# Patient Record
Sex: Female | Born: 1960
Health system: Southern US, Community
[De-identification: ages and names within clinical notes are randomized; demographics above are authoritative.]

## PROBLEM LIST (undated history)

## (undated) DIAGNOSIS — K760 Fatty (change of) liver, not elsewhere classified: Secondary | ICD-10-CM

## (undated) DIAGNOSIS — K5790 Diverticulosis of intestine, part unspecified, without perforation or abscess without bleeding: Secondary | ICD-10-CM

## (undated) DIAGNOSIS — T7840XA Allergy, unspecified, initial encounter: Secondary | ICD-10-CM

## (undated) DIAGNOSIS — E039 Hypothyroidism, unspecified: Secondary | ICD-10-CM

## (undated) DIAGNOSIS — K219 Gastro-esophageal reflux disease without esophagitis: Secondary | ICD-10-CM

## (undated) DIAGNOSIS — F32A Depression, unspecified: Secondary | ICD-10-CM

## (undated) DIAGNOSIS — E785 Hyperlipidemia, unspecified: Secondary | ICD-10-CM

## (undated) DIAGNOSIS — F419 Anxiety disorder, unspecified: Secondary | ICD-10-CM

## (undated) DIAGNOSIS — R7301 Impaired fasting glucose: Principal | ICD-10-CM

## (undated) DIAGNOSIS — F329 Major depressive disorder, single episode, unspecified: Secondary | ICD-10-CM

## (undated) DIAGNOSIS — I10 Essential (primary) hypertension: Secondary | ICD-10-CM

## (undated) DIAGNOSIS — M1990 Unspecified osteoarthritis, unspecified site: Secondary | ICD-10-CM

## (undated) DIAGNOSIS — R7303 Prediabetes: Secondary | ICD-10-CM

## (undated) DIAGNOSIS — K210 Gastro-esophageal reflux disease with esophagitis: Secondary | ICD-10-CM

## (undated) DIAGNOSIS — Z7982 Long term (current) use of aspirin: Secondary | ICD-10-CM

## (undated) HISTORY — DX: Diverticulosis of intestine, part unspecified, without perforation or abscess without bleeding: K57.90

## (undated) HISTORY — DX: Allergy, unspecified, initial encounter: T78.40XA

## (undated) HISTORY — DX: Gastro-esophageal reflux disease with esophagitis: K21.0

## (undated) HISTORY — PX: APPENDECTOMY: SHX54

## (undated) HISTORY — PX: JOINT REPLACEMENT: SHX530

## (undated) HISTORY — DX: Impaired fasting glucose: R73.01

## (undated) HISTORY — PX: THYROIDECTOMY: SHX17

## (undated) HISTORY — PX: BREAST CYST ASPIRATION: SHX578

---

## 2004-08-12 ENCOUNTER — Ambulatory Visit: Payer: Self-pay

## 2005-08-29 ENCOUNTER — Ambulatory Visit: Payer: Self-pay

## 2006-09-07 ENCOUNTER — Ambulatory Visit: Payer: Self-pay

## 2007-07-19 HISTORY — PX: ABDOMINAL HYSTERECTOMY: SHX81

## 2007-09-27 ENCOUNTER — Ambulatory Visit: Payer: Self-pay

## 2008-04-01 ENCOUNTER — Other Ambulatory Visit: Payer: Self-pay

## 2008-04-01 ENCOUNTER — Ambulatory Visit: Payer: Self-pay | Admitting: Obstetrics and Gynecology

## 2008-04-07 ENCOUNTER — Inpatient Hospital Stay: Payer: Self-pay | Admitting: Obstetrics and Gynecology

## 2008-07-18 DIAGNOSIS — K5790 Diverticulosis of intestine, part unspecified, without perforation or abscess without bleeding: Secondary | ICD-10-CM

## 2008-07-18 DIAGNOSIS — K2100 Gastro-esophageal reflux disease with esophagitis, without bleeding: Secondary | ICD-10-CM

## 2008-07-18 HISTORY — DX: Gastro-esophageal reflux disease with esophagitis, without bleeding: K21.00

## 2008-07-18 HISTORY — DX: Diverticulosis of intestine, part unspecified, without perforation or abscess without bleeding: K57.90

## 2008-10-24 ENCOUNTER — Ambulatory Visit: Payer: Self-pay | Admitting: Internal Medicine

## 2008-10-29 ENCOUNTER — Ambulatory Visit: Payer: Self-pay | Admitting: Internal Medicine

## 2009-05-06 ENCOUNTER — Ambulatory Visit: Payer: Self-pay | Admitting: Cardiovascular Disease

## 2009-05-07 ENCOUNTER — Ambulatory Visit: Payer: Self-pay | Admitting: Cardiovascular Disease

## 2009-09-07 ENCOUNTER — Ambulatory Visit: Payer: Self-pay | Admitting: Gastroenterology

## 2009-09-17 ENCOUNTER — Ambulatory Visit: Payer: Self-pay | Admitting: Internal Medicine

## 2009-12-23 ENCOUNTER — Other Ambulatory Visit: Payer: Self-pay | Admitting: Internal Medicine

## 2010-01-28 LAB — HM COLONOSCOPY

## 2010-04-01 ENCOUNTER — Encounter: Payer: Self-pay | Admitting: Physician Assistant

## 2010-06-24 ENCOUNTER — Ambulatory Visit: Payer: Self-pay | Admitting: Chiropractic Medicine

## 2010-07-07 ENCOUNTER — Ambulatory Visit: Payer: Self-pay | Admitting: Internal Medicine

## 2010-07-18 HISTORY — PX: JOINT REPLACEMENT: SHX530

## 2010-08-18 ENCOUNTER — Ambulatory Visit: Payer: Self-pay | Admitting: Pain Medicine

## 2010-08-26 ENCOUNTER — Ambulatory Visit: Payer: Self-pay | Admitting: Pain Medicine

## 2010-09-30 ENCOUNTER — Ambulatory Visit: Payer: Self-pay

## 2010-11-08 ENCOUNTER — Ambulatory Visit: Payer: Self-pay | Admitting: General Practice

## 2010-11-18 ENCOUNTER — Ambulatory Visit: Payer: Self-pay | Admitting: Internal Medicine

## 2010-11-22 ENCOUNTER — Inpatient Hospital Stay: Payer: Self-pay | Admitting: General Practice

## 2010-11-24 LAB — PATHOLOGY REPORT

## 2011-01-27 ENCOUNTER — Other Ambulatory Visit: Payer: Self-pay | Admitting: Internal Medicine

## 2011-05-06 ENCOUNTER — Other Ambulatory Visit: Payer: Self-pay | Admitting: *Deleted

## 2011-05-06 MED ORDER — ESCITALOPRAM OXALATE 20 MG PO TABS: 20.0000 mg | ORAL_TABLET | Freq: Every day | ORAL | Status: DC

## 2011-05-06 NOTE — Telephone Encounter (Signed)
Patient is asking for a 90 day supply.

## 2011-10-13 ENCOUNTER — Telehealth: Payer: Self-pay | Admitting: Internal Medicine

## 2011-10-13 NOTE — Telephone Encounter (Signed)
Since I have not seen her at the new office yet and do not have access to her old records, can you ask  her to tell me what the referral is for:  Diabetes? Overweight?  We also do not have an approiprate referral order  in the EPIC templates for this so we will need the [facility] to send [place] their blank referral forms. If they have them

## 2011-10-13 NOTE — Telephone Encounter (Signed)
[phone] Pt called stating she wanting to start going to a nurtinist @ [facility].  They told her they needed an order from you before they could see her.  Can you do a referral to the [facility]

## 2011-10-13 NOTE — Telephone Encounter (Signed)
Left message asking patient to call me back

## 2011-10-19 ENCOUNTER — Telehealth: Payer: Self-pay | Admitting: *Deleted

## 2011-10-19 NOTE — Telephone Encounter (Signed)
Opened in error

## 2011-10-19 NOTE — Telephone Encounter (Signed)
Patient notified. She is asking of this referral because she is overweight. Also the [facility] is going to be faxing over the referral form.

## 2011-11-25 ENCOUNTER — Other Ambulatory Visit: Payer: Self-pay | Admitting: Internal Medicine

## 2011-11-25 MED ORDER — CITALOPRAM HYDROBROMIDE 40 MG PO TABS: 40.0000 mg | ORAL_TABLET | Freq: Every day | ORAL | Status: DC

## 2011-11-25 MED ORDER — SPIRONOLACTONE 50 MG PO TABS: 50.0000 mg | ORAL_TABLET | Freq: Every day | ORAL | Status: DC | PRN

## 2011-12-15 ENCOUNTER — Ambulatory Visit: Payer: Self-pay | Admitting: Internal Medicine

## 2011-12-17 ENCOUNTER — Ambulatory Visit: Payer: Self-pay | Admitting: Internal Medicine

## 2011-12-29 ENCOUNTER — Ambulatory Visit: Payer: Self-pay | Admitting: Internal Medicine

## 2012-01-09 ENCOUNTER — Encounter: Payer: Self-pay | Admitting: Internal Medicine

## 2012-01-25 ENCOUNTER — Ambulatory Visit (INDEPENDENT_AMBULATORY_CARE_PROVIDER_SITE_OTHER): Payer: Self-pay | Admitting: Internal Medicine

## 2012-01-25 ENCOUNTER — Encounter: Payer: Self-pay | Admitting: Internal Medicine

## 2012-01-25 VITALS — BP 118/64 | HR 66 | Temp 97.8°F | Resp 16 | Ht 70.5 in | Wt 270.0 lb

## 2012-01-25 DIAGNOSIS — R197 Diarrhea, unspecified: Secondary | ICD-10-CM | POA: Insufficient documentation

## 2012-01-25 DIAGNOSIS — Z0000 Encounter for general adult medical examination without abnormal findings: Secondary | ICD-10-CM

## 2012-01-25 DIAGNOSIS — Z716 Tobacco abuse counseling: Secondary | ICD-10-CM

## 2012-01-25 DIAGNOSIS — R7303 Prediabetes: Secondary | ICD-10-CM | POA: Insufficient documentation

## 2012-01-25 DIAGNOSIS — Z87891 Personal history of nicotine dependence: Secondary | ICD-10-CM | POA: Insufficient documentation

## 2012-01-25 DIAGNOSIS — F17200 Nicotine dependence, unspecified, uncomplicated: Secondary | ICD-10-CM

## 2012-01-25 DIAGNOSIS — Z7189 Other specified counseling: Secondary | ICD-10-CM

## 2012-01-25 DIAGNOSIS — K5790 Diverticulosis of intestine, part unspecified, without perforation or abscess without bleeding: Secondary | ICD-10-CM

## 2012-01-25 DIAGNOSIS — R7301 Impaired fasting glucose: Secondary | ICD-10-CM

## 2012-01-25 DIAGNOSIS — K5730 Diverticulosis of large intestine without perforation or abscess without bleeding: Secondary | ICD-10-CM

## 2012-01-25 DIAGNOSIS — E669 Obesity, unspecified: Secondary | ICD-10-CM

## 2012-01-25 DIAGNOSIS — K2100 Gastro-esophageal reflux disease with esophagitis, without bleeding: Secondary | ICD-10-CM | POA: Insufficient documentation

## 2012-01-25 DIAGNOSIS — Z720 Tobacco use: Secondary | ICD-10-CM | POA: Insufficient documentation

## 2012-01-25 DIAGNOSIS — E119 Type 2 diabetes mellitus without complications: Secondary | ICD-10-CM

## 2012-01-25 HISTORY — DX: Impaired fasting glucose: R73.01

## 2012-01-25 MED ORDER — ALPRAZOLAM 0.5 MG PO TABS: 0.5000 mg | ORAL_TABLET | Freq: Three times a day (TID) | ORAL | Status: DC | PRN

## 2012-01-25 MED ORDER — DEXLANSOPRAZOLE 60 MG PO CPDR: 60.0000 mg | DELAYED_RELEASE_CAPSULE | Freq: Every day | ORAL | Status: DC

## 2012-01-25 NOTE — Assessment & Plan Note (Signed)
The patient was counseled on the dangers of tobacco use, and was advised to quit.  Reviewed strategies to maximize success, including removing cigarettes and smoking materials from environment, stress management, substitution of other forms of reinforcement and support of family/friends.

## 2012-01-25 NOTE — Assessment & Plan Note (Signed)
Pelvic and breast exam  Deferred per patient preference.  Mammogram normal June 2013.

## 2012-01-25 NOTE — Progress Notes (Signed)
Patient ID: Lisa Valencia, female   DOB: 1960-10-05, 51 y.o.   MRN: [id number] Last seen after her total hip replacement  In May 2012 by Dr. Ernest Pine.  All hip pain resolved. Limp resolved after several weeks of PT using a trainer at the [facility] gym.  Continues to work out with [name] at [employer] who addressed all of her issues.  Last PAP 2011,   No current issues,  Wants to defer PAP until next year.  Fasting labs done recently a1c was high at 6. 4 in November but she had been on steroids, and triglycerides and aLT were elevated.  She was not fasting when the lipids were drawn.  Repeated labs in November were also abnormal.  She has gained weight .   Subjective:     Lisa Valencia is a 51 y.o. female and is here for a comprehensive physical exam. The patient reports problems - with cholesterol and metabolic profile.  History   Social History  . Marital Status: Married    Spouse Name: N/A    Number of Children: N/A  . Years of Education: N/A   Occupational History  . Not on file.   Social History Main Topics  . Smoking status: Current Everyday Smoker    Types: Cigarettes  . Smokeless tobacco: Never Used  . Alcohol Use: Yes  . Drug Use: No  . Sexually Active: Not on file   Other Topics Concern  . Not on file   Social History Narrative  . No narrative on file   Health Maintenance  Topic Date Due  . Pap Smear  01/04/1979  . Tetanus/tdap  01/04/1980  . Colonoscopy  01/04/2011  . Influenza Vaccine  04/17/2012  . Mammogram  12/28/2013    The following portions of the patient's history were reviewed and updated as appropriate: allergies, current medications, past family history, past medical history, past social history, past surgical history and problem list.  Review of Systems A comprehensive review of systems was negative.   Objective:    BP 118/64  Pulse 66  Temp 97.8 F (36.6 C) (Oral)  Resp 16  Ht 5' 10.5" (1.791 m)  Wt 270 lb (122.471 kg)  BMI 38.19 kg/m2  SpO2  97% General appearance: alert, cooperative and appears stated age Head: Normocephalic, without obvious abnormality, atraumatic Eyes: conjunctivae/corneas clear. PERRL, EOM's intact. Fundi benign. Neck: no adenopathy, no carotid bruit, no JVD, supple, symmetrical, trachea midline and thyroid not enlarged, symmetric, no tenderness/mass/nodules Lungs: clear to auscultation bilaterally Heart: regular rate and rhythm, S1, S2 normal, no murmur, click, rub or gallop Abdomen: soft, non-tender; bowel sounds normal; no masses,  no organomegaly Extremities: extremities normal, atraumatic, no cyanosis or edema Pulses: 2+ and symmetric Skin: Skin color, texture, turgor normal. No rashes or lesions    Assessment:   Obesity (BMI 30-39.9) I have addressed  BMI and recommended a low glycemic index diet utilizing smaller more frequent meals to increase metabolism.  I have also recommended that patient continue exercising with a goal of 30 minutes of aerobic exercise a minimum of 5 days per week. tjhyroid function is normal.  Hgba1c and lipids are elevated and will be repeated in 3 months done today.    Tobacco abuse She had an allergic reaction  to wellbutrin on prior cessation attempt.  Prior trial of chantix was transiently successful  helped but she had a psychiatric decompensation reaction . She has tried trasnderaml nicotine.  But not the cigarette.  Husband smokes  Tobacco abuse counseling The patient was counseled on the dangers of tobacco use, and was advised to quit.  Reviewed strategies to maximize success, including removing cigarettes and smoking materials from environment, stress management, substitution of other forms of reinforcement and support of family/friends.  Diverticulosis of intestine With history concerning for recent episode, now resolved.   Esophagitis, reflux resume Dexilant for esophagitis seen on EGD.   Diabetes mellitus type 2, diet-controlled Advised to see optometry for  eye exam.  Foot check normal today . Repeat A1c in 3 months with urine test as well.   Routine general medical examination at a health care facility Pelvic and breast exam  Deferred per patient preference.  Mammogram normal June 2013.     Updated Medication List Outpatient Encounter Prescriptions as of 01/25/2012  Medication Sig Dispense Refill  . ALPRAZolam (XANAX) 0.5 MG tablet Take 1 tablet (0.5 mg total) by mouth 3 (three) times daily as needed for anxiety.  90 tablet  3  . aspirin 81 MG tablet Take 81 mg by mouth daily.      . citalopram (CELEXA) 40 MG tablet Take 1 tablet (40 mg total) by mouth daily.  30 tablet  3  . Multiple Vitamin (MULTIVITAMIN) tablet Take 1 tablet by mouth daily.      . ranitidine (ZANTAC) 150 MG capsule Take 150 mg by mouth 2 (two) times daily.      Marland Kitchen spironolactone (ALDACTONE) 50 MG tablet Take 1 tablet (50 mg total) by mouth daily as needed.  90 tablet  2  . DISCONTD: ALPRAZolam (XANAX) 0.5 MG tablet Take 0.5 mg by mouth at bedtime as needed.      Marland Kitchen dexlansoprazole (DEXILANT) 60 MG capsule Take 1 capsule (60 mg total) by mouth daily.  30 capsule  11  . DISCONTD: escitalopram (LEXAPRO) 20 MG tablet Take 1 tablet (20 mg total) by mouth daily.  90 tablet  2

## 2012-01-25 NOTE — Assessment & Plan Note (Signed)
Advised to see optometry for eye exam.  Foot check normal today . Repeat A1c in 3 months with urine test as well.

## 2012-01-25 NOTE — Assessment & Plan Note (Signed)
resume Dexilant for esophagitis seen on EGD.

## 2012-01-25 NOTE — Assessment & Plan Note (Signed)
I have addressed  BMI and recommended a low glycemic index diet utilizing smaller more frequent meals to increase metabolism.  I have also recommended that patient continue exercising with a goal of 30 minutes of aerobic exercise a minimum of 5 days per week. tjhyroid function is normal.  Hgba1c and lipids are elevated and will be repeated in 3 months done today.

## 2012-01-25 NOTE — Assessment & Plan Note (Signed)
She had an allergic reaction  to wellbutrin on prior cessation attempt.  Prior trial of chantix was transiently successful  helped but she had a psychiatric decompensation reaction . She has tried trasnderaml nicotine.  But not the cigarette.  Husband smokes

## 2012-01-25 NOTE — Assessment & Plan Note (Signed)
With history concerning for recent episode, now resolved.

## 2012-01-25 NOTE — Patient Instructions (Addendum)
Consider the Low Glycemic Index Diet and 6 smaller meals daily .  This boosts your metabolism and regulates your sugars:   7 AM Low carbohydrate Protein  Shakes (EAS Carb Control  Or Atkins ,  Available everywhere,   In  cases at BJs )  2.5 carbs  (Add or substitute a toasted sandwhich thin w/ peanut butter)  10 AM: Protein bar by Atkins (snack size,  Chocolate lover's variety at  BJ's)    Lunch: sandwich on pita bread or flatbread (Joseph's makes a pita bread and a flat bread , available at [employer] and BJ's; Toufayan makes a low carb flatbread available at [employer] and [employer]) Mission makes a low carb whole wheat tortilla available at [employer] most grocery stores   3 PM:  Mid day :  Another protein bar,  Or a  cheese stick, 1/4 cup of almonds, walnuts, pistachios, pecans, peanuts,  Macadamia nuts  6 PM  Dinner:  "mean and green:"  Meat/chicken/fish, salad, and green veggie : use ranch, vinagrette,  Blue cheese, etc  9 PM snack : Breyer's low carb fudgsicle or  ice cream bar (Carb Smart), or  Weight Watcher's ice cream bar , or another protein shake  Make substitution 15 carbs or less.  Treat pineapple as dessert, also banans and watermelon.  Cherries and berries are fine low glycemic index  Repeat you A1c, CMET and fasting lipids in 3 months.

## 2012-01-26 ENCOUNTER — Other Ambulatory Visit: Payer: Self-pay | Admitting: Internal Medicine

## 2012-01-26 MED ORDER — SPIRONOLACTONE 50 MG PO TABS: 50.0000 mg | ORAL_TABLET | Freq: Every day | ORAL | Status: DC | PRN

## 2012-01-26 MED ORDER — CITALOPRAM HYDROBROMIDE 40 MG PO TABS: 40.0000 mg | ORAL_TABLET | Freq: Every day | ORAL | Status: DC

## 2012-02-02 ENCOUNTER — Telehealth: Payer: Self-pay | Admitting: *Deleted

## 2012-02-02 NOTE — Telephone Encounter (Signed)
Pt was told by pharmacy that she needs PA completed for Dexilant. Will contact pharmacy for them to fax over information to start PA.

## 2012-02-03 NOTE — Telephone Encounter (Signed)
Called [facility] pharmacy to have them refax PA information for Dexilant to office.

## 2012-02-15 ENCOUNTER — Telehealth: Payer: Self-pay | Admitting: Internal Medicine

## 2012-02-15 NOTE — Telephone Encounter (Signed)
Patient came in today checking on prior autho for her dexialnt Pt is out of meds and would like to get sample if possbile  [facility] Please advise status

## 2012-02-15 NOTE — Telephone Encounter (Signed)
Prior auth form requested and received it is in the red folder waiting for signature.

## 2012-05-17 ENCOUNTER — Other Ambulatory Visit: Payer: Self-pay | Admitting: Internal Medicine

## 2012-05-17 LAB — COMPREHENSIVE METABOLIC PANEL
Albumin: 4.1 g/dL (ref 3.4–5.0)
Alkaline Phosphatase: 60 U/L (ref 50–136)
Anion Gap: 10 (ref 7–16)
BUN: 16 mg/dL (ref 7–18)
Bilirubin,Total: 0.3 mg/dL (ref 0.2–1.0)
Calcium, Total: 9.5 mg/dL (ref 8.5–10.1)
Chloride: 103 mmol/L (ref 98–107)
Co2: 25 mmol/L (ref 21–32)
Creatinine: 0.78 mg/dL (ref 0.60–1.30)
EGFR (African American): >60
EGFR (Non-African Amer.): >60
Glucose: 104 mg/dL — ABNORMAL HIGH (ref 65–99)
Osmolality: 277 (ref 275–301)
Potassium: 4.2 mmol/L (ref 3.5–5.1)
SGOT(AST): 19 U/L (ref 15–37)
SGPT (ALT): 35 U/L (ref 12–78)
Sodium: 138 mmol/L (ref 136–145)
Total Protein: 8 g/dL (ref 6.4–8.2)

## 2012-05-17 LAB — HEMOGLOBIN A1C: Hemoglobin A1C: 5.8 % (ref 4.2–6.3)

## 2012-05-17 LAB — LIPID PANEL
Cholesterol: 248 mg/dL — ABNORMAL HIGH (ref 0–200)
HDL Cholesterol: 40 mg/dL (ref 40–60)
Ldl Cholesterol, Calc: 160 mg/dL — ABNORMAL HIGH (ref 0–100)
Triglycerides: 238 mg/dL — ABNORMAL HIGH (ref 0–200)
VLDL Cholesterol, Calc: 48 mg/dL — ABNORMAL HIGH (ref 5–40)

## 2012-05-20 ENCOUNTER — Telehealth: Payer: Self-pay | Admitting: Internal Medicine

## 2012-05-20 NOTE — Telephone Encounter (Signed)
Labs from [facility] reviewed. Your cholesterol is too high given her diagnosis of diabetes even if his diet controlled. Her LDL is 160 in her triglycerides are 238. I would like her to consider following a low glycemic index diet for 3 months followed by repeat fasting lipids and A1c.

## 2012-05-22 NOTE — Telephone Encounter (Signed)
Called patient to give her results, left message on cell for patient to return call.

## 2012-05-24 ENCOUNTER — Encounter: Payer: Self-pay | Admitting: Internal Medicine

## 2012-05-24 NOTE — Telephone Encounter (Signed)
Called patient again and left message for her to return call.

## 2012-05-25 MED ORDER — PRAVASTATIN SODIUM 20 MG PO TABS: 20.0000 mg | ORAL_TABLET | Freq: Every day | ORAL | Status: DC

## 2012-05-25 NOTE — Telephone Encounter (Signed)
Called patient to ask about pravaststin. Patient said no thank you.

## 2012-05-25 NOTE — Telephone Encounter (Signed)
Called patient to let her know results. Patient stated that she was on the low glycemic diet for a while and she did not like it. She also states that she is watching what she eats.Patient wanted you to know that she had gotten shingles from her flu vaccine that she received from [facility] and she was out of work for two weeks.

## 2012-05-25 NOTE — Telephone Encounter (Signed)
Please ask her to consider a trial or pravastatin 20 mg daily  We would need to repeat fasting lipids and CMET along with A1c in 3 weeks. I will send rx to pharmacy

## 2012-08-28 ENCOUNTER — Encounter: Payer: Self-pay | Admitting: Internal Medicine

## 2012-08-28 ENCOUNTER — Telehealth: Payer: Self-pay | Admitting: General Practice

## 2012-08-28 ENCOUNTER — Other Ambulatory Visit: Payer: Self-pay | Admitting: Internal Medicine

## 2012-08-28 MED ORDER — PANTOPRAZOLE SODIUM 40 MG PO TBEC: 40.0000 mg | DELAYED_RELEASE_TABLET | Freq: Two times a day (BID) | ORAL | Status: DC

## 2012-08-28 NOTE — Telephone Encounter (Signed)
Dexilant copay has doubled in price. Is there anything else that is equivalent that could be called into [facility] pharmacy?

## 2012-08-28 NOTE — Telephone Encounter (Signed)
Yes, we will try protonix,  Start with one tablet daily in the morning a full 30 minutes prior to food.  May add second dose in evenign if needed rx for #60 sent to [facility]

## 2012-08-29 NOTE — Telephone Encounter (Signed)
Pt.notified

## 2012-10-24 ENCOUNTER — Other Ambulatory Visit: Payer: Self-pay | Admitting: *Deleted

## 2012-10-24 MED ORDER — SPIRONOLACTONE 50 MG PO TABS: 50.0000 mg | ORAL_TABLET | Freq: Every day | ORAL | Status: DC | PRN

## 2012-12-03 ENCOUNTER — Other Ambulatory Visit: Payer: Self-pay | Admitting: *Deleted

## 2012-12-03 NOTE — Telephone Encounter (Signed)
Ok to refill, has appt 01/28/13?

## 2012-12-25 ENCOUNTER — Ambulatory Visit: Payer: Self-pay | Admitting: General Practice

## 2013-01-01 LAB — HM MAMMOGRAPHY: HM Mammogram: NORMAL

## 2013-01-08 MED ORDER — SPIRONOLACTONE 50 MG PO TABS
50.0000 mg | ORAL_TABLET | Freq: Every day | ORAL | Status: DC | PRN
Start: 2012-12-03 — End: 2013-04-02

## 2013-01-09 ENCOUNTER — Ambulatory Visit: Payer: Self-pay | Admitting: Internal Medicine

## 2013-01-28 ENCOUNTER — Encounter: Payer: Self-pay | Admitting: Internal Medicine

## 2013-01-28 ENCOUNTER — Ambulatory Visit (INDEPENDENT_AMBULATORY_CARE_PROVIDER_SITE_OTHER): Payer: 59 | Admitting: Internal Medicine

## 2013-01-28 ENCOUNTER — Telehealth: Payer: Self-pay | Admitting: *Deleted

## 2013-01-28 VITALS — BP 128/80 | HR 71 | Temp 98.0°F | Resp 14 | Ht 69.0 in | Wt 272.5 lb

## 2013-01-28 DIAGNOSIS — Z1211 Encounter for screening for malignant neoplasm of colon: Secondary | ICD-10-CM

## 2013-01-28 DIAGNOSIS — E669 Obesity, unspecified: Secondary | ICD-10-CM

## 2013-01-28 DIAGNOSIS — Z0000 Encounter for general adult medical examination without abnormal findings: Secondary | ICD-10-CM

## 2013-01-28 DIAGNOSIS — F17200 Nicotine dependence, unspecified, uncomplicated: Secondary | ICD-10-CM

## 2013-01-28 DIAGNOSIS — E119 Type 2 diabetes mellitus without complications: Secondary | ICD-10-CM

## 2013-01-28 DIAGNOSIS — J309 Allergic rhinitis, unspecified: Secondary | ICD-10-CM

## 2013-01-28 DIAGNOSIS — Z720 Tobacco use: Secondary | ICD-10-CM

## 2013-01-28 DIAGNOSIS — Z7189 Other specified counseling: Secondary | ICD-10-CM

## 2013-01-28 DIAGNOSIS — Z716 Tobacco abuse counseling: Secondary | ICD-10-CM

## 2013-01-28 DIAGNOSIS — E785 Hyperlipidemia, unspecified: Secondary | ICD-10-CM

## 2013-01-28 LAB — LDL CHOLESTEROL, DIRECT: Direct LDL: 166.2 mg/dL

## 2013-01-28 LAB — TSH: TSH: 0.59 u[IU]/mL (ref 0.35–5.50)

## 2013-01-28 LAB — COMPREHENSIVE METABOLIC PANEL
ALT: 47 U/L — ABNORMAL HIGH (ref 0–35)
AST: 28 U/L (ref 0–37)
Albumin: 4.3 g/dL (ref 3.5–5.2)
Alkaline Phosphatase: 42 U/L (ref 39–117)
BUN: 12 mg/dL (ref 6–23)
CO2: 24 mEq/L (ref 19–32)
Calcium: 9.7 mg/dL (ref 8.4–10.5)
Chloride: 105 mEq/L (ref 96–112)
Creatinine, Ser: 0.8 mg/dL (ref 0.4–1.2)
GFR: 81.21 mL/min (ref >60.00)
Glucose, Bld: 99 mg/dL (ref 70–99)
Potassium: 4.2 mEq/L (ref 3.5–5.1)
Sodium: 137 mEq/L (ref 135–145)
Total Bilirubin: 0.5 mg/dL (ref 0.3–1.2)
Total Protein: 7.6 g/dL (ref 6.0–8.3)

## 2013-01-28 LAB — LIPID PANEL
Cholesterol: 247 mg/dL — ABNORMAL HIGH (ref 0–200)
HDL: 41.3 mg/dL (ref >39.00)
Total CHOL/HDL Ratio: 6
Triglycerides: 359 mg/dL — ABNORMAL HIGH (ref 0.0–149.0)
VLDL: 71.8 mg/dL — ABNORMAL HIGH (ref 0.0–40.0)

## 2013-01-28 LAB — HEMOGLOBIN A1C: Hgb A1c MFr Bld: 6 % (ref 4.6–6.5)

## 2013-01-28 MED ORDER — FENOFIBRATE 145 MG PO TABS: 145.0000 mg | ORAL_TABLET | Freq: Every day | ORAL | Status: DC

## 2013-01-28 NOTE — Assessment & Plan Note (Signed)
I have addressed  BMI and recommended a low glycemic index diet utilizing smaller more frequent meals to increase metabolism.  I have also recommended that patient start exercising with a goal of 30 minutes of aerobic exercise a minimum of 5 days per week.  

## 2013-01-28 NOTE — Telephone Encounter (Signed)
Pt would like to add an a1c?  

## 2013-01-28 NOTE — Patient Instructions (Addendum)
This is  Dr Melvia Heaps  version of a  "Low GI"  Diet:  It will still lower your blood sugars and allow you to lose 4 to 8  lbs  per month if you follow it carefully.  Your goal with exercise is a minimum of 30 minutes of aerobic exercise 5 days per week (Walking does not count once it becomes easy!)    All of the foods can be found at grocery stores and in bulk at [employer].  The Atkins protein bars and shakes are available in more varieties at Target, WalMart and [employer].     7 AM Breakfast:  Choose from the following:  Low carbohydrate Protein  Shakes (I recommend the EAS AdvantEdge "Carb Control" shakes  Or the low carb shakes by Atkins.    2.5 carbs   Arnold's "Sandwhich Thin"toasted  w/ peanut butter (no jelly: about 20 net carbs  "Bagel Thin" with cream cheese and salmon: about 20 carbs   a scrambled egg/bacon/cheese burrito made with Mission's "carb balance" whole wheat tortilla  (about 10 net carbs )   Avoid cereal and bananas, oatmeal and cream of wheat and grits. They are loaded with carbohydrates!   10 AM: high protein snack  Protein bar by Atkins (the snack size, under 200 cal, usually < 6 net carbs).    A stick of cheese:  Around 1 carb,  100 cal     Dannon Light n Fit [place] Yogurt  (80 cal, 8 carbs)  Other so called "protein bars" and Greek yogurts tend to be loaded with carbohydrates.  Remember, in food advertising, the word "energy" is synonymous for " carbohydrate."  Lunch:   A Sandwich using the bread choices listed, Can use any  Eggs,  lunchmeat, grilled meat or canned tuna), avocado, regular mayo/mustard  and cheese.  A Salad using blue cheese, ranch,  Goddess or vinagrette,  No croutons or "confetti" and no "candied nuts" but regular nuts OK.   No pretzels or chips.  Pickles and miniature sweet peppers are a good low carb alternative that provide a "crunch"  The bread is the only source of carbohydrate in a sandwich and  can be decreased by trying some of these alternatives  to traditional loaf bread  Joseph's makes a pita bread and a flat bread that are 50 cal and 4 net carbs available at BJs and WalMart.  This can be toasted to use with hummous as well  Toufayan makes a low carb flatbread that's 100 cal and 9 net carbs available at [employer] and [employer] makes 2 sizes of  Low carb whole wheat tortilla  (The large one is 210 cal and 6 net carbs) Avoid "Low fat dressings, as well as Reyne Dumas and [address] dressings They are loaded with sugar!   3 PM/ Mid day  Snack:  Consider  1 ounce of  almonds, walnuts, pistachios, pecans, peanuts,  Macadamia nuts or a nut medley.  Avoid "granola"; the dried cranberries and raisins are loaded with carbohydrates. Mixed nuts as long as there are no raisins,  cranberries or dried fruit.     6 PM  Dinner:     Meat/fowl/fish with a green salad, and either broccoli, cauliflower, green beans, spinach, brussel sprouts or  Lima beans. DO NOT BREAD THE PROTEIN!!      There is a low carb pasta by Dreamfield's that is acceptable and tastes great: only 5 digestible carbs/serving.( All grocery stores but BJs  carry it )  Try Kai Levins Angelo's chicken piccata or chicken or eggplant parm over low carb pasta.(Lowes and BJs)   Clifton Custard Sanchez's "Carnitas" (pulled pork, no sauce,  0 carbs) or his beef pot roast to make a dinner burrito (at BJ's)  Pesto over low carb pasta (bj's sells a good quality pesto in the center refrigerated section of the deli   Whole wheat pasta is still full of digestible carbs and  Not as low in glycemic index as Dreamfield's.   Brown rice is still rice,  So skip the rice and noodles if you eat [place] or [place] (or at least limit to 1/2 cup)  9 PM snack :   Breyer's "low carb" fudgsicle or  ice cream bar (Carb Smart line), or  Weight Watcher's ice cream bar , or another "no sugar added" ice cream;  a serving of fresh berries/cherries with whipped cream   Cheese or DANNON'S LlG[employer] [place] YOGURT  Avoid  bananas, pineapple, grapes  and watermelon on a regular basis because they are high in sugar.  THINK OF THEM AS DESSERT  Remember that snack Substitutions should be less than 10 NET carbs per serving and meals < 20 carbs. Remember to subtract fiber grams to get the "net carbs."

## 2013-01-28 NOTE — Assessment & Plan Note (Signed)
Annual comprehensive exam was done including breast, pelvic . All screenings have been addressed .

## 2013-01-28 NOTE — Progress Notes (Signed)
Patient ID: Lisa Valencia, female   DOB: [birth year], 52 y.o.   MRN: [id number]    Subjective:     Lisa Valencia is a 52 y.o. female and is here for a comprehensive physical exam. The patient reports no problems.  History   Social History  . Marital Status: Married    Spouse Name: N/A    Number of Children: N/A  . Years of Education: N/A   Occupational History  . Not on file.   Social History Main Topics  . Smoking status: Current Every Day Smoker    Types: Cigarettes  . Smokeless tobacco: Never Used  . Alcohol Use: Yes  . Drug Use: No  . Sexually Active: Not on file   Other Topics Concern  . Not on file   Social History Narrative  . No narrative on file   Health Maintenance  Topic Date Due  . Pap Smear  01/04/1979  . Tetanus/tdap  01/04/1980  . Colonoscopy  01/04/2011  . Influenza Vaccine  03/18/2013  . Mammogram  12/28/2013    The following portions of the patient's history were reviewed and updated as appropriate: allergies, current medications, past family history, past medical history, past social history, past surgical history and problem list.  Review of Systems A comprehensive review of systems was negative.   Objective:   BP 128/80  Pulse 71  Temp(Src) 98 F (36.7 C) (Oral)  Resp 14  Ht 5\' 9"  (1.753 m)  Wt 272 lb 8 oz (123.605 kg)  BMI 40.22 kg/m2  SpO2 98%  General Appearance:    Alert, cooperative, no distress, appears stated age  Head:    Normocephalic, without obvious abnormality, atraumatic  Eyes:    PERRL, conjunctiva/corneas clear, EOM's intact, fundi    benign, both eyes  Ears:    Normal TM's and external ear canals, both ears  Nose:   Nares normal, septum midline, mucosa normal, no drainage    or sinus tenderness  Throat:   Lips, mucosa, and tongue normal; teeth and gums normal  Neck:   Supple, symmetrical, trachea midline, no adenopathy;    thyroid:  no enlargement/tenderness/nodules; no carotid   bruit or JVD  Back:     Symmetric,  no curvature, ROM normal, no CVA tenderness  Lungs:     Clear to auscultation bilaterally, respirations unlabored  Chest Wall:    No tenderness or deformity   Heart:    Regular rate and rhythm, S1 and S2 normal, no murmur, rub   or gallop  Breast Exam:    No tenderness, masses, or nipple abnormality  Abdomen:     Soft, non-tender, bowel sounds active all four quadrants,    no masses, no organomegaly  Genitalia:    Pelvic: cervix normal in appearance, external genitalia normal, no adnexal masses or tenderness, no cervical motion tenderness, rectovaginal septum normal, uterus normal size, shape, and consistency and vagina normal without discharge  Extremities:   Extremities normal, atraumatic, no cyanosis or edema  Pulses:   2+ and symmetric all extremities  Skin:   Skin color, texture, turgor normal, no rashes or lesions  Lymph nodes:   Cervical, supraclavicular, and axillary nodes normal  Neurologic:   CNII-XII intact, normal strength, sensation and reflexes    throughout   n Assessment:   Subjective:     Lisa Valencia is a 52 y.o. female and is here for a comprehensive physical exam. The patient reports no problems.  History   Social History  . Marital  Status: Married    Spouse Name: N/A    Number of Children: N/A  . Years of Education: N/A   Occupational History  . Not on file.   Social History Main Topics  . Smoking status: Current Every Day Smoker    Types: Cigarettes  . Smokeless tobacco: Never Used  . Alcohol Use: Yes  . Drug Use: No  . Sexually Active: Not on file   Other Topics Concern  . Not on file   Social History Narrative  . No narrative on file   Health Maintenance  Topic Date Due  . Pap Smear  01/04/1979  . Tetanus/tdap  01/04/1980  . Colonoscopy  01/04/2011  . Influenza Vaccine  03/18/2013  . Mammogram  12/28/2013    The following portions of the patient's history were reviewed and updated as appropriate: allergies, current medications, past  family history, past medical history, past social history, past surgical history and problem list.  Review of Systems A comprehensive review of systems was negative.   Objective:  BP 128/80  Pulse 71  Temp(Src) 98 F (36.7 C) (Oral)  Resp 14  Ht 5\' 9"  (1.753 m)  Wt 272 lb 8 oz (123.605 kg)  BMI 40.22 kg/m2  SpO2 98%  General Appearance:    Alert, cooperative, no distress, appears stated age  Head:    Normocephalic, without obvious abnormality, atraumatic  Eyes:    PERRL, conjunctiva/corneas clear, EOM's intact, fundi    benign, both eyes  Ears:    Normal TM's and external ear canals, both ears  Nose:   Nares normal, septum midline, mucosa normal, no drainage    or sinus tenderness  Throat:   Lips, mucosa, and tongue normal; teeth and gums normal  Neck:   Supple, symmetrical, trachea midline, no adenopathy;    thyroid:  no enlargement/tenderness/nodules; no carotid   bruit or JVD  Back:     Symmetric, no curvature, ROM normal, no CVA tenderness  Lungs:     Clear to auscultation bilaterally, respirations unlabored  Chest Wall:    No tenderness or deformity   Heart:    Regular rate and rhythm, S1 and S2 normal, no murmur, rub   or gallop  Breast Exam:    No tenderness, masses, or nipple abnormality  Abdomen:     Soft, non-tender, bowel sounds active all four quadrants,    no masses, no organomegaly  Genitalia:    Pelvic: cervix normal in appearance, external genitalia normal, no adnexal masses or tenderness, no cervical motion tenderness, rectovaginal septum normal, uterus normal size, shape, and consistency and vagina normal without discharge  Extremities:   Extremities normal, atraumatic, no cyanosis or edema  Pulses:   2+ and symmetric all extremities  Skin:   Skin color, texture, turgor normal, no rashes or lesions  Lymph nodes:   Cervical, supraclavicular, and axillary nodes normal  Neurologic:   CNII-XII intact, normal strength, sensation and reflexes    throughout     Assessment:   Routine general medical examination at a health care facility Annual comprehensive exam was done including breast, pelvic . All screenings have been addressed .   Diabetes mellitus type 2 in obese Well controlled on diet.  hgba1c is 6.0 . Reminder for eye exam given. . Foot exam normal.  Cholesterol is very and will need treatment given triglycerides > 300  Obesity (BMI 30-39.9) I have addressed  BMI and recommended a low glycemic index diet utilizing smaller more frequent meals to increase metabolism.  I have also recommended that patient start exercising with a goal of 30 minutes of aerobic exercise a minimum of 5 days per week.   Tobacco abuse counseling Smoking cessation instruction/counseling given:  counseled patient on the dangers of tobacco use, advised patient to stop smoking, and reviewed strategies to maximize success  Tobacco abuse She had an allergic reaction  to wellbutrin on prior cessation attempt.  Prior trial of chantix was transiently successful  helped but she had a psychiatric decompensation reaction . She has tried transdermal nicotine.  But not the E vapor.  Husband smokes    Other and unspecified hyperlipidemia triglycerides 39, LDL 166. Recommending fenofibrate/    Updated Medication List Outpatient Encounter Prescriptions as of 01/28/2013  Medication Sig Dispense Refill  . ALPRAZolam (XANAX) 0.5 MG tablet Take 1 tablet (0.5 mg total) by mouth 3 (three) times daily as needed for anxiety.  90 tablet  3  . cetirizine (ZYRTEC) 10 MG tablet Take 10 mg by mouth daily.      . Multiple Vitamin (MULTIVITAMIN) tablet Take 1 tablet by mouth daily.      . pantoprazole (PROTONIX) 40 MG tablet Take 1 tablet (40 mg total) by mouth 2 (two) times daily.  60 tablet  3  . spironolactone (ALDACTONE) 50 MG tablet Take 1 tablet (50 mg total) by mouth daily as needed.  30 tablet  1  . aspirin 81 MG tablet Take 81 mg by mouth daily.      . citalopram (CELEXA) 40 MG  tablet Take 1 tablet (40 mg total) by mouth daily.  90 tablet  3  . fenofibrate (TRICOR) 145 MG tablet Take 1 tablet (145 mg total) by mouth daily.  90 tablet  3  . ranitidine (ZANTAC) 150 MG capsule Take 150 mg by mouth 2 (two) times daily.      . [DISCONTINUED] dexlansoprazole (DEXILANT) 60 MG capsule Take 1 capsule (60 mg total) by mouth daily.  30 capsule  11   No facility-administered encounter medications on file as of 01/28/2013.

## 2013-01-28 NOTE — Assessment & Plan Note (Signed)
Smoking cessation instruction/counseling given:  counseled patient on the dangers of tobacco use, advised patient to stop smoking, and reviewed strategies to maximize success 

## 2013-01-28 NOTE — Assessment & Plan Note (Signed)
She had an allergic reaction  to wellbutrin on prior cessation attempt.  Prior trial of chantix was transiently successful  helped but she had a psychiatric decompensation reaction . She has tried transdermal nicotine.  But not the E vapor.  Husband smokes

## 2013-01-28 NOTE — Assessment & Plan Note (Addendum)
Well controlled on diet.  hgba1c is 6.0 . Reminder for eye exam given. . Foot exam normal.  Cholesterol is very and will need treatment given triglycerides > 300

## 2013-01-28 NOTE — Assessment & Plan Note (Signed)
triglycerides 39, LDL 166. Recommending fenofibrate/

## 2013-01-31 ENCOUNTER — Encounter: Payer: Self-pay | Admitting: Internal Medicine

## 2013-02-01 ENCOUNTER — Other Ambulatory Visit: Payer: Self-pay | Admitting: *Deleted

## 2013-02-01 MED ORDER — ALPRAZOLAM 0.5 MG PO TABS: 0.5000 mg | ORAL_TABLET | Freq: Three times a day (TID) | ORAL | Status: DC | PRN

## 2013-02-01 MED ORDER — CITALOPRAM HYDROBROMIDE 40 MG PO TABS: 40.0000 mg | ORAL_TABLET | Freq: Every day | ORAL | Status: DC

## 2013-02-01 MED ORDER — PANTOPRAZOLE SODIUM 40 MG PO TBEC: 40.0000 mg | DELAYED_RELEASE_TABLET | Freq: Two times a day (BID) | ORAL | Status: DC

## 2013-02-01 NOTE — Telephone Encounter (Signed)
Refill? Pt just had OV.

## 2013-02-01 NOTE — Telephone Encounter (Signed)
Ok to refill for 30 days only , I   printed rx , but please find out if she is using the alprazolam three times daily bc if so I would like to talk to her about substiuting something safer and she'll need to follow up in 1 month instead of 3

## 2013-02-07 ENCOUNTER — Encounter: Payer: Self-pay | Admitting: Internal Medicine

## 2013-03-01 ENCOUNTER — Encounter: Payer: Self-pay | Admitting: Internal Medicine

## 2013-03-28 ENCOUNTER — Telehealth: Payer: Self-pay | Admitting: Internal Medicine

## 2013-03-28 NOTE — Telephone Encounter (Signed)
Pt states she is a Engineer, civil (consulting) at [employer] and they are due to receive flu shots next week at work.  Pt states she has always received the flu vaccine but last year had shingles after receiving the flu vaccine, so does not wish to get it again.  Pt states she needs a note from her doctor stating that she does not have to receive this vaccine.  Pt asking to fax the note to [phone].   Please contact pt with any questions and to confirm that letter has been sent.

## 2013-03-29 NOTE — Telephone Encounter (Signed)
i cannot do that,  The flu shot is a killed vaccine, and will not cause the shingles episode

## 2013-04-01 NOTE — Telephone Encounter (Signed)
The patient was informed.

## 2013-04-02 ENCOUNTER — Other Ambulatory Visit: Payer: Self-pay | Admitting: *Deleted

## 2013-04-02 MED ORDER — SPIRONOLACTONE 50 MG PO TABS: 50.0000 mg | ORAL_TABLET | Freq: Every day | ORAL | Status: DC | PRN

## 2013-04-02 NOTE — Telephone Encounter (Signed)
Eprescribed.

## 2013-04-26 ENCOUNTER — Encounter: Payer: Self-pay | Admitting: *Deleted

## 2013-04-29 ENCOUNTER — Ambulatory Visit (INDEPENDENT_AMBULATORY_CARE_PROVIDER_SITE_OTHER): Payer: 59 | Admitting: Internal Medicine

## 2013-04-29 ENCOUNTER — Encounter: Payer: Self-pay | Admitting: Internal Medicine

## 2013-04-29 VITALS — BP 118/70 | HR 63 | Temp 97.7°F | Resp 12 | Ht 69.0 in | Wt 265.5 lb

## 2013-04-29 DIAGNOSIS — E1169 Type 2 diabetes mellitus with other specified complication: Secondary | ICD-10-CM

## 2013-04-29 DIAGNOSIS — E785 Hyperlipidemia, unspecified: Secondary | ICD-10-CM

## 2013-04-29 DIAGNOSIS — R5381 Other malaise: Secondary | ICD-10-CM

## 2013-04-29 DIAGNOSIS — Z79899 Other long term (current) drug therapy: Secondary | ICD-10-CM

## 2013-04-29 DIAGNOSIS — F411 Generalized anxiety disorder: Secondary | ICD-10-CM

## 2013-04-29 DIAGNOSIS — R739 Hyperglycemia, unspecified: Secondary | ICD-10-CM

## 2013-04-29 DIAGNOSIS — E119 Type 2 diabetes mellitus without complications: Secondary | ICD-10-CM

## 2013-04-29 DIAGNOSIS — M5430 Sciatica, unspecified side: Secondary | ICD-10-CM

## 2013-04-29 DIAGNOSIS — E669 Obesity, unspecified: Secondary | ICD-10-CM

## 2013-04-29 DIAGNOSIS — R7309 Other abnormal glucose: Secondary | ICD-10-CM

## 2013-04-29 LAB — COMPREHENSIVE METABOLIC PANEL
ALT: 25 U/L (ref 0–35)
AST: 24 U/L (ref 0–37)
Albumin: 4.9 g/dL (ref 3.5–5.2)
Alkaline Phosphatase: 31 U/L — ABNORMAL LOW (ref 39–117)
BUN: 22 mg/dL (ref 6–23)
CO2: 25 mEq/L (ref 19–32)
Calcium: 10.4 mg/dL (ref 8.4–10.5)
Chloride: 102 mEq/L (ref 96–112)
Creatinine, Ser: 0.9 mg/dL (ref 0.4–1.2)
GFR: 71.63 mL/min (ref >60.00)
Glucose, Bld: 104 mg/dL — ABNORMAL HIGH (ref 70–99)
Potassium: 4.4 mEq/L (ref 3.5–5.1)
Sodium: 139 mEq/L (ref 135–145)
Total Bilirubin: 0.6 mg/dL (ref 0.3–1.2)
Total Protein: 7.8 g/dL (ref 6.0–8.3)

## 2013-04-29 LAB — CBC WITH DIFFERENTIAL/PLATELET
Basophils Absolute: 0 10*3/uL (ref 0.0–0.1)
Basophils Relative: 0.4 % (ref 0.0–3.0)
Eosinophils Absolute: 0.2 10*3/uL (ref 0.0–0.7)
Eosinophils Relative: 3.2 % (ref 0.0–5.0)
HCT: 42 % (ref 36.0–46.0)
Hemoglobin: 14.3 g/dL (ref 12.0–15.0)
Lymphocytes Relative: 36.2 % (ref 12.0–46.0)
Lymphs Abs: 2.3 10*3/uL (ref 0.7–4.0)
MCHC: 34.1 g/dL (ref 30.0–36.0)
MCV: 86.4 fl (ref 78.0–100.0)
Monocytes Absolute: 0.3 10*3/uL (ref 0.1–1.0)
Monocytes Relative: 5.2 % (ref 3.0–12.0)
Neutro Abs: 3.5 10*3/uL (ref 1.4–7.7)
Neutrophils Relative %: 55 % (ref 43.0–77.0)
Platelets: 228 10*3/uL (ref 150.0–400.0)
RBC: 4.86 Mil/uL (ref 3.87–5.11)
RDW: 13.4 % (ref 11.5–14.6)
WBC: 6.3 10*3/uL (ref 4.5–10.5)

## 2013-04-29 LAB — LIPID PANEL
Cholesterol: 231 mg/dL — ABNORMAL HIGH (ref 0–200)
HDL: 52.5 mg/dL (ref >39.00)
Total CHOL/HDL Ratio: 4
Triglycerides: 260 mg/dL — ABNORMAL HIGH (ref 0.0–149.0)
VLDL: 52 mg/dL — ABNORMAL HIGH (ref 0.0–40.0)

## 2013-04-29 LAB — TSH: TSH: 0.52 u[IU]/mL (ref 0.35–5.50)

## 2013-04-29 LAB — HEMOGLOBIN A1C: Hgb A1c MFr Bld: 6.1 % (ref 4.6–6.5)

## 2013-04-29 LAB — LDL CHOLESTEROL, DIRECT: Direct LDL: 153.8 mg/dL

## 2013-04-29 MED ORDER — FENOFIBRATE 145 MG PO TABS: 145.0000 mg | ORAL_TABLET | Freq: Every day | ORAL | Status: DC

## 2013-04-29 MED ORDER — PANTOPRAZOLE SODIUM 40 MG PO TBEC: 40.0000 mg | DELAYED_RELEASE_TABLET | Freq: Two times a day (BID) | ORAL | Status: DC

## 2013-04-29 MED ORDER — TRAMADOL HCL 50 MG PO TABS: ORAL_TABLET | ORAL | Status: DC

## 2013-04-29 MED ORDER — CITALOPRAM HYDROBROMIDE 40 MG PO TABS: 40.0000 mg | ORAL_TABLET | Freq: Every day | ORAL | Status: DC

## 2013-04-29 MED ORDER — ALPRAZOLAM 0.5 MG PO TABS: 0.5000 mg | ORAL_TABLET | Freq: Three times a day (TID) | ORAL | Status: DC | PRN

## 2013-04-29 NOTE — Assessment & Plan Note (Addendum)
Secondary to disk herniation by recent ,]lumbar MRI> Trial of tramadol,  Up to 6 daily

## 2013-04-29 NOTE — Progress Notes (Signed)
Patient ID: Lisa Valencia, female   DOB: October 19, 1960, 52 y.o.   MRN: [id number]   Patient Active Problem List   Diagnosis Date Noted  . Sciatica 04/29/2013  . Other and unspecified hyperlipidemia 01/28/2013  . Obesity (BMI 30-39.9) 01/25/2012  . Drug-induced hyperglycemia 01/25/2012  . Routine general medical examination at a health care facility 01/25/2012  . Tobacco abuse 01/25/2012  . Tobacco abuse counseling 01/25/2012  . Esophagitis, reflux   . Diverticulosis of intestine     Subjective:  CC:   Chief Complaint  Patient presents with  . Follow-up    medication and check lab for cholesterol    HPI:   Lisa Valencia a 52 y.o. female who presents for follow up on hyperlipidemia and obesity.  Since her last visit she has lost  7 lbs,  10 lbs  per her scales , on the low glycemic index diet.   She has been having persistent back pain since a back injury which occurred over a year ago.  Initially her her pain improved after receiving 2 epidural injections.  Each one costed her $900 out of pocket, but her pain has returned to involve the hip and leg.  She has had an orthopedic evaluation by Dr. Ernest Pine,  He ordered an MRI of the lumbar spine and stated that her pain is due to a herniated disk.   She has been taking up to 2400 mg ibuprofen daily and still in pain  Pain rediates to groon and knee.  She has an L3 disk protrsuion by June  lumbar MRI    Past Medical History  Diagnosis Date  . Esophagitis, reflux 2010     EGD by iftkihar, managed wiht dexilant  . Diverticulosis of intestine 2010    iftikhar    Past Surgical History  Procedure Laterality Date  . Joint replacement  2012    hip, [name]  . Appendectomy    . Abdominal hysterectomy  2009    supracervical        The following portions of the patient's history were reviewed and updated as appropriate: Allergies, current medications, and problem list.    Review of Systems:   Patient denies headache, fevers,  malaise, unintentional weight loss, skin rash, eye pain, sinus congestion and sinus pain, sore throat, dysphagia,  hemoptysis , cough, dyspnea, wheezing, chest pain, palpitations, orthopnea, edema, abdominal pain, nausea, melena, diarrhea, constipation, flank pain, dysuria, hematuria, urinary  Frequency, nocturia, numbness, tingling, seizures,  Focal weakness, Loss of consciousness,  Tremor, insomnia, depression, anxiety, and suicidal ideation.     History   Social History  . Marital Status: Married    Spouse Name: N/A    Number of Children: N/A  . Years of Education: N/A   Occupational History  . Not on file.   Social History Main Topics  . Smoking status: Current Every Day Smoker    Types: Cigarettes  . Smokeless tobacco: Never Used  . Alcohol Use: Yes  . Drug Use: No  . Sexual Activity: Not on file   Other Topics Concern  . Not on file   Social History Narrative  . No narrative on file    Objective:  Filed Vitals:   04/29/13 0916  BP: 118/70  Pulse: 63  Temp: 97.7 F (36.5 C)  Resp: 12     General appearance: alert, cooperative and appears stated age Ears: normal TM's and external ear canals both ears Throat: lips, mucosa, and tongue normal; teeth and gums normal Neck: no  adenopathy, no carotid bruit, supple, symmetrical, trachea midline and thyroid not enlarged, symmetric, no tenderness/mass/nodules Back: symmetric, no curvature. ROM normal. No CVA tenderness. Lungs: clear to auscultation bilaterally Heart: regular rate and rhythm, S1, S2 normal, no murmur, click, rub or gallop Abdomen: soft, non-tender; bowel sounds normal; no masses,  no organomegaly Pulses: 2+ and symmetric Skin: Skin color, texture, turgor normal. No rashes or lesions Lymph nodes: Cervical, supraclavicular, and axillary nodes normal.  Assessment and Plan:  Drug-induced hyperglycemia She had a fasting glucose of 160 while on a prednisone,  And has checked it multiple times since then and  has had none that high since .  Lately has bee 110.  Sciatica Secondary to disk herniation by recent ,]lumbar MRI> Trial of tramadol,  Up to 6 daily   Obesity (BMI 30-39.9) I have congratulated her on the weight loss .  I have addressed  BMI and recommended wt loss of 10% of body weigh over the next 6 months using a low glycemic index diet and regular exercise a minimum of 5 days per week.    Other and unspecified hyperlipidemia Improved triglycerides on current regimen   Updated Medication List Outpatient Encounter Prescriptions as of 04/29/2013  Medication Sig Dispense Refill  . cetirizine (ZYRTEC) 10 MG tablet Take 10 mg by mouth daily.      . citalopram (CELEXA) 40 MG tablet Take 1 tablet (40 mg total) by mouth daily.  90 tablet  3  . fenofibrate (TRICOR) 145 MG tablet Take 1 tablet (145 mg total) by mouth daily.  90 tablet  3  . Multiple Vitamin (MULTIVITAMIN) tablet Take 1 tablet by mouth daily.      . pantoprazole (PROTONIX) 40 MG tablet Take 1 tablet (40 mg total) by mouth 2 (two) times daily.  60 tablet  11  . spironolactone (ALDACTONE) 50 MG tablet Take 1 tablet (50 mg total) by mouth daily as needed.  30 tablet  5  . [DISCONTINUED] citalopram (CELEXA) 40 MG tablet Take 1 tablet (40 mg total) by mouth daily.  90 tablet  3  . [DISCONTINUED] fenofibrate (TRICOR) 145 MG tablet Take 1 tablet (145 mg total) by mouth daily.  90 tablet  3  . [DISCONTINUED] pantoprazole (PROTONIX) 40 MG tablet Take 1 tablet (40 mg total) by mouth 2 (two) times daily.  60 tablet  11  . ALPRAZolam (XANAX) 0.5 MG tablet Take 1 tablet (0.5 mg total) by mouth 3 (three) times daily as needed for anxiety.  90 tablet  0  . aspirin 81 MG tablet Take 81 mg by mouth daily.      . traMADol (ULTRAM) 50 MG tablet 1 to 2  Tramadol every 6 hours as needed for severe back pain  maxiumum 6 daily  180 tablet  3  . [DISCONTINUED] ALPRAZolam (XANAX) 0.5 MG tablet Take 1 tablet (0.5 mg total) by mouth 3 (three) times daily  as needed for anxiety.  90 tablet  0  . [DISCONTINUED] ranitidine (ZANTAC) 150 MG capsule Take 150 mg by mouth 2 (two) times daily.       No facility-administered encounter medications on file as of 04/29/2013.

## 2013-04-29 NOTE — Progress Notes (Signed)
Called in to the [facility] employee pharmacy

## 2013-04-29 NOTE — Assessment & Plan Note (Signed)
I have congratulated her on the weight loss .  I have addressed  BMI and recommended wt loss of 10% of body weigh over the next 6 months using a low glycemic index diet and regular exercise a minimum of 5 days per week.

## 2013-04-29 NOTE — Progress Notes (Signed)
Called into [facility] pharmacy.  

## 2013-04-29 NOTE — Assessment & Plan Note (Addendum)
She had a fasting glucose of 160 while on a prednisone,  And has checked it multiple times since then and has had none that high since .  Lately has bee 110.

## 2013-04-29 NOTE — Assessment & Plan Note (Signed)
Improved triglycerides on current regimen

## 2013-04-30 ENCOUNTER — Telehealth: Payer: Self-pay | Admitting: Internal Medicine

## 2013-04-30 ENCOUNTER — Encounter: Payer: Self-pay | Admitting: Internal Medicine

## 2013-04-30 NOTE — Telephone Encounter (Signed)
Please call these two medications into [facility] pharmacy.   ALPRAZolam (XANAX) 0.5 MG tablet   traMADol (ULTRAM) 50 MG tablet

## 2013-04-30 NOTE — Telephone Encounter (Signed)
Rs faxed to pharmacy

## 2013-05-16 ENCOUNTER — Encounter: Payer: Self-pay | Admitting: Internal Medicine

## 2013-05-17 ENCOUNTER — Encounter: Payer: Self-pay | Admitting: Internal Medicine

## 2013-05-23 ENCOUNTER — Other Ambulatory Visit: Payer: Self-pay

## 2013-10-07 ENCOUNTER — Other Ambulatory Visit: Payer: Self-pay | Admitting: Internal Medicine

## 2014-02-17 ENCOUNTER — Other Ambulatory Visit: Payer: Self-pay | Admitting: Internal Medicine

## 2014-02-17 NOTE — Telephone Encounter (Signed)
Appt 03/05/14

## 2014-02-19 ENCOUNTER — Ambulatory Visit: Payer: Self-pay | Admitting: Internal Medicine

## 2014-02-19 ENCOUNTER — Encounter: Payer: Self-pay | Admitting: *Deleted

## 2014-02-19 LAB — HM MAMMOGRAPHY: HM Mammogram: NEGATIVE

## 2014-03-05 ENCOUNTER — Ambulatory Visit (INDEPENDENT_AMBULATORY_CARE_PROVIDER_SITE_OTHER): Payer: 59 | Admitting: Internal Medicine

## 2014-03-05 ENCOUNTER — Encounter: Payer: Self-pay | Admitting: Internal Medicine

## 2014-03-05 VITALS — BP 128/78 | HR 79 | Temp 98.0°F | Resp 16 | Ht 70.0 in | Wt 277.5 lb

## 2014-03-05 DIAGNOSIS — E785 Hyperlipidemia, unspecified: Secondary | ICD-10-CM

## 2014-03-05 DIAGNOSIS — M5430 Sciatica, unspecified side: Secondary | ICD-10-CM

## 2014-03-05 DIAGNOSIS — Z716 Tobacco abuse counseling: Secondary | ICD-10-CM

## 2014-03-05 DIAGNOSIS — Z0000 Encounter for general adult medical examination without abnormal findings: Secondary | ICD-10-CM

## 2014-03-05 DIAGNOSIS — Z90711 Acquired absence of uterus with remaining cervical stump: Secondary | ICD-10-CM | POA: Insufficient documentation

## 2014-03-05 DIAGNOSIS — T50904A Poisoning by unspecified drugs, medicaments and biological substances, undetermined, initial encounter: Secondary | ICD-10-CM

## 2014-03-05 DIAGNOSIS — Z720 Tobacco use: Secondary | ICD-10-CM

## 2014-03-05 DIAGNOSIS — Z90710 Acquired absence of both cervix and uterus: Secondary | ICD-10-CM

## 2014-03-05 DIAGNOSIS — F17200 Nicotine dependence, unspecified, uncomplicated: Secondary | ICD-10-CM

## 2014-03-05 DIAGNOSIS — K2100 Gastro-esophageal reflux disease with esophagitis, without bleeding: Secondary | ICD-10-CM

## 2014-03-05 DIAGNOSIS — E669 Obesity, unspecified: Secondary | ICD-10-CM

## 2014-03-05 DIAGNOSIS — Z7189 Other specified counseling: Secondary | ICD-10-CM

## 2014-03-05 DIAGNOSIS — R7309 Other abnormal glucose: Secondary | ICD-10-CM

## 2014-03-05 DIAGNOSIS — T50905A Adverse effect of unspecified drugs, medicaments and biological substances, initial encounter: Secondary | ICD-10-CM

## 2014-03-05 DIAGNOSIS — R739 Hyperglycemia, unspecified: Secondary | ICD-10-CM

## 2014-03-05 MED ORDER — CELECOXIB 200 MG PO CAPS: 200.0000 mg | ORAL_CAPSULE | Freq: Two times a day (BID) | ORAL | Status: DC

## 2014-03-05 MED ORDER — METHOCARBAMOL 750 MG PO TABS: 750.0000 mg | ORAL_TABLET | Freq: Four times a day (QID) | ORAL | Status: DC

## 2014-03-05 MED ORDER — NICOTINE 21 MG/24HR TD PT24: 21.0000 mg | MEDICATED_PATCH | Freq: Every day | TRANSDERMAL | Status: DC

## 2014-03-05 MED ORDER — TRAMADOL HCL 50 MG PO TABS: ORAL_TABLET | ORAL | Status: DC

## 2014-03-05 MED ORDER — ESTROGENS, CONJUGATED 0.625 MG/GM VA CREA
1.0000 | TOPICAL_CREAM | Freq: Every day | VAGINAL | Status: DC
Start: 2014-03-05 — End: 2016-02-24

## 2014-03-05 MED ORDER — SPIRONOLACTONE 50 MG PO TABS: ORAL_TABLET | ORAL | Status: DC

## 2014-03-05 NOTE — Progress Notes (Signed)
Patient ID: Lisa Valencia, female   DOB: 1961/03/13, 53 y.o.   MRN: [id number]   Subjective:     Lisa Valencia is a 53 y.o. female and is here for a comprehensive physical exam. The patient reports problems - as defined below:.  She saw [facility] for back pain due to herniation of disk with no response to epidurals.  Fish oil with DHA was recommended, and since then her back pain has improved   recurrent dizziness, headache for two weeks,  Ears look fine,  Not lavaging .   Has had infectiions in the past .  Swims in a chlorinated pool on the weekends.     History   Social History  . Marital Status: Married    Spouse Name: N/A    Number of Children: N/A  . Years of Education: N/A   Occupational History  . Not on file.   Social History Main Topics  . Smoking status: Current Every Day Smoker    Types: Cigarettes  . Smokeless tobacco: Never Used  . Alcohol Use: Yes  . Drug Use: No  . Sexual Activity: Not on file   Other Topics Concern  . Not on file   Social History Narrative  . No narrative on file   Health Maintenance  Topic Date Due  . Pneumococcal Polysaccharide Vaccine (##1) 01/04/1963  . Foot Exam  01/04/1971  . Ophthalmology Exam  01/04/1971  . Urine Microalbumin  01/04/1971  . Pap Smear  01/04/1979  . Hemoglobin A1c  10/28/2013  . Influenza Vaccine  02/15/2014  . Mammogram  02/20/2016  . Colonoscopy  01/29/2020  . Tetanus/tdap  07/01/2020    The following portions of the patient's history were reviewed and updated as appropriate: allergies, current medications, past family history, past medical history, past social history, past surgical history and problem list.  Review of Systems A comprehensive review of systems was negative except for: Ears, nose, mouth, throat, and face: positive for nasal congestion   Objective:    BP 128/78  Pulse 79  Temp(Src) 98 F (36.7 C) (Oral)  Resp 16  Ht 5\' 10"  (1.778 m)  Wt 277 lb 8 oz (125.873 kg)  BMI  39.82 kg/m2  SpO2 96%  General appearance: alert, cooperative and appears stated age Head: Normocephalic, without obvious abnormality, atraumatic Eyes: conjunctivae/corneas clear. PERRL, EOM's intact. Fundi benign. Ears: normal TM's and external ear canals both ears Nose: Nares normal. Septum midline. Mucosa normal. No drainage or sinus tenderness. Throat: lips, mucosa, and tongue normal; teeth and gums normal Neck: no adenopathy, no carotid bruit, no JVD, supple, symmetrical, trachea midline and thyroid not enlarged, symmetric, no tenderness/mass/nodules Lungs: clear to auscultation bilaterally Breasts: normal appearance, no masses or tenderness Heart: regular rate and rhythm, S1, S2 normal, no murmur, click, rub or gallop Abdomen: soft, non-tender; bowel sounds normal; no masses,  no organomegaly Extremities: extremities normal, atraumatic, no cyanosis or edema Pulses: 2+ and symmetric Skin: Skin color, texture, turgor normal. No rashes or lesions Neurologic: Alert and oriented X 3, normal strength and tone. Normal symmetric reflexes. Normal coordination and gait.    Assessment and Plan:   Esophagitis, reflux Controlled with daily protonix  Sciatica Improved pain with use of fish oil  Obesity (BMI 30-39.9) I have addressed  BMI and recommended a low glycemic index diet utilizing smaller more frequent meals to increase metabolism.  I have also recommended that patient start exercising with a goal of 30 minutes of aerobic exercise a minimum  of 5 days per week. Screening for lipid disorders, thyroid and diabetes was doen Oct 2014 and will be repeated.  Lab Results  Component Value Date   TSH 0.52 04/29/2013   Lab Results  Component Value Date   HGBA1C 6.1 04/29/2013     Impaired fasting glucose Encouraged weigth loss, low glycemic index diet.  Return for fasting labs.   Other and unspecified hyperlipidemia She did not tolerate Tricor due to moderate myalgias.  Will repeat   triglycerides on current regimen of low GI diet and fish oil     Encounter for preventive health examination Annual comprehensive physical exam and management of acute and chronic conditions .  Health maintenance screenings have ben addressed and hand out given .   Tobacco abuse counseling Smoking cessation instruction/counseling given:  counseled patient on the dangers of tobacco use, advised patient to stop smoking, and reviewed strategies to maximize success.  Nicoderm patches offered    Updated Medication List Outpatient Encounter Prescriptions as of 03/05/2014  Medication Sig  . ALPRAZolam (XANAX) 0.5 MG tablet Take 1 tablet (0.5 mg total) by mouth 3 (three) times daily as needed for anxiety.  Marland Kitchen aspirin 81 MG tablet Take 81 mg by mouth daily.  . cetirizine (ZYRTEC) 10 MG tablet Take 10 mg by mouth daily.  . citalopram (CELEXA) 40 MG tablet Take 1 tablet (40 mg total) by mouth daily.  . Multiple Vitamin (MULTIVITAMIN) tablet Take 1 tablet by mouth daily.  . Omega-3 Fatty Acids (FISH OIL) 1000 MG CAPS Take 1 capsule by mouth daily.  . pantoprazole (PROTONIX) 40 MG tablet Take 1 tablet (40 mg total) by mouth 2 (two) times daily.  Marland Kitchen spironolactone (ALDACTONE) 50 MG tablet TAKE ONE TABLET BY MOUTH DAILY AS NEEDED  . [DISCONTINUED] spironolactone (ALDACTONE) 50 MG tablet TAKE ONE TABLET BY MOUTH DAILY AS NEEDED  . celecoxib (CELEBREX) 200 MG capsule Take 1 capsule (200 mg total) by mouth 2 (two) times daily.  Marland Kitchen conjugated estrogens (PREMARIN) vaginal cream Place 1 Applicatorful vaginally daily.  . methocarbamol (ROBAXIN-750) 750 MG tablet Take 1 tablet (750 mg total) by mouth 4 (four) times daily. As needed for muscle spasm  . nicotine (NICODERM CQ) 21 mg/24hr patch Place 1 patch (21 mg total) onto the skin daily.  . traMADol (ULTRAM) 50 MG tablet 1 to 2  Tramadol every 6 hours as needed for severe back pain  maxiumum 6 daily  . [DISCONTINUED] fenofibrate (TRICOR) 145 MG tablet Take 1  tablet (145 mg total) by mouth daily.  . [DISCONTINUED] traMADol (ULTRAM) 50 MG tablet 1 to 2  Tramadol every 6 hours as needed for severe back pain  maxiumum 6 daily

## 2014-03-05 NOTE — Patient Instructions (Addendum)
This is  my version of a  "Low GI"  Diet:  It will still lower your blood sugars and allow you to lose 4 to 8  lbs  per month if you follow it carefully.  Your goal with exercise is a minimum of 30 minutes of aerobic exercise 5 days per week (Walking does not count once it becomes easy!)      All of the foods can be found at grocery stores and in bulk at [employer].  The Atkins protein bars and shakes are available in more varieties at [employer], [employer] and [employer].     7 AM Breakfast:  Choose from the following:  Low carbohydrate Protein  Shakes (I recommend the EAS AdvantEdge "Carb Control" shakes  Or the low carb shakes by Atkins.    2.5 carbs   Arnold's "Sandwhich Thin"toasted  w/ peanut butter (no jelly: about 20 net carbs  "Bagel Thin" with cream cheese and salmon: about 20 carbs   a scrambled egg/bacon/cheese burrito made with Mission's "carb balance" whole wheat tortilla  (about 10 net carbs )  A slice of home made fritatta (egg based dish without a crust:  google it)    Avoid cereal and bananas, oatmeal and cream of wheat and grits. They are loaded with carbohydrates!   10 AM: high protein snack  Protein bar by Atkins (the snack size, under 200 cal, usually < 6 net carbs).    A stick of cheese:  Around 1 carb,  100 cal     Dannon Light n Fit [place] Yogurt  (80 cal, 8 carbs)  Other so called "protein bars" and Greek yogurts tend to be loaded with carbohydrates.  Remember, in food advertising, the word "energy" is synonymous for " carbohydrate."  Lunch:   A Sandwich using the bread choices listed, Can use any  Eggs,  lunchmeat, grilled meat or canned tuna), avocado, regular mayo/mustard  and cheese.  A Salad using blue cheese, ranch,  Goddess or vinagrette,  No croutons or "confetti" and no "candied nuts" but regular nuts OK.   No pretzels or chips.  Pickles and miniature sweet peppers are a good low carb alternative that provide a "crunch"  The bread is the only source of  carbohydrate in a sandwich and  can be decreased by trying some of these alternatives to traditional loaf bread  Joseph's makes a pita bread and a flat bread that are 50 cal and 4 net carbs available at [place] and [place].  This can be toasted to use with hummous as well  Toufayan makes a low carb flatbread that's 100 cal and 9 net carbs available at [employer] and [employer]'s makes 2 sizes of  Low carb whole wheat tortilla  (The large one is 210 cal and 6 net carbs) Avoid "Low fat dressings, as well as Barry Brunner and [place] dressings They are loaded with sugar!   3 PM/ Mid day  Snack:  Consider  1 ounce of  almonds, walnuts, pistachios, pecans, peanuts,  Macadamia nuts or a nut medley.  Avoid "granola"; the dried cranberries and raisins are loaded with carbohydrates. Mixed nuts as long as there are no raisins,  cranberries or dried fruit.    Try the prosciutto/mozzarella cheese sticks by Fiorruci  In deli /backery section   High protein   To avoid overindulging in snacks: Try drinking a glass of unsweeted almond/coconut milk  Or a cup of coffee with your Atkins chocolate bar t  o keep you from having 3!!!        6 PM  Dinner:     Meat/fowl/fish with a green salad, and either broccoli, cauliflower, green beans, spinach, brussel sprouts or  Lima beans. DO NOT BREAD THE PROTEIN!!      There is a low carb pasta by Dreamfield's that is acceptable and tastes great: only 5 digestible carbs/serving.( All grocery stores but BJs carry it )  Try Hurley Cisco Angelo's chicken piccata or chicken or eggplant parm over low carb pasta.(Lowes and BJs)   Marjory Lies Sanchez's "Carnitas" (pulled pork, no sauce,  0 carbs) or his beef pot roast to make a dinner burrito (at BJ's)  Pesto over low carb pasta (bj's sells a good quality pesto in the center refrigerated section of the deli   Try satueeing  Cheral Marker with mushroooms  Whole wheat pasta is still full of digestible carbs and  Not as low in glycemic index as  Dreamfield's.   Brown rice is still rice,  So skip the rice and noodles if you eat [place] or [place] (or at least limit to 1/2 cup)  9 PM snack :   Breyer's "low carb" fudgsicle or  ice cream bar (Carb Smart line), or  Weight Watcher's ice cream bar , or another "no sugar added" ice cream;  a serving of fresh berries/cherries with whipped cream   Cheese or DANNON'S LlGHT N FIT [place] YOGURT or the [place] greek yogurt   8 ounces of [place] unsweetened almond/cococunut milk    Avoid bananas, pineapple, grapes  and watermelon on a regular basis because they are high in sugar.  THINK OF THEM AS DESSERT  Remember that snack Substitutions should be less than 10 NET carbs per serving and meals < 20 carbs. Remember to subtract fiber grams to get the "net carbs."  Health Maintenance Adopting a healthy lifestyle and getting preventive care can go a long way to promote health and wellness. Talk with your health care provider about what schedule of regular examinations is right for you. This is a good chance for you to check in with your provider about disease prevention and staying healthy. In between checkups, there are plenty of things you can do on your own. Experts have done a lot of research about which lifestyle changes and preventive measures are most likely to keep you healthy. Ask your health care provider for more information. WEIGHT AND DIET  Eat a healthy diet  Be sure to include plenty of vegetables, fruits, low-fat dairy products, and lean protein.  Do not eat a lot of foods high in solid fats, added sugars, or salt.  Get regular exercise. This is one of the most important things you can do for your health.  Most adults should exercise for at least 150 minutes each week. The exercise should increase your heart rate and make you sweat (moderate-intensity exercise).  Most adults should also do strengthening exercises at least twice a week. This is in addition to the moderate-intensity  exercise.  Maintain a healthy weight  Body mass index (BMI) is a measurement that can be used to identify possible weight problems. It estimates body fat based on height and weight. Your health care provider can help determine your BMI and help you achieve or maintain a healthy weight.  For females 47 years of age and older:   A BMI below 18.5 is considered underweight.  A BMI of 18.5 to 24.9 is normal.  A BMI of 25  to 29.9 is considered overweight.  A BMI of 30 and above is considered obese.  Watch levels of cholesterol and blood lipids  You should start having your blood tested for lipids and cholesterol at 53 years of age, then have this test every 5 years.  You may need to have your cholesterol levels checked more often if:  Your lipid or cholesterol levels are high.  You are older than 53 years of age.  You are at high risk for heart disease.  CANCER SCREENING   Lung Cancer  Lung cancer screening is recommended for adults 1-50 years old who are at high risk for lung cancer because of a history of smoking.  A yearly low-dose CT scan of the lungs is recommended for people who:  Currently smoke.  Have quit within the past 15 years.  Have at least a 30-pack-year history of smoking. A pack year is smoking an average of one pack of cigarettes a day for 1 year.  Yearly screening should continue until it has been 15 years since you quit.  Yearly screening should stop if you develop a health problem that would prevent you from having lung cancer treatment.  Breast Cancer  Practice breast self-awareness. This means understanding how your breasts normally appear and feel.  It also means doing regular breast self-exams. Let your health care provider know about any changes, no matter how small.  If you are in your 20s or 30s, you should have a clinical breast exam (CBE) by a health care provider every 1-3 years as part of a regular health exam.  If you are 87 or  older, have a CBE every year. Also consider having a breast X-ray (mammogram) every year.  If you have a family history of breast cancer, talk to your health care provider about genetic screening.  If you are at high risk for breast cancer, talk to your health care provider about having an MRI and a mammogram every year.  Breast cancer gene (BRCA) assessment is recommended for women who have family members with BRCA-related cancers. BRCA-related cancers include:  Breast.  Ovarian.  Tubal.  Peritoneal cancers.  Results of the assessment will determine the need for genetic counseling and BRCA1 and BRCA2 testing. Cervical Cancer Routine pelvic examinations to screen for cervical cancer are no longer recommended for nonpregnant women who are considered low risk for cancer of the pelvic organs (ovaries, uterus, and vagina) and who do not have symptoms. A pelvic examination may be necessary if you have symptoms including those associated with pelvic infections. Ask your health care provider if a screening pelvic exam is right for you.   The Pap test is the screening test for cervical cancer for women who are considered at risk.  If you had a hysterectomy for a problem that was not cancer or a condition that could lead to cancer, then you no longer need Pap tests.  If you are older than 65 years, and you have had normal Pap tests for the past 10 years, you no longer need to have Pap tests.  If you have had past treatment for cervical cancer or a condition that could lead to cancer, you need Pap tests and screening for cancer for at least 20 years after your treatment.  If you no longer get a Pap test, assess your risk factors if they change (such as having a new sexual partner). This can affect whether you should start being screened again.  Some women have medical  problems that increase their chance of getting cervical cancer. If this is the case for you, your health care provider may  recommend more frequent screening and Pap tests.  The human papillomavirus (HPV) test is another test that may be used for cervical cancer screening. The HPV test looks for the virus that can cause cell changes in the cervix. The cells collected during the Pap test can be tested for HPV.  The HPV test can be used to screen women 42 years of age and older. Getting tested for HPV can extend the interval between normal Pap tests from three to five years.  An HPV test also should be used to screen women of any age who have unclear Pap test results.  After 53 years of age, women should have HPV testing as often as Pap tests.  Colorectal Cancer  This type of cancer can be detected and often prevented.  Routine colorectal cancer screening usually begins at 53 years of age and continues through 53 years of age.  Your health care provider may recommend screening at an earlier age if you have risk factors for colon cancer.  Your health care provider may also recommend using home test kits to check for hidden blood in the stool.  A small camera at the end of a tube can be used to examine your colon directly (sigmoidoscopy or colonoscopy). This is done to check for the earliest forms of colorectal cancer.  Routine screening usually begins at age 70.  Direct examination of the colon should be repeated every 5-10 years through 53 years of age. However, you may need to be screened more often if early forms of precancerous polyps or small growths are found. Skin Cancer  Check your skin from head to toe regularly.  Tell your health care provider about any new moles or changes in moles, especially if there is a change in a mole's shape or color.  Also tell your health care provider if you have a mole that is larger than the size of a pencil eraser.  Always use sunscreen. Apply sunscreen liberally and repeatedly throughout the day.  Protect yourself by wearing long sleeves, pants, a wide-brimmed hat,  and sunglasses whenever you are outside. HEART DISEASE, DIABETES, AND HIGH BLOOD PRESSURE   Have your blood pressure checked at least every 1-2 years. High blood pressure causes heart disease and increases the risk of stroke.  If you are between 45 years and 70 years old, ask your health care provider if you should take aspirin to prevent strokes.  Have regular diabetes screenings. This involves taking a blood sample to check your fasting blood sugar level.  If you are at a normal weight and have a low risk for diabetes, have this test once every three years after 53 years of age.  If you are overweight and have a high risk for diabetes, consider being tested at a younger age or more often. PREVENTING INFECTION  Hepatitis B  If you have a higher risk for hepatitis B, you should be screened for this virus. You are considered at high risk for hepatitis B if:  You were born in a country where hepatitis B is common. Ask your health care provider which countries are considered high risk.  Your parents were born in a high-risk country, and you have not been immunized against hepatitis B (hepatitis B vaccine).  You have HIV or AIDS.  You use needles to inject street drugs.  You live with someone who  has hepatitis B.  You have had sex with someone who has hepatitis B.  You get hemodialysis treatment.  You take certain medicines for conditions, including cancer, organ transplantation, and autoimmune conditions. Hepatitis C  Blood testing is recommended for:  Everyone born from [birth year] through 1965.  Anyone with known risk factors for hepatitis C. Sexually transmitted infections (STIs)  You should be screened for sexually transmitted infections (STIs) including gonorrhea and chlamydia if:  You are sexually active and are younger than 53 years of age.  You are older than 53 years of age and your health care provider tells you that you are at risk for this type of infection.  Your  sexual activity has changed since you were last screened and you are at an increased risk for chlamydia or gonorrhea. Ask your health care provider if you are at risk.  If you do not have HIV, but are at risk, it may be recommended that you take a prescription medicine daily to prevent HIV infection. This is called pre-exposure prophylaxis (PrEP). You are considered at risk if:  You are sexually active and do not regularly use condoms or know the HIV status of your partner(s).  You take drugs by injection.  You are sexually active with a partner who has HIV. Talk with your health care provider about whether you are at high risk of being infected with HIV. If you choose to begin PrEP, you should first be tested for HIV. You should then be tested every 3 months for as long as you are taking PrEP.  PREGNANCY   If you are premenopausal and you may become pregnant, ask your health care provider about preconception counseling.  If you may become pregnant, take 400 to 800 micrograms (mcg) of folic acid every day.  If you want to prevent pregnancy, talk to your health care provider about birth control (contraception). OSTEOPOROSIS AND MENOPAUSE   Osteoporosis is a disease in which the bones lose minerals and strength with aging. This can result in serious bone fractures. Your risk for osteoporosis can be identified using a bone density scan.  If you are 35 years of age or older, or if you are at risk for osteoporosis and fractures, ask your health care provider if you should be screened.  Ask your health care provider whether you should take a calcium or vitamin D supplement to lower your risk for osteoporosis.  Menopause may have certain physical symptoms and risks.  Hormone replacement therapy may reduce some of these symptoms and risks. Talk to your health care provider about whether hormone replacement therapy is right for you.  HOME CARE INSTRUCTIONS   Schedule regular health, dental, and  eye exams.  Stay current with your immunizations.   Do not use any tobacco products including cigarettes, chewing tobacco, or electronic cigarettes.  If you are pregnant, do not drink alcohol.  If you are breastfeeding, limit how much and how often you drink alcohol.  Limit alcohol intake to no more than 1 drink per day for nonpregnant women. One drink equals 12 ounces of beer, 5 ounces of wine, or 1 ounces of hard liquor.  Do not use street drugs.  Do not share needles.  Ask your health care provider for help if you need support or information about quitting drugs.  Tell your health care provider if you often feel depressed.  Tell your health care provider if you have ever been abused or do not feel safe at home. Document Released:  01/17/2011 Document Revised: 11/18/2013 Document Reviewed: 06/05/2013 ExitCare Patient Information 2015 [place], [place]. This information is not intended to replace advice given to you by your health care provider. Make sure you discuss any questions you have with your health care provider.

## 2014-03-05 NOTE — Progress Notes (Signed)
Pre-visit discussion using our clinic review tool. No additional management support is needed unless otherwise documented below in the visit note.  

## 2014-03-07 ENCOUNTER — Other Ambulatory Visit (INDEPENDENT_AMBULATORY_CARE_PROVIDER_SITE_OTHER): Payer: 59

## 2014-03-07 DIAGNOSIS — T50904A Poisoning by unspecified drugs, medicaments and biological substances, undetermined, initial encounter: Secondary | ICD-10-CM

## 2014-03-07 DIAGNOSIS — E669 Obesity, unspecified: Secondary | ICD-10-CM

## 2014-03-07 DIAGNOSIS — R7989 Other specified abnormal findings of blood chemistry: Secondary | ICD-10-CM

## 2014-03-07 DIAGNOSIS — T50905A Adverse effect of unspecified drugs, medicaments and biological substances, initial encounter: Secondary | ICD-10-CM

## 2014-03-07 DIAGNOSIS — R739 Hyperglycemia, unspecified: Secondary | ICD-10-CM

## 2014-03-07 DIAGNOSIS — R748 Abnormal levels of other serum enzymes: Secondary | ICD-10-CM

## 2014-03-07 DIAGNOSIS — R7309 Other abnormal glucose: Secondary | ICD-10-CM

## 2014-03-07 LAB — COMPREHENSIVE METABOLIC PANEL
ALT: 51 U/L — ABNORMAL HIGH (ref 0–35)
AST: 31 U/L (ref 0–37)
Albumin: 4.2 g/dL (ref 3.5–5.2)
Alkaline Phosphatase: 42 U/L (ref 39–117)
BUN: 15 mg/dL (ref 6–23)
CO2: 29 mEq/L (ref 19–32)
Calcium: 9.5 mg/dL (ref 8.4–10.5)
Chloride: 101 mEq/L (ref 96–112)
Creatinine, Ser: 0.8 mg/dL (ref 0.4–1.2)
GFR: 82.06 mL/min (ref >60.00)
Glucose, Bld: 97 mg/dL (ref 70–99)
Potassium: 4.3 mEq/L (ref 3.5–5.1)
Sodium: 138 mEq/L (ref 135–145)
Total Bilirubin: 0.6 mg/dL (ref 0.2–1.2)
Total Protein: 7.3 g/dL (ref 6.0–8.3)

## 2014-03-07 LAB — LIPID PANEL
Cholesterol: 235 mg/dL — ABNORMAL HIGH (ref 0–200)
HDL: 44.1 mg/dL (ref >39.00)
NonHDL: 190.9
Total CHOL/HDL Ratio: 5
Triglycerides: 301 mg/dL — ABNORMAL HIGH (ref 0.0–149.0)
VLDL: 60.2 mg/dL — ABNORMAL HIGH (ref 0.0–40.0)

## 2014-03-07 LAB — HEMOGLOBIN A1C: Hgb A1c MFr Bld: 6 % (ref 4.6–6.5)

## 2014-03-07 LAB — TSH: TSH: 0.99 u[IU]/mL (ref 0.35–4.50)

## 2014-03-08 DIAGNOSIS — Z0000 Encounter for general adult medical examination without abnormal findings: Secondary | ICD-10-CM | POA: Insufficient documentation

## 2014-03-08 LAB — LDL CHOLESTEROL, DIRECT: Direct LDL: 140 mg/dL

## 2014-03-08 LAB — MICROALBUMIN / CREATININE URINE RATIO
Creatinine,U: 26.2 mg/dL
Microalb Creat Ratio: 4.6 mg/g (ref 0.0–30.0)
Microalb, Ur: 1.2 mg/dL (ref 0.0–1.9)

## 2014-03-08 NOTE — Assessment & Plan Note (Signed)
Improved pain with use of fish oil

## 2014-03-08 NOTE — Assessment & Plan Note (Addendum)
She did not tolerate Tricor due to moderate myalgias.  Will repeat  triglycerides on current regimen of low GI diet and fish oil

## 2014-03-08 NOTE — Assessment & Plan Note (Signed)
I have addressed  BMI and recommended a low glycemic index diet utilizing smaller more frequent meals to increase metabolism.  I have also recommended that patient start exercising with a goal of 30 minutes of aerobic exercise a minimum of 5 days per week. Screening for lipid disorders, thyroid and diabetes was doen Oct 2014 and will be repeated.  Lab Results  Component Value Date   TSH 0.52 04/29/2013   Lab Results  Component Value Date   HGBA1C 6.1 04/29/2013

## 2014-03-08 NOTE — Assessment & Plan Note (Signed)
Smoking cessation instruction/counseling given:  counseled patient on the dangers of tobacco use, advised patient to stop smoking, and reviewed strategies to maximize success.  Nicoderm patches offered

## 2014-03-08 NOTE — Assessment & Plan Note (Signed)
Encouraged weigth loss, low glycemic index diet.  Return for fasting labs.

## 2014-03-08 NOTE — Assessment & Plan Note (Signed)
Controlled with daily protonix

## 2014-03-08 NOTE — Assessment & Plan Note (Signed)
Annual comprehensive physical exam and management of acute and chronic conditions .  Health maintenance screenings have ben addressed and hand out given .

## 2014-03-09 ENCOUNTER — Encounter: Payer: Self-pay | Admitting: Internal Medicine

## 2014-03-09 DIAGNOSIS — K760 Fatty (change of) liver, not elsewhere classified: Secondary | ICD-10-CM | POA: Insufficient documentation

## 2014-03-09 NOTE — Addendum Note (Signed)
Addended by: Crecencio Mc on: 03/09/2014 09:41 AM   Modules accepted: Orders

## 2014-03-09 NOTE — Assessment & Plan Note (Signed)
Lab Results  Component Value Date   HGBA1C 6.0 03/07/2014

## 2014-03-11 NOTE — Telephone Encounter (Signed)
Mailed unread message to pt  

## 2014-04-04 ENCOUNTER — Encounter: Payer: Self-pay | Admitting: Internal Medicine

## 2014-04-09 ENCOUNTER — Other Ambulatory Visit (INDEPENDENT_AMBULATORY_CARE_PROVIDER_SITE_OTHER): Payer: 59

## 2014-04-09 ENCOUNTER — Telehealth: Payer: Self-pay | Admitting: *Deleted

## 2014-04-09 DIAGNOSIS — R748 Abnormal levels of other serum enzymes: Secondary | ICD-10-CM

## 2014-04-09 DIAGNOSIS — L989 Disorder of the skin and subcutaneous tissue, unspecified: Secondary | ICD-10-CM

## 2014-04-09 DIAGNOSIS — R768 Other specified abnormal immunological findings in serum: Secondary | ICD-10-CM

## 2014-04-09 LAB — FERRITIN: Ferritin: 58.3 ng/mL (ref 10.0–291.0)

## 2014-04-09 LAB — HEPATIC FUNCTION PANEL
ALT: 53 U/L — ABNORMAL HIGH (ref 0–35)
AST: 32 U/L (ref 0–37)
Albumin: 4.3 g/dL (ref 3.5–5.2)
Alkaline Phosphatase: 43 U/L (ref 39–117)
Bilirubin, Direct: 0.1 mg/dL (ref 0.0–0.3)
Total Bilirubin: 0.4 mg/dL (ref 0.2–1.2)
Total Protein: 7.8 g/dL (ref 6.0–8.3)

## 2014-04-09 LAB — IRON AND TIBC
%SAT: 23 % (ref 20–55)
Iron: 81 ug/dL (ref 42–145)
TIBC: 358 ug/dL (ref 250–470)
UIBC: 277 ug/dL (ref 125–400)

## 2014-04-09 NOTE — Addendum Note (Signed)
Addended by: Crecencio Mc on: 04/09/2014 03:57 PM   Modules accepted: Orders

## 2014-04-09 NOTE — Telephone Encounter (Signed)
Referral is in process as requested  To [facility]

## 2014-04-09 NOTE — Telephone Encounter (Signed)
Patient was seeing Dr. Koleen Nimrod for skin care for years and then she saw Dr. Ree Edman, for brown spots on her face but she prefers not to see her any longer. Please advise.

## 2014-04-09 NOTE — Telephone Encounter (Signed)
Juliann Pulse,   Please call patient and find out some helpful information about the referral since I have no info in the chart or from [place] has she seen?  What for?    Thanks

## 2014-04-09 NOTE — Telephone Encounter (Signed)
Pt would like a recommendation to a dermatologist

## 2014-04-09 NOTE — Telephone Encounter (Signed)
She stated she didn't like the current one she had

## 2014-04-09 NOTE — Telephone Encounter (Signed)
Please advise 

## 2014-04-09 NOTE — Telephone Encounter (Signed)
A recommendation or a referral?  And i cannot refer without a reason

## 2014-04-10 LAB — HEPATITIS B SURFACE ANTIGEN: Hepatitis B Surface Ag: NEGATIVE

## 2014-04-10 LAB — HEPATITIS C ANTIBODY: HCV Ab: REACTIVE — AB

## 2014-04-10 LAB — HEPATITIS B SURFACE ANTIBODY,QUALITATIVE: Hep B S Ab: NEGATIVE

## 2014-04-10 LAB — HEPATITIS B CORE ANTIBODY, TOTAL: Hep B Core Total Ab: NONREACTIVE

## 2014-04-10 LAB — ANA: Anti Nuclear Antibody(ANA): NEGATIVE

## 2014-04-10 LAB — ANTI-SMITH ANTIBODY: ENA SM Ab Ser-aCnc: <1

## 2014-04-10 NOTE — Telephone Encounter (Signed)
Pt notified that [facility] will call once appointment has been scheduled

## 2014-04-12 ENCOUNTER — Encounter: Payer: Self-pay | Admitting: Internal Medicine

## 2014-04-12 DIAGNOSIS — R768 Other specified abnormal immunological findings in serum: Secondary | ICD-10-CM | POA: Insufficient documentation

## 2014-04-12 NOTE — Addendum Note (Signed)
Addended by: Crecencio Mc on: 04/12/2014 09:17 PM   Modules accepted: Orders

## 2014-04-14 ENCOUNTER — Encounter: Payer: Self-pay | Admitting: Internal Medicine

## 2014-04-15 ENCOUNTER — Other Ambulatory Visit (INDEPENDENT_AMBULATORY_CARE_PROVIDER_SITE_OTHER): Payer: 59

## 2014-04-15 DIAGNOSIS — R768 Other specified abnormal immunological findings in serum: Secondary | ICD-10-CM

## 2014-04-15 DIAGNOSIS — R894 Abnormal immunological findings in specimens from other organs, systems and tissues: Secondary | ICD-10-CM

## 2014-04-17 LAB — HEPATITIS C RNA QUANTITATIVE: HCV Quantitative: NOT DETECTED IU/mL (ref <15)

## 2014-04-18 ENCOUNTER — Encounter: Payer: Self-pay | Admitting: Internal Medicine

## 2014-04-18 ENCOUNTER — Telehealth: Payer: Self-pay

## 2014-04-18 NOTE — Telephone Encounter (Signed)
Please advise 

## 2014-04-18 NOTE — Telephone Encounter (Signed)
The patient is hoping to hear back about her labs that were done on Tuesday.   Callback - [phone]

## 2014-04-21 NOTE — Telephone Encounter (Signed)
Patient was e mailed the results on Friday

## 2014-07-16 ENCOUNTER — Telehealth: Payer: Self-pay | Admitting: Family

## 2014-07-16 DIAGNOSIS — J019 Acute sinusitis, unspecified: Secondary | ICD-10-CM

## 2014-07-16 MED ORDER — AMOXICILLIN-POT CLAVULANATE 875-125 MG PO TABS: 1.0000 | ORAL_TABLET | Freq: Two times a day (BID) | ORAL | Status: DC

## 2014-07-16 NOTE — Progress Notes (Signed)

## 2014-07-21 ENCOUNTER — Telehealth: Payer: Self-pay | Admitting: Family

## 2014-07-21 ENCOUNTER — Other Ambulatory Visit: Payer: Self-pay | Admitting: Internal Medicine

## 2014-07-21 DIAGNOSIS — R05 Cough: Secondary | ICD-10-CM

## 2014-07-21 DIAGNOSIS — R0602 Shortness of breath: Secondary | ICD-10-CM

## 2014-07-21 DIAGNOSIS — R059 Cough, unspecified: Secondary | ICD-10-CM

## 2014-07-21 MED ORDER — BENZONATATE 100 MG PO CAPS: 100.0000 mg | ORAL_CAPSULE | Freq: Three times a day (TID) | ORAL | Status: DC | PRN

## 2014-07-21 MED ORDER — ALBUTEROL SULFATE HFA 108 (90 BASE) MCG/ACT IN AERS: 2.0000 | INHALATION_SPRAY | Freq: Four times a day (QID) | RESPIRATORY_TRACT | Status: DC | PRN

## 2014-07-21 NOTE — Progress Notes (Signed)
We are sorry that you are not feeling well.  Here is how we plan to help!  Based on what you have shared with me it looks like you have upper respiratory tract inflammation that has resulted in a signification cough.  Inflammation and infection in the upper respiratory tract is commonly called bronchitis and has four common causes:  Allergies, Viral Infections, Acid Reflux and Bacterial Infections.  Allergies, viruses and acid reflux are treated by controlling symptoms or eliminating the cause. An example might be a cough caused by taking certain blood pressure medications. You stop the cough by changing the medication. Another example might be a cough caused by acid reflux. Controlling the reflux helps control the cough.  Based on your presentation I believe you most likely have A cough due to bacteria.  When patients have a fever and a productive cough with a change in color or increased sputum production, we are concerned about bacterial bronchitis.  If left untreated it can progress to pneumonia.  If your symptoms do not improve with your treatment plan it is important that you contact your provider.     It sounds like you are responding well to the antibiotics, but the cough is still bothersome. I will prescribe the Albuterol (same as pro-air)  inhaler to Inhale 2 puffs into the lungs every 6 (six) hours as needed for wheezing or shortness of breath, as you reported it works very well for you historically. Keep using the Mucinex as you have been. In addition you may use A non-prescription cough medication called Robitussin DAC. Take 2 teaspoons every 8 hours or Delsym: take 2 teaspoons every 12 hours. and A prescription cough medication called Tessalon Perles 100mg . You may take 1-2 capsules every 8 hours as needed for your cough.    HOME CARE . Only take medications as instructed by your medical team. . Complete the entire course of an antibiotic. . Drink plenty of fluids and get plenty of  rest. . Avoid close contacts especially the very young and the elderly . Cover your mouth if you cough or cough into your sleeve. . Always remember to wash your hands . A steam or ultrasonic humidifier can help congestion.    GET HELP RIGHT AWAY IF: . You develop worsening fever. . You become short of breath . You cough up blood. . Your symptoms persist after you have completed your treatment plan MAKE SURE YOU   Understand these instructions.  Will watch your condition.  Will get help right away if you are not doing well or get worse.  Your e-visit answers were reviewed by a board certified advanced clinical practitioner to complete your personal care plan.  Depending on the condition, your plan could have included both over the counter or prescription medications.  If there is a problem please reply  once you have received a response from your provider.  Your safety is important to [place].  If you have drug allergies check your prescription carefully.    You can use MyChart to ask questions about today's visit, request a non-urgent call back, or ask for a work or school excuse.  You will get an e-mail in the next two days asking about your experience.  I hope that your e-visit has been valuable and will speed your recovery. Thank you for using e-visits.

## 2014-07-24 ENCOUNTER — Encounter: Payer: Self-pay | Admitting: *Deleted

## 2014-07-24 NOTE — Telephone Encounter (Signed)
From: Lisa Valencia Sent: 04/06/2014 6:39 PM EDT To: [name] Subject: RE:Flu Shot Clinic Saturday April 12, 2014  Had mine at work on 03/31/2014.  ----- Message ----- From: Lisa Valencia Sent: 04/05/2014 6:37 PM EDT To: Lisa Valencia Subject: Flu Shot Clinic Saturday April 12, 2014  [facility] will hold a [facility] on Saturday, September 26 from 9 am to noon. In order to plan appropriately, we ask you to please call the office to schedule an appointment time during the clinic. Our schedulers are ready to assist you, Monday through Friday, 8 am to 5 pm at [phone]. Thank you for choosing [facility] for your healthcare needs.

## 2014-07-30 ENCOUNTER — Other Ambulatory Visit: Payer: Self-pay | Admitting: Internal Medicine

## 2014-11-03 ENCOUNTER — Other Ambulatory Visit: Payer: Self-pay | Admitting: Internal Medicine

## 2014-11-03 NOTE — Telephone Encounter (Signed)
Last visit 03/05/14

## 2014-11-04 NOTE — Telephone Encounter (Signed)
Ok to refill,  printed rx  

## 2014-11-04 NOTE — Telephone Encounter (Signed)
Faxed to pharmacy

## 2015-01-26 ENCOUNTER — Encounter: Payer: Self-pay | Admitting: Internal Medicine

## 2015-01-26 ENCOUNTER — Ambulatory Visit (INDEPENDENT_AMBULATORY_CARE_PROVIDER_SITE_OTHER): Payer: 59 | Admitting: Internal Medicine

## 2015-01-26 VITALS — BP 130/82 | HR 74 | Temp 98.1°F | Resp 16 | Ht 69.0 in | Wt 277.4 lb

## 2015-01-26 DIAGNOSIS — M5432 Sciatica, left side: Secondary | ICD-10-CM

## 2015-01-26 DIAGNOSIS — Z720 Tobacco use: Secondary | ICD-10-CM

## 2015-01-26 DIAGNOSIS — E669 Obesity, unspecified: Secondary | ICD-10-CM | POA: Diagnosis not present

## 2015-01-26 DIAGNOSIS — Z1211 Encounter for screening for malignant neoplasm of colon: Secondary | ICD-10-CM

## 2015-01-26 DIAGNOSIS — E785 Hyperlipidemia, unspecified: Secondary | ICD-10-CM

## 2015-01-26 DIAGNOSIS — Z0000 Encounter for general adult medical examination without abnormal findings: Secondary | ICD-10-CM | POA: Diagnosis not present

## 2015-01-26 DIAGNOSIS — R7301 Impaired fasting glucose: Secondary | ICD-10-CM | POA: Diagnosis not present

## 2015-01-26 DIAGNOSIS — R5383 Other fatigue: Secondary | ICD-10-CM

## 2015-01-26 DIAGNOSIS — R748 Abnormal levels of other serum enzymes: Secondary | ICD-10-CM

## 2015-01-26 LAB — HEMOGLOBIN A1C: Hgb A1c MFr Bld: 5.8 % (ref 4.6–6.5)

## 2015-01-26 LAB — COMPREHENSIVE METABOLIC PANEL
ALT: 50 U/L — ABNORMAL HIGH (ref 0–35)
AST: 26 U/L (ref 0–37)
Albumin: 4.6 g/dL (ref 3.5–5.2)
Alkaline Phosphatase: 44 U/L (ref 39–117)
BUN: 16 mg/dL (ref 6–23)
CO2: 30 mEq/L (ref 19–32)
Calcium: 10 mg/dL (ref 8.4–10.5)
Chloride: 100 mEq/L (ref 96–112)
Creatinine, Ser: 0.73 mg/dL (ref 0.40–1.20)
GFR: 88.28 mL/min (ref >60.00)
Glucose, Bld: 86 mg/dL (ref 70–99)
Potassium: 4.5 mEq/L (ref 3.5–5.1)
Sodium: 138 mEq/L (ref 135–145)
Total Bilirubin: 0.4 mg/dL (ref 0.2–1.2)
Total Protein: 7.5 g/dL (ref 6.0–8.3)

## 2015-01-26 LAB — LIPID PANEL
Cholesterol: 259 mg/dL — ABNORMAL HIGH (ref 0–200)
HDL: 42.8 mg/dL (ref >39.00)
NonHDL: 216.2
Total CHOL/HDL Ratio: 6
Triglycerides: 346 mg/dL — ABNORMAL HIGH (ref 0.0–149.0)
VLDL: 69.2 mg/dL — ABNORMAL HIGH (ref 0.0–40.0)

## 2015-01-26 LAB — CBC WITH DIFFERENTIAL/PLATELET
Basophils Absolute: 0 10*3/uL (ref 0.0–0.1)
Basophils Relative: 0.4 % (ref 0.0–3.0)
Eosinophils Absolute: 0.2 10*3/uL (ref 0.0–0.7)
Eosinophils Relative: 2.9 % (ref 0.0–5.0)
HCT: 43.7 % (ref 36.0–46.0)
Hemoglobin: 14.7 g/dL (ref 12.0–15.0)
Lymphocytes Relative: 29.2 % (ref 12.0–46.0)
Lymphs Abs: 2.2 10*3/uL (ref 0.7–4.0)
MCHC: 33.7 g/dL (ref 30.0–36.0)
MCV: 87.8 fl (ref 78.0–100.0)
Monocytes Absolute: 0.4 10*3/uL (ref 0.1–1.0)
Monocytes Relative: 5.7 % (ref 3.0–12.0)
Neutro Abs: 4.6 10*3/uL (ref 1.4–7.7)
Neutrophils Relative %: 61.8 % (ref 43.0–77.0)
Platelets: 211 10*3/uL (ref 150.0–400.0)
RBC: 4.98 Mil/uL (ref 3.87–5.11)
RDW: 13.9 % (ref 11.5–15.5)
WBC: 7.4 10*3/uL (ref 4.0–10.5)

## 2015-01-26 LAB — LDL CHOLESTEROL, DIRECT: Direct LDL: 171 mg/dL

## 2015-01-26 MED ORDER — CELECOXIB 200 MG PO CAPS: ORAL_CAPSULE | ORAL | Status: DC

## 2015-01-26 NOTE — Patient Instructions (Addendum)
Stop the ibuprofen this week and resume  celebrex twice daily   We will refill the celebrex [facility]   Health Maintenance Adopting a healthy lifestyle and getting preventive care can go a long way to promote health and wellness. Talk with your health care provider about what schedule of regular examinations is right for you. This is a good chance for you to check in with your provider about disease prevention and staying healthy. In between checkups, there are plenty of things you can do on your own. Experts have done a lot of research about which lifestyle changes and preventive measures are most likely to keep you healthy. Ask your health care provider for more information. WEIGHT AND DIET  Eat a healthy diet  Be sure to include plenty of vegetables, fruits, low-fat dairy products, and lean protein.  Do not eat a lot of foods high in solid fats, added sugars, or salt.  Get regular exercise. This is one of the most important things you can do for your health.  Most adults should exercise for at least 150 minutes each week. The exercise should increase your heart rate and make you sweat (moderate-intensity exercise).  Most adults should also do strengthening exercises at least twice a week. This is in addition to the moderate-intensity exercise.  Maintain a healthy weight  Body mass index (BMI) is a measurement that can be used to identify possible weight problems. It estimates body fat based on height and weight. Your health care provider can help determine your BMI and help you achieve or maintain a healthy weight.  For females 51 years of age and older:   A BMI below 18.5 is considered underweight.  A BMI of 18.5 to 24.9 is normal.  A BMI of 25 to 29.9 is considered overweight.  A BMI of 30 and above is considered obese.  Watch levels of cholesterol and blood lipids  You should start having your blood tested for lipids and cholesterol at 54 years of age, then  have this test every 5 years.  You may need to have your cholesterol levels checked more often if:  Your lipid or cholesterol levels are high.  You are older than 54 years of age.  You are at high risk for heart disease.  CANCER SCREENING   Lung Cancer  Lung cancer screening is recommended for adults 18-36 years old who are at high risk for lung cancer because of a history of smoking.  A yearly low-dose CT scan of the lungs is recommended for people who:  Currently smoke.  Have quit within the past 15 years.  Have at least a 30-pack-year history of smoking. A pack year is smoking an average of one pack of cigarettes a day for 1 year.  Yearly screening should continue until it has been 15 years since you quit.  Yearly screening should stop if you develop a health problem that would prevent you from having lung cancer treatment.  Breast Cancer  Practice breast self-awareness. This means understanding how your breasts normally appear and feel.  It also means doing regular breast self-exams. Let your health care provider know about any changes, no matter how small.  If you are in your 20s or 30s, you should have a clinical breast exam (CBE) by a health care provider every 1-3 years as part of a regular health exam.  If you are 47 or older, have a CBE every year. Also consider having a breast X-ray (mammogram)  every year.  If you have a family history of breast cancer, talk to your health care provider about genetic screening.  If you are at high risk for breast cancer, talk to your health care provider about having an MRI and a mammogram every year.  Breast cancer gene (BRCA) assessment is recommended for women who have family members with BRCA-related cancers. BRCA-related cancers include:  Breast.  Ovarian.  Tubal.  Peritoneal cancers.  Results of the assessment will determine the need for genetic counseling and BRCA1 and BRCA2 testing. Cervical Cancer Routine  pelvic examinations to screen for cervical cancer are no longer recommended for nonpregnant women who are considered low risk for cancer of the pelvic organs (ovaries, uterus, and vagina) and who do not have symptoms. A pelvic examination may be necessary if you have symptoms including those associated with pelvic infections. Ask your health care provider if a screening pelvic exam is right for you.   The Pap test is the screening test for cervical cancer for women who are considered at risk.  If you had a hysterectomy for a problem that was not cancer or a condition that could lead to cancer, then you no longer need Pap tests.  If you are older than 65 years, and you have had normal Pap tests for the past 10 years, you no longer need to have Pap tests.  If you have had past treatment for cervical cancer or a condition that could lead to cancer, you need Pap tests and screening for cancer for at least 20 years after your treatment.  If you no longer get a Pap test, assess your risk factors if they change (such as having a new sexual partner). This can affect whether you should start being screened again.  Some women have medical problems that increase their chance of getting cervical cancer. If this is the case for you, your health care provider may recommend more frequent screening and Pap tests.  The human papillomavirus (HPV) test is another test that may be used for cervical cancer screening. The HPV test looks for the virus that can cause cell changes in the cervix. The cells collected during the Pap test can be tested for HPV.  The HPV test can be used to screen women 58 years of age and older. Getting tested for HPV can extend the interval between normal Pap tests from three to five years.  An HPV test also should be used to screen women of any age who have unclear Pap test results.  After 54 years of age, women should have HPV testing as often as Pap tests.  Colorectal Cancer  This  type of cancer can be detected and often prevented.  Routine colorectal cancer screening usually begins at 54 years of age and continues through 54 years of age.  Your health care provider may recommend screening at an earlier age if you have risk factors for colon cancer.  Your health care provider may also recommend using home test kits to check for hidden blood in the stool.  A small camera at the end of a tube can be used to examine your colon directly (sigmoidoscopy or colonoscopy). This is done to check for the earliest forms of colorectal cancer.  Routine screening usually begins at age 85.  Direct examination of the colon should be repeated every 5-10 years through 54 years of age. However, you may need to be screened more often if early forms of precancerous polyps or small growths are  found. Skin Cancer  Check your skin from head to toe regularly.  Tell your health care provider about any new moles or changes in moles, especially if there is a change in a mole's shape or color.  Also tell your health care provider if you have a mole that is larger than the size of a pencil eraser.  Always use sunscreen. Apply sunscreen liberally and repeatedly throughout the day.  Protect yourself by wearing long sleeves, pants, a wide-brimmed hat, and sunglasses whenever you are outside. HEART DISEASE, DIABETES, AND HIGH BLOOD PRESSURE   Have your blood pressure checked at least every 1-2 years. High blood pressure causes heart disease and increases the risk of stroke.  If you are between 32 years and 69 years old, ask your health care provider if you should take aspirin to prevent strokes.  Have regular diabetes screenings. This involves taking a blood sample to check your fasting blood sugar level.  If you are at a normal weight and have a low risk for diabetes, have this test once every three years after 54 years of age.  If you are overweight and have a high risk for diabetes,  consider being tested at a younger age or more often. PREVENTING INFECTION  Hepatitis B  If you have a higher risk for hepatitis B, you should be screened for this virus. You are considered at high risk for hepatitis B if:  You were born in a country where hepatitis B is common. Ask your health care provider which countries are considered high risk.  Your parents were born in a high-risk country, and you have not been immunized against hepatitis B (hepatitis B vaccine).  You have HIV or AIDS.  You use needles to inject street drugs.  You live with someone who has hepatitis B.  You have had sex with someone who has hepatitis B.  You get hemodialysis treatment.  You take certain medicines for conditions, including cancer, organ transplantation, and autoimmune conditions. Hepatitis C  Blood testing is recommended for:  Everyone born from [birth year] through 1965.  Anyone with known risk factors for hepatitis C. Sexually transmitted infections (STIs)  You should be screened for sexually transmitted infections (STIs) including gonorrhea and chlamydia if:  You are sexually active and are younger than 54 years of age.  You are older than 54 years of age and your health care provider tells you that you are at risk for this type of infection.  Your sexual activity has changed since you were last screened and you are at an increased risk for chlamydia or gonorrhea. Ask your health care provider if you are at risk.  If you do not have HIV, but are at risk, it may be recommended that you take a prescription medicine daily to prevent HIV infection. This is called pre-exposure prophylaxis (PrEP). You are considered at risk if:  You are sexually active and do not regularly use condoms or know the HIV status of your partner(s).  You take drugs by injection.  You are sexually active with a partner who has HIV. Talk with your health care provider about whether you are at high risk of being  infected with HIV. If you choose to begin PrEP, you should first be tested for HIV. You should then be tested every 3 months for as long as you are taking PrEP.  PREGNANCY   If you are premenopausal and you may become pregnant, ask your health care provider about preconception counseling.  If  you may become pregnant, take 400 to 800 micrograms (mcg) of folic acid every day.  If you want to prevent pregnancy, talk to your health care provider about birth control (contraception). OSTEOPOROSIS AND MENOPAUSE   Osteoporosis is a disease in which the bones lose minerals and strength with aging. This can result in serious bone fractures. Your risk for osteoporosis can be identified using a bone density scan.  If you are 100 years of age or older, or if you are at risk for osteoporosis and fractures, ask your health care provider if you should be screened.  Ask your health care provider whether you should take a calcium or vitamin D supplement to lower your risk for osteoporosis.  Menopause may have certain physical symptoms and risks.  Hormone replacement therapy may reduce some of these symptoms and risks. Talk to your health care provider about whether hormone replacement therapy is right for you.  HOME CARE INSTRUCTIONS   Schedule regular health, dental, and eye exams.  Stay current with your immunizations.   Do not use any tobacco products including cigarettes, chewing tobacco, or electronic cigarettes.  If you are pregnant, do not drink alcohol.  If you are breastfeeding, limit how much and how often you drink alcohol.  Limit alcohol intake to no more than 1 drink per day for nonpregnant women. One drink equals 12 ounces of beer, 5 ounces of wine, or 1 ounces of hard liquor.  Do not use street drugs.  Do not share needles.  Ask your health care provider for help if you need support or information about quitting drugs.  Tell your health care provider if you often feel  depressed.  Tell your health care provider if you have ever been abused or do not feel safe at home. Document Released: 01/17/2011 Document Revised: 11/18/2013 Document Reviewed: 06/05/2013 [facility] Patient Information 2015 [place], [place]. This information is not intended to replace advice given to you by your health care provider. Make sure you discuss any questions you have with your health care provider.

## 2015-01-26 NOTE — Progress Notes (Addendum)
Patient ID: Lisa Valencia, female    DOB: [birth year]  Age: 54 y.o. MRN: [id number]  The patient is here for annualwellness examination and management of other chronic and acute problems., including elevated liver enzymes , obesity, tobacco abuse, and impaired fasting glucose     She is due for colonoscopy this year She is due for mammogram August She is S/p abd hysterectomy  The risk factors are reflected in the social history.  She is still smoking daily.   The roster of all physicians providing medical care to patient - is listed in the Snapshot section of the chart.  Activities of daily living:  The patient is 100% independent in all ADLs: dressing, toileting, feeding as well as independent mobility  Home safety : The patient has smoke detectors in the home. They wear seatbelts.  There are no firearms at home. There is no violence in the home.   There is a diagnosis  of cleared Hepatitis C infection .  She has no travel history to infectious disease endemic areas of the world.  The patient has seen their dentist in the last six month. They have seen their eye doctor in the last year. They have no  hearing difficulty with regard to whispered voices .  They have deferred audiologic testing in the last year.  They do not  have excessive sun exposure. Discussed the need for sun protection: hats, long sleeves and use of sunscreen if there is significant sun exposure.   Diet: the importance of a healthy diet is discussed. They do not have a healthy diet.  The benefits of regular aerobic exercise were discussed. She does not exercise regularly .  Depression screen: there are no signs or vegative symptoms of depression- irritability, change in appetite, anhedonia, sadness/tearfullness.  Cognitive assessment: the patient manages all their financial and personal affairs and is actively engaged. They could relate day,date,year and events; recalled 2/3 objects at 3 minutes; performed clock-face  test normally.  The following portions of the patient's history were reviewed and updated as appropriate: allergies, current medications, past family history, past medical history,  past surgical history, past social history  and problem list.  Visual acuity was not assessed per patient preference since she has regular follow up with her ophthalmologist. Hearing and body mass index were assessed and reviewed.   During the course of the visit the patient was educated and counseled about appropriate screening and preventive services including : fall prevention , diabetes screening, nutrition counseling, colorectal cancer screening, and recommended immunizations.    CC: The primary encounter diagnosis was Routine general medical examination at a health care facility. Diagnoses of Impaired fasting glucose, Elevated liver enzymes, Obesity (BMI 30-39.9), Hyperlipidemia, Other fatigue, Tobacco abuse, Sciatica, left, Encounter for preventive health examination, and Screening for colon cancer were also pertinent to this visit. Still smoking cigarettes daily,  No interest in quitting until she changes jobs and has a lower stress level.  Gaining weight.   Not exercising. Changing jobs in late August,  Going to work at [employer] back pain radiates to left lateral knee.  Currently using ibuprofen  Per Dr. Marry Guan,  Wants to resume celebrex.      History Goldye has a past medical history of Esophagitis, reflux (2010 ) and Diverticulosis of intestine (2010).   She has past surgical history that includes Joint replacement (2012); Appendectomy; and Abdominal hysterectomy (2009).   Her family history includes Cancer in her father, maternal grandfather, paternal aunt,  paternal grandfather, and paternal grandmother; Diabetes in her maternal grandfather and sister; Hyperlipidemia in her father. There is no history of Heart disease.She reports that she has been smoking Cigarettes.  She has never used smokeless  tobacco. She reports that she drinks alcohol. She reports that she does not use illicit drugs.  Outpatient Prescriptions Prior to Visit  Medication Sig Dispense Refill  . ALPRAZolam (XANAX) 0.5 MG tablet Take 1 tablet (0.5 mg total) by mouth 3 (three) times daily as needed for anxiety. 90 tablet 0  . aspirin 81 MG tablet Take 81 mg by mouth daily.    . cetirizine (ZYRTEC) 10 MG tablet Take 10 mg by mouth daily.    . citalopram (CELEXA) 40 MG tablet Take 1 tablet (40 mg total) by mouth daily. 90 tablet 3  . conjugated estrogens (PREMARIN) vaginal cream Place 1 Applicatorful vaginally daily. 42.5 g 12  . methocarbamol (ROBAXIN-750) 750 MG tablet Take 1 tablet (750 mg total) by mouth 4 (four) times daily. As needed for muscle spasm 90 tablet 2  . Multiple Vitamin (MULTIVITAMIN) tablet Take 1 tablet by mouth daily.    . Omega-3 Fatty Acids (FISH OIL) 1000 MG CAPS Take 1 capsule by mouth daily.    . pantoprazole (PROTONIX) 40 MG tablet TAKE ONE TABLET BY MOUTH 2 TIMES A DAY 60 tablet 11  . spironolactone (ALDACTONE) 50 MG tablet TAKE ONE TABLET BY MOUTH DAILY AS NEEDED 90 tablet 1  . traMADol (ULTRAM) 50 MG tablet TAKE ONE TO TWO TABLETS BY MOUTH EVERY 6 HOURS AS NEEDED FOR SEVERE BACK PAIN (MAX 6 TABS/DAILY) 180 tablet 3  . celecoxib (CELEBREX) 200 MG capsule TAKE 1 CAPSULE BY MOUTH 2 TIMES DAILY. 60 capsule 3  . albuterol (PROVENTIL HFA;VENTOLIN HFA) 108 (90 BASE) MCG/ACT inhaler Inhale 2 puffs into the lungs every 6 (six) hours as needed for wheezing or shortness of breath. (Patient not taking: Reported on 01/26/2015) 1 Inhaler 0  . amoxicillin-clavulanate (AUGMENTIN) 875-125 MG per tablet Take 1 tablet by mouth 2 (two) times daily. (Patient not taking: Reported on 01/26/2015) 14 tablet 0  . benzonatate (TESSALON) 100 MG capsule Take 1-2 capsules (100-200 mg total) by mouth every 8 (eight) hours as needed for cough. (Patient not taking: Reported on 01/26/2015) 21 capsule 0  . nicotine (NICODERM CQ) 21  mg/24hr patch Place 1 patch (21 mg total) onto the skin daily. (Patient not taking: Reported on 01/26/2015) 28 patch 3   No facility-administered medications prior to visit.    Review of Systems   Patient denies headache, fevers, malaise, unintentional weight loss, skin rash, eye pain, sinus congestion and sinus pain, sore throat, dysphagia,  hemoptysis , cough, dyspnea, wheezing, chest pain, palpitations, orthopnea, edema, abdominal pain, nausea, melena, diarrhea, constipation, flank pain, dysuria, hematuria, urinary  Frequency, nocturia, numbness, tingling, seizures,  Focal weakness, Loss of consciousness,  Tremor, insomnia, depression, anxiety, and suicidal ideation.      Objective:  BP 130/82 mmHg  Pulse 74  Temp(Src) 98.1 F (36.7 C) (Oral)  Resp 16  Ht 5\' 9"  (1.753 m)  Wt 277 lb 6.4 oz (125.828 kg)  BMI 40.95 kg/m2  SpO2 98%  Physical Exam  General appearance: alert, cooperative and appears stated age Head: Normocephalic, without obvious abnormality, atraumatic Eyes: conjunctivae/corneas clear. PERRL, EOM's intact. Fundi benign. Ears: normal TM's and external ear canals both ears Nose: Nares normal. Septum midline. Mucosa normal. No drainage or sinus tenderness. Throat: lips, mucosa, and tongue normal; teeth and gums normal Neck:  no adenopathy, no carotid bruit, no JVD, supple, symmetrical, trachea midline and thyroid not enlarged, symmetric, no tenderness/mass/nodules Lungs: clear to auscultation bilaterally Breasts: normal appearance, no masses or tenderness Heart: regular rate and rhythm, S1, S2 normal, no murmur, click, rub or gallop Abdomen: soft, non-tender; bowel sounds normal; no masses,  no organomegaly Extremities: extremities normal, atraumatic, no cyanosis or edema Pulses: 2+ and symmetric Skin: Skin color, texture, turgor normal. No rashes or lesions Neurologic: Alert and oriented X 3, normal strength and tone. Normal symmetric reflexes. Normal coordination and  gait.    Assessment & Plan:   Problem List Items Addressed This Visit      Unprioritized   Obesity (BMI 30-39.9)    I have addressed  BMI and recommended wt loss of 10% of body weigh over the next 6 months using a low glycemic index diet and regular exercise a minimum of 5 days per week.        Impaired fasting glucose    Encouraged weight, regular participation in aerobic exercise and adherence to a low glycemic index diet.  A1c is still < 6.0   Lab Results  Component Value Date   HGBA1C 5.8 01/26/2015   Lab Results  Component Value Date   MICROALBUR 1.2 03/07/2014             Relevant Orders   Comprehensive metabolic panel (Completed)   Lipid panel (Completed)   Hemoglobin A1c (Completed)   Tobacco abuse    Risks of continued tobacco use were discussed. She is not currently interested in tobacco cessation until she changes jobs to a less stressful environment.       Hyperlipidemia    She did not tolerate Tricor due to moderate myalgias.   repeat  triglycerides are < 400 but she is not following a low GI diet , exercising or taking  fish oil   Lab Results  Component Value Date   CHOL 259* 01/26/2015   HDL 42.80 01/26/2015   LDLDIRECT 171.0 01/26/2015   TRIG 346.0* 01/26/2015   CHOLHDL 6 01/26/2015         Relevant Orders   TSH (Completed)   Sciatica    Improved,  Left side  Resume celebrex,  Advised to lose weight.       Encounter for preventive health examination    Annual wellness  exam was done as well as a comprehensive physical exam  .  During the course of the visit the patient was educated and counseled about appropriate screening and preventive services and screenings were brought up to date for cervical and breast cancer .   nutrition counseling, skin cancer screening has been recommended, along with review of the age appropriate recommended immunizations.  Printed recommendations for health maintenance screenings was given.        Elevated  liver enzymes    Mild persistent ALT elevation. Ultrasound advised.  Serologies for autoimmune etiologies were negative,  History of Hep C infection, cleared via prior workup.  Lab Results  Component Value Date   ALT 50* 01/26/2015   AST 26 01/26/2015   ALKPHOS 44 01/26/2015   BILITOT 0.4 01/26/2015          Relevant Orders   Comprehensive metabolic panel (Completed)   US Abdomen Limited RUQ   Routine general medical examination at a health care facility - Primary    Other Visit Diagnoses    Other fatigue        Relevant Orders    CBC  with Differential/Platelet (Completed)    Screening for colon cancer        Relevant Orders    Ambulatory referral to Gastroenterology       I have discontinued Ms. Stoy's nicotine, amoxicillin-clavulanate, benzonatate, and albuterol. I am also having her maintain her multivitamin, aspirin, cetirizine, ALPRAZolam, Fish Oil, conjugated estrogens, methocarbamol, spironolactone, citalopram, pantoprazole, traMADol, and celecoxib.  Meds ordered this encounter  Medications  . celecoxib (CELEBREX) 200 MG capsule    Sig: TAKE 1 CAPSULE BY MOUTH 2 TIMES DAILY.    Dispense:  180 capsule    Refill:  1    KEEP ON FILE FOR FUTURE REFILLS    Medications Discontinued During This Encounter  Medication Reason  . celecoxib (CELEBREX) 200 MG capsule Reorder  . nicotine (NICODERM CQ) 21 mg/24hr patch   . albuterol (PROVENTIL HFA;VENTOLIN HFA) 108 (90 BASE) MCG/ACT inhaler   . amoxicillin-clavulanate (AUGMENTIN) 875-125 MG per tablet   . benzonatate (TESSALON) 100 MG capsule     Follow-up: No Follow-up on file.   Crecencio Mc, MD

## 2015-01-26 NOTE — Progress Notes (Signed)
Pre visit review using our clinic review tool, if applicable. No additional management support is needed unless otherwise documented below in the visit note. 

## 2015-01-27 LAB — TSH: TSH: 0.5 u[IU]/mL (ref 0.35–4.50)

## 2015-01-27 NOTE — Assessment & Plan Note (Signed)
Annual wellness  exam was done as well as a comprehensive physical exam  .  During the course of the visit the patient was educated and counseled about appropriate screening and preventive services and screenings were brought up to date for cervical and breast cancer .   nutrition counseling, skin cancer screening has been recommended, along with review of the age appropriate recommended immunizations.  Printed recommendations for health maintenance screenings was given.

## 2015-01-27 NOTE — Assessment & Plan Note (Signed)
Encouraged weight, regular participation in aerobic exercise and adherence to a low glycemic index diet.  A1c is still < 6.0   Lab Results  Component Value Date   HGBA1C 5.8 01/26/2015   Lab Results  Component Value Date   MICROALBUR 1.2 03/07/2014

## 2015-01-27 NOTE — Assessment & Plan Note (Signed)
Risks of continued tobacco use were discussed. She is not currently interested in tobacco cessation until she changes jobs to a less stressful environment.

## 2015-01-27 NOTE — Assessment & Plan Note (Signed)
Improved,  Left side  Resume celebrex,  Advised to lose weight.

## 2015-01-27 NOTE — Assessment & Plan Note (Addendum)
Mild persistent ALT elevation. Ultrasound advised.  Serologies for autoimmune etiologies were negative,  History of Hep C infection, cleared via prior workup.  Lab Results  Component Value Date   ALT 50* 01/26/2015   AST 26 01/26/2015   ALKPHOS 44 01/26/2015   BILITOT 0.4 01/26/2015

## 2015-01-27 NOTE — Assessment & Plan Note (Signed)
I have addressed  BMI and recommended wt loss of 10% of body weigh over the next 6 months using a low glycemic index diet and regular exercise a minimum of 5 days per week.   

## 2015-01-27 NOTE — Assessment & Plan Note (Signed)
She did not tolerate Tricor due to moderate myalgias.   repeat  triglycerides are < 400 but she is not following a low GI diet , exercising or taking  fish oil   Lab Results  Component Value Date   CHOL 259* 01/26/2015   HDL 42.80 01/26/2015   LDLDIRECT 171.0 01/26/2015   TRIG 346.0* 01/26/2015   CHOLHDL 6 01/26/2015

## 2015-01-28 ENCOUNTER — Encounter: Payer: Self-pay | Admitting: Internal Medicine

## 2015-01-29 ENCOUNTER — Ambulatory Visit: Payer: 59

## 2015-01-29 ENCOUNTER — Encounter: Payer: Self-pay | Admitting: Internal Medicine

## 2015-01-29 NOTE — Addendum Note (Signed)
Addended by: Crecencio Mc on: 01/29/2015 12:30 PM   Modules accepted: Miquel Dunn

## 2015-02-02 ENCOUNTER — Ambulatory Visit
Admission: RE | Admit: 2015-02-02 | Discharge: 2015-02-02 | Disposition: A | Discharge status: Home / Self Care | Payer: 59 | Source: Ambulatory Visit | Attending: Internal Medicine | Admitting: Internal Medicine

## 2015-02-02 DIAGNOSIS — K760 Fatty (change of) liver, not elsewhere classified: Secondary | ICD-10-CM | POA: Diagnosis not present

## 2015-02-02 DIAGNOSIS — R748 Abnormal levels of other serum enzymes: Secondary | ICD-10-CM | POA: Diagnosis present

## 2015-02-03 ENCOUNTER — Encounter: Payer: Self-pay | Admitting: Internal Medicine

## 2015-02-04 ENCOUNTER — Encounter: Payer: Self-pay | Admitting: Internal Medicine

## 2015-04-10 ENCOUNTER — Encounter: Payer: Self-pay | Admitting: Internal Medicine

## 2015-04-10 MED ORDER — CITALOPRAM HYDROBROMIDE 40 MG PO TABS: ORAL_TABLET | ORAL | Status: DC

## 2015-04-10 MED ORDER — PANTOPRAZOLE SODIUM 40 MG PO TBEC: DELAYED_RELEASE_TABLET | ORAL | Status: DC

## 2015-04-10 MED ORDER — TRAMADOL HCL 50 MG PO TABS: ORAL_TABLET | ORAL | Status: DC

## 2015-04-10 MED ORDER — SPIRONOLACTONE 50 MG PO TABS: 50.0000 mg | ORAL_TABLET | Freq: Every day | ORAL | Status: DC | PRN

## 2015-04-10 NOTE — Telephone Encounter (Signed)
Ok to fill Tramadol and Celexa for 90 days?

## 2015-05-06 ENCOUNTER — Encounter: Payer: Self-pay | Admitting: Internal Medicine

## 2015-05-06 ENCOUNTER — Ambulatory Visit (INDEPENDENT_AMBULATORY_CARE_PROVIDER_SITE_OTHER): Payer: 59 | Admitting: Internal Medicine

## 2015-05-06 VITALS — BP 108/76 | HR 77 | Temp 98.2°F | Resp 14 | Ht 69.0 in | Wt 279.1 lb

## 2015-05-06 DIAGNOSIS — Z1211 Encounter for screening for malignant neoplasm of colon: Secondary | ICD-10-CM

## 2015-05-06 DIAGNOSIS — E669 Obesity, unspecified: Secondary | ICD-10-CM

## 2015-05-06 DIAGNOSIS — E785 Hyperlipidemia, unspecified: Secondary | ICD-10-CM

## 2015-05-06 DIAGNOSIS — R768 Other specified abnormal immunological findings in serum: Secondary | ICD-10-CM

## 2015-05-06 DIAGNOSIS — R7301 Impaired fasting glucose: Secondary | ICD-10-CM

## 2015-05-06 DIAGNOSIS — K760 Fatty (change of) liver, not elsewhere classified: Secondary | ICD-10-CM

## 2015-05-06 DIAGNOSIS — R894 Abnormal immunological findings in specimens from other organs, systems and tissues: Secondary | ICD-10-CM

## 2015-05-06 DIAGNOSIS — E8881 Metabolic syndrome: Secondary | ICD-10-CM

## 2015-05-06 DIAGNOSIS — Z716 Tobacco abuse counseling: Secondary | ICD-10-CM

## 2015-05-06 LAB — COMPREHENSIVE METABOLIC PANEL
ALT: 61 U/L — ABNORMAL HIGH (ref 0–35)
AST: 30 U/L (ref 0–37)
Albumin: 4.5 g/dL (ref 3.5–5.2)
Alkaline Phosphatase: 41 U/L (ref 39–117)
BUN: 11 mg/dL (ref 6–23)
CO2: 29 mEq/L (ref 19–32)
Calcium: 10.5 mg/dL (ref 8.4–10.5)
Chloride: 101 mEq/L (ref 96–112)
Creatinine, Ser: 0.73 mg/dL (ref 0.40–1.20)
GFR: 88.19 mL/min (ref >60.00)
Glucose, Bld: 107 mg/dL — ABNORMAL HIGH (ref 70–99)
Potassium: 4.4 mEq/L (ref 3.5–5.1)
Sodium: 141 mEq/L (ref 135–145)
Total Bilirubin: 0.4 mg/dL (ref 0.2–1.2)
Total Protein: 7.4 g/dL (ref 6.0–8.3)

## 2015-05-06 LAB — LIPID PANEL
Cholesterol: 240 mg/dL — ABNORMAL HIGH (ref 0–200)
HDL: 47.2 mg/dL (ref >39.00)
NonHDL: 193.12
Total CHOL/HDL Ratio: 5
Triglycerides: 270 mg/dL — ABNORMAL HIGH (ref 0.0–149.0)
VLDL: 54 mg/dL — ABNORMAL HIGH (ref 0.0–40.0)

## 2015-05-06 LAB — HEMOGLOBIN A1C: Hgb A1c MFr Bld: 5.9 % (ref 4.6–6.5)

## 2015-05-06 LAB — LDL CHOLESTEROL, DIRECT: Direct LDL: 148 mg/dL

## 2015-05-06 MED ORDER — PHENTERMINE HCL 37.5 MG PO TABS: ORAL_TABLET | ORAL | Status: DC

## 2015-05-06 MED ORDER — VARENICLINE TARTRATE 1 MG PO TABS: 1.0000 mg | ORAL_TABLET | Freq: Two times a day (BID) | ORAL | Status: DC

## 2015-05-06 MED ORDER — VARENICLINE TARTRATE 0.5 MG X 11 & 1 MG X 42 PO MISC: ORAL | Status: DC

## 2015-05-06 NOTE — Progress Notes (Signed)
Pre-visit discussion using our clinic review tool. No additional management support is needed unless otherwise documented below in the visit note.  

## 2015-05-06 NOTE — Patient Instructions (Signed)
I'm so glad you are going to try Chantix again!   Make sure you set a date IN ADVANCE and let your family and friends know so they cna be supportive  I recommend a minimum of 3 monthsn f chantix before you stop,  To give you tie to build good habits   I have authorized the use of phentermine for 3 months  and a  return to see me in 3 months. Goal weight loss is 5% of today's weight by then  (14 lbs)  or phentermine will be discontinued.    Get your BP checked after a few days to make sure it is not raising it

## 2015-05-06 NOTE — Progress Notes (Signed)
Subjective:  Patient ID: Lisa Valencia, female    DOB: Aug 02, 1960  Age: 54 y.o. MRN: [id number]  CC: The primary encounter diagnosis was Metabolic syndrome. Diagnoses of Screening for colon cancer, Impaired fasting glucose, Hyperlipidemia, Hepatitis C antibody test positive, Hepatic steatosis, Tobacco abuse counseling, and Obesity (BMI 30-39.9) were also pertinent to this visit.  HPI Lisa Valencia presents for follow up on metabolic syndrome .  She has gained 2 lbs since July and her BMI is now 39.  She is not exercising or following a low GI diet despite changing to a less stressful job, due to a schedule change.  She is now working 2nd shift at [employer] .  Still smoking..  Wants to quit.  History of  remote trial of chantix but it caused irritability    Outpatient Prescriptions Prior to Visit  Medication Sig Dispense Refill  . ALPRAZolam (XANAX) 0.5 MG tablet Take 1 tablet (0.5 mg total) by mouth 3 (three) times daily as needed for anxiety. 90 tablet 0  . aspirin 81 MG tablet Take 81 mg by mouth daily.    . celecoxib (CELEBREX) 200 MG capsule TAKE 1 CAPSULE BY MOUTH 2 TIMES DAILY. 180 capsule 1  . cetirizine (ZYRTEC) 10 MG tablet Take 10 mg by mouth daily.    . citalopram (CELEXA) 40 MG tablet Take 1 tablet (40 mg total) by mouth daily. 90 tablet 1  . conjugated estrogens (PREMARIN) vaginal cream Place 1 Applicatorful vaginally daily. 42.5 g 12  . methocarbamol (ROBAXIN-750) 750 MG tablet Take 1 tablet (750 mg total) by mouth 4 (four) times daily. As needed for muscle spasm 90 tablet 2  . Multiple Vitamin (MULTIVITAMIN) tablet Take 1 tablet by mouth daily.    . Omega-3 Fatty Acids (FISH OIL) 1000 MG CAPS Take 1 capsule by mouth daily.    . pantoprazole (PROTONIX) 40 MG tablet TAKE ONE TABLET BY MOUTH 2 TIMES A DAY 180 tablet 2  . spironolactone (ALDACTONE) 50 MG tablet Take 1 tablet (50 mg total) by mouth daily as needed. 90 tablet 1  . traMADol (ULTRAM) 50 MG tablet TAKE  ONE TO TWO TABLETS BY MOUTH EVERY 6 HOURS AS NEEDED FOR SEVERE BACK PAIN (MAX 6 TABS/DAILY) 180 tablet 5   No facility-administered medications prior to visit.    Review of Systems;  Patient denies headache, fevers, malaise, unintentional weight loss, skin rash, eye pain, sinus congestion and sinus pain, sore throat, dysphagia,  hemoptysis , cough, dyspnea, wheezing, chest pain, palpitations, orthopnea, edema, abdominal pain, nausea, melena, diarrhea, constipation, flank pain, dysuria, hematuria, urinary  Frequency, nocturia, numbness, tingling, seizures,  Focal weakness, Loss of consciousness,  Tremor, insomnia, depression, anxiety, and suicidal ideation.      Objective:  BP 108/76 mmHg  Pulse 77  Temp(Src) 98.2 F (36.8 C) (Oral)  Resp 14  Ht 5\' 9"  (1.753 m)  Wt 279 lb 2 oz (126.61 kg)  BMI 41.20 kg/m2  SpO2 98%  BP Readings from Last 3 Encounters:  05/06/15 108/76  01/26/15 130/82  03/05/14 128/78    Wt Readings from Last 3 Encounters:  05/06/15 279 lb 2 oz (126.61 kg)  01/26/15 277 lb 6.4 oz (125.828 kg)  03/05/14 277 lb 8 oz (125.873 kg)    General appearance: alert, cooperative and appears stated age Ears: normal TM's and external ear canals both ears Throat: lips, mucosa, and tongue normal; teeth and gums normal Neck: no adenopathy, no carotid bruit, supple, symmetrical, trachea midline and thyroid  not enlarged, symmetric, no tenderness/mass/nodules Back: symmetric, no curvature. ROM normal. No CVA tenderness. Lungs: clear to auscultation bilaterally Heart: regular rate and rhythm, S1, S2 normal, no murmur, click, rub or gallop Abdomen: soft, non-tender; bowel sounds normal; no masses,  no organomegaly Pulses: 2+ and symmetric Skin: Skin color, texture, turgor normal. No rashes or lesions Lymph nodes: Cervical, supraclavicular, and axillary nodes normal.  Lab Results  Component Value Date   HGBA1C 5.9 05/06/2015   HGBA1C 5.8 01/26/2015   HGBA1C 6.0 03/07/2014      Lab Results  Component Value Date   CREATININE 0.73 05/06/2015   CREATININE 0.73 01/26/2015   CREATININE 0.8 03/07/2014    Lab Results  Component Value Date   WBC 7.4 01/26/2015   HGB 14.7 01/26/2015   HCT 43.7 01/26/2015   PLT 211.0 01/26/2015   GLUCOSE 107* 05/06/2015   CHOL 240* 05/06/2015   TRIG 270.0* 05/06/2015   HDL 47.20 05/06/2015   LDLDIRECT 148.0 05/06/2015   LDLCALC 160* 05/17/2012   ALT 61* 05/06/2015   AST 30 05/06/2015   NA 141 05/06/2015   K 4.4 05/06/2015   CL 101 05/06/2015   CREATININE 0.73 05/06/2015   BUN 11 05/06/2015   CO2 29 05/06/2015   TSH 0.50 01/26/2015   HGBA1C 5.9 05/06/2015   MICROALBUR 1.2 03/07/2014    US Abdomen Limited Ruq  02/02/2015  CLINICAL DATA:  Elevated liver function tests for 18 months. EXAM: US ABDOMEN LIMITED - RIGHT UPPER QUADRANT COMPARISON:  None. FINDINGS: Gallbladder: No gallstones or wall thickening visualized. No sonographic Murphy sign noted. Common bile duct: Diameter: 4 mm. Limited visualization with no evidence of filling defect. Liver: Echogenic liver with poor acoustic transmission. Sparing is noted around the gallbladder fossa. No focal lesion. Antegrade flow in the imaged portal venous system. IMPRESSION: Hepatic steatosis. Electronically Signed   By: Monte Fantasia M.D.   On: 02/02/2015 14:41    Assessment & Plan:   Problem List Items Addressed This Visit    Obesity (BMI 30-39.9)    I have addressed  BMI and recommended wt loss of 10% of body weight over the next 6 months using a low glycemic index diet and regular exercise a minimum of 5 days per week. I have authorized the use of phentermine for 3 months.        Relevant Medications   phentermine (ADIPEX-P) 37.5 MG tablet   Impaired fasting glucose    Encouraged weight, regular participation in aerobic exercise and adherence to a low glycemic index diet.  A1c is still < 6.0   Lab Results  Component Value Date   HGBA1C 5.9 05/06/2015   Lab  Results  Component Value Date   MICROALBUR 1.2 03/07/2014               Tobacco abuse counseling    Risks of continued tobacco use were discussed. She is willing to consider a retrial of Chantix       Hyperlipidemia    Improvement in triglycerides to < 300,  But elevated LDL.  Will repeat in 3 months and add statin for LDL > 100.  Lab Results  Component Value Date   CHOL 240* 05/06/2015   HDL 47.20 05/06/2015   LDLCALC 160* 05/17/2012   LDLDIRECT 148.0 05/06/2015   TRIG 270.0* 05/06/2015   CHOLHDL 5 05/06/2015         Hepatic steatosis    Suggested by ultrasound, Mild persistent ALT elevation, and negative autoimmune serologies.  Lab Results  Component Value Date   ALT 61* 05/06/2015   AST 30 05/06/2015   ALKPHOS 41 05/06/2015   BILITOT 0.4 05/06/2015            Hepatitis C antibody test positive    With negative viral load.        Other Visit Diagnoses    Metabolic syndrome    -  Primary    Relevant Orders    Comprehensive metabolic panel (Completed)    Hemoglobin A1c (Completed)    LDL cholesterol, direct (Completed)    Lipid panel (Completed)    Screening for colon cancer        Relevant Orders    Ambulatory referral to Gastroenterology      A total of 40 minutes was spent with patient more than half of which was spent in counseling patient on the above mentioned issues , reviewing and explaining recent labs and imaging studies done, and coordination of care. I am having Ms. Holbein start on varenicline, phentermine, and varenicline. I am also having her maintain her multivitamin, aspirin, cetirizine, ALPRAZolam, Fish Oil, conjugated estrogens, methocarbamol, celecoxib, traMADol, citalopram, spironolactone, and pantoprazole.  Meds ordered this encounter  Medications  . varenicline (CHANTIX STARTING MONTH PAK) 0.5 MG X 11 & 1 MG X 42 tablet    Sig: Take one 0.5 mg tablet by mouth once daily for 3 days, then increase to one 0.5 mg tablet twice  daily for 4 days, then increase to one 1 mg tablet twice daily.    Dispense:  53 tablet    Refill:  0  . phentermine (ADIPEX-P) 37.5 MG tablet    Sig: 1/2 tablet in the am and afternoon    Dispense:  30 tablet    Refill:  2  . varenicline (CHANTIX CONTINUING MONTH PAK) 1 MG tablet    Sig: Take 1 tablet (1 mg total) by mouth 2 (two) times daily.    Dispense:  60 tablet    Refill:  3    There are no discontinued medications.  Follow-up: Return in about 3 months (around 08/06/2015).   Crecencio Mc, MD

## 2015-05-07 ENCOUNTER — Encounter: Payer: Self-pay | Admitting: Internal Medicine

## 2015-05-07 NOTE — Assessment & Plan Note (Signed)
Improvement in triglycerides to < 300,  But elevated LDL.  Will repeat in 3 months and add statin for LDL > 100.  Lab Results  Component Value Date   CHOL 240* 05/06/2015   HDL 47.20 05/06/2015   LDLCALC 160* 05/17/2012   LDLDIRECT 148.0 05/06/2015   TRIG 270.0* 05/06/2015   CHOLHDL 5 05/06/2015

## 2015-05-07 NOTE — Assessment & Plan Note (Addendum)
Suggested by ultrasound, Mild persistent ALT elevation, and negative autoimmune serologies.   Lab Results  Component Value Date   ALT 61* 05/06/2015   AST 30 05/06/2015   ALKPHOS 41 05/06/2015   BILITOT 0.4 05/06/2015

## 2015-05-07 NOTE — Assessment & Plan Note (Signed)
With negative viral load.

## 2015-05-07 NOTE — Assessment & Plan Note (Signed)
I have addressed  BMI and recommended wt loss of 10% of body weight over the next 6 months using a low glycemic index diet and regular exercise a minimum of 5 days per week. I have authorized the use of phentermine for 3 months.

## 2015-05-07 NOTE — Assessment & Plan Note (Signed)
Encouraged weight, regular participation in aerobic exercise and adherence to a low glycemic index diet.  A1c is still < 6.0   Lab Results  Component Value Date   HGBA1C 5.9 05/06/2015   Lab Results  Component Value Date   MICROALBUR 1.2 03/07/2014

## 2015-05-07 NOTE — Assessment & Plan Note (Signed)
Risks of continued tobacco use were discussed. She is willing to consider a retrial of Chantix

## 2015-05-14 ENCOUNTER — Telehealth: Payer: Self-pay | Admitting: Gastroenterology

## 2015-05-14 NOTE — Telephone Encounter (Signed)
Colonoscopy triage °

## 2015-05-15 ENCOUNTER — Ambulatory Visit: Payer: Self-pay | Admitting: Internal Medicine

## 2015-05-15 NOTE — Telephone Encounter (Signed)
LVM for pt to return my call.

## 2015-05-19 ENCOUNTER — Other Ambulatory Visit: Payer: Self-pay

## 2015-05-19 DIAGNOSIS — Z1211 Encounter for screening for malignant neoplasm of colon: Secondary | ICD-10-CM

## 2015-05-19 MED ORDER — SOD PICOSULFATE-MAG OX-CIT ACD 10-3.5-12 MG-GM-GM PO PACK: 1.0000 | PACK | ORAL | Status: DC

## 2015-05-19 NOTE — Telephone Encounter (Signed)
Gastroenterology Pre-Procedure Review  Request Date: 06/05/15 Requesting Physician: Dr. Derrel Nip  PATIENT REVIEW QUESTIONS: The patient responded to the following health history questions as indicated:    1. Are you having any GI issues? no 2. Do you have a personal history of Polyps? yes (one was removed but benign) 3. Do you have a family history of Colon Cancer or Polyps? yes (both grandparents had colon cancer) 4. Diabetes Mellitus? no 5. Joint replacements in the past 12 months? 4 years ago, Total hip replacement RT 6. Major health problems in the past 3 months?no 7. Any artificial heart valves, MVP, or defibrillator?no    MEDICATIONS & ALLERGIES:    Patient reports the following regarding taking any anticoagulation/antiplatelet therapy:   Plavix, Coumadin, Eliquis, Xarelto, Lovenox, Pradaxa, Brilinta, or Effient? no Aspirin? yes (ASA 81mg )  Patient confirms/reports the following medications:  Current Outpatient Prescriptions  Medication Sig Dispense Refill  . ALPRAZolam (XANAX) 0.5 MG tablet Take 1 tablet (0.5 mg total) by mouth 3 (three) times daily as needed for anxiety. 90 tablet 0  . aspirin 81 MG tablet Take 81 mg by mouth daily.    . celecoxib (CELEBREX) 200 MG capsule TAKE 1 CAPSULE BY MOUTH 2 TIMES DAILY. 180 capsule 1  . cetirizine (ZYRTEC) 10 MG tablet Take 10 mg by mouth daily.    . citalopram (CELEXA) 40 MG tablet Take 1 tablet (40 mg total) by mouth daily. 90 tablet 1  . conjugated estrogens (PREMARIN) vaginal cream Place 1 Applicatorful vaginally daily. 42.5 g 12  . methocarbamol (ROBAXIN-750) 750 MG tablet Take 1 tablet (750 mg total) by mouth 4 (four) times daily. As needed for muscle spasm 90 tablet 2  . Multiple Vitamin (MULTIVITAMIN) tablet Take 1 tablet by mouth daily.    . Omega-3 Fatty Acids (FISH OIL) 1000 MG CAPS Take 1 capsule by mouth daily.    . pantoprazole (PROTONIX) 40 MG tablet TAKE ONE TABLET BY MOUTH 2 TIMES A DAY 180 tablet 2  . phentermine  (ADIPEX-P) 37.5 MG tablet 1/2 tablet in the am and afternoon 30 tablet 2  . spironolactone (ALDACTONE) 50 MG tablet Take 1 tablet (50 mg total) by mouth daily as needed. 90 tablet 1  . traMADol (ULTRAM) 50 MG tablet TAKE ONE TO TWO TABLETS BY MOUTH EVERY 6 HOURS AS NEEDED FOR SEVERE BACK PAIN (MAX 6 TABS/DAILY) 180 tablet 5  . varenicline (CHANTIX CONTINUING MONTH PAK) 1 MG tablet Take 1 tablet (1 mg total) by mouth 2 (two) times daily. 60 tablet 3  . varenicline (CHANTIX STARTING MONTH PAK) 0.5 MG X 11 & 1 MG X 42 tablet Take one 0.5 mg tablet by mouth once daily for 3 days, then increase to one 0.5 mg tablet twice daily for 4 days, then increase to one 1 mg tablet twice daily. 53 tablet 0   No current facility-administered medications for this visit.    Patient confirms/reports the following allergies:  Allergies  Allergen Reactions  . Fenofibrate     Muscle aches  . Sulfa Antibiotics   . Wellbutrin [Bupropion]     No orders of the defined types were placed in this encounter.    AUTHORIZATION INFORMATION Primary Insurance: 1D#: Group #:  Secondary Insurance: 1D#: Group #:  SCHEDULE INFORMATION: Date: 06/05/15 Time: Location: [place]

## 2015-05-29 ENCOUNTER — Encounter: Payer: Self-pay | Admitting: *Deleted

## 2015-06-04 NOTE — Discharge Instructions (Signed)

## 2015-06-05 ENCOUNTER — Ambulatory Visit: Payer: 59 | Admitting: Anesthesiology

## 2015-06-05 ENCOUNTER — Encounter
Admission: RE | Disposition: A | Discharge status: Home / Self Care | Payer: Self-pay | Source: Ambulatory Visit | Attending: Gastroenterology

## 2015-06-05 ENCOUNTER — Other Ambulatory Visit: Payer: Self-pay | Admitting: Gastroenterology

## 2015-06-05 ENCOUNTER — Ambulatory Visit
Admission: RE | Admit: 2015-06-05 | Discharge: 2015-06-05 | Disposition: A | Discharge status: Home / Self Care | Payer: 59 | Source: Ambulatory Visit | Attending: Gastroenterology | Admitting: Gastroenterology

## 2015-06-05 DIAGNOSIS — Z90710 Acquired absence of both cervix and uterus: Secondary | ICD-10-CM | POA: Diagnosis not present

## 2015-06-05 DIAGNOSIS — K5730 Diverticulosis of large intestine without perforation or abscess without bleeding: Secondary | ICD-10-CM | POA: Diagnosis not present

## 2015-06-05 DIAGNOSIS — Z8249 Family history of ischemic heart disease and other diseases of the circulatory system: Secondary | ICD-10-CM | POA: Diagnosis not present

## 2015-06-05 DIAGNOSIS — Z833 Family history of diabetes mellitus: Secondary | ICD-10-CM | POA: Insufficient documentation

## 2015-06-05 DIAGNOSIS — Z885 Allergy status to narcotic agent status: Secondary | ICD-10-CM | POA: Diagnosis not present

## 2015-06-05 DIAGNOSIS — Z79899 Other long term (current) drug therapy: Secondary | ICD-10-CM | POA: Insufficient documentation

## 2015-06-05 DIAGNOSIS — Z888 Allergy status to other drugs, medicaments and biological substances status: Secondary | ICD-10-CM | POA: Diagnosis not present

## 2015-06-05 DIAGNOSIS — Z6839 Body mass index (BMI) 39.0-39.9, adult: Secondary | ICD-10-CM | POA: Insufficient documentation

## 2015-06-05 DIAGNOSIS — F17210 Nicotine dependence, cigarettes, uncomplicated: Secondary | ICD-10-CM | POA: Diagnosis not present

## 2015-06-05 DIAGNOSIS — Z882 Allergy status to sulfonamides status: Secondary | ICD-10-CM | POA: Diagnosis not present

## 2015-06-05 DIAGNOSIS — Z800 Family history of malignant neoplasm of digestive organs: Secondary | ICD-10-CM | POA: Insufficient documentation

## 2015-06-05 DIAGNOSIS — K635 Polyp of colon: Secondary | ICD-10-CM | POA: Insufficient documentation

## 2015-06-05 DIAGNOSIS — Z801 Family history of malignant neoplasm of trachea, bronchus and lung: Secondary | ICD-10-CM | POA: Diagnosis not present

## 2015-06-05 DIAGNOSIS — D125 Benign neoplasm of sigmoid colon: Secondary | ICD-10-CM | POA: Diagnosis not present

## 2015-06-05 DIAGNOSIS — K210 Gastro-esophageal reflux disease with esophagitis: Secondary | ICD-10-CM | POA: Insufficient documentation

## 2015-06-05 DIAGNOSIS — Z7982 Long term (current) use of aspirin: Secondary | ICD-10-CM | POA: Diagnosis not present

## 2015-06-05 DIAGNOSIS — Z1211 Encounter for screening for malignant neoplasm of colon: Secondary | ICD-10-CM | POA: Insufficient documentation

## 2015-06-05 DIAGNOSIS — Z96649 Presence of unspecified artificial hip joint: Secondary | ICD-10-CM | POA: Diagnosis not present

## 2015-06-05 HISTORY — DX: Gastro-esophageal reflux disease without esophagitis: K21.9

## 2015-06-05 HISTORY — PX: COLONOSCOPY WITH PROPOFOL: SHX5780

## 2015-06-05 SURGERY — COLONOSCOPY WITH PROPOFOL
Anesthesia: Monitor Anesthesia Care | Wound class: Contaminated

## 2015-06-05 MED ORDER — FENTANYL CITRATE (PF) 100 MCG/2ML IJ SOLN: 25.0000 ug | INTRAMUSCULAR | Status: DC | PRN

## 2015-06-05 MED ORDER — LACTATED RINGERS IV SOLN
INTRAVENOUS | Status: DC
  Administered 2015-06-05: 08:00:00 via INTRAVENOUS

## 2015-06-05 MED ORDER — PROPOFOL 10 MG/ML IV BOLUS
INTRAVENOUS | Status: DC | PRN
  Administered 2015-06-05 (×3): 50 mg via INTRAVENOUS
  Administered 2015-06-05: 20 mg via INTRAVENOUS
  Administered 2015-06-05 (×4): 50 mg via INTRAVENOUS
  Administered 2015-06-05: 30 mg via INTRAVENOUS
  Administered 2015-06-05: 100 mg via INTRAVENOUS

## 2015-06-05 MED ORDER — STERILE WATER FOR IRRIGATION IR SOLN
Status: DC | PRN
  Administered 2015-06-05: 08:00:00

## 2015-06-05 MED ORDER — LIDOCAINE HCL (CARDIAC) 20 MG/ML IV SOLN
INTRAVENOUS | Status: DC | PRN
  Administered 2015-06-05: 40 mg via INTRAVENOUS

## 2015-06-05 SURGICAL SUPPLY — 30 items
CANISTER SUCT 1200ML W/VALVE (MISCELLANEOUS) ×2 IMPLANT
CLIP HMST 235XBRD CATH ROT (MISCELLANEOUS) ×1 IMPLANT
CLIP RESOLUTION 360 11X235 (MISCELLANEOUS) ×1
FCP ESCP3.2XJMB 240X2.8X (MISCELLANEOUS)
FORCEPS BIOP RAD 4 LRG CAP 4 (CUTTING FORCEPS) ×2 IMPLANT
FORCEPS BIOP RJ4 240 W/NDL (MISCELLANEOUS)
FORCEPS ESCP3.2XJMB 240X2.8X (MISCELLANEOUS) IMPLANT
GOWN CVR UNV OPN BCK APRN NK (MISCELLANEOUS) ×2 IMPLANT
GOWN ISOL THUMB LOOP REG UNIV (MISCELLANEOUS) ×2
HEMOCLIP INSTINCT (CLIP) IMPLANT
INJECTOR VARIJECT VIN23 (MISCELLANEOUS) IMPLANT
KIT CO2 TUBING (TUBING) IMPLANT
KIT DEFENDO VALVE AND CONN (KITS) IMPLANT
KIT ENDO PROCEDURE OLY (KITS) ×2 IMPLANT
LIGATOR MULTIBAND 6SHOOTER MBL (MISCELLANEOUS) IMPLANT
MARKER SPOT ENDO TATTOO 5ML (MISCELLANEOUS) IMPLANT
PAD GROUND ADULT SPLIT (MISCELLANEOUS) IMPLANT
SNARE SHORT THROW 13M SML OVAL (MISCELLANEOUS) IMPLANT
SNARE SHORT THROW 30M LRG OVAL (MISCELLANEOUS) IMPLANT
SPOT EX ENDOSCOPIC TATTOO (MISCELLANEOUS)
SUCTION POLY TRAP 4CHAMBER (MISCELLANEOUS) IMPLANT
TRAP SUCTION POLY (MISCELLANEOUS) IMPLANT
TUBING CONN 6MMX3.1M (TUBING)
TUBING SUCTION CONN 0.25 STRL (TUBING) IMPLANT
UNDERPAD 30X60 958B10 (PK) (MISCELLANEOUS) IMPLANT
VALVE BIOPSY ENDO (VALVE) IMPLANT
VARIJECT INJECTOR VIN23 (MISCELLANEOUS)
WATER AUXILLARY (MISCELLANEOUS) IMPLANT
WATER STERILE IRR 250ML POUR (IV SOLUTION) ×2 IMPLANT
WATER STERILE IRR 500ML POUR (IV SOLUTION) IMPLANT

## 2015-06-05 NOTE — Anesthesia Postprocedure Evaluation (Signed)
  Anesthesia Post-op Note  Patient: Lisa Valencia  Procedure(s) Performed: Procedure(s) with comments: COLONOSCOPY WITH PROPOFOL (N/A) - Pt requests early. Pt for antibiotic pre-procedure  CECAL PROTRUSION BX. / WITH CLIP LOT # [id number] RESOLUTION 360 EXP 03/03/2016 SIGMOID POLYP  Anesthesia type:MAC  Patient location: PACU  Post pain: Pain level controlled  Post assessment: Post-op Vital signs reviewed, Patient's Cardiovascular Status Stable, Respiratory Function Stable, Patent Airway and No signs of Nausea or vomiting  Post vital signs: Reviewed and stable  Last Vitals:  Filed Vitals:   06/05/15 0853  BP: 110/65  Pulse: 75  Temp:   Resp: 19    Level of consciousness: awake, alert  and patient cooperative  Complications: No apparent anesthesia complications

## 2015-06-05 NOTE — Anesthesia Procedure Notes (Signed)
Procedure Name: MAC Performed by: Salih Williamson Pre-anesthesia Checklist: Patient identified, Emergency Drugs available, Suction available, Timeout performed and Patient being monitored Patient Re-evaluated:Patient Re-evaluated prior to inductionOxygen Delivery Method: Nasal cannula Placement Confirmation: positive ETCO2     

## 2015-06-05 NOTE — Transfer of Care (Signed)
Immediate Anesthesia Transfer of Care Note  Patient: Lisa Valencia  Procedure(s) Performed: Procedure(s) with comments: COLONOSCOPY WITH PROPOFOL (N/A) - Pt requests early. Pt for antibiotic pre-procedure  CECAL PROTRUSION BX. / WITH CLIP LOT # [id number] RESOLUTION 360 EXP 03/03/2016 SIGMOID POLYP  Patient Location: PACU  Anesthesia Type: MAC  Level of Consciousness: awake, alert  and patient cooperative  Airway and Oxygen Therapy: Patient Spontanous Breathing and Patient connected to supplemental oxygen  Post-op Assessment: Post-op Vital signs reviewed, Patient's Cardiovascular Status Stable, Respiratory Function Stable, Patent Airway and No signs of Nausea or vomiting  Post-op Vital Signs: Reviewed and stable  Complications: No apparent anesthesia complications

## 2015-06-05 NOTE — Op Note (Signed)
[facility] Patient Name: Lisa Valencia Procedure Date: 06/05/2015 8:22 AM MRN: [id number] Account #: [id number] Date of Birth: Sep 08, 1960 Admit Type: Outpatient Age: 54 Room: [facility] OR ROOM 01 Gender: Female Note Status: Finalized Procedure:         Colonoscopy Indications:       Screening for colorectal malignant neoplasm Providers:         Lucilla Lame, MD Referring MD:      Deborra Medina, MD (Referring MD) Medicines:         Propofol per Anesthesia Complications:     No immediate complications. Procedure:         Pre-Anesthesia Assessment:                    - Prior to the procedure, a History and Physical was                     performed, and patient medications and allergies were                     reviewed. The patient's tolerance of previous anesthesia                     was also reviewed. The risks and benefits of the procedure                     and the sedation options and risks were discussed with the                     patient. All questions were answered, and informed consent                     was obtained. Prior Anticoagulants: The patient has taken                     no previous anticoagulant or antiplatelet agents. ASA                     Grade Assessment: II - A patient with mild systemic                     disease. After reviewing the risks and benefits, the                     patient was deemed in satisfactory condition to undergo                     the procedure.                    After obtaining informed consent, the colonoscope was                     passed under direct vision. Throughout the procedure, the                     patient's blood pressure, pulse, and oxygen saturations                     were monitored continuously. The Olympus CF H180AL                     colonoscope (S#: [id number]) was introduced through the anus  and advanced to the the cecum, identified by appendiceal           orifice and ileocecal valve. The colonoscopy was performed                     without difficulty. The patient tolerated the procedure                     well. The quality of the bowel preparation was excellent. Findings:      The perianal and digital rectal examinations were normal.      A 10 mm polypoid lesion was found at the appendiceal orifice. The lesion       was pedunculated. No bleeding was present. This was biopsied with a cold       forceps for histology. To prevent bleeding after the biopsy, one       hemostatic clip was successfully placed (MRI compatible). There was no       bleeding at the end of the procedure.      A 4 mm polyp was found in the sigmoid colon. The polyp was sessile. The       polyp was removed with a cold biopsy forceps. Resection and retrieval       were complete.      Multiple small-mouthed diverticula were found in the sigmoid colon. Impression:        - M/P invertid appendix polypoid lesion at the appendiceal                     orifice. Biopsied. MRI-compatible clip was placed.                    - One 4 mm polyp in the sigmoid colon. Resected and                     retrieved.                    - Diverticulosis in the sigmoid colon. Recommendation:    - Await pathology results.                    - Repeat colonoscopy in 5 years if polyp adenoma and 10                     years if hyperplastic Procedure Code(s): --- Professional ---                    [phone], Colonoscopy, flexible; with biopsy, single or                     multiple Diagnosis Code(s): --- Professional ---                    Z12.11, Encounter for screening for malignant neoplasm of                     colon                    D12.5, Benign neoplasm of sigmoid colon                    D49.0, Neoplasm of unspecified behavior of digestive system CPT copyright 2014 American Medical Association. All rights reserved. The codes documented in this report are preliminary and upon  coder review may  be revised  to meet current compliance requirements. Lucilla Lame, MD 06/05/2015 8:43:30 AM This report has been signed electronically. Number of Addenda: 0 Note Initiated On: 06/05/2015 8:22 AM Scope Withdrawal Time: 0 hours 9 minutes 15 seconds  Total Procedure Duration: 0 hours 13 minutes 32 seconds       [facility]

## 2015-06-05 NOTE — H&P (Signed)
[facility]  [address] [place], [place] [address] Phone: [phone] Fax : [phone]  Primary Care Physician:  Crecencio Mc, MD Primary Gastroenterologist:  Dr. Allen Norris  Pre-Procedure History & Physical: HPI:  Lisa Valencia is a 54 y.o. female is here for a screening colonoscopy.   Past Medical History  Diagnosis Date  . Esophagitis, reflux 2010     EGD by iftkihar, managed wiht dexilant  . Diverticulosis of intestine 2010    iftikhar  . GERD (gastroesophageal reflux disease)     Past Surgical History  Procedure Laterality Date  . Joint replacement  2012    hip, [name]  . Appendectomy    . Abdominal hysterectomy  2009    supracervical     Prior to Admission medications   Medication Sig Start Date End Date Taking? Authorizing Provider  ALPRAZolam Duanne Moron) 0.5 MG tablet Take 1 tablet (0.5 mg total) by mouth 3 (three) times daily as needed for anxiety. Patient not taking: Reported on 05/19/2015 04/29/13   Crecencio Mc, MD  aspirin 81 MG tablet Take 81 mg by mouth daily.    Historical Provider, MD  celecoxib (CELEBREX) 200 MG capsule TAKE 1 CAPSULE BY MOUTH 2 TIMES DAILY. 01/26/15   Crecencio Mc, MD  cetirizine (ZYRTEC) 10 MG tablet Take 10 mg by mouth daily.    Historical Provider, MD  citalopram (CELEXA) 40 MG tablet Take 1 tablet (40 mg total) by mouth daily. 04/10/15   Crecencio Mc, MD  conjugated estrogens (PREMARIN) vaginal cream Place 1 Applicatorful vaginally daily. 03/05/14   Crecencio Mc, MD  methocarbamol (ROBAXIN-750) 750 MG tablet Take 1 tablet (750 mg total) by mouth 4 (four) times daily. As needed for muscle spasm 03/05/14   Crecencio Mc, MD  Multiple Vitamin (MULTIVITAMIN) tablet Take 1 tablet by mouth daily.    Historical Provider, MD  Omega-3 Fatty Acids (FISH OIL) 1000 MG CAPS Take 1 capsule by mouth daily.    Historical Provider, MD  pantoprazole (PROTONIX) 40 MG tablet TAKE ONE TABLET BY MOUTH 2 TIMES A DAY 04/10/15    Crecencio Mc, MD  phentermine (ADIPEX-P) 37.5 MG tablet 1/2 tablet in the am and afternoon 05/06/15   Crecencio Mc, MD  Sod Picosulfate-Mag Ox-Cit Acd 10-3.5-12 MG-GM-GM PACK Take 1 Container by mouth as directed. 05/19/15   Lucilla Lame, MD  spironolactone (ALDACTONE) 50 MG tablet Take 1 tablet (50 mg total) by mouth daily as needed. 04/10/15   Crecencio Mc, MD  traMADol (ULTRAM) 50 MG tablet TAKE ONE TO TWO TABLETS BY MOUTH EVERY 6 HOURS AS NEEDED FOR SEVERE BACK PAIN (MAX 6 TABS/DAILY) 04/10/15   Crecencio Mc, MD  varenicline (CHANTIX CONTINUING MONTH PAK) 1 MG tablet Take 1 tablet (1 mg total) by mouth 2 (two) times daily. 05/06/15   Crecencio Mc, MD  varenicline (CHANTIX STARTING MONTH PAK) 0.5 MG X 11 & 1 MG X 42 tablet Take one 0.5 mg tablet by mouth once daily for 3 days, then increase to one 0.5 mg tablet twice daily for 4 days, then increase to one 1 mg tablet twice daily. 05/06/15   Crecencio Mc, MD    Allergies as of 05/19/2015 - Review Complete 05/06/2015  Allergen Reaction Noted  . Codeine sulfate Nausea Only 05/19/2015  . Fenofibrate  03/05/2014  . Sulfa antibiotics  01/25/2012  . Wellbutrin [bupropion]  01/25/2012  . Pseudoephedrine hcl Palpitations 05/19/2015    Family History  Problem Relation Age of Onset  . Cancer Father     lung ca  . Hyperlipidemia Father     PAD  . Diabetes Sister   . Cancer Paternal Aunt     breast  . Diabetes Maternal Grandfather   . Cancer Maternal Grandfather     colon ca  . Cancer Paternal Grandmother     colon  . Cancer Paternal Grandfather     colon ca  . Heart disease Neg Hx     Social History   Social History  . Marital Status: Married    Spouse Name: N/A  . Number of Children: N/A  . Years of Education: N/A   Occupational History  . Not on file.   Social History Main Topics  . Smoking status: Current Every Day Smoker -- 1.00 packs/day for 35 years    Types: Cigarettes  . Smokeless tobacco: Never Used  .  Alcohol Use: Yes     Comment: 2 drinks per year  . Drug Use: No  . Sexual Activity: Not on file   Other Topics Concern  . Not on file   Social History Narrative    Review of Systems: See HPI, otherwise negative ROS  Physical Exam: Ht 5\' 9"  (1.753 m)  Wt 273 lb (123.832 kg)  BMI 40.30 kg/m2 General:   Alert,  pleasant and cooperative in NAD Head:  Normocephalic and atraumatic. Neck:  Supple; no masses or thyromegaly. Lungs:  Clear throughout to auscultation.    Heart:  Regular rate and rhythm. Abdomen:  Soft, nontender and nondistended. Normal bowel sounds, without guarding, and without rebound.   Neurologic:  Alert and  oriented x4;  grossly normal neurologically.  Impression/Plan: Lisa Valencia is now here to undergo a screening colonoscopy.  Risks, benefits, and alternatives regarding colonoscopy have been reviewed with the patient.  Questions have been answered.  All parties agreeable.

## 2015-06-05 NOTE — Anesthesia Preprocedure Evaluation (Signed)
Anesthesia Evaluation    Airway Mallampati: II  TM Distance: >3 FB Neck ROM: Full    Dental no notable dental hx.    Pulmonary Current Smoker,    Pulmonary exam normal breath sounds clear to auscultation       Cardiovascular Normal cardiovascular exam Rhythm:Regular Rate:Normal     Neuro/Psych sciatica    GI/Hepatic GERD  Medicated and Controlled,Hepatic steatosis   Endo/Other  Morbid obesity  Renal/GU      Musculoskeletal   Abdominal   Peds  Hematology   Anesthesia Other Findings   Reproductive/Obstetrics                             Anesthesia Physical Anesthesia Plan  ASA: II  Anesthesia Plan: MAC   Post-op Pain Management:    Induction: Intravenous  Airway Management Planned:   Additional Equipment:   Intra-op Plan:   Post-operative Plan: Extubation in OR  Informed Consent: I have reviewed the patients History and Physical, chart, labs and discussed the procedure including the risks, benefits and alternatives for the proposed anesthesia with the patient or authorized representative who has indicated his/her understanding and acceptance.   Dental advisory given  Plan Discussed with: CRNA  Anesthesia Plan Comments:         Anesthesia Quick Evaluation

## 2015-06-08 ENCOUNTER — Encounter: Payer: Self-pay | Admitting: Gastroenterology

## 2015-06-10 ENCOUNTER — Encounter: Payer: Self-pay | Admitting: Gastroenterology

## 2015-06-14 LAB — HM COLONOSCOPY

## 2015-06-24 ENCOUNTER — Other Ambulatory Visit: Payer: Self-pay

## 2015-06-24 ENCOUNTER — Encounter: Payer: Self-pay | Admitting: Internal Medicine

## 2015-06-24 ENCOUNTER — Other Ambulatory Visit: Payer: Self-pay | Admitting: Internal Medicine

## 2015-06-24 MED ORDER — CIPROFLOXACIN HCL 250 MG PO TABS: 250.0000 mg | ORAL_TABLET | Freq: Two times a day (BID) | ORAL | Status: DC

## 2015-06-25 ENCOUNTER — Other Ambulatory Visit: Payer: Self-pay | Admitting: Internal Medicine

## 2015-07-14 ENCOUNTER — Telehealth: Payer: Self-pay | Admitting: Internal Medicine

## 2015-07-14 NOTE — Telephone Encounter (Signed)
Colonoscopy report

## 2015-07-23 ENCOUNTER — Other Ambulatory Visit: Payer: Self-pay | Admitting: Internal Medicine

## 2015-08-14 ENCOUNTER — Ambulatory Visit (INDEPENDENT_AMBULATORY_CARE_PROVIDER_SITE_OTHER): Payer: 59 | Admitting: Internal Medicine

## 2015-08-14 ENCOUNTER — Ambulatory Visit: Payer: Self-pay | Admitting: Internal Medicine

## 2015-08-14 ENCOUNTER — Encounter: Payer: Self-pay | Admitting: Internal Medicine

## 2015-08-14 VITALS — BP 120/70 | HR 77 | Temp 97.9°F | Resp 12 | Ht 69.0 in | Wt 272.5 lb

## 2015-08-14 DIAGNOSIS — E669 Obesity, unspecified: Secondary | ICD-10-CM

## 2015-08-14 DIAGNOSIS — Z716 Tobacco abuse counseling: Secondary | ICD-10-CM | POA: Diagnosis not present

## 2015-08-14 DIAGNOSIS — Z1239 Encounter for other screening for malignant neoplasm of breast: Secondary | ICD-10-CM

## 2015-08-14 MED ORDER — PHENTERMINE HCL 37.5 MG PO TABS: ORAL_TABLET | ORAL | Status: DC

## 2015-08-14 NOTE — Progress Notes (Signed)
Subjective:  Patient ID: Lisa Valencia, female    DOB: 12-Dec-1960  Age: 55 y.o. MRN: [id number]  CC: The primary encounter diagnosis was Screening for breast cancer. Diagnoses of Obesity (BMI 30-39.9) and Tobacco abuse counseling were also pertinent to this visit.  HPI Lisa Valencia presents for folllow up on pharmacotherapy for obesity started 3 moths ago.   Has lost 7 lbs since October  3 month goal was 14 lbs. Patient states that "our scales are inaccurate" because her home scales indicate q weight loss of 10 lbs , with a starting point of  279 lbs. Diet and exercise regimen reviewed in detail.  She has not been adherent to a low carb or low fat diet and has not started a daily exercise program. over holidays She reports no side effects for the medication and her blood pressure has been evaluated and found to be < 130/80 since starting the medication.   She is still smoking daily. Spent 3 minutes discussing risk of continued tobacco abuse, including but not limited to CAD, PAD, hypertension, and CA.  She is not interested in pharmacotherapy at this time.  Outpatient Prescriptions Prior to Visit  Medication Sig Dispense Refill  . ALPRAZolam (XANAX) 0.5 MG tablet Take 1 tablet (0.5 mg total) by mouth 3 (three) times daily as needed for anxiety. 90 tablet 0  . aspirin 81 MG tablet Take 81 mg by mouth daily.    . celecoxib (CELEBREX) 200 MG capsule TAKE 1 CAPSULE TWICE DAILY 180 capsule 1  . cetirizine (ZYRTEC) 10 MG tablet Take 10 mg by mouth daily.    . citalopram (CELEXA) 40 MG tablet TAKE ONE TABLET BY MOUTH EVERY DAY 90 tablet 2  . conjugated estrogens (PREMARIN) vaginal cream Place 1 Applicatorful vaginally daily. 42.5 g 12  . methocarbamol (ROBAXIN-750) 750 MG tablet Take 1 tablet (750 mg total) by mouth 4 (four) times daily. As needed for muscle spasm 90 tablet 2  . Multiple Vitamin (MULTIVITAMIN) tablet Take 1 tablet by mouth daily.    . Omega-3 Fatty Acids (FISH OIL)  1000 MG CAPS Take 1 capsule by mouth daily.    . pantoprazole (PROTONIX) 40 MG tablet TAKE ONE TABLET BY MOUTH 2 TIMES A DAY 180 tablet 2  . spironolactone (ALDACTONE) 50 MG tablet TAKE ONE TABLET BY MOUTH EVERY DAY AS NEEDED 90 tablet 2  . traMADol (ULTRAM) 50 MG tablet TAKE ONE TO TWO TABLETS BY MOUTH EVERY 6 HOURS AS NEEDED FOR SEVERE BACK PAIN (MAX 6 TABS/DAILY) 180 tablet 5  . varenicline (CHANTIX CONTINUING MONTH PAK) 1 MG tablet Take 1 tablet (1 mg total) by mouth 2 (two) times daily. 60 tablet 3  . varenicline (CHANTIX STARTING MONTH PAK) 0.5 MG X 11 & 1 MG X 42 tablet Take one 0.5 mg tablet by mouth once daily for 3 days, then increase to one 0.5 mg tablet twice daily for 4 days, then increase to one 1 mg tablet twice daily. 53 tablet 0  . phentermine (ADIPEX-P) 37.5 MG tablet 1/2 tablet in the am and afternoon 30 tablet 2  . ciprofloxacin (CIPRO) 250 MG tablet Take 1 tablet (250 mg total) by mouth 2 (two) times daily. 10 tablet 0  . Sod Picosulfate-Mag Ox-Cit Acd 10-3.5-12 MG-GM-GM PACK Take 1 Container by mouth as directed. 1 each 0   No facility-administered medications prior to visit.    Review of Systems;  Patient denies headache, fevers, malaise, unintentional weight loss, skin rash, eye pain,  sinus congestion and sinus pain, sore throat, dysphagia,  hemoptysis , cough, dyspnea, wheezing, chest pain, palpitations, orthopnea, edema, abdominal pain, nausea, melena, diarrhea, constipation, flank pain, dysuria, hematuria, urinary  Frequency, nocturia, numbness, tingling, seizures,  Focal weakness, Loss of consciousness,  Tremor, insomnia, depression, anxiety, and suicidal ideation.      Objective:  BP 120/70 mmHg  Pulse 77  Temp(Src) 97.9 F (36.6 C) (Oral)  Resp 12  Ht 5\' 9"  (1.753 m)  Wt 272 lb 8 oz (123.605 kg)  BMI 40.22 kg/m2  SpO2 96%  BP Readings from Last 3 Encounters:  08/14/15 120/70  06/05/15 110/65  05/06/15 108/76    Wt Readings from Last 3 Encounters:    08/14/15 272 lb 8 oz (123.605 kg)  06/05/15 273 lb (123.832 kg)  05/06/15 279 lb 2 oz (126.61 kg)    General appearance: alert, cooperative and appears stated age Ears: normal TM's and external ear canals both ears Throat: lips, mucosa, and tongue normal; teeth and gums normal Neck: no adenopathy, no carotid bruit, supple, symmetrical, trachea midline and thyroid not enlarged, symmetric, no tenderness/mass/nodules Back: symmetric, no curvature. ROM normal. No CVA tenderness. Lungs: clear to auscultation bilaterally Heart: regular rate and rhythm, S1, S2 normal, no murmur, click, rub or gallop Abdomen: soft, non-tender; bowel sounds normal; no masses,  no organomegaly Pulses: 2+ and symmetric Skin: Skin color, texture, turgor normal. No rashes or lesions Lymph nodes: Cervical, supraclavicular, and axillary nodes normal.  Lab Results  Component Value Date   HGBA1C 5.9 05/06/2015   HGBA1C 5.8 01/26/2015   HGBA1C 6.0 03/07/2014    Lab Results  Component Value Date   CREATININE 0.73 05/06/2015   CREATININE 0.73 01/26/2015   CREATININE 0.8 03/07/2014    Lab Results  Component Value Date   WBC 7.4 01/26/2015   HGB 14.7 01/26/2015   HCT 43.7 01/26/2015   PLT 211.0 01/26/2015   GLUCOSE 107* 05/06/2015   CHOL 240* 05/06/2015   TRIG 270.0* 05/06/2015   HDL 47.20 05/06/2015   LDLDIRECT 148.0 05/06/2015   LDLCALC 160* 05/17/2012   ALT 61* 05/06/2015   AST 30 05/06/2015   NA 141 05/06/2015   K 4.4 05/06/2015   CL 101 05/06/2015   CREATININE 0.73 05/06/2015   BUN 11 05/06/2015   CO2 29 05/06/2015   TSH 0.50 01/26/2015   HGBA1C 5.9 05/06/2015   MICROALBUR 1.2 03/07/2014    No results found.  Assessment & Plan:   Problem List Items Addressed This Visit    Obesity (BMI 30-39.9)    Patient advised to be more adherent to diet and increase activity to 150 minutes weekly. I have authorized the use of phentermine for 3 more months.  If she has not lost 14 lbs by  the end of  the  3 months we will have to discontinue it.       Relevant Medications   phentermine (ADIPEX-P) 37.5 MG tablet   Tobacco abuse counseling    Smoking cessation instruction/counseling given:  counseled patient on the dangers of tobacco use, advised patient to stop smoking, and reviewed strategies to maximize success       Other Visit Diagnoses    Screening for breast cancer    -  Primary    Relevant Orders    MM DIGITAL SCREENING BILATERAL      A total of 25 minutes of face to face time was spent with patient more than half of which was spent in counselling about the above  mentioned conditions  and coordination of care  I have discontinued Ms. Rosevear's Sod Picosulfate-Mag Ox-Cit Acd and ciprofloxacin. I am also having her maintain her multivitamin, aspirin, cetirizine, ALPRAZolam, Fish Oil, conjugated estrogens, methocarbamol, traMADol, pantoprazole, varenicline, varenicline, spironolactone, citalopram, celecoxib, and phentermine.  Meds ordered this encounter  Medications  . phentermine (ADIPEX-P) 37.5 MG tablet    Sig: 1/2 tablet in the am and afternoon    Dispense:  30 tablet    Refill:  2    Medications Discontinued During This Encounter  Medication Reason  . ciprofloxacin (CIPRO) 250 MG tablet Completed Course  . Sod Picosulfate-Mag Ox-Cit Acd 10-3.5-12 MG-GM-GM PACK Error  . phentermine (ADIPEX-P) 37.5 MG tablet Reorder    Follow-up: No Follow-up on file.   Crecencio Mc, MD

## 2015-08-14 NOTE — Patient Instructions (Addendum)
I have refilled phentermine for 3 more months.     Your goal is 14 lbs by the next time you return. (End of April)   Gaol is 30 minutes cardio 5 days per week .   NO CANDY!!!!   Try the [employer] Triple Zero salted caramel flavor is excellent!

## 2015-08-14 NOTE — Progress Notes (Signed)
Pre-visit discussion using our clinic review tool. No additional management support is needed unless otherwise documented below in the visit note.  

## 2015-08-16 ENCOUNTER — Encounter: Payer: Self-pay | Admitting: Internal Medicine

## 2015-08-16 NOTE — Assessment & Plan Note (Signed)
Patient advised to be more adherent to diet and increase activity to 150 minutes weekly. I have authorized the use of phentermine for 3 more months.  If she has not lost 14 lbs by  the end of the  3 months we will have to discontinue it.

## 2015-08-16 NOTE — Assessment & Plan Note (Signed)
Smoking cessation instruction/counseling given:  counseled patient on the dangers of tobacco use, advised patient to stop smoking, and reviewed strategies to maximize success 

## 2015-10-12 ENCOUNTER — Other Ambulatory Visit: Payer: Self-pay | Admitting: Internal Medicine

## 2015-10-12 NOTE — Telephone Encounter (Signed)
Patient requesting refill on phentermine last OV 1/17?

## 2015-10-13 NOTE — Telephone Encounter (Signed)
Refill for 30 days only.  OFFICE VISIT NEEDED prior to any more refills.  Remind patient that goal for future refills is 14 lbs (258 lbs by our scales )

## 2015-10-15 ENCOUNTER — Encounter: Payer: Self-pay | Admitting: Internal Medicine

## 2015-10-15 ENCOUNTER — Ambulatory Visit (INDEPENDENT_AMBULATORY_CARE_PROVIDER_SITE_OTHER): Payer: Managed Care, Other (non HMO) | Admitting: Internal Medicine

## 2015-10-15 VITALS — BP 116/72 | HR 79 | Temp 98.0°F | Wt 277.5 lb

## 2015-10-15 DIAGNOSIS — J069 Acute upper respiratory infection, unspecified: Secondary | ICD-10-CM | POA: Diagnosis not present

## 2015-10-15 DIAGNOSIS — R059 Cough, unspecified: Secondary | ICD-10-CM

## 2015-10-15 DIAGNOSIS — R05 Cough: Secondary | ICD-10-CM | POA: Diagnosis not present

## 2015-10-15 LAB — POCT INFLUENZA A/B
Influenza A, POC: NEGATIVE
Influenza B, POC: NEGATIVE

## 2015-10-15 MED ORDER — OSELTAMIVIR PHOSPHATE 75 MG PO CAPS: 75.0000 mg | ORAL_CAPSULE | Freq: Two times a day (BID) | ORAL | Status: DC

## 2015-10-15 NOTE — Addendum Note (Signed)
Addended by: Ronette Deter A on: 10/15/2015 10:31 AM   Modules accepted: Level of Service

## 2015-10-15 NOTE — Patient Instructions (Signed)
Continue supportive care for now.  If you develop fever or chills, start Tamiflu.

## 2015-10-15 NOTE — Progress Notes (Signed)
Subjective:    Patient ID: Lisa Valencia, female    DOB: April 18, 1961, 55 y.o.   MRN: [id number]  HPI  55YO female presents for acute visit.  Body aches, sore throat, cough, headache - Started yesterday. Husband had Flu last week. Had some headache yesterday, which is improved today. Sore throat. No fever.  Cough is non-productive.   Wt Readings from Last 3 Encounters:  10/15/15 277 lb 8 oz (125.873 kg)  08/14/15 272 lb 8 oz (123.605 kg)  06/05/15 273 lb (123.832 kg)   BP Readings from Last 3 Encounters:  10/15/15 116/72  08/14/15 120/70  06/05/15 110/65    Past Medical History  Diagnosis Date  . Esophagitis, reflux 2010     EGD by [facility], managed wiht dexilant  . Diverticulosis of intestine 2010    [facility]  . GERD (gastroesophageal reflux disease)    Family History  Problem Relation Age of Onset  . Cancer Father     lung ca  . Hyperlipidemia Father     PAD  . Diabetes Sister   . Cancer Paternal Aunt     breast  . Diabetes Maternal Grandfather   . Cancer Maternal Grandfather     colon ca  . Cancer Paternal Grandmother     colon  . Cancer Paternal Grandfather     colon ca  . Heart disease Neg Hx    Past Surgical History  Procedure Laterality Date  . Joint replacement  2012    hip, [name]  . Appendectomy    . Abdominal hysterectomy  2009    supracervical   . Colonoscopy with propofol N/A 06/05/2015    Procedure: COLONOSCOPY WITH PROPOFOL;  Surgeon: Lucilla Lame, MD;  Location: [facility];  Service: Endoscopy;  Laterality: N/A;  Pt requests early. Pt for antibiotic pre-procedure  CECAL PROTRUSION BX. / WITH CLIP LOT # [id number] RESOLUTION 360 EXP 03/03/2016 SIGMOID POLYP   Social History   Social History  . Marital Status: Married    Spouse Name: N/A  . Number of Children: N/A  . Years of Education: N/A   Social History Main Topics  . Smoking status: Current Every Day Smoker -- 1.00 packs/day for 35 years    Types: Cigarettes    . Smokeless tobacco: Never Used  . Alcohol Use: Yes     Comment: 2 drinks per year  . Drug Use: No  . Sexual Activity: Not Asked   Other Topics Concern  . None   Social History Narrative    Review of Systems  Constitutional: Positive for fatigue. Negative for fever, chills, appetite change and unexpected weight change.  HENT: Positive for congestion, ear pain (fluttering at times), postnasal drip, rhinorrhea and sore throat. Negative for ear discharge, facial swelling, hearing loss, mouth sores, nosebleeds, sinus pressure, sneezing, tinnitus, trouble swallowing and voice change.   Eyes: Negative for pain, discharge, redness and visual disturbance.  Respiratory: Negative for cough, chest tightness, shortness of breath, wheezing and stridor.   Cardiovascular: Negative for chest pain, palpitations and leg swelling.  Gastrointestinal: Negative for nausea, vomiting, abdominal pain, diarrhea and constipation.  Musculoskeletal: Positive for myalgias. Negative for arthralgias, neck pain and neck stiffness.  Skin: Negative for color change and rash.  Neurological: Positive for headaches. Negative for dizziness, weakness and light-headedness.  Hematological: Negative for adenopathy. Does not bruise/bleed easily.  Psychiatric/Behavioral: Negative for dysphoric mood. The patient is not nervous/anxious.        Objective:    BP 116/72 mmHg  Pulse 79  Temp(Src) 98 F (36.7 C) (Oral)  Wt 277 lb 8 oz (125.873 kg)  SpO2 97% Physical Exam  Constitutional: She is oriented to person, place, and time. She appears well-developed and well-nourished. No distress.  HENT:  Head: Normocephalic and atraumatic.  Right Ear: Tympanic membrane, external ear and ear canal normal.  Left Ear: Tympanic membrane, external ear and ear canal normal.  Nose: Nose normal.  Mouth/Throat: Oropharynx is clear and moist. No oropharyngeal exudate.  Eyes: Conjunctivae are normal. Pupils are equal, round, and reactive to  light. Right eye exhibits no discharge. Left eye exhibits no discharge. No scleral icterus.  Neck: Normal range of motion. Neck supple. No tracheal deviation present. No thyromegaly present.  Cardiovascular: Normal rate, regular rhythm, normal heart sounds and intact distal pulses.  Exam reveals no gallop and no friction rub.   No murmur heard. Pulmonary/Chest: Effort normal and breath sounds normal. No accessory muscle usage. No respiratory distress. She has no decreased breath sounds. She has no wheezes. She has no rhonchi. She has no rales. She exhibits no tenderness.  Musculoskeletal: Normal range of motion. She exhibits no edema or tenderness.  Lymphadenopathy:    She has no cervical adenopathy.  Neurological: She is alert and oriented to person, place, and time. No cranial nerve deficit. She exhibits normal muscle tone. Coordination normal.  Skin: Skin is warm and dry. No rash noted. She is not diaphoretic. No erythema. No pallor.  Psychiatric: She has a normal mood and affect. Her behavior is normal. Judgment and thought content normal.          Assessment & Plan:   Problem List Items Addressed This Visit      Unprioritized   Viral upper respiratory illness - Primary    Symptoms most consistent with early viral URI. She is afebrile today and rapid flu was negative. She has had multiple flu exposures through work and home (husband has flu). If she develops fever or chills she will start Tamiflu and email with update. Otherwise, continue supportive care, rest. She is staying home from work today. Return precautions given.      Relevant Medications   oseltamivir (TAMIFLU) 75 MG capsule    Other Visit Diagnoses    Cough        Relevant Orders    POCT Influenza A/B (Completed)        Return if symptoms worsen or fail to improve.  Ronette Deter, MD [facility]

## 2015-10-15 NOTE — Assessment & Plan Note (Signed)
Symptoms most consistent with early viral URI. She is afebrile today and rapid flu was negative. She has had multiple flu exposures through work and home (husband has flu). If she develops fever or chills she will start Tamiflu and email with update. Otherwise, continue supportive care, rest. She is staying home from work today. Return precautions given.

## 2015-10-15 NOTE — Progress Notes (Signed)
Pre visit review using our clinic review tool, if applicable. No additional management support is needed unless otherwise documented below in the visit note. 

## 2015-11-10 ENCOUNTER — Encounter: Payer: Self-pay | Admitting: Internal Medicine

## 2015-11-13 ENCOUNTER — Ambulatory Visit: Payer: 59 | Admitting: Internal Medicine

## 2015-11-17 ENCOUNTER — Ambulatory Visit: Payer: Self-pay | Admitting: Internal Medicine

## 2015-12-10 ENCOUNTER — Other Ambulatory Visit: Payer: Self-pay | Admitting: Internal Medicine

## 2015-12-11 NOTE — Telephone Encounter (Signed)
REFILLED

## 2015-12-11 NOTE — Telephone Encounter (Signed)
Okay to refill traMADol (ULTRAM) 50 MG tablet Last filled 04/10/15 Last office visit 11/13/15 Next office visit 02/01/16

## 2015-12-11 NOTE — Telephone Encounter (Signed)
Rx for Tramadol faxed.

## 2016-02-01 ENCOUNTER — Ambulatory Visit (INDEPENDENT_AMBULATORY_CARE_PROVIDER_SITE_OTHER): Payer: Managed Care, Other (non HMO) | Admitting: Internal Medicine

## 2016-02-01 ENCOUNTER — Other Ambulatory Visit (HOSPITAL_COMMUNITY)
Admission: RE | Admit: 2016-02-01 | Discharge: 2016-02-01 | Disposition: A | Discharge status: Home / Self Care | Payer: Managed Care, Other (non HMO) | Source: Ambulatory Visit | Attending: Internal Medicine | Admitting: Internal Medicine

## 2016-02-01 ENCOUNTER — Encounter: Payer: Self-pay | Admitting: Internal Medicine

## 2016-02-01 VITALS — BP 118/74 | HR 85 | Temp 98.6°F | Resp 12 | Ht 69.5 in | Wt 277.0 lb

## 2016-02-01 DIAGNOSIS — Z1239 Encounter for other screening for malignant neoplasm of breast: Secondary | ICD-10-CM

## 2016-02-01 DIAGNOSIS — K760 Fatty (change of) liver, not elsewhere classified: Secondary | ICD-10-CM | POA: Diagnosis not present

## 2016-02-01 DIAGNOSIS — R635 Abnormal weight gain: Secondary | ICD-10-CM

## 2016-02-01 DIAGNOSIS — Z90710 Acquired absence of both cervix and uterus: Secondary | ICD-10-CM | POA: Diagnosis not present

## 2016-02-01 DIAGNOSIS — Z1151 Encounter for screening for human papillomavirus (HPV): Secondary | ICD-10-CM | POA: Insufficient documentation

## 2016-02-01 DIAGNOSIS — R7301 Impaired fasting glucose: Secondary | ICD-10-CM

## 2016-02-01 DIAGNOSIS — Z0000 Encounter for general adult medical examination without abnormal findings: Secondary | ICD-10-CM | POA: Diagnosis not present

## 2016-02-01 DIAGNOSIS — R7303 Prediabetes: Secondary | ICD-10-CM

## 2016-02-01 DIAGNOSIS — L723 Sebaceous cyst: Secondary | ICD-10-CM

## 2016-02-01 DIAGNOSIS — R0683 Snoring: Secondary | ICD-10-CM

## 2016-02-01 DIAGNOSIS — R5383 Other fatigue: Secondary | ICD-10-CM | POA: Diagnosis not present

## 2016-02-01 DIAGNOSIS — Z716 Tobacco abuse counseling: Secondary | ICD-10-CM

## 2016-02-01 DIAGNOSIS — Z01419 Encounter for gynecological examination (general) (routine) without abnormal findings: Secondary | ICD-10-CM | POA: Insufficient documentation

## 2016-02-01 DIAGNOSIS — E785 Hyperlipidemia, unspecified: Secondary | ICD-10-CM

## 2016-02-01 DIAGNOSIS — E669 Obesity, unspecified: Secondary | ICD-10-CM

## 2016-02-01 DIAGNOSIS — Z124 Encounter for screening for malignant neoplasm of cervix: Secondary | ICD-10-CM | POA: Diagnosis not present

## 2016-02-01 DIAGNOSIS — L089 Local infection of the skin and subcutaneous tissue, unspecified: Secondary | ICD-10-CM

## 2016-02-01 LAB — COMPREHENSIVE METABOLIC PANEL
ALT: 66 U/L — ABNORMAL HIGH (ref 0–35)
AST: 31 U/L (ref 0–37)
Albumin: 4.5 g/dL (ref 3.5–5.2)
Alkaline Phosphatase: 41 U/L (ref 39–117)
BUN: 18 mg/dL (ref 6–23)
CO2: 26 mEq/L (ref 19–32)
Calcium: 9.8 mg/dL (ref 8.4–10.5)
Chloride: 102 mEq/L (ref 96–112)
Creatinine, Ser: 0.88 mg/dL (ref 0.40–1.20)
GFR: 70.89 mL/min (ref >60.00)
Glucose, Bld: 95 mg/dL (ref 70–99)
Potassium: 4.1 mEq/L (ref 3.5–5.1)
Sodium: 136 mEq/L (ref 135–145)
Total Bilirubin: 0.4 mg/dL (ref 0.2–1.2)
Total Protein: 7.4 g/dL (ref 6.0–8.3)

## 2016-02-01 LAB — LIPID PANEL
Cholesterol: 220 mg/dL — ABNORMAL HIGH (ref 0–200)
HDL: 39.9 mg/dL (ref >39.00)
NonHDL: 179.72
Total CHOL/HDL Ratio: 6
Triglycerides: 335 mg/dL — ABNORMAL HIGH (ref 0.0–149.0)
VLDL: 67 mg/dL — ABNORMAL HIGH (ref 0.0–40.0)

## 2016-02-01 LAB — LDL CHOLESTEROL, DIRECT: Direct LDL: 144 mg/dL

## 2016-02-01 LAB — HEMOGLOBIN A1C: Hgb A1c MFr Bld: 5.8 % (ref 4.6–6.5)

## 2016-02-01 LAB — TSH: TSH: 0.76 u[IU]/mL (ref 0.35–4.50)

## 2016-02-01 MED ORDER — DOXYCYCLINE HYCLATE 100 MG PO CAPS: 100.0000 mg | ORAL_CAPSULE | Freq: Two times a day (BID) | ORAL | Status: DC

## 2016-02-01 NOTE — Progress Notes (Addendum)
Patient ID: Yenna Rozeboom, female    DOB: 1960-09-20  Age: 55 y.o. MRN: [id number]  The patient is here for annual Medicare wellness examination and management of other chronic and acute problems.   Due for PAP , mammogram  The risk factors are reflected in the social history.  The roster of all physicians providing medical care to patient - is listed in the Snapshot section of the chart.  Home safety : The patient has smoke detectors in the home. They wear seatbelts.  There are no firearms at home. There is no violence in the home.   There is no risks for hepatitis, STDs or HIV. There is no   history of blood transfusion. They have no travel history to infectious disease endemic areas of the world.  The patient has seen their dentist in the last six month. They do not  have excessive sun exposure. Discussed the need for sun protection: hats, long sleeves and use of sunscreen if there is significant sun exposure.   Diet: the importance of a healthy diet is discussed. They do not  have a healthy diet. BMI 40.  Has tried Weight Watchers , not open at a time she can go bc of work hours. The benefits of regular aerobic exercise were discussed. She does not exercise regularly .   Depression screen: there are no signs or vegative symptoms of depression- irritability, change in appetite, anhedonia, sadness/tearfullness.  The following portions of the patient's history were reviewed and updated as appropriate: allergies, current medications, past family history, past medical history,  past surgical history, past social history  and problem list.  Visual acuity was not assessed per patient preference since she has regular follow up with her ophthalmologist. Hearing and body mass index were assessed and reviewed.   During the course of the visit the patient was educated and counseled about appropriate screening and preventive services including : fall prevention , diabetes screening, nutrition  counseling, colorectal cancer screening, and recommended immunizations.    CC: The primary encounter diagnosis was Encounter for preventive health examination. Diagnoses of Infected sebaceous cyst of skin, Prediabetes, Weight gain, Other fatigue, Cervical cancer screening, Hepatic steatosis, S/P hysterectomy, Obesity (BMI 30-39.9), Snoring, Tobacco abuse counseling, Hyperlipidemia, Impaired fasting glucose, and Breast cancer screening were also pertinent to this visit.   Still smoking .  Working 2nd shift 3:30 to [place] 4 days per week,  Not sleeping well because of interruptions. Not conentrating well for the last month affecting his work. 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  He is not interested in pharmacotherapy at this time.  Back hurt for a month  Overuse Injury occurred from working on building a patio.  Was using MR and tramadol 3 times daily along with celebrex which is chronic .    LEFT LEG SWELLING, chronic prior injury to left leg remotely. Not wearing stockign s  REcurrent episodes of "boil" on shoulder.  Gets enflamed,  Drains '" cheesy  Foul smelling smelling material,  Resolves.   Tired a lot .  Doesn't rest well..  Snores , no energy to exercise     Prediabetic by Oct labs  Lab Results  Component Value Date   HGBA1C 5.9 05/06/2015     History Tamber has a past medical history of Esophagitis, reflux (2010 ); Diverticulosis of intestine (2010); and GERD (gastroesophageal reflux  disease).   She has past surgical history that includes Joint replacement (2012); Appendectomy; Abdominal hysterectomy (2009); and Colonoscopy with propofol (N/A, 06/05/2015).   Her family history includes Cancer in her father, maternal grandfather, paternal aunt, paternal grandfather, and paternal grandmother; Diabetes in her maternal grandfather and sister;  Hyperlipidemia in her father. There is no history of Heart disease.She reports that she has been smoking Cigarettes.  She has a 35 pack-year smoking history. She has never used smokeless tobacco. She reports that she drinks alcohol. She reports that she does not use illicit drugs.  Outpatient Prescriptions Prior to Visit  Medication Sig Dispense Refill  . ALPRAZolam (XANAX) 0.5 MG tablet Take 1 tablet (0.5 mg total) by mouth 3 (three) times daily as needed for anxiety. 90 tablet 0  . aspirin 81 MG tablet Take 81 mg by mouth daily.    . celecoxib (CELEBREX) 200 MG capsule TAKE 1 CAPSULE TWICE DAILY 180 capsule 1  . celecoxib (CELEBREX) 200 MG capsule TAKE 1 CAPSULE TWICE DAILY 180 capsule 3  . cetirizine (ZYRTEC) 10 MG tablet Take 10 mg by mouth daily.    . citalopram (CELEXA) 40 MG tablet TAKE ONE TABLET BY MOUTH EVERY DAY 90 tablet 2  . conjugated estrogens (PREMARIN) vaginal cream Place 1 Applicatorful vaginally daily. 42.5 g 12  . methocarbamol (ROBAXIN-750) 750 MG tablet Take 1 tablet (750 mg total) by mouth 4 (four) times daily. As needed for muscle spasm 90 tablet 2  . Multiple Vitamin (MULTIVITAMIN) tablet Take 1 tablet by mouth daily.    . Omega-3 Fatty Acids (FISH OIL) 1000 MG CAPS Take 1 capsule by mouth daily.    . pantoprazole (PROTONIX) 40 MG tablet TAKE ONE TABLET BY MOUTH TWICE DAILY 180 tablet 3  . spironolactone (ALDACTONE) 50 MG tablet TAKE ONE TABLET BY MOUTH EVERY DAY AS NEEDED 90 tablet 2  . traMADol (ULTRAM) 50 MG tablet TAKE 1 TO 2 TABLETS EVERY 6 HOURS AS NEEDED FOR SEVERE BACK PAIN MAX OF 6 A DAY 180 tablet 3  . oseltamivir (TAMIFLU) 75 MG capsule Take 1 capsule (75 mg total) by mouth 2 (two) times daily. (Patient not taking: Reported on 02/01/2016) 10 capsule 0  . phentermine (ADIPEX-P) 37.5 MG tablet TAKE 1/2 TABLET EVERY MORNING AND 1/2 TABLET EVERY EVENING (Patient not taking: Reported on 02/01/2016) 30 tablet 0   No facility-administered medications prior to visit.     Review of Systems   Patient denies headache, fevers, malaise, unintentional weight loss, skin rash, eye pain, sinus congestion and sinus pain, sore throat, dysphagia,  hemoptysis , cough, dyspnea, wheezing, chest pain, palpitations, orthopnea, edema, abdominal pain, nausea, melena, diarrhea, constipation, flank pain, dysuria, hematuria, urinary  Frequency, nocturia, numbness, tingling, seizures,  Focal weakness, Loss of consciousness,  Tremor, insomnia, depression, anxiety, and suicidal ideation.      Objective:  BP 118/74 mmHg  Pulse 85  Temp(Src) 98.6 F (37 C) (Oral)  Resp 12  Ht 5' 9.5" (1.765 m)  Wt 277 lb (125.646 kg)  BMI 40.33 kg/m2  SpO2 96%  Physical Exam  General Appearance:    Alert, cooperative, no distress, appears stated age  Head:    Normocephalic, without obvious abnormality, atraumatic  Eyes:    PERRL, conjunctiva/corneas clear, EOM's intact, fundi    benign, both eyes  Ears:    Normal TM's and external ear canals, both ears  Nose:   Nares normal, septum midline, mucosa normal, no drainage    or sinus tenderness  Throat:  Lips, mucosa, and tongue normal; teeth and gums normal  Neck:   Supple, symmetrical, trachea midline, no adenopathy;    thyroid:  no enlargement/tenderness/nodules; no carotid   bruit or JVD  Back:     Symmetric, no curvature, ROM normal, no CVA tenderness  Lungs:     Clear to auscultation bilaterally, respirations unlabored  Chest Wall:    No tenderness or deformity   Heart:    Regular rate and rhythm, S1 and S2 normal, no murmur, rub   or gallop  Breast Exam:    No tenderness, masses, or nipple abnormality  Abdomen:     Soft, non-tender, bowel sounds active all four quadrants,    no masses, no organomegaly  Genitalia:    Pelvic: cervix normal in appearance, external genitalia normal, no adnexal masses or tenderness, no cervical motion tenderness, rectovaginal septum normal, uterus normal size, shape, and consistency and vagina normal  without discharge  Extremities:   Extremities normal, atraumatic, no cyanosis or edema  Pulses:   2+ and symmetric all extremities  Skin:   Skin color, texture, turgor normal, no rashes or lesions  Lymph nodes:   Cervical, supraclavicular, and axillary nodes normal  Neurologic:   CNII-XII intact, normal strength, sensation and reflexes    throughout     Assessment & Plan:   Problem List Items Addressed This Visit    Obesity (BMI 30-39.9)    I have addressed  BMI and recommended wt loss of 10% of body weight over the next 6 months using a low glycemic index diet and regular exercise a minimum of 5 days per week. OSA suspected , sleep study ordered        Relevant Orders   Ambulatory referral to Sleep Studies   Impaired fasting glucose    Encouraged weight, regular participation in aerobic exercise and adherence to a low glycemic index diet.  A1c is still < 6.0   Lab Results  Component Value Date   HGBA1C 5.8 02/01/2016   Lab Results  Component Value Date   MICROALBUR 1.2 03/07/2014                 Tobacco abuse counseling    Spent 3 minutes discussing risk of continued tobacco abuse, including but not limited to CAD, PAD, hypertension, and CA.  He is not interested in pharmacotherapy at this time.      Hyperlipidemia    Loss of control, triglycerides are now over  300,  But elevated LDL.  And LDL 144.  Advising statin therapy.  Lab Results  Component Value Date   CHOL 220* 02/01/2016   HDL 39.90 02/01/2016   LDLCALC 160* 05/17/2012   LDLDIRECT 144.0 02/01/2016   TRIG 335.0* 02/01/2016   CHOLHDL 6 02/01/2016           Relevant Orders   LDL cholesterol, direct (Completed)   S/P hysterectomy    Supracervical hysterectomy PAP sear done today       Encounter for preventive health examination - Primary    Annual comprehensive preventive exam was done as well as an evaluation and management of chronic conditions .  During the course of the visit the  patient was educated and counseled about appropriate screening and preventive services including :  diabetes screening, lipid analysis with projected  10 year  risk for CAD , nutrition counseling, breast, cervical and colorectal cancer screening, and recommended immunizations.  Printed recommendations for health maintenance screenings was given.  Lab Results  Component Value  Date   HGBA1C 5.8 02/01/2016   Lab Results  Component Value Date   CHOL 220* 02/01/2016   HDL 39.90 02/01/2016   LDLCALC 160* 05/17/2012   LDLDIRECT 144.0 02/01/2016   TRIG 335.0* 02/01/2016   CHOLHDL 6 02/01/2016         Hepatic steatosis    Weight loss, low GI diet and exercise recommended.       Infected sebaceous cyst of skin    Advised to use doxycycline  For next episode of enflamed epidermal cyst.       Snoring    With daytime fatigue and obesity ,   Sleep apnea suspected,  Ordering sleep study       Relevant Orders   Ambulatory referral to Sleep Studies    Other Visit Diagnoses    Prediabetes        Relevant Orders    Comprehensive metabolic panel (Completed)    Lipid panel (Completed)    Hemoglobin A1c (Completed)    Weight gain        Relevant Orders    TSH (Completed)    Other fatigue        Relevant Orders    CBC with Differential/Platelet (Completed)    Ambulatory referral to Sleep Studies    Cervical cancer screening        Relevant Orders    Cytology - PAP (Completed)    Breast cancer screening        Relevant Orders    MM DIGITAL SCREENING BILATERAL       I have discontinued Ms. Holzhauer's phentermine and oseltamivir. I am also having her start on doxycycline. Additionally, I am having her maintain her multivitamin, aspirin, cetirizine, ALPRAZolam, Fish Oil, conjugated estrogens, methocarbamol, spironolactone, citalopram, celecoxib, traMADol, celecoxib, and pantoprazole.  Meds ordered this encounter  Medications  . doxycycline (VIBRAMYCIN) 100 MG capsule    Sig: Take 1  capsule (100 mg total) by mouth 2 (two) times daily.    Dispense:  14 capsule    Refill:  0    Medications Discontinued During This Encounter  Medication Reason  . oseltamivir (TAMIFLU) 75 MG capsule   . phentermine (ADIPEX-P) 37.5 MG tablet     Follow-up: Return in about 3 months (around 05/03/2016).   Crecencio Mc, MD

## 2016-02-01 NOTE — Progress Notes (Signed)
Pre-visit discussion using our clinic review tool. No additional management support is needed unless otherwise documented below in the visit note.  

## 2016-02-01 NOTE — Patient Instructions (Signed)
We addressed multiple issues today along with your annual   exam  You have venous insufficiency  Involving your left leg  That is causing swelling,. try using compression knee highs  You need to lose 27 lbs this year  You are at risk  For Type 2 diabetes  You need to have a sleep study   You need to quit smoking   I will refill the premair  Save the doxycycline for your next episode of  enflamed epidermal cyst on your shoulder.  When you start the antibiotic, please take a probiotic for a minimum of 3 weeks to prevent a serious antibiotic associated diarrhea  Called clostridium dificile colitis.  Taking a probiotic may also prevent vaginitis due to yeast infections and can be continued indefinitely if you feel that it improves your digestion or your elimination (bowels). Menopause is a normal process in which your reproductive ability comes to an end. This process happens gradually over a span of months to years, usually between the ages of 61 and 36. Menopause is complete when you have missed 12 consecutive menstrual periods. It is important to talk with your health care provider about some of the most common conditions that affect postmenopausal women, such as heart disease, cancer, and bone loss (osteoporosis). Adopting a healthy lifestyle and getting preventive care can help to promote your health and wellness. Those actions can also lower your chances of developing some of these common conditions. WHAT SHOULD I KNOW ABOUT MENOPAUSE? During menopause, you may experience a number of symptoms, such as:  Moderate-to-severe hot flashes.  Night sweats.  Decrease in sex drive.  Mood swings.  Headaches.  Tiredness.  Irritability.  Memory problems.  Insomnia. Choosing to treat or not to treat menopausal changes is an individual decision that you make with your health care provider. WHAT SHOULD I KNOW ABOUT HORMONE REPLACEMENT THERAPY AND SUPPLEMENTS? Hormone therapy products are  effective for treating symptoms that are associated with menopause, such as hot flashes and night sweats. Hormone replacement carries certain risks, especially as you become older. If you are thinking about using estrogen or estrogen with progestin treatments, discuss the benefits and risks with your health care provider. WHAT SHOULD I KNOW ABOUT HEART DISEASE AND STROKE? Heart disease, heart attack, and stroke become more likely as you age. This may be due, in part, to the hormonal changes that your body experiences during menopause. These can affect how your body processes dietary fats, triglycerides, and cholesterol. Heart attack and stroke are both medical emergencies. There are many things that you can do to help prevent heart disease and stroke:  Have your blood pressure checked at least every 1-2 years. High blood pressure causes heart disease and increases the risk of stroke.  If you are 33-53 years old, ask your health care provider if you should take aspirin to prevent a heart attack or a stroke.  Do not use any tobacco products, including cigarettes, chewing tobacco, or electronic cigarettes. If you need help quitting, ask your health care provider.  It is important to eat a healthy diet and maintain a healthy weight.  Be sure to include plenty of vegetables, fruits, low-fat dairy products, and lean protein.  Avoid eating foods that are high in solid fats, added sugars, or salt (sodium).  Get regular exercise. This is one of the most important things that you can do for your health.  Try to exercise for at least 150 minutes each week. The type of exercise that  you do should increase your heart rate and make you sweat. This is known as moderate-intensity exercise.  Try to do strengthening exercises at least twice each week. Do these in addition to the moderate-intensity exercise.  Know your numbers.Ask your health care provider to check your cholesterol and your blood glucose.  Continue to have your blood tested as directed by your health care provider. WHAT SHOULD I KNOW ABOUT CANCER SCREENING? There are several types of cancer. Take the following steps to reduce your risk and to catch any cancer development as early as possible. Breast Cancer  Practice breast self-awareness.  This means understanding how your breasts normally appear and feel.  It also means doing regular breast self-exams. Let your health care provider know about any changes, no matter how small.  If you are 40 or older, have a clinician do a breast exam (clinical breast exam or CBE) every year. Depending on your age, family history, and medical history, it may be recommended that you also have a yearly breast X-ray (mammogram).  If you have a family history of breast cancer, talk with your health care provider about genetic screening.  If you are at high risk for breast cancer, talk with your health care provider about having an MRI and a mammogram every year.  Breast cancer (BRCA) gene test is recommended for women who have family members with BRCA-related cancers. Results of the assessment will determine the need for genetic counseling and BRCA1 and for BRCA2 testing. BRCA-related cancers include these types:  Breast. This occurs in males or females.  Ovarian.  Tubal. This may also be called fallopian tube cancer.  Cancer of the abdominal or pelvic lining (peritoneal cancer).  Prostate.  Pancreatic. Cervical, Uterine, and Ovarian Cancer Your health care provider may recommend that you be screened regularly for cancer of the pelvic organs. These include your ovaries, uterus, and vagina. This screening involves a pelvic exam, which includes checking for microscopic changes to the surface of your cervix (Pap test).  For women ages 21-65, health care providers may recommend a pelvic exam and a Pap test every three years. For women ages 64-65, they may recommend the Pap test and pelvic  exam, combined with testing for human papilloma virus (HPV), every five years. Some types of HPV increase your risk of cervical cancer. Testing for HPV may also be done on women of any age who have unclear Pap test results.  Other health care providers may not recommend any screening for nonpregnant women who are considered low risk for pelvic cancer and have no symptoms. Ask your health care provider if a screening pelvic exam is right for you.  If you have had past treatment for cervical cancer or a condition that could lead to cancer, you need Pap tests and screening for cancer for at least 20 years after your treatment. If Pap tests have been discontinued for you, your risk factors (such as having a new sexual partner) need to be reassessed to determine if you should start having screenings again. Some women have medical problems that increase the chance of getting cervical cancer. In these cases, your health care provider may recommend that you have screening and Pap tests more often.  If you have a family history of uterine cancer or ovarian cancer, talk with your health care provider about genetic screening.  If you have vaginal bleeding after reaching menopause, tell your health care provider.  There are currently no reliable tests available to screen for ovarian  cancer. Lung Cancer Lung cancer screening is recommended for adults 40-65 years old who are at high risk for lung cancer because of a history of smoking. A yearly low-dose CT scan of the lungs is recommended if you:  Currently smoke.  Have a history of at least 30 pack-years of smoking and you currently smoke or have quit within the past 15 years. A pack-year is smoking an average of one pack of cigarettes per day for one year. Yearly screening should:  Continue until it has been 15 years since you quit.  Stop if you develop a health problem that would prevent you from having lung cancer treatment. Colorectal Cancer  This  type of cancer can be detected and can often be prevented.  Routine colorectal cancer screening usually begins at age 32 and continues through age 96.  If you have risk factors for colon cancer, your health care provider may recommend that you be screened at an earlier age.  If you have a family history of colorectal cancer, talk with your health care provider about genetic screening.  Your health care provider may also recommend using home test kits to check for hidden blood in your stool.  A small camera at the end of a tube can be used to examine your colon directly (sigmoidoscopy or colonoscopy). This is done to check for the earliest forms of colorectal cancer.  Direct examination of the colon should be repeated every 5-10 years until age 62. However, if early forms of precancerous polyps or small growths are found or if you have a family history or genetic risk for colorectal cancer, you may need to be screened more often. Skin Cancer  Check your skin from head to toe regularly.  Monitor any moles. Be sure to tell your health care provider:  About any new moles or changes in moles, especially if there is a change in a mole's shape or color.  If you have a mole that is larger than the size of a pencil eraser.  If any of your family members has a history of skin cancer, especially at a young age, talk with your health care provider about genetic screening.  Always use sunscreen. Apply sunscreen liberally and repeatedly throughout the day.  Whenever you are outside, protect yourself by wearing long sleeves, pants, a wide-brimmed hat, and sunglasses. WHAT SHOULD I KNOW ABOUT OSTEOPOROSIS? Osteoporosis is a condition in which bone destruction happens more quickly than new bone creation. After menopause, you may be at an increased risk for osteoporosis. To help prevent osteoporosis or the bone fractures that can happen because of osteoporosis, the following is recommended:  If you are  9-55 years old, get at least 1,000 mg of calcium and at least 600 mg of vitamin D per day.  If you are older than age 30 but younger than age 34, get at least 1,200 mg of calcium and at least 600 mg of vitamin D per day.  If you are older than age 88, get at least 1,200 mg of calcium and at least 800 mg of vitamin D per day. Smoking and excessive alcohol intake increase the risk of osteoporosis. Eat foods that are rich in calcium and vitamin D, and do weight-bearing exercises several times each week as directed by your health care provider. WHAT SHOULD I KNOW ABOUT HOW MENOPAUSE AFFECTS MY MENTAL HEALTH? Depression may occur at any age, but it is more common as you become older. Common symptoms of depression include:  Low  or sad mood.  Changes in sleep patterns.  Changes in appetite or eating patterns.  Feeling an overall lack of motivation or enjoyment of activities that you previously enjoyed.  Frequent crying spells. Talk with your health care provider if you think that you are experiencing depression. WHAT SHOULD I KNOW ABOUT IMMUNIZATIONS? It is important that you get and maintain your immunizations. These include:  Tetanus, diphtheria, and pertussis (Tdap) booster vaccine.  Influenza every year before the flu season begins.  Pneumonia vaccine.  Shingles vaccine. Your health care provider may also recommend other immunizations.   This information is not intended to replace advice given to you by your health care provider. Make sure you discuss any questions you have with your health care provider.   Document Released: 08/26/2005 Document Revised: 07/25/2014 Document Reviewed: 03/06/2014 Elsevier Interactive Patient Education [employer].

## 2016-02-02 LAB — CBC WITH DIFFERENTIAL/PLATELET
Basophils Absolute: 0.1 10*3/uL (ref 0.0–0.1)
Basophils Relative: 1.3 % (ref 0.0–3.0)
Eosinophils Absolute: 0.2 10*3/uL (ref 0.0–0.7)
Eosinophils Relative: 3.8 % (ref 0.0–5.0)
HCT: 41.8 % (ref 36.0–46.0)
Hemoglobin: 14.3 g/dL (ref 12.0–15.0)
Lymphocytes Relative: 40.7 % (ref 12.0–46.0)
Lymphs Abs: 2.5 10*3/uL (ref 0.7–4.0)
MCHC: 34.1 g/dL (ref 30.0–36.0)
MCV: 87.1 fl (ref 78.0–100.0)
Monocytes Absolute: 0.4 10*3/uL (ref 0.1–1.0)
Monocytes Relative: 7 % (ref 3.0–12.0)
Neutro Abs: 2.8 10*3/uL (ref 1.4–7.7)
Neutrophils Relative %: 47.2 % (ref 43.0–77.0)
Platelets: 187 10*3/uL (ref 150.0–400.0)
RBC: 4.8 Mil/uL (ref 3.87–5.11)
RDW: 13.6 % (ref 11.5–15.5)
WBC: 6 10*3/uL (ref 4.0–10.5)

## 2016-02-02 LAB — CYTOLOGY - PAP

## 2016-02-03 ENCOUNTER — Encounter: Payer: Self-pay | Admitting: Internal Medicine

## 2016-02-03 DIAGNOSIS — R0683 Snoring: Secondary | ICD-10-CM | POA: Insufficient documentation

## 2016-02-03 NOTE — Assessment & Plan Note (Signed)
Loss of control, triglycerides are now over  300,  But elevated LDL.  And LDL 144.  Advising statin therapy.  Lab Results  Component Value Date   CHOL 220* 02/01/2016   HDL 39.90 02/01/2016   LDLCALC 160* 05/17/2012   LDLDIRECT 144.0 02/01/2016   TRIG 335.0* 02/01/2016   CHOLHDL 6 02/01/2016

## 2016-02-03 NOTE — Assessment & Plan Note (Signed)
Annual comprehensive preventive exam was done as well as an evaluation and management of chronic conditions .  During the course of the visit the patient was educated and counseled about appropriate screening and preventive services including :  diabetes screening, lipid analysis with projected  10 year  risk for CAD , nutrition counseling, breast, cervical and colorectal cancer screening, and recommended immunizations.  Printed recommendations for health maintenance screenings was given.  Lab Results  Component Value Date   HGBA1C 5.8 02/01/2016   Lab Results  Component Value Date   CHOL 220* 02/01/2016   HDL 39.90 02/01/2016   LDLCALC 160* 05/17/2012   LDLDIRECT 144.0 02/01/2016   TRIG 335.0* 02/01/2016   CHOLHDL 6 02/01/2016

## 2016-02-03 NOTE — Assessment & Plan Note (Signed)
Supracervical hysterectomy PAP sear done today

## 2016-02-03 NOTE — Assessment & Plan Note (Signed)
With daytime fatigue and obesity ,   Sleep apnea suspected,  Ordering sleep study

## 2016-02-03 NOTE — Assessment & Plan Note (Signed)
Spent 3 minutes discussing risk of continued tobacco abuse, including but not limited to CAD, PAD, hypertension, and CA.  He is not interested in pharmacotherapy at this time. 

## 2016-02-03 NOTE — Assessment & Plan Note (Signed)
Encouraged weight, regular participation in aerobic exercise and adherence to a low glycemic index diet.  A1c is still < 6.0   Lab Results  Component Value Date   HGBA1C 5.8 02/01/2016   Lab Results  Component Value Date   MICROALBUR 1.2 03/07/2014            

## 2016-02-03 NOTE — Assessment & Plan Note (Signed)
Weight loss, low GI diet and exercise recommended.  

## 2016-02-03 NOTE — Assessment & Plan Note (Signed)
Advised to use doxycycline  For next episode of enflamed epidermal cyst.

## 2016-02-03 NOTE — Assessment & Plan Note (Signed)
I have addressed  BMI and recommended wt loss of 10% of body weight over the next 6 months using a low glycemic index diet and regular exercise a minimum of 5 days per week. OSA suspected , sleep study ordered

## 2016-02-03 NOTE — Addendum Note (Signed)
Addended by: Crecencio Mc on: 02/03/2016 01:27 PM   Modules accepted: Orders, SmartSet

## 2016-02-12 ENCOUNTER — Encounter: Payer: 59 | Admitting: Internal Medicine

## 2016-02-22 ENCOUNTER — Telehealth: Payer: Self-pay | Admitting: Internal Medicine

## 2016-02-24 ENCOUNTER — Encounter: Payer: Self-pay | Admitting: Internal Medicine

## 2016-02-24 ENCOUNTER — Other Ambulatory Visit: Payer: Self-pay | Admitting: Internal Medicine

## 2016-02-24 MED ORDER — ESTROGENS, CONJUGATED 0.625 MG/GM VA CREA: TOPICAL_CREAM | VAGINAL | 12 refills | Status: DC

## 2016-02-24 NOTE — Telephone Encounter (Signed)
Last fill was 03/05/14 with 12 refills 42.5 g. LOV was 02/01/16. Please review-[place] Jr, RMA

## 2016-02-29 ENCOUNTER — Encounter: Payer: Self-pay | Admitting: Internal Medicine

## 2016-03-01 ENCOUNTER — Other Ambulatory Visit: Payer: Self-pay | Admitting: Internal Medicine

## 2016-03-01 MED ORDER — CIPROFLOXACIN HCL 250 MG PO TABS: 250.0000 mg | ORAL_TABLET | Freq: Two times a day (BID) | ORAL | 0 refills | Status: DC

## 2016-03-01 NOTE — Progress Notes (Signed)
ciprof

## 2016-03-01 NOTE — Progress Notes (Signed)
Noted-aa 

## 2016-03-14 ENCOUNTER — Ambulatory Visit
Admission: RE | Admit: 2016-03-14 | Discharge: 2016-03-14 | Disposition: A | Discharge status: Home / Self Care | Payer: Managed Care, Other (non HMO) | Source: Ambulatory Visit | Attending: Internal Medicine | Admitting: Internal Medicine

## 2016-03-14 DIAGNOSIS — Z1239 Encounter for other screening for malignant neoplasm of breast: Secondary | ICD-10-CM

## 2016-03-23 ENCOUNTER — Other Ambulatory Visit: Payer: Self-pay | Admitting: Internal Medicine

## 2016-03-25 ENCOUNTER — Ambulatory Visit: Admission: RE | Admit: 2016-03-25 | Payer: Managed Care, Other (non HMO) | Source: Ambulatory Visit

## 2016-04-13 ENCOUNTER — Other Ambulatory Visit: Payer: Self-pay | Admitting: Internal Medicine

## 2016-04-13 ENCOUNTER — Ambulatory Visit
Admission: RE | Admit: 2016-04-13 | Discharge: 2016-04-13 | Disposition: A | Discharge status: Home / Self Care | Payer: Managed Care, Other (non HMO) | Source: Ambulatory Visit | Attending: Internal Medicine | Admitting: Internal Medicine

## 2016-04-13 DIAGNOSIS — Z1239 Encounter for other screening for malignant neoplasm of breast: Secondary | ICD-10-CM | POA: Diagnosis not present

## 2016-04-13 DIAGNOSIS — Z1231 Encounter for screening mammogram for malignant neoplasm of breast: Secondary | ICD-10-CM | POA: Diagnosis present

## 2016-06-06 ENCOUNTER — Encounter: Payer: Self-pay | Admitting: Internal Medicine

## 2016-06-07 ENCOUNTER — Other Ambulatory Visit: Payer: Self-pay | Admitting: Internal Medicine

## 2016-06-07 MED ORDER — TRAMADOL HCL 50 MG PO TABS: ORAL_TABLET | ORAL | 2 refills | Status: DC

## 2016-06-07 MED ORDER — SPIRONOLACTONE 50 MG PO TABS: 50.0000 mg | ORAL_TABLET | Freq: Every day | ORAL | 0 refills | Status: DC | PRN

## 2016-06-07 MED ORDER — CITALOPRAM HYDROBROMIDE 40 MG PO TABS: 40.0000 mg | ORAL_TABLET | Freq: Every day | ORAL | 0 refills | Status: DC

## 2016-08-13 ENCOUNTER — Other Ambulatory Visit: Payer: Self-pay | Admitting: Internal Medicine

## 2016-08-15 NOTE — Telephone Encounter (Signed)
Pt last refill on tramadol was on 07/19/16.  Last OV and labs were on 02/01/16 with no future scheduled appts.

## 2016-08-15 NOTE — Telephone Encounter (Signed)
Refills denied .  .tramadol is a controlled substance and she must be seen every 3 months to continue refills.

## 2016-08-18 ENCOUNTER — Encounter: Payer: Self-pay | Admitting: Internal Medicine

## 2016-08-20 ENCOUNTER — Other Ambulatory Visit: Payer: Self-pay | Admitting: Internal Medicine

## 2016-08-20 MED ORDER — PREDNISONE 10 MG PO TABS: ORAL_TABLET | ORAL | 0 refills | Status: DC

## 2016-08-20 MED ORDER — ALBUTEROL SULFATE HFA 108 (90 BASE) MCG/ACT IN AERS: 2.0000 | INHALATION_SPRAY | Freq: Four times a day (QID) | RESPIRATORY_TRACT | 0 refills | Status: DC | PRN

## 2016-09-19 ENCOUNTER — Other Ambulatory Visit: Payer: Self-pay | Admitting: Internal Medicine

## 2016-09-19 ENCOUNTER — Encounter: Payer: Self-pay | Admitting: Internal Medicine

## 2016-10-11 ENCOUNTER — Ambulatory Visit (INDEPENDENT_AMBULATORY_CARE_PROVIDER_SITE_OTHER): Payer: Managed Care, Other (non HMO) | Admitting: Internal Medicine

## 2016-10-11 ENCOUNTER — Encounter: Payer: Self-pay | Admitting: Internal Medicine

## 2016-10-11 ENCOUNTER — Telehealth: Payer: Self-pay | Admitting: *Deleted

## 2016-10-11 VITALS — BP 110/68 | HR 72 | Temp 97.6°F | Resp 16 | Ht 69.5 in | Wt 288.8 lb

## 2016-10-11 DIAGNOSIS — E7800 Pure hypercholesterolemia, unspecified: Secondary | ICD-10-CM | POA: Diagnosis not present

## 2016-10-11 DIAGNOSIS — D125 Benign neoplasm of sigmoid colon: Secondary | ICD-10-CM

## 2016-10-11 DIAGNOSIS — R0683 Snoring: Secondary | ICD-10-CM | POA: Diagnosis not present

## 2016-10-11 DIAGNOSIS — M5431 Sciatica, right side: Secondary | ICD-10-CM | POA: Diagnosis not present

## 2016-10-11 DIAGNOSIS — R7301 Impaired fasting glucose: Secondary | ICD-10-CM | POA: Diagnosis not present

## 2016-10-11 DIAGNOSIS — L989 Disorder of the skin and subcutaneous tissue, unspecified: Secondary | ICD-10-CM | POA: Diagnosis not present

## 2016-10-11 DIAGNOSIS — K760 Fatty (change of) liver, not elsewhere classified: Secondary | ICD-10-CM | POA: Diagnosis not present

## 2016-10-11 DIAGNOSIS — Z716 Tobacco abuse counseling: Secondary | ICD-10-CM

## 2016-10-11 DIAGNOSIS — D485 Neoplasm of uncertain behavior of skin: Secondary | ICD-10-CM

## 2016-10-11 DIAGNOSIS — E669 Obesity, unspecified: Secondary | ICD-10-CM | POA: Diagnosis not present

## 2016-10-11 MED ORDER — LIRAGLUTIDE -WEIGHT MANAGEMENT 18 MG/3ML ~~LOC~~ SOPN: 0.6000 mg | PEN_INJECTOR | Freq: Every day | SUBCUTANEOUS | 0 refills | Status: DC

## 2016-10-11 MED ORDER — SPIRONOLACTONE 50 MG PO TABS: 50.0000 mg | ORAL_TABLET | Freq: Every day | ORAL | 0 refills | Status: DC | PRN

## 2016-10-11 MED ORDER — TRAMADOL HCL 50 MG PO TABS: ORAL_TABLET | ORAL | 5 refills | Status: DC

## 2016-10-11 NOTE — Progress Notes (Signed)
Subjective:  Patient ID: Lisa Valencia, female    DOB: 05-Jan-1961  Age: 56 y.o. MRN: [id number]  CC: The primary encounter diagnosis was Neoplasm of uncertain behavior of skin of chest. Diagnoses of Impaired fasting glucose, Pure hypercholesterolemia, Obesity (BMI 30-39.9), Benign neoplasm of sigmoid colon, Sciatica of right side, Snoring, Skin lesion of chest wall, Hepatic steatosis, and Tobacco abuse counseling were also pertinent to this visit.  HPI Lisa Valencia presents for follow up on prediabetes,  Obesity ,  Tobacco abuse and chronic low back pain . Last seen in July   obesity:  Has gained 11 lbs BMI now 42. 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 Pan is aggravated by sitting,  Transitioning to stand,  But not by walking.    Had MRI in 2014  Has a bulging disk abutting the L4 nerve root, on the right . Elsner recommended fish oil high dose . and avoiding yard labor    Outpatient Medications Prior to Visit  Medication Sig Dispense Refill  . albuterol (PROVENTIL HFA;VENTOLIN HFA) 108 (90 Base) MCG/ACT inhaler Inhale 2 puffs into the lungs every 6 (six) hours as needed for wheezing or shortness of breath. 1 Inhaler 0  . aspirin 81 MG tablet Take 81 mg by mouth daily.    . celecoxib (CELEBREX) 200 MG capsule TAKE 1 CAPSULE TWICE DAILY 180 capsule 3  . cetirizine (ZYRTEC) 10 MG tablet Take 10 mg by mouth daily.    . citalopram (CELEXA) 40 MG tablet Take 1 tablet (40 mg total) by mouth daily. 90 tablet 0  . conjugated estrogens (PREMARIN) vaginal cream Place one applicatorful vaginally two to three times weekly 42.5 g 12  . Omega-3 Fatty Acids (FISH OIL) 1000 MG CAPS Take 1 capsule by mouth daily.    . pantoprazole (PROTONIX) 40 MG tablet TAKE ONE TABLET BY  MOUTH TWICE DAILY 180 tablet 3  . spironolactone (ALDACTONE) 50 MG tablet Take 1 tablet (50 mg total) by mouth daily as needed. 90 tablet 0  . traMADol (ULTRAM) 50 MG tablet TAKE 1 TO 2 TABLETS EVERY 6 HOURS AS NEEDED FOR SEVERE BACK PAIN MAX OF 6 A DAY 180 tablet 2  . ciprofloxacin (CIPRO) 250 MG tablet Take 1 tablet (250 mg total) by mouth 2 (two) times daily. (Patient not taking: Reported on 10/11/2016) 10 tablet 0  . Multiple Vitamin (MULTIVITAMIN) tablet Take 1 tablet by mouth daily.    . predniSONE (DELTASONE) 10 MG tablet 6 tablets on Day 1 , then reduce by 1 tablet daily until gone (Patient not taking: Reported on 10/11/2016) 21 tablet 0   No facility-administered medications prior to visit.     Review of Systems;  Patient denies headache, fevers, malaise, unintentional weight loss, skin rash, eye pain, sinus congestion and sinus pain, sore throat, dysphagia,  hemoptysis , cough, dyspnea, wheezing, chest pain, palpitations, orthopnea, edema, abdominal pain, nausea, melena, diarrhea, constipation, flank pain, dysuria, hematuria, urinary  Frequency, nocturia, numbness, tingling, seizures,  Focal weakness, Loss of consciousness,  Tremor, insomnia, depression, anxiety, and suicidal ideation.      Objective:  BP 110/68   Pulse 72   Temp 97.6 F (36.4 C) (Oral)   Resp 16  Ht 5' 9.5" (1.765 m)   Wt 288 lb 12.8 oz (131 kg)   SpO2 97%   BMI 42.04 kg/m   BP Readings from Last 3 Encounters:  10/11/16 110/68  02/01/16 118/74  10/15/15 116/72    Wt Readings from Last 3 Encounters:  10/11/16 288 lb 12.8 oz (131 kg)  02/01/16 277 lb (125.6 kg)  10/15/15 277 lb 8 oz (125.9 kg)    General appearance: alert, cooperative and appears stated age Ears: normal TM's and external ear canals both ears Throat: lips, mucosa, and tongue normal; teeth and gums normal Neck: no adenopathy, no carotid bruit, supple, symmetrical, trachea midline and thyroid not enlarged, symmetric, no  tenderness/mass/nodules Back: symmetric, no curvature. ROM normal. No CVA tenderness. Lungs: clear to auscultation bilaterally Heart: regular rate and rhythm, S1, S2 normal, no murmur, click, rub or gallop Abdomen: soft, non-tender; bowel sounds normal; no masses,  no organomegaly Pulses: 2+ and symmetric Skin: Skin color, texture, turgor normal. No rashes or lesions Lymph nodes: Cervical, supraclavicular, and axillary nodes normal.  Lab Results  Component Value Date   HGBA1C 5.8 02/01/2016   HGBA1C 5.9 05/06/2015   HGBA1C 5.8 01/26/2015    Lab Results  Component Value Date   CREATININE 0.88 10/11/2016   CREATININE 0.88 02/01/2016   CREATININE 0.73 05/06/2015    Lab Results  Component Value Date   WBC 6.0 02/01/2016   HGB 14.3 02/01/2016   HCT 41.8 02/01/2016   PLT 187.0 02/01/2016   GLUCOSE 98 10/11/2016   CHOL 245 (H) 10/11/2016   TRIG (H) 10/11/2016    472.0 Triglyceride is over 400; calculations on Lipids are invalid.   HDL 39.60 10/11/2016   LDLDIRECT 149.0 10/11/2016   LDLCALC 160 (H) 05/17/2012   ALT 46 (H) 10/11/2016   AST 23 10/11/2016   NA 137 10/11/2016   K 4.6 10/11/2016   CL 102 10/11/2016   CREATININE 0.88 10/11/2016   BUN 19 10/11/2016   CO2 29 10/11/2016   TSH 0.76 02/01/2016   HGBA1C 5.8 02/01/2016   MICROALBUR 1.2 03/07/2014    Mm Screening Breast Tomo Bilateral  Result Date: 04/14/2016 CLINICAL DATA:  Screening. EXAM: 2D DIGITAL SCREENING BILATERAL MAMMOGRAM WITH CAD AND ADJUNCT TOMO COMPARISON:  Previous exam(s). ACR Breast Density Category b: There are scattered areas of fibroglandular density. FINDINGS: There are no findings suspicious for malignancy. Images were processed with CAD. IMPRESSION: No mammographic evidence of malignancy. A result letter of this screening mammogram will be mailed directly to the patient. RECOMMENDATION: Screening mammogram in one year. (Code:SM-B-01Y) BI-RADS CATEGORY  1: Negative. Electronically Signed   By: Pamelia Hoit M.D.   On: 04/14/2016 09:04    Assessment & Plan:   Problem List Items Addressed This Visit    Benign neoplasm of sigmoid colon    Hyperplastic polyp, sigmoid colon and lipoma,  Nov 2016 colonoscopy       Hepatic steatosis    Weight loss, low GI diet and exercise recommended.       Hyperlipidemia   Relevant Medications   spironolactone (ALDACTONE) 50 MG tablet   Other Relevant Orders   Lipid panel (Completed)   Impaired fasting glucose    Encouraged weight, regular participation in aerobic exercise and adherence to a low glycemic index diet.  A1c is still < 6.0   Lab Results  Component Value Date   HGBA1C 5.8 02/01/2016   Lab Results  Component Value Date   MICROALBUR 1.2 03/07/2014  Relevant Orders   Comprehensive metabolic panel (Completed)   Obesity (BMI 30-39.9)    BI now 70.  I have addressed  BMI and recommended wt loss of 10% of body weight over the next 6 months using a low glycemic index diet and regular exercise a minimum of 5 days per week. saxenda discussed and recommended,  awaiting PA      Relevant Medications   Liraglutide -Weight Management (SAXENDA) 18 MG/3ML SOPN   Sciatica    Secondary to L4 nerve root on the right abutting bulging disk by recent MR.  Recent flare triggered by several hours of yard work , resulted in a lost work x 1 day. Recommended weight loss        Relevant Medications   Liraglutide -Weight Management (SAXENDA) 18 MG/3ML SOPN   Skin lesion of chest wall    She has a horny mass on left upper chest all that has been present for several months.  Referral to [facility]      Snoring    She deferred the sleep study that was ordered      Tobacco abuse counseling    Spent 3 minutes discussing risk of continued tobacco abuse, including but not limited to CAD, PAD, hypertension, and CA.  sHe is not interested in pharmacotherapy at this time.       Other Visit Diagnoses    Neoplasm of  uncertain behavior of skin of chest    -  Primary   Relevant Orders   Ambulatory referral to Dermatology      I have discontinued Ms. Romulus's multivitamin, ciprofloxacin, and predniSONE. I am also having her start on Liraglutide -Weight Management. Additionally, I am having her maintain her aspirin, cetirizine, Fish Oil, celecoxib, pantoprazole, conjugated estrogens, citalopram, albuterol, spironolactone, and traMADol.  Meds ordered this encounter  Medications  . Liraglutide -Weight Management (SAXENDA) 18 MG/3ML SOPN    Sig: Inject 0.6 mg into the skin daily. Increase dose weekly as follows: Week 2: 1.2 mg daily ; Week 3: 1.8 mg daily; Week 4: 2.4 mg daily    Dispense:  9 mL    Refill:  0  . spironolactone (ALDACTONE) 50 MG tablet    Sig: Take 1 tablet (50 mg total) by mouth daily as needed.    Dispense:  90 tablet    Refill:  0  . traMADol (ULTRAM) 50 MG tablet    Sig: TAKE 1 TO 2 TABLETS EVERY 6 HOURS AS NEEDED FOR SEVERE BACK PAIN MAX OF 6 A DAY    Dispense:  180 tablet    Refill:  5    Medications Discontinued During This Encounter  Medication Reason  . ciprofloxacin (CIPRO) 250 MG tablet Therapy completed  . Multiple Vitamin (MULTIVITAMIN) tablet Patient has not taken in last 30 days  . predniSONE (DELTASONE) 10 MG tablet Therapy completed  . spironolactone (ALDACTONE) 50 MG tablet Reorder  . traMADol (ULTRAM) 50 MG tablet Reorder    Follow-up: Return in about 3 months (around 01/11/2017).   Crecencio Mc, MD

## 2016-10-11 NOTE — Progress Notes (Signed)
Pre visit review using our clinic review tool, if applicable. No additional management support is needed unless otherwise documented below in the visit note. 

## 2016-10-11 NOTE — Telephone Encounter (Signed)
Patient requested to have a PA for the medication Liraglutide, due to expense, or have a less expensive Rx sent to the pharmacy  Pt contact [phone]

## 2016-10-11 NOTE — Patient Instructions (Addendum)
I want you walking for 30 minutes daily .    I am recommending use of the medication called Saxenda to help you lose weight.  It is similar to a a medicine that is used to treat diabetes called Victoza,  So It may lower your blood sugars .   It is injected daily in incrementally increasing doses (if tolerated,  Nausea usually resolves in a few days)"  0.6 mg daily   Week 1 1.2 mg daily Week 2 1.8 mg  Daly Week 3 2.4 mg daily Week 4 3.0 mg daily Week 5 and ongoing   I will see you again in 3-4 months    You might want to try a premixed protein drink called Premier Protein shake for breakfast or late night snack . It is great tasting,   very low sugar and available of < $2 serving at [facility] and  In bulk for $1.50/serving at [employer] and [employer]  .    Nutritional analysis :  160 cal  30 g protein  1 g sugar 50% calcium needs   Vladimir Faster and [employer]

## 2016-10-12 DIAGNOSIS — L989 Disorder of the skin and subcutaneous tissue, unspecified: Secondary | ICD-10-CM | POA: Insufficient documentation

## 2016-10-12 LAB — LDL CHOLESTEROL, DIRECT: Direct LDL: 149 mg/dL

## 2016-10-12 LAB — LIPID PANEL
Cholesterol: 245 mg/dL — ABNORMAL HIGH (ref 0–200)
HDL: 39.6 mg/dL (ref >39.00)
Total CHOL/HDL Ratio: 6
Triglycerides: 472 mg/dL — ABNORMAL HIGH (ref 0.0–149.0)

## 2016-10-12 LAB — COMPREHENSIVE METABOLIC PANEL
ALT: 46 U/L — ABNORMAL HIGH (ref 0–35)
AST: 23 U/L (ref 0–37)
Albumin: 4.7 g/dL (ref 3.5–5.2)
Alkaline Phosphatase: 41 U/L (ref 39–117)
BUN: 19 mg/dL (ref 6–23)
CO2: 29 mEq/L (ref 19–32)
Calcium: 10 mg/dL (ref 8.4–10.5)
Chloride: 102 mEq/L (ref 96–112)
Creatinine, Ser: 0.88 mg/dL (ref 0.40–1.20)
GFR: 70.71 mL/min (ref >60.00)
Glucose, Bld: 98 mg/dL (ref 70–99)
Potassium: 4.6 mEq/L (ref 3.5–5.1)
Sodium: 137 mEq/L (ref 135–145)
Total Bilirubin: 0.3 mg/dL (ref 0.2–1.2)
Total Protein: 7.5 g/dL (ref 6.0–8.3)

## 2016-10-12 NOTE — Assessment & Plan Note (Signed)
Encouraged weight, regular participation in aerobic exercise and adherence to a low glycemic index diet.  A1c is still < 6.0   Lab Results  Component Value Date   HGBA1C 5.8 02/01/2016   Lab Results  Component Value Date   MICROALBUR 1.2 03/07/2014

## 2016-10-12 NOTE — Assessment & Plan Note (Signed)
She has a horny mass on left upper chest all that has been present for several months.  Referral to [facility] dermatology

## 2016-10-12 NOTE — Assessment & Plan Note (Signed)
She deferred the sleep study that was ordered

## 2016-10-12 NOTE — Assessment & Plan Note (Signed)
Spent 3 minutes discussing risk of continued tobacco abuse, including but not limited to CAD, PAD, hypertension, and CA.  sHe is not interested in pharmacotherapy at this time. 

## 2016-10-12 NOTE — Assessment & Plan Note (Signed)
Hyperplastic polyp, sigmoid colon and lipoma,  Nov 2016 colonoscopy

## 2016-10-12 NOTE — Telephone Encounter (Signed)
Have you seen a prior authorization for for this ?

## 2016-10-12 NOTE — Telephone Encounter (Signed)
No, patietn was just here yesterday.  Tempie can you handle PA for Saxenda?

## 2016-10-12 NOTE — Assessment & Plan Note (Signed)
Weight loss, low GI diet and exercise recommended.

## 2016-10-12 NOTE — Assessment & Plan Note (Addendum)
BI now 42.  I have addressed  BMI and recommended wt loss of 10% of body weight over the next 6 months using a low glycemic index diet and regular exercise a minimum of 5 days per week. saxenda discussed and recommended,  awaiting PA

## 2016-10-12 NOTE — Assessment & Plan Note (Signed)
Secondary to L4 nerve root on the right abutting bulging disk by recent MR.  Recent flare triggered by several hours of yard work , resulted in a lost work x 1 day. Recommended weight loss

## 2016-10-13 ENCOUNTER — Encounter: Payer: Self-pay | Admitting: Internal Medicine

## 2016-10-13 DIAGNOSIS — Z79899 Other long term (current) drug therapy: Secondary | ICD-10-CM

## 2016-10-18 ENCOUNTER — Other Ambulatory Visit: Payer: Self-pay | Admitting: Internal Medicine

## 2016-10-18 DIAGNOSIS — E669 Obesity, unspecified: Secondary | ICD-10-CM

## 2016-10-19 MED ORDER — SIMVASTATIN 20 MG PO TABS: 20.0000 mg | ORAL_TABLET | Freq: Every day | ORAL | 0 refills | Status: DC

## 2016-10-19 NOTE — Telephone Encounter (Signed)
PA started for Saxenda on Cover my meds awaiting insurance reply. Key: Lisa Valencia

## 2016-10-28 ENCOUNTER — Telehealth: Payer: Self-pay | Admitting: *Deleted

## 2016-10-28 NOTE — Telephone Encounter (Signed)
Yes it an appeal.

## 2016-10-28 NOTE — Telephone Encounter (Signed)
Cover my meds has requested a prior authorization for [place] [facility]

## 2016-10-28 NOTE — Telephone Encounter (Signed)
Per Cover my meds, you had worked on this and maybe its a appeal? Please advise? Thanks

## 2016-10-28 NOTE — Telephone Encounter (Signed)
Please advise 

## 2016-10-31 NOTE — Telephone Encounter (Signed)
Appeal filed on cover my meds.

## 2016-11-08 ENCOUNTER — Other Ambulatory Visit: Payer: Self-pay | Admitting: Internal Medicine

## 2016-11-08 ENCOUNTER — Encounter: Payer: Self-pay | Admitting: Internal Medicine

## 2016-11-08 DIAGNOSIS — L98499 Non-pressure chronic ulcer of skin of other sites with unspecified severity: Secondary | ICD-10-CM

## 2016-11-10 ENCOUNTER — Other Ambulatory Visit: Payer: Self-pay | Admitting: Internal Medicine

## 2016-11-10 MED ORDER — DOXYCYCLINE HYCLATE 100 MG PO TABS: 100.0000 mg | ORAL_TABLET | Freq: Two times a day (BID) | ORAL | 0 refills | Status: DC

## 2016-11-17 ENCOUNTER — Other Ambulatory Visit (INDEPENDENT_AMBULATORY_CARE_PROVIDER_SITE_OTHER): Payer: Managed Care, Other (non HMO)

## 2016-11-17 DIAGNOSIS — Z79899 Other long term (current) drug therapy: Secondary | ICD-10-CM | POA: Diagnosis not present

## 2016-11-17 LAB — COMPREHENSIVE METABOLIC PANEL
ALT: 41 U/L — ABNORMAL HIGH (ref 0–35)
AST: 23 U/L (ref 0–37)
Albumin: 4.7 g/dL (ref 3.5–5.2)
Alkaline Phosphatase: 40 U/L (ref 39–117)
BUN: 18 mg/dL (ref 6–23)
CO2: 32 mEq/L (ref 19–32)
Calcium: 10.1 mg/dL (ref 8.4–10.5)
Chloride: 103 mEq/L (ref 96–112)
Creatinine, Ser: 0.86 mg/dL (ref 0.40–1.20)
GFR: 72.58 mL/min (ref >60.00)
Glucose, Bld: 120 mg/dL — ABNORMAL HIGH (ref 70–99)
Potassium: 4.2 mEq/L (ref 3.5–5.1)
Sodium: 141 mEq/L (ref 135–145)
Total Bilirubin: 0.4 mg/dL (ref 0.2–1.2)
Total Protein: 7.4 g/dL (ref 6.0–8.3)

## 2016-11-18 ENCOUNTER — Other Ambulatory Visit: Payer: Self-pay | Admitting: Internal Medicine

## 2016-11-18 ENCOUNTER — Encounter: Payer: Self-pay | Admitting: Internal Medicine

## 2016-11-18 DIAGNOSIS — E7800 Pure hypercholesterolemia, unspecified: Secondary | ICD-10-CM

## 2016-11-18 DIAGNOSIS — R7301 Impaired fasting glucose: Secondary | ICD-10-CM

## 2016-11-22 NOTE — Telephone Encounter (Signed)
FYI

## 2016-11-30 ENCOUNTER — Encounter: Payer: Self-pay | Admitting: Internal Medicine

## 2016-12-05 ENCOUNTER — Other Ambulatory Visit: Payer: Self-pay | Admitting: Internal Medicine

## 2016-12-21 ENCOUNTER — Other Ambulatory Visit: Payer: Self-pay | Admitting: Internal Medicine

## 2017-01-11 ENCOUNTER — Ambulatory Visit: Payer: Managed Care, Other (non HMO) | Admitting: Internal Medicine

## 2017-02-12 DIAGNOSIS — M5136 Other intervertebral disc degeneration, lumbar region: Secondary | ICD-10-CM | POA: Insufficient documentation

## 2017-02-12 DIAGNOSIS — M51369 Other intervertebral disc degeneration, lumbar region without mention of lumbar back pain or lower extremity pain: Secondary | ICD-10-CM | POA: Insufficient documentation

## 2017-02-14 ENCOUNTER — Encounter: Payer: Self-pay | Admitting: Internal Medicine

## 2017-02-15 ENCOUNTER — Other Ambulatory Visit: Payer: Self-pay | Admitting: Internal Medicine

## 2017-02-15 MED ORDER — DICLOFENAC SODIUM 1 % TD GEL: 2.0000 g | Freq: Four times a day (QID) | TRANSDERMAL | 3 refills | Status: DC

## 2017-03-14 ENCOUNTER — Other Ambulatory Visit: Payer: Self-pay | Admitting: Physician Assistant

## 2017-03-14 DIAGNOSIS — M5416 Radiculopathy, lumbar region: Secondary | ICD-10-CM

## 2017-03-17 ENCOUNTER — Other Ambulatory Visit (INDEPENDENT_AMBULATORY_CARE_PROVIDER_SITE_OTHER): Payer: Managed Care, Other (non HMO)

## 2017-03-17 DIAGNOSIS — R7301 Impaired fasting glucose: Secondary | ICD-10-CM | POA: Diagnosis not present

## 2017-03-17 DIAGNOSIS — E7800 Pure hypercholesterolemia, unspecified: Secondary | ICD-10-CM

## 2017-03-17 LAB — LDL CHOLESTEROL, DIRECT: Direct LDL: 95 mg/dL

## 2017-03-17 LAB — MICROALBUMIN / CREATININE URINE RATIO
Creatinine,U: 84 mg/dL
Microalb Creat Ratio: 1.3 mg/g (ref 0.0–30.0)
Microalb, Ur: 1.1 mg/dL (ref 0.0–1.9)

## 2017-03-17 LAB — COMPREHENSIVE METABOLIC PANEL
ALT: 38 U/L — ABNORMAL HIGH (ref 0–35)
AST: 24 U/L (ref 0–37)
Albumin: 4.6 g/dL (ref 3.5–5.2)
Alkaline Phosphatase: 38 U/L — ABNORMAL LOW (ref 39–117)
BUN: 13 mg/dL (ref 6–23)
CO2: 31 mEq/L (ref 19–32)
Calcium: 10.1 mg/dL (ref 8.4–10.5)
Chloride: 99 mEq/L (ref 96–112)
Creatinine, Ser: 0.83 mg/dL (ref 0.40–1.20)
GFR: 75.53 mL/min (ref >60.00)
Glucose, Bld: 114 mg/dL — ABNORMAL HIGH (ref 70–99)
Potassium: 4.3 mEq/L (ref 3.5–5.1)
Sodium: 137 mEq/L (ref 135–145)
Total Bilirubin: 0.5 mg/dL (ref 0.2–1.2)
Total Protein: 7.4 g/dL (ref 6.0–8.3)

## 2017-03-17 LAB — LIPID PANEL
Cholesterol: 169 mg/dL (ref 0–200)
HDL: 42.8 mg/dL (ref >39.00)
NonHDL: 126.05
Total CHOL/HDL Ratio: 4
Triglycerides: 220 mg/dL — ABNORMAL HIGH (ref 0.0–149.0)
VLDL: 44 mg/dL — ABNORMAL HIGH (ref 0.0–40.0)

## 2017-03-17 LAB — HEMOGLOBIN A1C: Hgb A1c MFr Bld: 6.1 % (ref 4.6–6.5)

## 2017-03-22 ENCOUNTER — Other Ambulatory Visit: Payer: Self-pay | Admitting: Internal Medicine

## 2017-03-22 ENCOUNTER — Encounter: Payer: Self-pay | Admitting: Internal Medicine

## 2017-03-22 ENCOUNTER — Ambulatory Visit (INDEPENDENT_AMBULATORY_CARE_PROVIDER_SITE_OTHER): Payer: Managed Care, Other (non HMO) | Admitting: Internal Medicine

## 2017-03-22 VITALS — BP 122/78 | HR 76 | Temp 97.8°F | Resp 16 | Ht 69.5 in | Wt 288.0 lb

## 2017-03-22 DIAGNOSIS — Z1231 Encounter for screening mammogram for malignant neoplasm of breast: Secondary | ICD-10-CM

## 2017-03-22 DIAGNOSIS — K2100 Gastro-esophageal reflux disease with esophagitis, without bleeding: Secondary | ICD-10-CM

## 2017-03-22 DIAGNOSIS — Z0000 Encounter for general adult medical examination without abnormal findings: Secondary | ICD-10-CM | POA: Diagnosis not present

## 2017-03-22 DIAGNOSIS — R7301 Impaired fasting glucose: Secondary | ICD-10-CM

## 2017-03-22 DIAGNOSIS — Z1211 Encounter for screening for malignant neoplasm of colon: Secondary | ICD-10-CM | POA: Diagnosis not present

## 2017-03-22 DIAGNOSIS — Z1239 Encounter for other screening for malignant neoplasm of breast: Secondary | ICD-10-CM

## 2017-03-22 DIAGNOSIS — Z716 Tobacco abuse counseling: Secondary | ICD-10-CM

## 2017-03-22 DIAGNOSIS — M5431 Sciatica, right side: Secondary | ICD-10-CM | POA: Diagnosis not present

## 2017-03-22 DIAGNOSIS — E7800 Pure hypercholesterolemia, unspecified: Secondary | ICD-10-CM

## 2017-03-22 DIAGNOSIS — E669 Obesity, unspecified: Secondary | ICD-10-CM

## 2017-03-22 MED ORDER — TRAMADOL HCL 50 MG PO TABS: ORAL_TABLET | ORAL | 5 refills | Status: DC

## 2017-03-22 MED ORDER — PANTOPRAZOLE SODIUM 40 MG PO TBEC: 40.0000 mg | DELAYED_RELEASE_TABLET | Freq: Two times a day (BID) | ORAL | 1 refills | Status: DC

## 2017-03-22 MED ORDER — CITALOPRAM HYDROBROMIDE 40 MG PO TABS: 40.0000 mg | ORAL_TABLET | Freq: Every day | ORAL | 0 refills | Status: DC

## 2017-03-22 MED ORDER — LORCASERIN HCL 10 MG PO TABS: ORAL_TABLET | ORAL | 2 refills | Status: DC

## 2017-03-22 MED ORDER — ALBUTEROL SULFATE HFA 108 (90 BASE) MCG/ACT IN AERS: 2.0000 | INHALATION_SPRAY | Freq: Four times a day (QID) | RESPIRATORY_TRACT | 0 refills | Status: DC | PRN

## 2017-03-22 MED ORDER — SPIRONOLACTONE 50 MG PO TABS: 50.0000 mg | ORAL_TABLET | Freq: Every day | ORAL | 1 refills | Status: DC | PRN

## 2017-03-22 MED ORDER — SIMVASTATIN 20 MG PO TABS: 20.0000 mg | ORAL_TABLET | Freq: Every day | ORAL | 1 refills | Status: DC

## 2017-03-22 MED ORDER — CELECOXIB 200 MG PO CAPS: 200.0000 mg | ORAL_CAPSULE | Freq: Two times a day (BID) | ORAL | 1 refills | Status: DC

## 2017-03-22 NOTE — Progress Notes (Signed)
Patient ID: Lisa Valencia, female    DOB: [birth year]  Age: 56 y.o. MRN: [id number]  The patient is here for annual physical  examination and management of other chronic and acute problems.   Hyperplastic polyp Nov 2016  Has 3  2nd degree family members with history of colonic polyps.  .   10 yr follow up .  The risk factors are reflected in the social history.  The roster of all physicians providing medical care to patient - is listed in the Snapshot section of the chart.  Activities of daily living:  The patient is 100% independent in all ADLs: dressing, toileting, feeding as well as independent mobility  Home safety : The patient has smoke detectors in the home. They wear seatbelts.  There are no firearms at home. There is no violence in the home.   There is no risks for hepatitis, STDs or HIV. There is no   history of blood transfusion. They have no travel history to infectious disease endemic areas of the world.  The patient has seen their dentist in the last six month. They have seen their eye doctor in the last year.   They do not  have excessive sun exposure. Discussed the need for sun protection: hats, long sleeves and use of sunscreen if there is significant sun exposure.   Diet: the importance of a healthy diet is discussed. They do have a healthy diet.  The benefits of regular aerobic exercise were discussed. She is not exercising due to persistent back pain.   Depression screen: there are no signs or vegative symptoms of depression- irritability, change in appetite, anhedonia, sadness/tearfullness.  The following portions of the patient's history were reviewed and updated as appropriate: allergies, current medications, past family history, past medical history,  past surgical history, past social history  and problem list.  Visual acuity was not assessed per patient preference since she has regular follow up with her ophthalmologist. Hearing and body mass index were  assessed and reviewed.      CC: The primary encounter diagnosis was Breast cancer screening. Diagnoses of Tobacco abuse counseling, Special screening for malignant neoplasms, colon, Sciatica of right side, Routine general medical examination at a health care facility, Obesity (BMI 30-39.9), Impaired fasting glucose, Pure hypercholesterolemia, and Esophagitis, reflux were also pertinent to this visit.  Hip and back pain getting worse.  Not able to do yard work.  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 Had 12 day steroid pack in June which helped the back but not the left hip. Using 4 or 5 tramadol daily  Obesity:  Dit reviewed:  drinks a slimfast for bfast,   Lunch is grilled chicken on low carb bread; dinner has at least 2 starches.   In between snacks   toleraaing the simvastatin   And celebrex   Taking protonix twice daily ,  Still gets reflux with acidic foods  and coffee     History Lisa Valencia has a past medical history of Diverticulosis of intestine (2010); Esophagitis, reflux (2010 ); and GERD (gastroesophageal reflux disease).   She has a past surgical history that includes Joint replacement (2012); Appendectomy; Abdominal hysterectomy (2009); and Colonoscopy with propofol (N/A, 06/05/2015).   Her family history includes Breast cancer (age of onset: 21) in her paternal aunt; Cancer in her father, maternal grandfather, paternal aunt, paternal grandfather,  and paternal grandmother; Diabetes in her maternal grandfather and sister; Hyperlipidemia in her father.She reports that she has been smoking Cigarettes.  She has a 35.00 pack-year smoking history. She has never used smokeless tobacco. She reports that she drinks alcohol. She reports that she does not use drugs.  Outpatient Medications Prior to Visit  Medication Sig Dispense Refill  . aspirin 81 MG tablet Take 81 mg  by mouth daily.    . cetirizine (ZYRTEC) 10 MG tablet Take 10 mg by mouth daily.    Marland Kitchen conjugated estrogens (PREMARIN) vaginal cream Place one applicatorful vaginally two to three times weekly 42.5 g 12  . diclofenac sodium (VOLTAREN) 1 % GEL Apply 2 g topically 4 (four) times daily. 100 g 3  . Omega-3 Fatty Acids (FISH OIL) 1000 MG CAPS Take 1 capsule by mouth daily.    Marland Kitchen albuterol (PROVENTIL HFA;VENTOLIN HFA) 108 (90 Base) MCG/ACT inhaler Inhale 2 puffs into the lungs every 6 (six) hours as needed for wheezing or shortness of breath. 1 Inhaler 0  . celecoxib (CELEBREX) 200 MG capsule TAKE 1 CAPSULE BY MOUTH TWICE DAILY 180 capsule 1  . citalopram (CELEXA) 40 MG tablet Take 1 tablet (40 mg total) by mouth daily. 90 tablet 0  . pantoprazole (PROTONIX) 40 MG tablet TAKE ONE TABLET BY MOUTH TWICE DAILY 180 tablet 1  . simvastatin (ZOCOR) 20 MG tablet TAKE ONE TABLET BY MOUTH AT BEDTIME 90 tablet 1  . spironolactone (ALDACTONE) 50 MG tablet TAKE 1 TABLET BY MOUTH DAILY AS NEEDED 90 tablet 1  . traMADol (ULTRAM) 50 MG tablet TAKE 1 TO 2 TABLETS EVERY 6 HOURS AS NEEDED FOR SEVERE BACK PAIN MAX OF 6 A DAY 180 tablet 5  . doxycycline (VIBRA-TABS) 100 MG tablet Take 1 tablet (100 mg total) by mouth 2 (two) times daily. (Patient not taking: Reported on 03/22/2017) 20 tablet 0  . Liraglutide -Weight Management (SAXENDA) 18 MG/3ML SOPN Inject 0.6 mg into the skin daily. Increase dose weekly as follows: Week 2: 1.2 mg daily ; Week 3: 1.8 mg daily; Week 4: 2.4 mg daily (Patient not taking: Reported on 03/22/2017) 9 mL 0   No facility-administered medications prior to visit.     Review of Systems   Patient denies headache, fevers, malaise, unintentional weight loss, skin rash, eye pain, sinus congestion and sinus pain, sore throat, dysphagia,  hemoptysis , cough, dyspnea, wheezing, chest pain, palpitations, orthopnea, edema, abdominal pain, nausea, melena, diarrhea, constipation, flank pain, dysuria, hematuria,  urinary  Frequency, nocturia, numbness, tingling, seizures,  Focal weakness, Loss of consciousness,  Tremor, insomnia, depression, anxiety, and suicidal ideation.     Objective:  BP 122/78 (BP Location: Left Arm, Patient Position: Sitting, Cuff Size: Large)   Pulse 76   Temp 97.8 F (36.6 C) (Oral)   Resp 16   Ht 5' 9.5" (1.765 m)   Wt 288 lb (130.6 kg)   SpO2 94%   BMI 41.92 kg/m   Physical Exam   General appearance: alert, cooperative and appears stated age Head: Normocephalic, without obvious abnormality, atraumatic Eyes: conjunctivae/corneas clear. PERRL, EOM's intact. Fundi benign. Ears: normal TM's and external ear canals both ears Nose: Nares normal. Septum midline. Mucosa normal. No drainage or sinus tenderness. Throat: lips, mucosa, and tongue normal; teeth and gums normal Neck: no adenopathy, no carotid bruit, no JVD, supple, symmetrical, trachea midline and thyroid not enlarged, symmetric, no tenderness/mass/nodules Lungs: clear to auscultation bilaterally Breasts: normal appearance, no masses or tenderness Heart: regular rate and  rhythm, S1, S2 normal, no murmur, click, rub or gallop Abdomen: soft, non-tender; bowel sounds normal; no masses,  no organomegaly Extremities: extremities normal, atraumatic, no cyanosis or edema Pulses: 2+ and symmetric Skin: Skin color, texture, turgor normal. No rashes or lesions Neurologic: Alert and oriented X 3, normal strength and tone. Normal symmetric reflexes. Normal coordination and gait.     Assessment & Plan:   Problem List Items Addressed This Visit    Esophagitis, reflux    Advised to add pepcid in the afternoon,  Continue protonix in the morning       Hyperlipidemia    Improved with simvastatin  Lab Results  Component Value Date   CHOL 169 03/17/2017   HDL 42.80 03/17/2017   LDLCALC 160 (H) 05/17/2012   LDLDIRECT 95.0 03/17/2017   TRIG 220.0 (H) 03/17/2017   CHOLHDL 4 03/17/2017   Lab Results  Component Value  Date   ALT 38 (H) 03/17/2017   AST 24 03/17/2017   ALKPHOS 38 (L) 03/17/2017   BILITOT 0.5 03/17/2017          Relevant Medications   spironolactone (ALDACTONE) 50 MG tablet   simvastatin (ZOCOR) 20 MG tablet   Impaired fasting glucose    Encouraged weight, regular participation in aerobic exercise and adherence to a low glycemic index diet.    Lab Results  Component Value Date   HGBA1C 6.1 03/17/2017   Lab Results  Component Value Date   MICROALBUR 1.1 03/17/2017                 Obesity (BMI 30-39.9)    She has had difficulty losing weight due to increased appetite.  Discussed a trial of  Belviq.  She is aware of the possible side effects and risks and understands that    The medication will be discontinued if she has not lost 5% of her body weight over the next 3 months, which , based on today's weight is 14 lbs.      Relevant Medications   Lorcaserin HCl 10 MG TABS   Routine general medical examination at a health care facility    Annual comprehensive preventive exam was done as well as an evaluation and management of chronic conditions .  During the course of the visit the patient was educated and counseled about appropriate screening and preventive services including :  diabetes screening, lipid analysis with projected  10 year  risk for CAD , nutrition counseling, breast, cervical and colorectal cancer screening, and recommended immunizations.  Printed recommendations for health maintenance screenings was given      Sciatica    Persistent despite conservative treatment.  MRI has been ordered. Tramadol refills given      Relevant Medications   Lorcaserin HCl 10 MG TABS   citalopram (CELEXA) 40 MG tablet   Special screening for malignant neoplasms, colon    Despite the presence of colonic polyps in 3 second degree relatives, her screening interval is 10 years.      Tobacco abuse counseling    Spent 3 minutes discussing risk of continued tobacco abuse,  including but not limited to CAD, PAD, hypertension, and CA.  sHe is not interested in pharmacotherapy at this time.       Other Visit Diagnoses    Breast cancer screening    -  Primary   Relevant Orders   MM SCREENING BREAST TOMO BILATERAL      I have discontinued Ms. Capizzi's Liraglutide -Weight Management and doxycycline. I have  also changed her celecoxib, spironolactone, simvastatin, and pantoprazole. Additionally, I am having her start on Lorcaserin HCl. Lastly, I am having her maintain her aspirin, cetirizine, Fish Oil, conjugated estrogens, diclofenac sodium, traMADol, albuterol, and citalopram.  Meds ordered this encounter  Medications  . Lorcaserin HCl 10 MG TABS    Sig: 10 mg twice daily (before breakfast and by 3 pm )    Dispense:  60 tablet    Refill:  2  . traMADol (ULTRAM) 50 MG tablet    Sig: TAKE 1 TO 2 TABLETS EVERY 6 HOURS AS NEEDED FOR SEVERE BACK PAIN MAX OF 6 A DAY    Dispense:  180 tablet    Refill:  5  . albuterol (PROVENTIL HFA;VENTOLIN HFA) 108 (90 Base) MCG/ACT inhaler    Sig: Inhale 2 puffs into the lungs every 6 (six) hours as needed for wheezing or shortness of breath.    Dispense:  1 Inhaler    Refill:  0    PLEASE SUBSTITUTE WHATEVER ALBUTEROL MDI HER INSURANCE WILL COVER.  THEY ARE ALL THE SAME ORDER IN EPIC  . celecoxib (CELEBREX) 200 MG capsule    Sig: Take 1 capsule (200 mg total) by mouth 2 (two) times daily.    Dispense:  180 capsule    Refill:  1  . citalopram (CELEXA) 40 MG tablet    Sig: Take 1 tablet (40 mg total) by mouth daily.    Dispense:  90 tablet    Refill:  0  . spironolactone (ALDACTONE) 50 MG tablet    Sig: Take 1 tablet (50 mg total) by mouth daily as needed.    Dispense:  90 tablet    Refill:  1  . simvastatin (ZOCOR) 20 MG tablet    Sig: Take 1 tablet (20 mg total) by mouth at bedtime.    Dispense:  90 tablet    Refill:  1  . pantoprazole (PROTONIX) 40 MG tablet    Sig: Take 1 tablet (40 mg total) by mouth 2 (two)  times daily.    Dispense:  180 tablet    Refill:  1    Medications Discontinued During This Encounter  Medication Reason  . doxycycline (VIBRA-TABS) 100 MG tablet Patient has not taken in last 30 days  . Liraglutide -Weight Management (SAXENDA) 18 MG/3ML SOPN Patient has not taken in last 30 days  . traMADol (ULTRAM) 50 MG tablet Reorder  . albuterol (PROVENTIL HFA;VENTOLIN HFA) 108 (90 Base) MCG/ACT inhaler Reorder  . celecoxib (CELEBREX) 200 MG capsule Reorder  . citalopram (CELEXA) 40 MG tablet Reorder  . spironolactone (ALDACTONE) 50 MG tablet Reorder  . simvastatin (ZOCOR) 20 MG tablet Reorder  . pantoprazole (PROTONIX) 40 MG tablet Reorder    Follow-up: Return in about 3 months (around 06/21/2017).   Crecencio Mc, MD

## 2017-03-22 NOTE — Patient Instructions (Addendum)
Try taking generic Pepcid before offending meals. (ok to continue protonix and tums prn)  Trial of Belviq for pharmacotherapy (if affordable ) for appetite suppression: take it twice daily before meals .  (adjust dosing as needed to avoid insomnia). GOAL IS 14 LBS IN 3 MONTHS    Your cholesterol has improved on the simvastatin..     Your  fasting glucose has never been  diagnostic of diabetes; but your A1c continues to suggest that  you are at risk for developing type 2 Diabetes so I would Continue SIMVASTATIN for risk reduction of coronary artery disease and continue trying to make lifestyle changes, including weight loss through regular participation in exercise and following a  LOW GLYCEMIC INDEX DIET.     Health Maintenance for Postmenopausal Women Menopause is a normal process in which your reproductive ability comes to an end. This process happens gradually over a span of months to years, usually between the ages of 42 and 39. Menopause is complete when you have missed 12 consecutive menstrual periods. It is important to talk with your health care provider about some of the most common conditions that affect postmenopausal women, such as heart disease, cancer, and bone loss (osteoporosis). Adopting a healthy lifestyle and getting preventive care can help to promote your health and wellness. Those actions can also lower your chances of developing some of these common conditions. What should I know about menopause? During menopause, you may experience a number of symptoms, such as:  Moderate-to-severe hot flashes.  Night sweats.  Decrease in sex drive.  Mood swings.  Headaches.  Tiredness.  Irritability.  Memory problems.  Insomnia.  Choosing to treat or not to treat menopausal changes is an individual decision that you make with your health care provider. What should I know about hormone replacement therapy and supplements? Hormone therapy products are effective for treating  symptoms that are associated with menopause, such as hot flashes and night sweats. Hormone replacement carries certain risks, especially as you become older. If you are thinking about using estrogen or estrogen with progestin treatments, discuss the benefits and risks with your health care provider. What should I know about heart disease and stroke? Heart disease, heart attack, and stroke become more likely as you age. This may be due, in part, to the hormonal changes that your body experiences during menopause. These can affect how your body processes dietary fats, triglycerides, and cholesterol. Heart attack and stroke are both medical emergencies. There are many things that you can do to help prevent heart disease and stroke:  Have your blood pressure checked at least every 1-2 years. High blood pressure causes heart disease and increases the risk of stroke.  If you are 84-12 years old, ask your health care provider if you should take aspirin to prevent a heart attack or a stroke.  Do not use any tobacco products, including cigarettes, chewing tobacco, or electronic cigarettes. If you need help quitting, ask your health care provider.  It is important to eat a healthy diet and maintain a healthy weight. ? Be sure to include plenty of vegetables, fruits, low-fat dairy products, and lean protein. ? Avoid eating foods that are high in solid fats, added sugars, or salt (sodium).  Get regular exercise. This is one of the most important things that you can do for your health. ? Try to exercise for at least 150 minutes each week. The type of exercise that you do should increase your heart rate and make you sweat.  This is known as moderate-intensity exercise. ? Try to do strengthening exercises at least twice each week. Do these in addition to the moderate-intensity exercise.  Know your numbers.Ask your health care provider to check your cholesterol and your blood glucose. Continue to have your blood  tested as directed by your health care provider.  What should I know about cancer screening? There are several types of cancer. Take the following steps to reduce your risk and to catch any cancer development as early as possible. Breast Cancer  Practice breast self-awareness. ? This means understanding how your breasts normally appear and feel. ? It also means doing regular breast self-exams. Let your health care provider know about any changes, no matter how small.  If you are 77 or older, have a clinician do a breast exam (clinical breast exam or CBE) every year. Depending on your age, family history, and medical history, it may be recommended that you also have a yearly breast X-ray (mammogram).  If you have a family history of breast cancer, talk with your health care provider about genetic screening.  If you are at high risk for breast cancer, talk with your health care provider about having an MRI and a mammogram every year.  Breast cancer (BRCA) gene test is recommended for women who have family members with BRCA-related cancers. Results of the assessment will determine the need for genetic counseling and BRCA1 and for BRCA2 testing. BRCA-related cancers include these types: ? Breast. This occurs in males or females. ? Ovarian. ? Tubal. This may also be called fallopian tube cancer. ? Cancer of the abdominal or pelvic lining (peritoneal cancer). ? Prostate. ? Pancreatic.  Cervical, Uterine, and Ovarian Cancer Your health care provider may recommend that you be screened regularly for cancer of the pelvic organs. These include your ovaries, uterus, and vagina. This screening involves a pelvic exam, which includes checking for microscopic changes to the surface of your cervix (Pap test).  For women ages 21-65, health care providers may recommend a pelvic exam and a Pap test every three years. For women ages 48-65, they may recommend the Pap test and pelvic exam, combined with testing  for human papilloma virus (HPV), every five years. Some types of HPV increase your risk of cervical cancer. Testing for HPV may also be done on women of any age who have unclear Pap test results.  Other health care providers may not recommend any screening for nonpregnant women who are considered low risk for pelvic cancer and have no symptoms. Ask your health care provider if a screening pelvic exam is right for you.  If you have had past treatment for cervical cancer or a condition that could lead to cancer, you need Pap tests and screening for cancer for at least 20 years after your treatment. If Pap tests have been discontinued for you, your risk factors (such as having a new sexual partner) need to be reassessed to determine if you should start having screenings again. Some women have medical problems that increase the chance of getting cervical cancer. In these cases, your health care provider may recommend that you have screening and Pap tests more often.  If you have a family history of uterine cancer or ovarian cancer, talk with your health care provider about genetic screening.  If you have vaginal bleeding after reaching menopause, tell your health care provider.  There are currently no reliable tests available to screen for ovarian cancer.  Lung Cancer Lung cancer screening is recommended  for adults 81-51 years old who are at high risk for lung cancer because of a history of smoking. A yearly low-dose CT scan of the lungs is recommended if you:  Currently smoke.  Have a history of at least 30 pack-years of smoking and you currently smoke or have quit within the past 15 years. A pack-year is smoking an average of one pack of cigarettes per day for one year.  Yearly screening should:  Continue until it has been 15 years since you quit.  Stop if you develop a health problem that would prevent you from having lung cancer treatment.  Colorectal Cancer  This type of cancer can be  detected and can often be prevented.  Routine colorectal cancer screening usually begins at age 86 and continues through age 67.  If you have risk factors for colon cancer, your health care provider may recommend that you be screened at an earlier age.  If you have a family history of colorectal cancer, talk with your health care provider about genetic screening.  Your health care provider may also recommend using home test kits to check for hidden blood in your stool.  A small camera at the end of a tube can be used to examine your colon directly (sigmoidoscopy or colonoscopy). This is done to check for the earliest forms of colorectal cancer.  Direct examination of the colon should be repeated every 5-10 years until age 43. However, if early forms of precancerous polyps or small growths are found or if you have a family history or genetic risk for colorectal cancer, you may need to be screened more often.  Skin Cancer  Check your skin from head to toe regularly.  Monitor any moles. Be sure to tell your health care provider: ? About any new moles or changes in moles, especially if there is a change in a mole's shape or color. ? If you have a mole that is larger than the size of a pencil eraser.  If any of your family members has a history of skin cancer, especially at a young age, talk with your health care provider about genetic screening.  Always use sunscreen. Apply sunscreen liberally and repeatedly throughout the day.  Whenever you are outside, protect yourself by wearing long sleeves, pants, a wide-brimmed hat, and sunglasses.  What should I know about osteoporosis? Osteoporosis is a condition in which bone destruction happens more quickly than new bone creation. After menopause, you may be at an increased risk for osteoporosis. To help prevent osteoporosis or the bone fractures that can happen because of osteoporosis, the following is recommended:  If you are 61-91 years old,  get at least 1,000 mg of calcium and at least 600 mg of vitamin D per day.  If you are older than age 92 but younger than age 89, get at least 1,200 mg of calcium and at least 600 mg of vitamin D per day.  If you are older than age 23, get at least 1,200 mg of calcium and at least 800 mg of vitamin D per day.  Smoking and excessive alcohol intake increase the risk of osteoporosis. Eat foods that are rich in calcium and vitamin D, and do weight-bearing exercises several times each week as directed by your health care provider. What should I know about how menopause affects my mental health? Depression may occur at any age, but it is more common as you become older. Common symptoms of depression include:  Low or sad mood.  Changes in sleep patterns.  Changes in appetite or eating patterns.  Feeling an overall lack of motivation or enjoyment of activities that you previously enjoyed.  Frequent crying spells.  Talk with your health care provider if you think that you are experiencing depression. What should I know about immunizations? It is important that you get and maintain your immunizations. These include:  Tetanus, diphtheria, and pertussis (Tdap) booster vaccine.  Influenza every year before the flu season begins.  Pneumonia vaccine.  Shingles vaccine.  Your health care provider may also recommend other immunizations. This information is not intended to replace advice given to you by your health care provider. Make sure you discuss any questions you have with your health care provider. Document Released: 08/26/2005 Document Revised: 01/22/2016 Document Reviewed: 04/07/2015 Elsevier Interactive Patient Education  [employer]. .

## 2017-03-23 NOTE — Assessment & Plan Note (Signed)
She has had difficulty losing weight due to increased appetite.  Discussed a trial of  Belviq.  She is aware of the possible side effects and risks and understands that    The medication will be discontinued if she has not lost 5% of her body weight over the next 3 months, which , based on today's weight is 14 lbs.

## 2017-03-23 NOTE — Assessment & Plan Note (Signed)
Annual comprehensive preventive exam was done as well as an evaluation and management of chronic conditions .  During the course of the visit the patient was educated and counseled about appropriate screening and preventive services including :  diabetes screening, lipid analysis with projected  10 year  risk for CAD , nutrition counseling, breast, cervical and colorectal cancer screening, and recommended immunizations.  Printed recommendations for health maintenance screenings was given 

## 2017-03-23 NOTE — Assessment & Plan Note (Signed)
Advised to add pepcid in the afternoon,  Continue protonix in the morning

## 2017-03-23 NOTE — Assessment & Plan Note (Signed)
Spent 3 minutes discussing risk of continued tobacco abuse, including but not limited to CAD, PAD, hypertension, and CA.  sHe is not interested in pharmacotherapy at this time. 

## 2017-03-23 NOTE — Assessment & Plan Note (Signed)
Despite the presence of colonic polyps in 3 second degree relatives, her screening interval is 10 years.

## 2017-03-23 NOTE — Assessment & Plan Note (Signed)
Improved with simvastatin  Lab Results  Component Value Date   CHOL 169 03/17/2017   HDL 42.80 03/17/2017   LDLCALC 160 (H) 05/17/2012   LDLDIRECT 95.0 03/17/2017   TRIG 220.0 (H) 03/17/2017   CHOLHDL 4 03/17/2017   Lab Results  Component Value Date   ALT 38 (H) 03/17/2017   AST 24 03/17/2017   ALKPHOS 38 (L) 03/17/2017   BILITOT 0.5 03/17/2017

## 2017-03-23 NOTE — Assessment & Plan Note (Signed)
Encouraged weight, regular participation in aerobic exercise and adherence to a low glycemic index diet.    Lab Results  Component Value Date   HGBA1C 6.1 03/17/2017   Lab Results  Component Value Date   MICROALBUR 1.1 03/17/2017

## 2017-03-23 NOTE — Assessment & Plan Note (Signed)
Persistent despite conservative treatment.  MRI has been ordered. Tramadol refills given

## 2017-03-24 ENCOUNTER — Ambulatory Visit
Admission: RE | Admit: 2017-03-24 | Discharge: 2017-03-24 | Disposition: A | Discharge status: Home / Self Care | Payer: Managed Care, Other (non HMO) | Source: Ambulatory Visit | Attending: Physician Assistant | Admitting: Physician Assistant

## 2017-03-24 DIAGNOSIS — M5126 Other intervertebral disc displacement, lumbar region: Secondary | ICD-10-CM | POA: Insufficient documentation

## 2017-03-24 DIAGNOSIS — M48061 Spinal stenosis, lumbar region without neurogenic claudication: Secondary | ICD-10-CM | POA: Insufficient documentation

## 2017-03-24 DIAGNOSIS — M5416 Radiculopathy, lumbar region: Secondary | ICD-10-CM | POA: Diagnosis not present

## 2017-04-04 ENCOUNTER — Other Ambulatory Visit: Payer: Self-pay | Admitting: Physician Assistant

## 2017-04-04 DIAGNOSIS — M1612 Unilateral primary osteoarthritis, left hip: Secondary | ICD-10-CM

## 2017-04-13 ENCOUNTER — Ambulatory Visit
Admission: RE | Admit: 2017-04-13 | Discharge: 2017-04-13 | Disposition: A | Discharge status: Home / Self Care | Payer: Managed Care, Other (non HMO) | Source: Ambulatory Visit | Attending: Physician Assistant | Admitting: Physician Assistant

## 2017-04-13 ENCOUNTER — Other Ambulatory Visit: Payer: Self-pay | Admitting: Physician Assistant

## 2017-04-13 DIAGNOSIS — G8929 Other chronic pain: Secondary | ICD-10-CM

## 2017-04-13 DIAGNOSIS — M545 Low back pain, unspecified: Secondary | ICD-10-CM

## 2017-04-13 DIAGNOSIS — M1612 Unilateral primary osteoarthritis, left hip: Secondary | ICD-10-CM

## 2017-04-13 MED ORDER — IOPAMIDOL (ISOVUE-M 200) INJECTION 41%
1.0000 mL | Freq: Once | INTRAMUSCULAR | Status: AC
  Administered 2017-04-13: 1 mL via EPIDURAL

## 2017-04-13 MED ORDER — METHYLPREDNISOLONE ACETATE 40 MG/ML INJ SUSP (RADIOLOG
120.0000 mg | Freq: Once | INTRAMUSCULAR | Status: AC
  Administered 2017-04-13: 120 mg via EPIDURAL

## 2017-04-13 MED ORDER — IOPAMIDOL (ISOVUE-M 200) INJECTION 41%: 1.0000 mL | Freq: Once | INTRAMUSCULAR | Status: DC

## 2017-04-13 MED ORDER — METHYLPREDNISOLONE ACETATE 40 MG/ML INJ SUSP (RADIOLOG: 120.0000 mg | Freq: Once | INTRAMUSCULAR | Status: DC

## 2017-04-13 NOTE — Telephone Encounter (Signed)
Error

## 2017-04-13 NOTE — Discharge Instructions (Signed)

## 2017-04-17 ENCOUNTER — Encounter: Payer: Self-pay | Admitting: Internal Medicine

## 2017-04-19 ENCOUNTER — Telehealth: Payer: Self-pay | Admitting: Internal Medicine

## 2017-04-19 NOTE — Telephone Encounter (Signed)
Patient is going to walkin when she gets off work due to she cannot get off work, [employer]

## 2017-04-19 NOTE — Telephone Encounter (Signed)
I no longer treat UTIs by phone or e mail.  I will have the office scheudle you an urgent visit today so we cal collect a urine specimen.   Regards,

## 2017-04-24 ENCOUNTER — Other Ambulatory Visit: Payer: Self-pay | Admitting: Physician Assistant

## 2017-04-24 DIAGNOSIS — M5416 Radiculopathy, lumbar region: Secondary | ICD-10-CM

## 2017-04-25 ENCOUNTER — Ambulatory Visit
Admission: RE | Admit: 2017-04-25 | Discharge: 2017-04-25 | Disposition: A | Discharge status: Home / Self Care | Payer: Managed Care, Other (non HMO) | Source: Ambulatory Visit | Attending: Internal Medicine | Admitting: Internal Medicine

## 2017-04-25 DIAGNOSIS — Z1231 Encounter for screening mammogram for malignant neoplasm of breast: Secondary | ICD-10-CM | POA: Insufficient documentation

## 2017-04-25 DIAGNOSIS — Z1239 Encounter for other screening for malignant neoplasm of breast: Secondary | ICD-10-CM

## 2017-05-12 ENCOUNTER — Ambulatory Visit
Admission: RE | Admit: 2017-05-12 | Discharge: 2017-05-12 | Disposition: A | Discharge status: Home / Self Care | Payer: Managed Care, Other (non HMO) | Source: Ambulatory Visit | Attending: Physician Assistant | Admitting: Physician Assistant

## 2017-05-12 DIAGNOSIS — M5416 Radiculopathy, lumbar region: Secondary | ICD-10-CM

## 2017-05-12 MED ORDER — METHYLPREDNISOLONE ACETATE 40 MG/ML INJ SUSP (RADIOLOG
120.0000 mg | Freq: Once | INTRAMUSCULAR | Status: AC
  Administered 2017-05-12: 120 mg via EPIDURAL

## 2017-05-12 MED ORDER — IOPAMIDOL (ISOVUE-M 200) INJECTION 41%
1.0000 mL | Freq: Once | INTRAMUSCULAR | Status: AC
  Administered 2017-05-12: 1 mL via EPIDURAL

## 2017-05-15 ENCOUNTER — Encounter: Payer: Self-pay | Admitting: Internal Medicine

## 2017-06-19 ENCOUNTER — Other Ambulatory Visit: Payer: Self-pay | Admitting: Internal Medicine

## 2017-06-19 NOTE — Telephone Encounter (Signed)
Refilled: 03/22/2017 Last OV: 03/22/2017 Next OV: 06/21/2017

## 2017-06-20 NOTE — Telephone Encounter (Signed)
Patient needs to document weight loss in this office before I will refill, because she had GAINED  weight last visit in September.  So no refills until she has an Therapist, sports visit  For weight

## 2017-06-21 ENCOUNTER — Encounter: Payer: Self-pay | Admitting: Internal Medicine

## 2017-06-21 ENCOUNTER — Ambulatory Visit (INDEPENDENT_AMBULATORY_CARE_PROVIDER_SITE_OTHER): Payer: Managed Care, Other (non HMO) | Admitting: Internal Medicine

## 2017-06-21 VITALS — BP 110/70 | HR 86 | Temp 97.4°F | Resp 15 | Ht 69.5 in | Wt 280.2 lb

## 2017-06-21 DIAGNOSIS — E7800 Pure hypercholesterolemia, unspecified: Secondary | ICD-10-CM

## 2017-06-21 DIAGNOSIS — M5136 Other intervertebral disc degeneration, lumbar region: Secondary | ICD-10-CM | POA: Diagnosis not present

## 2017-06-21 DIAGNOSIS — E669 Obesity, unspecified: Secondary | ICD-10-CM | POA: Diagnosis not present

## 2017-06-21 DIAGNOSIS — M51369 Other intervertebral disc degeneration, lumbar region without mention of lumbar back pain or lower extremity pain: Secondary | ICD-10-CM

## 2017-06-21 DIAGNOSIS — R7301 Impaired fasting glucose: Secondary | ICD-10-CM | POA: Diagnosis not present

## 2017-06-21 DIAGNOSIS — R7303 Prediabetes: Secondary | ICD-10-CM

## 2017-06-21 MED ORDER — LORCASERIN HCL 10 MG PO TABS: ORAL_TABLET | ORAL | 2 refills | Status: DC

## 2017-06-21 NOTE — Patient Instructions (Addendum)
I am refilling the Belviq for 3 more months  Take it at 10 am and 3 pm (BEFORE YOU LEAVE HOME)   Ask Dr Jeralyn Ruths if you need a myelogram to determine if there is nerve irritation on the left   Return  in 3 months,  Fasting labs prior

## 2017-06-21 NOTE — Progress Notes (Signed)
Subjective:  Patient ID: Lisa Valencia, female    DOB: [birth year]  Age: 56 y.o. MRN: [id number]  CC: The primary encounter diagnosis was Impaired fasting glucose. Diagnoses of Pure hypercholesterolemia, DDD (degenerative disc disease), lumbar, Obesity (BMI 30-39.9), and Prediabetes were also pertinent to this visit.  HPI Lisa Valencia presents for follow u pon multiple issues including obesity with pharmacotherapy for weight loss With Belviq, tobacco abuse and backpain .   Has lost 8 lbs since September.  Goal was 14 lbs. Patient weighs herself at home and says her weight last week 273  .  Has not been taking it twice daily. Misses the second dose a lot since she  works 2nd shift.  Reviewed work and sleep scheduled: advised tking it at 10 am and 3 pm    Not exercising due to persistent low back pain with left sided radiculopathy.  Her back pain improved transiently  After her 2nd epidural steroid injection  By Jeralyn Ruths ; she had  6 weeks of pain relief Received it on Oct 26,  Pain returned Nov 29   [name] done [facility] for her .    Treated for bronchitis over thanksgiving with augmentin and cough syrup with phenergan in It .  Stopped smoking for a few days while sick    Outpatient Medications Prior to Visit  Medication Sig Dispense Refill  . albuterol (PROVENTIL HFA;VENTOLIN HFA) 108 (90 Base) MCG/ACT inhaler Inhale 2 puffs into the lungs every 6 (six) hours as needed for wheezing or shortness of breath. 1 Inhaler 0  . aspirin 81 MG tablet Take 81 mg by mouth daily.    . celecoxib (CELEBREX) 200 MG capsule Take 1 capsule (200 mg total) by mouth 2 (two) times daily. 180 capsule 1  . citalopram (CELEXA) 40 MG tablet TAKE ONE TABLET BY MOUTH DAILY 90 tablet 0  . conjugated estrogens (PREMARIN) vaginal cream Place one applicatorful vaginally two to three times weekly 42.5 g 12  . diclofenac sodium (VOLTAREN) 1 % GEL Apply 2 g topically 4 (four) times daily. 100 g 3  . fexofenadine  (ALLEGRA) 60 MG tablet Take by mouth.    . Omega-3 Fatty Acids (FISH OIL) 1000 MG CAPS Take 1 capsule by mouth daily.    . pantoprazole (PROTONIX) 40 MG tablet Take 1 tablet (40 mg total) by mouth 2 (two) times daily. 180 tablet 1  . simvastatin (ZOCOR) 20 MG tablet Take 1 tablet (20 mg total) by mouth at bedtime. 90 tablet 1  . spironolactone (ALDACTONE) 50 MG tablet Take 1 tablet (50 mg total) by mouth daily as needed. 90 tablet 1  . traMADol (ULTRAM) 50 MG tablet TAKE 1 TO 2 TABLETS EVERY 6 HOURS AS NEEDED FOR SEVERE BACK PAIN MAX OF 6 A DAY 180 tablet 5  . Lorcaserin HCl 10 MG TABS 10 mg twice daily (before breakfast and by 3 pm ) 60 tablet 2  . cetirizine (ZYRTEC) 10 MG tablet Take 10 mg by mouth daily.     No facility-administered medications prior to visit.     Review of Systems;  Patient denies headache, fevers, malaise, unintentional weight loss, skin rash, eye pain, sinus congestion and sinus pain, sore throat, dysphagia,  hemoptysis , cough, dyspnea, wheezing, chest pain, palpitations, orthopnea, edema, abdominal pain, nausea, melena, diarrhea, constipation, flank pain, dysuria, hematuria, urinary  Frequency, nocturia, numbness, tingling, seizures,  Focal weakness, Loss of consciousness,  Tremor, insomnia, depression, anxiety, and suicidal ideation.  Objective:  BP 110/70 (BP Location: Left Arm, Patient Position: Sitting, Cuff Size: Large)   Pulse 86   Temp (!) 97.4 F (36.3 C) (Oral)   Resp 15   Ht 5' 9.5" (1.765 m)   Wt 280 lb 3.2 oz (127.1 kg)   SpO2 95%   BMI 40.79 kg/m   BP Readings from Last 3 Encounters:  06/21/17 110/70  05/12/17 (!) 146/77  04/13/17 (!) 157/79    Wt Readings from Last 3 Encounters:  06/21/17 280 lb 3.2 oz (127.1 kg)  03/22/17 288 lb (130.6 kg)  10/11/16 288 lb 12.8 oz (131 kg)    General appearance: alert, cooperative and appears stated age Ears: normal TM's and external ear canals both ears Throat: lips, mucosa, and tongue normal;  teeth and gums normal Neck: no adenopathy, no carotid bruit, supple, symmetrical, trachea midline and thyroid not enlarged, symmetric, no tenderness/mass/nodules Back: symmetric, no curvature. ROM normal. No CVA tenderness. Lungs: clear to auscultation bilaterally Heart: regular rate and rhythm, S1, S2 normal, no murmur, click, rub or gallop Abdomen: soft, non-tender; bowel sounds normal; no masses,  no organomegaly Pulses: 2+ and symmetric Skin: Skin color, texture, turgor normal. No rashes or lesions Lymph nodes: Cervical, supraclavicular, and axillary nodes normal.  Lab Results  Component Value Date   HGBA1C 6.1 03/17/2017   HGBA1C 5.8 02/01/2016   HGBA1C 5.9 05/06/2015    Lab Results  Component Value Date   CREATININE 0.83 03/17/2017   CREATININE 0.86 11/17/2016   CREATININE 0.88 10/11/2016    Lab Results  Component Value Date   WBC 6.0 02/01/2016   HGB 14.3 02/01/2016   HCT 41.8 02/01/2016   PLT 187.0 02/01/2016   GLUCOSE 114 (H) 03/17/2017   CHOL 169 03/17/2017   TRIG 220.0 (H) 03/17/2017   HDL 42.80 03/17/2017   LDLDIRECT 95.0 03/17/2017   LDLCALC 160 (H) 05/17/2012   ALT 38 (H) 03/17/2017   AST 24 03/17/2017   NA 137 03/17/2017   K 4.3 03/17/2017   CL 99 03/17/2017   CREATININE 0.83 03/17/2017   BUN 13 03/17/2017   CO2 31 03/17/2017   TSH 0.76 02/01/2016   HGBA1C 6.1 03/17/2017   MICROALBUR 1.1 03/17/2017    Dg Inject Diag/thera/inc Needle/cath/plc Epi/lumb/sac W/img  Result Date: 05/12/2017 CLINICAL DATA:  Lumbosacral spondylosis without myelopathy. Left groin and left lower extremity pain. Moderate though relatively short-lived relief following the first epidural injection last month. Repeat injection requested at L3-4. FLUOROSCOPY TIME:  Radiation Exposure Index (as provided by the fluoroscopic device): 14.65 microGray*m^2 Fluoroscopy Time (in minutes and seconds):  7 seconds PROCEDURE: The procedure, risks, benefits, and alternatives were explained to  the patient. Questions regarding the procedure were encouraged and answered. The patient understands and consents to the procedure. LUMBAR EPIDURAL INJECTION: An interlaminar approach was performed on the left at L3-4. The overlying skin was cleansed and anesthetized. A 20 gauge epidural needle was advanced using loss-of-resistance technique. A 3.5 inch needle which used, which required a indenting the skin. DIAGNOSTIC EPIDURAL INJECTION: Injection of Isovue-M 200 shows a good epidural pattern with spread above and below the level of needle placement, primarily on the left. No vascular opacification is seen. THERAPEUTIC EPIDURAL INJECTION: 120 mg of Depo-Medrol mixed with 3 mL of 1% lidocaine were instilled. The procedure was well-tolerated, and the patient was discharged thirty minutes following the injection in good condition. COMPLICATIONS: None IMPRESSION: Technically successful lumbar interlaminar epidural injection on the left at L3-4. Electronically Signed   By:  Logan Bores M.D.   On: 05/12/2017 09:31    Assessment & Plan:   Problem List Items Addressed This Visit    DDD (degenerative disc disease), lumbar    Her pain is nearly  Disabling and certainly interfering with her ability to exercise,  Which complicates her efforts to lose weight .  Will renew belviq since losing weight will help her back issues.  Continue tramadol and celebrex       Hyperlipidemia, unspecified   Relevant Orders   Lipid panel   Obesity (BMI 30-39.9)    Weight loss is less than expected due to non adherence to daily regimen of bid dosing and lack of exercise. . A total of 25 minutes of face to face time was spent with patient more than half of which was spent in counselling about the above mentioned conditions  and coordination of care .  Refilled for 3 more months  Goal is 13 lbs       Relevant Medications   Lorcaserin HCl 10 MG TABS   Prediabetes - Primary    Her  random glucose is not  elevated but her A1c  suggests she is at risk for developing diabetes.  I recommend he follow a low glycemic index diet and particpate regularly in an aerobic  exercise activity.  We should check an A1c in 3 months.   Lab Results  Component Value Date   HGBA1C 6.1 03/17/2017            I have discontinued Juliann Pulse A. Steinberg's cetirizine. I am also having her maintain her aspirin, Fish Oil, conjugated estrogens, diclofenac sodium, traMADol, albuterol, celecoxib, spironolactone, simvastatin, pantoprazole, fexofenadine, citalopram, and Lorcaserin HCl.  Meds ordered this encounter  Medications  . Lorcaserin HCl 10 MG TABS    Sig: 10 mg twice daily (before breakfast and by 3 pm )    Dispense:  60 tablet    Refill:  2    Medications Discontinued During This Encounter  Medication Reason  . cetirizine (ZYRTEC) 10 MG tablet Patient has not taken in last 30 days  . Lorcaserin HCl 10 MG TABS Reorder    Follow-up: Return in about 3 months (around 09/19/2017).   Crecencio Mc, MD

## 2017-06-22 NOTE — Telephone Encounter (Signed)
Pt was in the office yesterday. Medication was refilled.

## 2017-06-23 ENCOUNTER — Encounter: Payer: Self-pay | Admitting: Internal Medicine

## 2017-06-23 NOTE — Assessment & Plan Note (Addendum)
Her pain is nearly  Disabling and certainly interfering with her ability to exercise,  Which complicates her efforts to lose weight .  Will renew belviq since losing weight will help her back issues.  Continue tramadol and celebrex

## 2017-06-23 NOTE — Assessment & Plan Note (Signed)
Weight loss is less than expected due to non adherence to daily regimen of bid dosing and lack of exercise. . A total of 25 minutes of face to face time was spent with patient more than half of which was spent in counselling about the above mentioned conditions  and coordination of care .  Refilled for 3 more months  Goal is 13 lbs

## 2017-06-23 NOTE — Assessment & Plan Note (Addendum)
Her  random glucose is not  elevated but her A1c suggests she is at risk for developing diabetes.  I recommend he follow a low glycemic index diet and particpate regularly in an aerobic  exercise activity.  We should check an A1c in 3 months.   Lab Results  Component Value Date   HGBA1C 6.1 03/17/2017

## 2017-07-06 ENCOUNTER — Other Ambulatory Visit: Payer: Self-pay | Admitting: Physician Assistant

## 2017-07-06 DIAGNOSIS — M5416 Radiculopathy, lumbar region: Secondary | ICD-10-CM

## 2017-07-24 ENCOUNTER — Other Ambulatory Visit: Payer: Self-pay | Admitting: Internal Medicine

## 2017-07-25 NOTE — Telephone Encounter (Signed)
Refilled: 06/21/2017 Last OV: 06/21/2017 Next OV: 09/25/2017

## 2017-07-28 NOTE — Telephone Encounter (Signed)
Printed, signed and faxed.  

## 2017-07-31 ENCOUNTER — Inpatient Hospital Stay
Admission: RE | Admit: 2017-07-31 | Discharge: 2017-07-31 | Disposition: A | Discharge status: Home / Self Care | Payer: Managed Care, Other (non HMO) | Source: Ambulatory Visit | Attending: Physician Assistant | Admitting: Physician Assistant

## 2017-09-11 ENCOUNTER — Other Ambulatory Visit: Payer: Self-pay | Admitting: Internal Medicine

## 2017-09-20 ENCOUNTER — Encounter: Payer: Self-pay | Admitting: Internal Medicine

## 2017-09-21 ENCOUNTER — Encounter: Payer: Self-pay | Admitting: Internal Medicine

## 2017-09-21 ENCOUNTER — Other Ambulatory Visit (INDEPENDENT_AMBULATORY_CARE_PROVIDER_SITE_OTHER): Payer: Managed Care, Other (non HMO)

## 2017-09-21 DIAGNOSIS — R7301 Impaired fasting glucose: Secondary | ICD-10-CM

## 2017-09-21 DIAGNOSIS — E7800 Pure hypercholesterolemia, unspecified: Secondary | ICD-10-CM | POA: Diagnosis not present

## 2017-09-21 LAB — COMPREHENSIVE METABOLIC PANEL
ALT: 22 U/L (ref 0–35)
AST: 16 U/L (ref 0–37)
Albumin: 4.2 g/dL (ref 3.5–5.2)
Alkaline Phosphatase: 42 U/L (ref 39–117)
BUN: 15 mg/dL (ref 6–23)
CO2: 31 mEq/L (ref 19–32)
Calcium: 9.9 mg/dL (ref 8.4–10.5)
Chloride: 103 mEq/L (ref 96–112)
Creatinine, Ser: 0.78 mg/dL (ref 0.40–1.20)
GFR: 80.99 mL/min (ref >60.00)
Glucose, Bld: 99 mg/dL (ref 70–99)
Potassium: 4.5 mEq/L (ref 3.5–5.1)
Sodium: 140 mEq/L (ref 135–145)
Total Bilirubin: 0.4 mg/dL (ref 0.2–1.2)
Total Protein: 7 g/dL (ref 6.0–8.3)

## 2017-09-21 LAB — HEMOGLOBIN A1C: Hgb A1c MFr Bld: 5.9 % (ref 4.6–6.5)

## 2017-09-21 LAB — LIPID PANEL
Cholesterol: 164 mg/dL (ref 0–200)
HDL: 39.5 mg/dL (ref >39.00)
NonHDL: 124.23
Total CHOL/HDL Ratio: 4
Triglycerides: 293 mg/dL — ABNORMAL HIGH (ref 0.0–149.0)
VLDL: 58.6 mg/dL — ABNORMAL HIGH (ref 0.0–40.0)

## 2017-09-21 LAB — LDL CHOLESTEROL, DIRECT: Direct LDL: 86 mg/dL

## 2017-09-25 ENCOUNTER — Ambulatory Visit: Payer: Managed Care, Other (non HMO) | Admitting: Internal Medicine

## 2017-10-27 ENCOUNTER — Other Ambulatory Visit: Payer: Self-pay | Admitting: Internal Medicine

## 2017-10-30 NOTE — Telephone Encounter (Signed)
Refilled: 03/22/2017 Last OV: 06/21/2017 Next OV: not scheduled

## 2017-10-31 NOTE — Telephone Encounter (Signed)
Printed, signed and faxed.  

## 2017-11-20 ENCOUNTER — Other Ambulatory Visit: Payer: Self-pay | Admitting: Internal Medicine

## 2017-11-20 MED ORDER — SPIRONOLACTONE 50 MG PO TABS: 50.0000 mg | ORAL_TABLET | Freq: Every day | ORAL | 1 refills | Status: DC

## 2017-11-20 MED ORDER — CITALOPRAM HYDROBROMIDE 40 MG PO TABS: 40.0000 mg | ORAL_TABLET | Freq: Every day | ORAL | 1 refills | Status: DC

## 2017-12-23 ENCOUNTER — Other Ambulatory Visit: Payer: Self-pay | Admitting: Internal Medicine

## 2017-12-25 ENCOUNTER — Other Ambulatory Visit: Payer: Self-pay | Admitting: Internal Medicine

## 2018-01-24 ENCOUNTER — Other Ambulatory Visit: Payer: Self-pay | Admitting: Internal Medicine

## 2018-01-26 NOTE — Telephone Encounter (Signed)
Patient is scheduled for 01/29/18

## 2018-01-26 NOTE — Telephone Encounter (Signed)
Please notify patient that the prescriptions will be  Refilled for 30 days only because tramadol is a controlled substance and require a  6 month follow up vist and  Controlled substance contract  and  it has been  7  months since her last visit. Marland Kitchen  OFFICE VISIT NEEDED and contract signed  prior to any more refills

## 2018-01-26 NOTE — Telephone Encounter (Signed)
Last OV 06/21/17 last filled 10/30/17 180 2rf

## 2018-01-29 ENCOUNTER — Ambulatory Visit (INDEPENDENT_AMBULATORY_CARE_PROVIDER_SITE_OTHER): Payer: Managed Care, Other (non HMO) | Admitting: Internal Medicine

## 2018-01-29 ENCOUNTER — Encounter: Payer: Self-pay | Admitting: Internal Medicine

## 2018-01-29 VITALS — BP 136/82 | HR 70 | Temp 98.1°F | Resp 15 | Ht 69.5 in | Wt 278.4 lb

## 2018-01-29 DIAGNOSIS — M1612 Unilateral primary osteoarthritis, left hip: Secondary | ICD-10-CM

## 2018-01-29 DIAGNOSIS — M5442 Lumbago with sciatica, left side: Secondary | ICD-10-CM

## 2018-01-29 DIAGNOSIS — M167 Other unilateral secondary osteoarthritis of hip: Secondary | ICD-10-CM

## 2018-01-29 DIAGNOSIS — E669 Obesity, unspecified: Secondary | ICD-10-CM

## 2018-01-29 DIAGNOSIS — K760 Fatty (change of) liver, not elsewhere classified: Secondary | ICD-10-CM | POA: Diagnosis not present

## 2018-01-29 DIAGNOSIS — R7303 Prediabetes: Secondary | ICD-10-CM

## 2018-01-29 DIAGNOSIS — M5136 Other intervertebral disc degeneration, lumbar region: Secondary | ICD-10-CM | POA: Diagnosis not present

## 2018-01-29 DIAGNOSIS — G8929 Other chronic pain: Secondary | ICD-10-CM

## 2018-01-29 LAB — COMPREHENSIVE METABOLIC PANEL
ALT: 34 U/L (ref 0–35)
AST: 21 U/L (ref 0–37)
Albumin: 4.5 g/dL (ref 3.5–5.2)
Alkaline Phosphatase: 41 U/L (ref 39–117)
BUN: 15 mg/dL (ref 6–23)
CO2: 26 mEq/L (ref 19–32)
Calcium: 9.8 mg/dL (ref 8.4–10.5)
Chloride: 103 mEq/L (ref 96–112)
Creatinine, Ser: 0.81 mg/dL (ref 0.40–1.20)
GFR: 77.44 mL/min (ref >60.00)
Glucose, Bld: 105 mg/dL — ABNORMAL HIGH (ref 70–99)
Potassium: 4.2 mEq/L (ref 3.5–5.1)
Sodium: 139 mEq/L (ref 135–145)
Total Bilirubin: 0.4 mg/dL (ref 0.2–1.2)
Total Protein: 7.4 g/dL (ref 6.0–8.3)

## 2018-01-29 MED ORDER — HYDROCODONE-ACETAMINOPHEN 10-325 MG PO TABS: 1.0000 | ORAL_TABLET | Freq: Four times a day (QID) | ORAL | 0 refills | Status: DC | PRN

## 2018-01-29 NOTE — Patient Instructions (Addendum)
  The new goals for optimal blood pressure management are 120/70.  Please check your blood pressure a few times at home and send me the readings so I can determine if you need a change in medication    I am prescribing Vicodin to use up to 2 times daily along with your tramadol. I will continue to prescribe both for you while we decide if your sciatica merits another neurosurgical evaluation  Neurology referral for EMG/nerve conduction studies in progress  Orthopedic  referral to [facility]

## 2018-01-29 NOTE — Progress Notes (Signed)
Subjective:  Patient ID: Lisa Valencia, female    DOB: Nov 06, 1960  Age: 57 y.o. MRN: [id number]  CC: The primary encounter diagnosis was Primary osteoarthritis of left hip. Diagnoses of Chronic left-sided low back pain with left-sided sciatica, Hepatic steatosis, DDD (degenerative disc disease), lumbar, Prediabetes, Obesity (BMI 30-39.9), and Other secondary osteoarthritis of left hip were also pertinent to this visit.  HPI Savannaha Stonerock presents for  Follow up on IPG<  Obesity, fatty liver. Last seen dec 2018   Back pain:/hip pain:  Saw Ortho July 2 . Hip replacement left side recommended . Most of her pain however was attributed to back issues and told would not resolve with THR. Pain is shooting down to ankle .  MRI done last year.  ESI,  Oral steroids  eval by N/s Lacinda Axon   "nothing surgical I can do"  Since MRI showed  right bulging disk but pain is on the left.  Has been unable to schedule an appt with Dr Marry Guan for hip surgery despte multipel calls . Frustrated, bc she is about to lose her insurance due to job termination in May for patient care error in morphine administration.    .  Currently on COBRA    She cannot sit for more than 15 minutes before pain in left side becomes significant .     Outpatient Medications Prior to Visit  Medication Sig Dispense Refill  . aspirin 81 MG tablet Take 81 mg by mouth daily.    . celecoxib (CELEBREX) 200 MG capsule TAKE 1 CAPSULE BY MOUTH TWICE DAILY 180 capsule 1  . citalopram (CELEXA) 40 MG tablet Take 1 tablet (40 mg total) by mouth daily. 90 tablet 1  . conjugated estrogens (PREMARIN) vaginal cream Place one applicatorful vaginally two to three times weekly 42.5 g 12  . diclofenac sodium (VOLTAREN) 1 % GEL Apply 2 g topically 4 (four) times daily. 100 g 3  . fexofenadine (ALLEGRA) 60 MG tablet Take by mouth.    . Omega-3 Fatty Acids (FISH OIL) 1000 MG CAPS Take 1 capsule by mouth daily.    . pantoprazole (PROTONIX) 40 MG tablet TAKE  ONE TABLET BY MOUTH TWICE DAILY 180 tablet 1  . simvastatin (ZOCOR) 20 MG tablet TAKE ONE TABLET BY MOUTH AT BEDTIME 90 tablet 1  . spironolactone (ALDACTONE) 50 MG tablet Take 1 tablet (50 mg total) by mouth daily. 90 tablet 1  . traMADol (ULTRAM) 50 MG tablet TAKE 1-2 TABLETS BY MOUTH EVERY 6 HOURS AS NEEDED FOR SEVERE BACK PAIN. MAX OF 6PER DAY 180 tablet 0  . albuterol (PROVENTIL HFA;VENTOLIN HFA) 108 (90 Base) MCG/ACT inhaler Inhale 2 puffs into the lungs every 6 (six) hours as needed for wheezing or shortness of breath. 1 Inhaler 0  . BELVIQ 10 MG TABS TAKE 1 TABLET BY MOUTH TWICE DAILY (BEFORE BREAKFAST AND BY 3PM) 60 tablet 1  . simvastatin (ZOCOR) 20 MG tablet TAKE ONE TABLET BY MOUTH AT BEDTIME 90 tablet 1   No facility-administered medications prior to visit.     Review of Systems;  Patient denies headache, fevers, malaise, unintentional weight loss, skin rash, eye pain, sinus congestion and sinus pain, sore throat, dysphagia,  hemoptysis , cough, dyspnea, wheezing, chest pain, palpitations, orthopnea, edema, abdominal pain, nausea, melena, diarrhea, constipation, flank pain, dysuria, hematuria, urinary  Frequency, nocturia, numbness, tingling, seizures,  Focal weakness, Loss of consciousness,  Tremor, insomnia, depression, anxiety, and suicidal ideation.      Objective:  BP  136/82 (BP Location: Left Arm, Patient Position: Sitting, Cuff Size: Normal)   Pulse 70   Temp 98.1 F (36.7 C) (Oral)   Resp 15   Ht 5' 9.5" (1.765 m)   Wt 278 lb 6.4 oz (126.3 kg)   SpO2 96%   BMI 40.52 kg/m   BP Readings from Last 3 Encounters:  01/29/18 136/82  06/21/17 110/70  05/12/17 (!) 146/77    Wt Readings from Last 3 Encounters:  01/29/18 278 lb 6.4 oz (126.3 kg)  06/21/17 280 lb 3.2 oz (127.1 kg)  03/22/17 288 lb (130.6 kg)    General appearance: alert, cooperative and appears stated age Ears: normal TM's and external ear canals both ears Throat: lips, mucosa, and tongue normal;  teeth and gums normal Neck: no adenopathy, no carotid bruit, supple, symmetrical, trachea midline and thyroid not enlarged, symmetric, no tenderness/mass/nodules Back: symmetric, no curvature. ROM normal. No CVA tenderness. Lungs: clear to auscultation bilaterally Heart: regular rate and rhythm, S1, S2 normal, no murmur, click, rub or gallop Abdomen: soft, non-tender; bowel sounds normal; no masses,  no organomegaly Pulses: 2+ and symmetric Skin: Skin color, texture, turgor normal. No rashes or lesions Lymph nodes: Cervical, supraclavicular, and axillary nodes normal.  Lab Results  Component Value Date   HGBA1C 5.9 09/21/2017   HGBA1C 6.1 03/17/2017   HGBA1C 5.8 02/01/2016    Lab Results  Component Value Date   CREATININE 0.81 01/29/2018   CREATININE 0.78 09/21/2017   CREATININE 0.83 03/17/2017    Lab Results  Component Value Date   WBC 6.0 02/01/2016   HGB 14.3 02/01/2016   HCT 41.8 02/01/2016   PLT 187.0 02/01/2016   GLUCOSE 105 (H) 01/29/2018   CHOL 164 09/21/2017   TRIG 293.0 (H) 09/21/2017   HDL 39.50 09/21/2017   LDLDIRECT 86.0 09/21/2017   LDLCALC 160 (H) 05/17/2012   ALT 34 01/29/2018   AST 21 01/29/2018   NA 139 01/29/2018   K 4.2 01/29/2018   CL 103 01/29/2018   CREATININE 0.81 01/29/2018   BUN 15 01/29/2018   CO2 26 01/29/2018   TSH 0.76 02/01/2016   HGBA1C 5.9 09/21/2017   MICROALBUR 1.1 03/17/2017    No results found.  Assessment & Plan:   Problem List Items Addressed This Visit    DDD (degenerative disc disease), lumbar    MRI may not be showing the full extent f her condition,  EMG/nerve conduction studies recommended; referral to Neurology advised,  Taking tramadol at maximal amount due to concurrent use of celexa.  Adding Vicodin for use twice daily .  Will need narcotics contract if not already done. Continue celebrex      Relevant Medications   HYDROcodone-acetaminophen (NORCO) 10-325 MG tablet   Degenerative joint disease (DJD) of hip -  Primary    Referral to Dr Marry Guan in progress for left THR . Continue celebrex, tramadol vicodin added today for concurrent sciatica       Relevant Medications   HYDROcodone-acetaminophen (NORCO) 10-325 MG tablet   Other Relevant Orders   Ambulatory referral to Orthopedic Surgery   Hepatic steatosis   Relevant Orders   Comprehensive metabolic panel (Completed)   Obesity (BMI 30-39.9)    Weight loss was less than expected due to non adherence to daily regimen of bid dosing and lack of exercise.  She is no longer taking med due to cost. . A total of 25 minutes of face to face time was spent with patient more than half of which was  spent in counselling about the above mentioned conditions  and coordination of care .       Prediabetes    Her  random glucose was  not  Elevated in March  but her A1c suggests she is at risk for developing diabetes.  I recommend he follow a low glycemic index diet and particpate regularly in an aerobic  exercise activity.  We should check an A1c in September   Lab Results  Component Value Date   HGBA1C 5.9 09/21/2017          Other Visit Diagnoses    Chronic left-sided low back pain with left-sided sciatica       Relevant Medications   HYDROcodone-acetaminophen (NORCO) 10-325 MG tablet   Other Relevant Orders   Ambulatory referral to Neurology      I have discontinued Juliann Pulse A. Coello's albuterol and BELVIQ. I am also having her start on HYDROcodone-acetaminophen. Additionally, I am having her maintain her aspirin, Fish Oil, conjugated estrogens, diclofenac sodium, fexofenadine, celecoxib, pantoprazole, citalopram, spironolactone, simvastatin, and traMADol.  Meds ordered this encounter  Medications  . HYDROcodone-acetaminophen (NORCO) 10-325 MG tablet    Sig: Take 1 tablet by mouth every 6 (six) hours as needed.    Dispense:  30 tablet    Refill:  0    Medications Discontinued During This Encounter  Medication Reason  . albuterol (PROVENTIL  HFA;VENTOLIN HFA) 108 (90 Base) MCG/ACT inhaler   . BELVIQ 10 MG TABS   . simvastatin (ZOCOR) 20 MG tablet     Follow-up: Return in about 2 weeks (around 02/12/2018) for pain management .   Crecencio Mc, MD

## 2018-01-30 DIAGNOSIS — M1612 Unilateral primary osteoarthritis, left hip: Secondary | ICD-10-CM | POA: Insufficient documentation

## 2018-01-30 NOTE — Assessment & Plan Note (Signed)
Referral to Dr Marry Guan in progress for left THR . Continue celebrex, tramadol vicodin added today for concurrent sciatica

## 2018-01-30 NOTE — Assessment & Plan Note (Addendum)
MRI may not be showing the full extent f her condition,  EMG/nerve conduction studies recommended; referral to Neurology advised,  Taking tramadol at maximal amount due to concurrent use of celexa.  Adding Vicodin for use twice daily .  Will need narcotics contract if not already done. Continue celebrex

## 2018-01-30 NOTE — Assessment & Plan Note (Signed)
Her  random glucose was  not  Elevated in March  but her A1c suggests she is at risk for developing diabetes.  I recommend he follow a low glycemic index diet and particpate regularly in an aerobic  exercise activity.  We should check an A1c in September   Lab Results  Component Value Date   HGBA1C 5.9 09/21/2017

## 2018-01-30 NOTE — Assessment & Plan Note (Signed)
Weight loss was less than expected due to non adherence to daily regimen of bid dosing and lack of exercise.  She is no longer taking med due to cost. . A total of 25 minutes of face to face time was spent with patient more than half of which was spent in counselling about the above mentioned conditions  and coordination of care .

## 2018-02-09 ENCOUNTER — Other Ambulatory Visit: Payer: Self-pay | Admitting: Internal Medicine

## 2018-02-12 ENCOUNTER — Other Ambulatory Visit: Payer: Self-pay | Admitting: Internal Medicine

## 2018-02-12 ENCOUNTER — Encounter: Payer: Self-pay | Admitting: Internal Medicine

## 2018-02-12 MED ORDER — HYDROCODONE-ACETAMINOPHEN 10-325 MG PO TABS: 1.0000 | ORAL_TABLET | Freq: Two times a day (BID) | ORAL | 0 refills | Status: DC | PRN

## 2018-02-12 NOTE — Telephone Encounter (Signed)
rx request Patient was last seen on 01-29-18 Filled on 01-26-18  To early for refills FYI

## 2018-02-12 NOTE — Telephone Encounter (Signed)
Patient needs to sign a controlled substance contract for tramadol [place] vicodin   The tramadol was filled for #6/day (#180/month) on July 12 and cannot be refilled until august 12   The vicodin can be taken a max of twice daily  rx written

## 2018-02-12 NOTE — Telephone Encounter (Signed)
Saw that mychart message had not been read yet so called pt and informed her of the message. Pt stated that she thinks she is going to need the hydrocodone at least until she has her second hip replacement. Would it be ok to go ahead and do a PA for a 30 day supply.

## 2018-02-12 NOTE — Telephone Encounter (Signed)
Refilled: 01/29/2018   Last OV: 01/29/2018 Next OV: 05/07/2018

## 2018-02-13 ENCOUNTER — Telehealth: Payer: Self-pay | Admitting: Internal Medicine

## 2018-02-13 NOTE — Telephone Encounter (Signed)
Pt is aware. Pt stated that she filled out a controlled substance agreement at her last appt for both the tramadol and the hydrocodone.

## 2018-02-13 NOTE — Telephone Encounter (Signed)
The controlled substance contract is not clearly accessible in  Chart.  Where are we supposed to be able to find it?  Looking under "Media."

## 2018-02-13 NOTE — Telephone Encounter (Signed)
Yes, it should have printed.

## 2018-02-13 NOTE — Telephone Encounter (Signed)
Showed Dr. Derrel Nip where these can be found.

## 2018-02-19 ENCOUNTER — Other Ambulatory Visit: Payer: Self-pay | Admitting: Internal Medicine

## 2018-02-21 NOTE — Telephone Encounter (Signed)
Refilled: 01/26/2018 Last OV: 01/29/2018 Next OV: 05/07/2018

## 2018-02-21 NOTE — Telephone Encounter (Signed)
Refilled tramadol.

## 2018-02-22 ENCOUNTER — Other Ambulatory Visit: Payer: Self-pay

## 2018-02-23 NOTE — Telephone Encounter (Signed)
Spoke with pt to see if she still had her insurance because we are having trouble with the PA. Pt stated that she does still have insurance, so I told pt that I would reach out to her insurance company and figure out what is going on. Pt stated that would be fine because she still has 6 or 7 days worth of medication.

## 2018-02-23 NOTE — Telephone Encounter (Signed)
rx request 

## 2018-02-27 ENCOUNTER — Telehealth: Payer: Self-pay | Admitting: Internal Medicine

## 2018-02-27 MED ORDER — HYDROCODONE-ACETAMINOPHEN 10-325 MG PO TABS: 1.0000 | ORAL_TABLET | Freq: Two times a day (BID) | ORAL | 0 refills | Status: DC | PRN

## 2018-02-27 NOTE — Telephone Encounter (Signed)
Spoke with [employer] and they stated that a PA is not required for the Hydrocodone 10-325mg  #60. Can this be sent electronically to [facility] the original rx has been ripped up and put in the shred bin.

## 2018-02-27 NOTE — Telephone Encounter (Signed)
Yes, I have sent the vicodin refill for #60 via [employer]

## 2018-03-20 ENCOUNTER — Encounter: Payer: Managed Care, Other (non HMO) | Admitting: Internal Medicine

## 2018-04-09 ENCOUNTER — Other Ambulatory Visit: Payer: Self-pay | Admitting: Internal Medicine

## 2018-04-10 ENCOUNTER — Other Ambulatory Visit: Payer: Self-pay

## 2018-04-10 MED ORDER — HYDROCODONE-ACETAMINOPHEN 10-325 MG PO TABS: 1.0000 | ORAL_TABLET | Freq: Two times a day (BID) | ORAL | 0 refills | Status: DC | PRN

## 2018-04-10 NOTE — Telephone Encounter (Signed)
Refilled: 02/27/2018 Last OV: 01/29/2018 Next OV: 07/23/2018  Pt will be having surgery in October.

## 2018-04-10 NOTE — Telephone Encounter (Signed)
Refilled and sent   

## 2018-04-15 NOTE — Discharge Instructions (Signed)
Instructions after Total Hip Replacement ° ° °  Yovanny Coats P. Charly Holcomb, Jr., M.D.    ° Dept. of Orthopaedics & Sports Medicine ° [facility] ° [address] ° [place], [place]  [address] ° Phone: [phone]   Fax: [phone] ° °  °DIET: °• Drink plenty of non-alcoholic fluids. °• Resume your normal diet. Include foods high in fiber. ° °ACTIVITY:  °• You may use crutches or a walker with weight-bearing as tolerated, unless instructed otherwise. °• You may be weaned off of the walker or crutches by your Physical Therapist.  °• Do NOT reach below the level of your knees or cross your legs until allowed.    °• Continue doing gentle exercises. Exercising will reduce the pain and swelling, increase motion, and prevent muscle weakness.   °• Please continue to use the TED compression stockings for 6 weeks. You may remove the stockings at night, but should reapply them in the morning. °• Do not drive or operate any equipment until instructed. ° °WOUND CARE:  °• Continue to use ice packs periodically to reduce pain and swelling. °• Keep the incision clean and dry. °• You may bathe or shower after the staples are removed at the first office visit following surgery. ° °MEDICATIONS: °• You may resume your regular medications. °• Please take the pain medication as prescribed on the medication. °• Do not take pain medication on an empty stomach. °• You have been given a prescription for a blood thinner to prevent blood clots. Please take the medication as instructed. (NOTE: After completing a 2 week course of Lovenox, take one Enteric-coated aspirin once a day.) °• Pain medications and iron supplements can cause constipation. Use a stool softener (Senokot or Colace) on a daily basis and a laxative (dulcolax or miralax) as needed. °• Do not drive or drink alcoholic beverages when taking pain medications. ° °CALL THE OFFICE FOR: °• Temperature above 101 degrees °• Excessive bleeding or drainage on the dressing. °• Excessive  swelling, coldness, or paleness of the toes. °• Persistent nausea and vomiting. ° °FOLLOW-UP:  °• You should have an appointment to return to the office in 6 weeks after surgery. °• Arrangements have been made for continuation of Physical Therapy (either home therapy or outpatient therapy). °  °

## 2018-04-19 ENCOUNTER — Encounter
Admission: RE | Admit: 2018-04-19 | Discharge: 2018-04-19 | Disposition: A | Discharge status: Home / Self Care | Payer: Managed Care, Other (non HMO) | Source: Ambulatory Visit | Attending: Orthopedic Surgery | Admitting: Orthopedic Surgery

## 2018-04-19 ENCOUNTER — Other Ambulatory Visit: Payer: Self-pay

## 2018-04-19 DIAGNOSIS — Z01818 Encounter for other preprocedural examination: Secondary | ICD-10-CM | POA: Diagnosis not present

## 2018-04-19 HISTORY — DX: Major depressive disorder, single episode, unspecified: F32.9

## 2018-04-19 HISTORY — DX: Anxiety disorder, unspecified: F41.9

## 2018-04-19 HISTORY — DX: Depression, unspecified: F32.A

## 2018-04-19 HISTORY — DX: Unspecified osteoarthritis, unspecified site: M19.90

## 2018-04-19 LAB — COMPREHENSIVE METABOLIC PANEL
ALT: 47 U/L — ABNORMAL HIGH (ref 0–44)
AST: 31 U/L (ref 15–41)
Albumin: 4.3 g/dL (ref 3.5–5.0)
Alkaline Phosphatase: 42 U/L (ref 38–126)
Anion gap: 13 (ref 5–15)
BUN: 14 mg/dL (ref 6–20)
CO2: 27 mmol/L (ref 22–32)
Calcium: 9.6 mg/dL (ref 8.9–10.3)
Chloride: 101 mmol/L (ref 98–111)
Creatinine, Ser: 0.87 mg/dL (ref 0.44–1.00)
GFR calc Af Amer: >60 mL/min (ref >60)
GFR calc non Af Amer: >60 mL/min (ref >60)
Glucose, Bld: 99 mg/dL (ref 70–99)
Potassium: 4.4 mmol/L (ref 3.5–5.1)
Sodium: 141 mmol/L (ref 135–145)
Total Bilirubin: 0.6 mg/dL (ref 0.3–1.2)
Total Protein: 7.7 g/dL (ref 6.5–8.1)

## 2018-04-19 LAB — CBC WITH DIFFERENTIAL/PLATELET
Basophils Absolute: 0 10*3/uL (ref 0–0.1)
Basophils Relative: 1 %
Eosinophils Absolute: 0.2 10*3/uL (ref 0–0.7)
Eosinophils Relative: 3 %
HCT: 44.3 % (ref 35.0–47.0)
Hemoglobin: 15.6 g/dL (ref 12.0–16.0)
Lymphocytes Relative: 27 %
Lymphs Abs: 1.9 10*3/uL (ref 1.0–3.6)
MCH: 30.4 pg (ref 26.0–34.0)
MCHC: 35.3 g/dL (ref 32.0–36.0)
MCV: 86 fL (ref 80.0–100.0)
Monocytes Absolute: 0.4 10*3/uL (ref 0.2–0.9)
Monocytes Relative: 6 %
Neutro Abs: 4.4 10*3/uL (ref 1.4–6.5)
Neutrophils Relative %: 63 %
Platelets: 194 10*3/uL (ref 150–440)
RBC: 5.15 MIL/uL (ref 3.80–5.20)
RDW: 13.8 % (ref 11.5–14.5)
WBC: 7 10*3/uL (ref 3.6–11.0)

## 2018-04-19 LAB — URINALYSIS, ROUTINE W REFLEX MICROSCOPIC
Bilirubin Urine: NEGATIVE
Glucose, UA: NEGATIVE mg/dL
Hgb urine dipstick: NEGATIVE
Ketones, ur: NEGATIVE mg/dL
Leukocytes, UA: NEGATIVE
Nitrite: NEGATIVE
Protein, ur: NEGATIVE mg/dL
Specific Gravity, Urine: 1.012 (ref 1.005–1.030)
pH: 7 (ref 5.0–8.0)

## 2018-04-19 LAB — TYPE AND SCREEN
ABO/RH(D): B POS
Antibody Screen: NEGATIVE

## 2018-04-19 LAB — PROTIME-INR
INR: 0.9
Prothrombin Time: 12.1 seconds (ref 11.4–15.2)

## 2018-04-19 LAB — SEDIMENTATION RATE: Sed Rate: 4 mm/hr (ref 0–30)

## 2018-04-19 LAB — SURGICAL PCR SCREEN
MRSA, PCR: NEGATIVE
Staphylococcus aureus: NEGATIVE

## 2018-04-19 LAB — APTT: aPTT: 32 seconds (ref 24–36)

## 2018-04-19 NOTE — Patient Instructions (Signed)
Your procedure is scheduled on: Wednesday , May 02, 2018  Report to [facility]   DO NOT STOP ON THE FIRST FLOOR TO REGISTER  To find out your arrival time please call [phone] between 1PM - 3PM on Tuesday, October 15th  Remember: Instructions that are not followed completely may result in serious medical risk,  up to and including death, or upon the discretion of your surgeon and anesthesiologist your  surgery may need to be rescheduled.     _X__ 1. Do not eat food after midnight the night before your procedure.                 No gum chewing or hard candies.                   ABSOLUTELY NOTHING SOLID IN YOUR MOUTH AFTER MIDNIGHT TUESDAY                  You may drink clear liquids up to 2 hours before you are scheduled to arrive for your surgery-                   DO not drink clear liquids within 2 hours of the start of your surgery.                  Clear Liquids include:  water, apple juice without pulp, clear carbohydrate                 drink such as Clearfast of Gatorade, Black Coffee or Tea (Do not add                 anything to coffee or tea, EXCEPT SUGARS.  NO DAIRY PRODUCTS / HONEY / LEMON  __X__2.  On the morning of surgery brush your teeth with toothpaste and water,                  You may rinse your mouth with mouthwash if you wish.                     Do not swallow any toothpaste of mouthwash.     _X__ 3.  No Alcohol for 24 hours before or after surgery.   _X__ 4.  Do Not Smoke or use e-cigarettes For 24 Hours Prior to Your Surgery.                 Do not use any chewable tobacco products for at least 6 hours prior to                 surgery.  ____  5.  Bring all medications with you on the day of surgery if instructed.   ____  6.  Notify your doctor if there is any change in your medical condition      (cold, fever, infections).     Do not wear jewelry, make-up, hairpins, clips or nail polish. Do not  wear lotions, powders, or perfumes. You may wear deodorant. Do not shave 48 hours prior to surgery. Men may shave face and neck. Do not bring valuables to the hospital.    [facility] is not responsible for any belongings or valuables.  Contacts, dentures or bridgework may not be worn into surgery. Leave your suitcase in the car. After surgery it may be brought to your room. For patients admitted to the hospital, discharge time is determined by your treatment  team.   Patients discharged the day of surgery will not be allowed to drive home.   Please read over the following fact sheets that you were given:   PREPARING FOR SURGERY   __X__ Take these medicines the morning of surgery with A SIP OF WATER:    1. CELEBREX  2. PROTONIX  3. CELEXA  4. CLARITIN  5. TRAMADOL OR NORCO IF NEEDED, BUT NOT BOTH  6.  ____ Fleet Enema (as directed)   __X__ Use CHG Soap as directed  ____ Use inhalers on the day of surgery  __X__ Stop ALL ASPIRIN PRODUCTS AS OF April 25, 2018  _X___ Stop Anti-inflammatories AS OF April 25, 2018   _X___ Stop supplements AS OF April 25, 2018.             THIS INCLUDES FISH OIL    ____ Bring C-Pap to the hospital.   HAVE STOOL SOFTENERS AVAILABLE FOR USE ONCE HOME  TAKE MIRALAX AT HOME STARTING A DAY OR TWO BEFORE SURGERY  IF YOU HAVE COMPLETED YOUR ADVANCE DIRECTIVES, PLEASE BRING WITH YOU        SO WE MAY MAKE A COPY FOR YOUR CHART.

## 2018-04-19 NOTE — Pre-Procedure Instructions (Signed)
Patient has stated desire to have a Nicotene patch or oral medication to help her stop smoking, especially once she is inpatient for upcoming surgery

## 2018-04-20 LAB — C-REACTIVE PROTEIN: CRP: 0.9 mg/dL (ref <1.0)

## 2018-04-21 LAB — URINE CULTURE
Culture: >=100000 — AB
Special Requests: NORMAL

## 2018-04-21 LAB — HEMOGLOBIN A1C
Hgb A1c MFr Bld: 5.8 % — ABNORMAL HIGH (ref 4.8–5.6)
Mean Plasma Glucose: 120 mg/dL

## 2018-04-23 NOTE — Pre-Procedure Instructions (Signed)
UC FAXED TO DR [name] 

## 2018-04-25 ENCOUNTER — Ambulatory Visit: Payer: Managed Care, Other (non HMO) | Admitting: Family Medicine

## 2018-05-01 MED ORDER — CEFAZOLIN SODIUM-DEXTROSE 2-4 GM/100ML-% IV SOLN: 2.0000 g | INTRAVENOUS | Status: DC

## 2018-05-01 MED ORDER — TRANEXAMIC ACID-NACL 1000-0.7 MG/100ML-% IV SOLN
1000.0000 mg | INTRAVENOUS | Status: DC
  Filled 2018-05-01: qty 100

## 2018-05-02 ENCOUNTER — Encounter
Admission: RE | Disposition: A | Discharge status: Skilled Nursing Facility | Payer: Self-pay | Source: Home / Self Care | Attending: Orthopedic Surgery

## 2018-05-02 ENCOUNTER — Encounter: Payer: Self-pay | Admitting: Orthopedic Surgery

## 2018-05-02 ENCOUNTER — Inpatient Hospital Stay: Payer: Managed Care, Other (non HMO)

## 2018-05-02 ENCOUNTER — Inpatient Hospital Stay
Admission: RE | Admit: 2018-05-02 | Discharge: 2018-05-04 | DRG: 470 | Disposition: A | Discharge status: Skilled Nursing Facility | Payer: Managed Care, Other (non HMO) | Attending: Orthopedic Surgery | Admitting: Orthopedic Surgery

## 2018-05-02 ENCOUNTER — Other Ambulatory Visit: Payer: Self-pay

## 2018-05-02 DIAGNOSIS — M1612 Unilateral primary osteoarthritis, left hip: Principal | ICD-10-CM | POA: Diagnosis present

## 2018-05-02 DIAGNOSIS — K219 Gastro-esophageal reflux disease without esophagitis: Secondary | ICD-10-CM | POA: Diagnosis present

## 2018-05-02 DIAGNOSIS — Z23 Encounter for immunization: Secondary | ICD-10-CM | POA: Diagnosis not present

## 2018-05-02 DIAGNOSIS — F419 Anxiety disorder, unspecified: Secondary | ICD-10-CM | POA: Diagnosis present

## 2018-05-02 DIAGNOSIS — Z79899 Other long term (current) drug therapy: Secondary | ICD-10-CM | POA: Diagnosis not present

## 2018-05-02 DIAGNOSIS — Z7982 Long term (current) use of aspirin: Secondary | ICD-10-CM | POA: Diagnosis not present

## 2018-05-02 DIAGNOSIS — Z888 Allergy status to other drugs, medicaments and biological substances status: Secondary | ICD-10-CM | POA: Diagnosis not present

## 2018-05-02 DIAGNOSIS — Z882 Allergy status to sulfonamides status: Secondary | ICD-10-CM | POA: Diagnosis not present

## 2018-05-02 DIAGNOSIS — Z885 Allergy status to narcotic agent status: Secondary | ICD-10-CM | POA: Diagnosis not present

## 2018-05-02 DIAGNOSIS — F329 Major depressive disorder, single episode, unspecified: Secondary | ICD-10-CM | POA: Diagnosis present

## 2018-05-02 DIAGNOSIS — F17210 Nicotine dependence, cigarettes, uncomplicated: Secondary | ICD-10-CM | POA: Diagnosis present

## 2018-05-02 DIAGNOSIS — E669 Obesity, unspecified: Secondary | ICD-10-CM | POA: Diagnosis present

## 2018-05-02 DIAGNOSIS — Z96649 Presence of unspecified artificial hip joint: Secondary | ICD-10-CM

## 2018-05-02 DIAGNOSIS — Z6841 Body Mass Index (BMI) 40.0 and over, adult: Secondary | ICD-10-CM

## 2018-05-02 DIAGNOSIS — M25552 Pain in left hip: Secondary | ICD-10-CM | POA: Diagnosis present

## 2018-05-02 HISTORY — PX: TOTAL HIP ARTHROPLASTY: SHX124

## 2018-05-02 LAB — ABO/RH: ABO/RH(D): B POS

## 2018-05-02 SURGERY — ARTHROPLASTY, HIP, TOTAL,POSTERIOR APPROACH
Anesthesia: General | Laterality: Left

## 2018-05-02 MED ORDER — MENTHOL 3 MG MT LOZG
1.0000 | LOZENGE | OROMUCOSAL | Status: DC | PRN
  Filled 2018-05-02: qty 9

## 2018-05-02 MED ORDER — POLYETHYLENE GLYCOL 3350 17 G PO PACK
17.0000 g | PACK | Freq: Every day | ORAL | Status: DC
  Administered 2018-05-03: 17 g via ORAL
  Filled 2018-05-02: qty 1

## 2018-05-02 MED ORDER — ONDANSETRON HCL 4 MG PO TABS: 4.0000 mg | ORAL_TABLET | Freq: Four times a day (QID) | ORAL | Status: DC | PRN

## 2018-05-02 MED ORDER — LORATADINE 10 MG PO TABS
10.0000 mg | ORAL_TABLET | Freq: Every day | ORAL | Status: DC
  Administered 2018-05-03 – 2018-05-04 (×2): 10 mg via ORAL
  Filled 2018-05-02 (×2): qty 1

## 2018-05-02 MED ORDER — FERROUS SULFATE 325 (65 FE) MG PO TABS
325.0000 mg | ORAL_TABLET | Freq: Two times a day (BID) | ORAL | Status: DC
  Administered 2018-05-03 – 2018-05-04 (×2): 325 mg via ORAL
  Filled 2018-05-02 (×2): qty 1

## 2018-05-02 MED ORDER — DIPHENHYDRAMINE HCL 12.5 MG/5ML PO ELIX: 12.5000 mg | ORAL_SOLUTION | ORAL | Status: DC | PRN

## 2018-05-02 MED ORDER — SUCCINYLCHOLINE CHLORIDE 20 MG/ML IJ SOLN
INTRAMUSCULAR | Status: DC | PRN
  Administered 2018-05-02: 100 mg via INTRAVENOUS

## 2018-05-02 MED ORDER — SENNOSIDES-DOCUSATE SODIUM 8.6-50 MG PO TABS
1.0000 | ORAL_TABLET | Freq: Two times a day (BID) | ORAL | Status: DC
  Administered 2018-05-02 – 2018-05-04 (×4): 1 via ORAL
  Filled 2018-05-02 (×4): qty 1

## 2018-05-02 MED ORDER — CELECOXIB 200 MG PO CAPS
400.0000 mg | ORAL_CAPSULE | Freq: Once | ORAL | Status: AC
  Administered 2018-05-02: 200 mg via ORAL

## 2018-05-02 MED ORDER — TRAMADOL HCL 50 MG PO TABS
50.0000 mg | ORAL_TABLET | ORAL | Status: DC | PRN
  Administered 2018-05-02 – 2018-05-03 (×2): 100 mg via ORAL
  Administered 2018-05-04: 50 mg via ORAL
  Filled 2018-05-02 (×2): qty 1
  Filled 2018-05-02 (×2): qty 2

## 2018-05-02 MED ORDER — GLYCOPYRROLATE 0.2 MG/ML IJ SOLN
INTRAMUSCULAR | Status: DC | PRN
  Administered 2018-05-02: 0.2 mg via INTRAVENOUS

## 2018-05-02 MED ORDER — METOCLOPRAMIDE HCL 10 MG PO TABS: 5.0000 mg | ORAL_TABLET | Freq: Three times a day (TID) | ORAL | Status: DC | PRN

## 2018-05-02 MED ORDER — ROCURONIUM 10MG/ML (10ML) SYRINGE FOR MEDFUSION PUMP - OPTIME
INTRAVENOUS | Status: DC | PRN
  Administered 2018-05-02 (×2): 10 mg via INTRAVENOUS
  Administered 2018-05-02: 40 mg via INTRAVENOUS
  Administered 2018-05-02: 10 mg via INTRAVENOUS

## 2018-05-02 MED ORDER — CITALOPRAM HYDROBROMIDE 20 MG PO TABS
40.0000 mg | ORAL_TABLET | Freq: Every day | ORAL | Status: DC
  Administered 2018-05-03 – 2018-05-04 (×2): 40 mg via ORAL
  Filled 2018-05-02 (×2): qty 2

## 2018-05-02 MED ORDER — CEFAZOLIN SODIUM-DEXTROSE 2-3 GM-%(50ML) IV SOLR
INTRAVENOUS | Status: DC | PRN
  Administered 2018-05-02: 2 g via INTRAVENOUS

## 2018-05-02 MED ORDER — SIMVASTATIN 20 MG PO TABS
20.0000 mg | ORAL_TABLET | Freq: Every day | ORAL | Status: DC
  Administered 2018-05-02 – 2018-05-03 (×2): 20 mg via ORAL
  Filled 2018-05-02 (×2): qty 1

## 2018-05-02 MED ORDER — OXYCODONE HCL 5 MG PO TABS
10.0000 mg | ORAL_TABLET | ORAL | Status: DC | PRN
  Administered 2018-05-03 – 2018-05-04 (×4): 10 mg via ORAL
  Filled 2018-05-02 (×4): qty 2

## 2018-05-02 MED ORDER — PHENOL 1.4 % MT LIQD
1.0000 | OROMUCOSAL | Status: DC | PRN
  Filled 2018-05-02: qty 177

## 2018-05-02 MED ORDER — DEXTROSE 5 % IV SOLN
3.0000 g | Freq: Four times a day (QID) | INTRAVENOUS | Status: AC
  Administered 2018-05-02 – 2018-05-03 (×4): 3 g via INTRAVENOUS
  Filled 2018-05-02: qty 3
  Filled 2018-05-02: qty 3000
  Filled 2018-05-02 (×3): qty 3

## 2018-05-02 MED ORDER — ONDANSETRON HCL 4 MG/2ML IJ SOLN
INTRAMUSCULAR | Status: AC
  Filled 2018-05-02: qty 2

## 2018-05-02 MED ORDER — FENTANYL CITRATE (PF) 100 MCG/2ML IJ SOLN
INTRAMUSCULAR | Status: AC
  Administered 2018-05-02: 25 ug via INTRAVENOUS
  Filled 2018-05-02: qty 2

## 2018-05-02 MED ORDER — MAGNESIUM HYDROXIDE 400 MG/5ML PO SUSP
30.0000 mL | Freq: Every day | ORAL | Status: DC
  Administered 2018-05-03: 30 mL via ORAL
  Filled 2018-05-02: qty 30

## 2018-05-02 MED ORDER — ONDANSETRON HCL 4 MG/2ML IJ SOLN
INTRAMUSCULAR | Status: DC | PRN
  Administered 2018-05-02: 4 mg via INTRAVENOUS

## 2018-05-02 MED ORDER — LIDOCAINE HCL (CARDIAC) PF 100 MG/5ML IV SOSY
PREFILLED_SYRINGE | INTRAVENOUS | Status: DC | PRN
  Administered 2018-05-02: 60 mg via INTRAVENOUS

## 2018-05-02 MED ORDER — GABAPENTIN 300 MG PO CAPS
300.0000 mg | ORAL_CAPSULE | Freq: Every day | ORAL | Status: DC
  Administered 2018-05-02 – 2018-05-03 (×2): 300 mg via ORAL
  Filled 2018-05-02 (×2): qty 1

## 2018-05-02 MED ORDER — PHENYLEPHRINE HCL 10 MG/ML IJ SOLN
INTRAMUSCULAR | Status: DC | PRN
  Administered 2018-05-02 (×2): 200 ug via INTRAVENOUS
  Administered 2018-05-02 (×2): 100 ug via INTRAVENOUS
  Administered 2018-05-02: 200 ug via INTRAVENOUS

## 2018-05-02 MED ORDER — FENTANYL CITRATE (PF) 100 MCG/2ML IJ SOLN
INTRAMUSCULAR | Status: AC
  Filled 2018-05-02: qty 2

## 2018-05-02 MED ORDER — CEFAZOLIN SODIUM-DEXTROSE 2-4 GM/100ML-% IV SOLN
INTRAVENOUS | Status: AC
  Filled 2018-05-02: qty 100

## 2018-05-02 MED ORDER — SPIRONOLACTONE 25 MG PO TABS: 50.0000 mg | ORAL_TABLET | Freq: Every day | ORAL | Status: DC

## 2018-05-02 MED ORDER — ONDANSETRON HCL 4 MG/2ML IJ SOLN: 4.0000 mg | Freq: Once | INTRAMUSCULAR | Status: DC | PRN

## 2018-05-02 MED ORDER — INFLUENZA VAC SPLIT QUAD 0.5 ML IM SUSY
0.5000 mL | PREFILLED_SYRINGE | INTRAMUSCULAR | Status: AC
  Administered 2018-05-04: 0.5 mL via INTRAMUSCULAR
  Filled 2018-05-02: qty 0.5

## 2018-05-02 MED ORDER — FENTANYL CITRATE (PF) 100 MCG/2ML IJ SOLN
INTRAMUSCULAR | Status: DC | PRN
  Administered 2018-05-02 (×2): 100 ug via INTRAVENOUS
  Administered 2018-05-02: 50 ug via INTRAVENOUS
  Administered 2018-05-02: 150 ug via INTRAVENOUS
  Administered 2018-05-02: 50 ug via INTRAVENOUS

## 2018-05-02 MED ORDER — ACETAMINOPHEN 325 MG PO TABS
325.0000 mg | ORAL_TABLET | Freq: Four times a day (QID) | ORAL | Status: DC | PRN
  Administered 2018-05-03: 325 mg via ORAL
  Filled 2018-05-02: qty 1

## 2018-05-02 MED ORDER — FLEET ENEMA 7-19 GM/118ML RE ENEM: 1.0000 | ENEMA | Freq: Once | RECTAL | Status: DC | PRN

## 2018-05-02 MED ORDER — NICOTINE 21 MG/24HR TD PT24
21.0000 mg | MEDICATED_PATCH | Freq: Every day | TRANSDERMAL | Status: DC
  Administered 2018-05-02 – 2018-05-04 (×3): 21 mg via TRANSDERMAL
  Filled 2018-05-02 (×3): qty 1

## 2018-05-02 MED ORDER — TETRACAINE HCL 1 % IJ SOLN
INTRAMUSCULAR | Status: DC | PRN
  Administered 2018-05-02: 10 mg via INTRASPINAL

## 2018-05-02 MED ORDER — TRANEXAMIC ACID 1000 MG/10ML IV SOLN
INTRAVENOUS | Status: DC | PRN
  Administered 2018-05-02: 1000 mg via INTRAVENOUS

## 2018-05-02 MED ORDER — DEXAMETHASONE SODIUM PHOSPHATE 10 MG/ML IJ SOLN
8.0000 mg | Freq: Once | INTRAMUSCULAR | Status: AC
  Administered 2018-05-02: 8 mg via INTRAVENOUS

## 2018-05-02 MED ORDER — ALUM & MAG HYDROXIDE-SIMETH 200-200-20 MG/5ML PO SUSP: 30.0000 mL | ORAL | Status: DC | PRN

## 2018-05-02 MED ORDER — CELECOXIB 200 MG PO CAPS
ORAL_CAPSULE | ORAL | Status: AC
  Administered 2018-05-02: 200 mg via ORAL
  Filled 2018-05-02: qty 2

## 2018-05-02 MED ORDER — PHENYLEPHRINE HCL 10 MG/ML IJ SOLN
INTRAMUSCULAR | Status: AC
  Filled 2018-05-02: qty 1

## 2018-05-02 MED ORDER — KETAMINE HCL 50 MG/ML IJ SOLN
INTRAMUSCULAR | Status: DC | PRN
  Administered 2018-05-02: 50 mg via INTRAMUSCULAR

## 2018-05-02 MED ORDER — GLYCOPYRROLATE 0.2 MG/ML IJ SOLN
INTRAMUSCULAR | Status: AC
  Filled 2018-05-02: qty 1

## 2018-05-02 MED ORDER — BISACODYL 10 MG RE SUPP: 10.0000 mg | Freq: Every day | RECTAL | Status: DC | PRN

## 2018-05-02 MED ORDER — HYDROMORPHONE HCL 1 MG/ML IJ SOLN
INTRAMUSCULAR | Status: DC | PRN
  Administered 2018-05-02: 1 mg via INTRAVENOUS

## 2018-05-02 MED ORDER — EPHEDRINE SULFATE 50 MG/ML IJ SOLN
INTRAMUSCULAR | Status: DC | PRN
  Administered 2018-05-02: 5 mg via INTRAVENOUS

## 2018-05-02 MED ORDER — ONDANSETRON HCL 4 MG/2ML IJ SOLN: 4.0000 mg | Freq: Four times a day (QID) | INTRAMUSCULAR | Status: DC | PRN

## 2018-05-02 MED ORDER — DEXAMETHASONE SODIUM PHOSPHATE 10 MG/ML IJ SOLN
INTRAMUSCULAR | Status: AC
  Administered 2018-05-02: 8 mg via INTRAVENOUS
  Filled 2018-05-02: qty 1

## 2018-05-02 MED ORDER — PROPOFOL 10 MG/ML IV BOLUS
INTRAVENOUS | Status: AC
  Filled 2018-05-02: qty 20

## 2018-05-02 MED ORDER — BUPIVACAINE HCL (PF) 0.5 % IJ SOLN
INTRAMUSCULAR | Status: DC | PRN
  Administered 2018-05-02: 2 mL

## 2018-05-02 MED ORDER — PROPOFOL 10 MG/ML IV BOLUS
INTRAVENOUS | Status: DC | PRN
  Administered 2018-05-02: 120 mg via INTRAVENOUS
  Administered 2018-05-02: 30 mg via INTRAVENOUS
  Administered 2018-05-02: 40 mg via INTRAVENOUS
  Administered 2018-05-02 (×2): 30 mg via INTRAVENOUS

## 2018-05-02 MED ORDER — SUGAMMADEX SODIUM 200 MG/2ML IV SOLN
INTRAVENOUS | Status: DC | PRN
  Administered 2018-05-02: 200 mg via INTRAVENOUS

## 2018-05-02 MED ORDER — BUPIVACAINE HCL (PF) 0.5 % IJ SOLN
INTRAMUSCULAR | Status: AC
  Filled 2018-05-02: qty 10

## 2018-05-02 MED ORDER — SODIUM CHLORIDE 0.9 % IV SOLN
INTRAVENOUS | Status: DC
  Administered 2018-05-02: 17:00:00 via INTRAVENOUS

## 2018-05-02 MED ORDER — KETAMINE HCL 50 MG/ML IJ SOLN
INTRAMUSCULAR | Status: AC
  Filled 2018-05-02: qty 10

## 2018-05-02 MED ORDER — HYDROMORPHONE HCL 1 MG/ML IJ SOLN: 0.5000 mg | INTRAMUSCULAR | Status: DC | PRN

## 2018-05-02 MED ORDER — ACETAMINOPHEN 10 MG/ML IV SOLN
INTRAVENOUS | Status: DC | PRN
  Administered 2018-05-02: 1000 mg via INTRAVENOUS

## 2018-05-02 MED ORDER — HYDROMORPHONE HCL 1 MG/ML IJ SOLN
INTRAMUSCULAR | Status: AC
  Filled 2018-05-02: qty 1

## 2018-05-02 MED ORDER — LACTATED RINGERS IV SOLN
INTRAVENOUS | Status: DC
  Administered 2018-05-02 (×2): via INTRAVENOUS

## 2018-05-02 MED ORDER — PROPOFOL 500 MG/50ML IV EMUL
INTRAVENOUS | Status: AC
  Filled 2018-05-02: qty 50

## 2018-05-02 MED ORDER — GABAPENTIN 300 MG PO CAPS
ORAL_CAPSULE | ORAL | Status: AC
  Administered 2018-05-02: 300 mg via ORAL
  Filled 2018-05-02: qty 1

## 2018-05-02 MED ORDER — PROPOFOL 500 MG/50ML IV EMUL
INTRAVENOUS | Status: DC | PRN
  Administered 2018-05-02: 50 ug/kg/min via INTRAVENOUS

## 2018-05-02 MED ORDER — ACETAMINOPHEN 10 MG/ML IV SOLN
INTRAVENOUS | Status: AC
  Filled 2018-05-02: qty 100

## 2018-05-02 MED ORDER — CHLORHEXIDINE GLUCONATE 4 % EX LIQD: 60.0000 mL | Freq: Once | CUTANEOUS | Status: DC

## 2018-05-02 MED ORDER — FENTANYL CITRATE (PF) 250 MCG/5ML IJ SOLN
INTRAMUSCULAR | Status: AC
  Filled 2018-05-02: qty 5

## 2018-05-02 MED ORDER — TRANEXAMIC ACID-NACL 1000-0.7 MG/100ML-% IV SOLN
1000.0000 mg | Freq: Once | INTRAVENOUS | Status: AC
  Administered 2018-05-02: 1000 mg via INTRAVENOUS
  Filled 2018-05-02: qty 100

## 2018-05-02 MED ORDER — FENTANYL CITRATE (PF) 100 MCG/2ML IJ SOLN
25.0000 ug | INTRAMUSCULAR | Status: DC | PRN
  Administered 2018-05-02 (×4): 25 ug via INTRAVENOUS

## 2018-05-02 MED ORDER — CELECOXIB 200 MG PO CAPS
200.0000 mg | ORAL_CAPSULE | Freq: Two times a day (BID) | ORAL | Status: DC
  Administered 2018-05-02 – 2018-05-04 (×4): 200 mg via ORAL
  Filled 2018-05-02 (×4): qty 1

## 2018-05-02 MED ORDER — MIDAZOLAM HCL 2 MG/2ML IJ SOLN
INTRAMUSCULAR | Status: AC
  Filled 2018-05-02: qty 2

## 2018-05-02 MED ORDER — ROCURONIUM BROMIDE 50 MG/5ML IV SOLN
INTRAVENOUS | Status: AC
  Filled 2018-05-02: qty 2

## 2018-05-02 MED ORDER — GABAPENTIN 300 MG PO CAPS
300.0000 mg | ORAL_CAPSULE | Freq: Once | ORAL | Status: AC
  Administered 2018-05-02: 300 mg via ORAL

## 2018-05-02 MED ORDER — OMEGA-3-ACID ETHYL ESTERS 1 G PO CAPS
1.0000 g | ORAL_CAPSULE | Freq: Every day | ORAL | Status: DC
  Administered 2018-05-03 – 2018-05-04 (×2): 1 g via ORAL
  Filled 2018-05-02 (×2): qty 1

## 2018-05-02 MED ORDER — ACETAMINOPHEN 10 MG/ML IV SOLN
1000.0000 mg | Freq: Four times a day (QID) | INTRAVENOUS | Status: AC
  Administered 2018-05-02 – 2018-05-03 (×3): 1000 mg via INTRAVENOUS
  Filled 2018-05-02 (×4): qty 100

## 2018-05-02 MED ORDER — METOCLOPRAMIDE HCL 5 MG/ML IJ SOLN: 5.0000 mg | Freq: Three times a day (TID) | INTRAMUSCULAR | Status: DC | PRN

## 2018-05-02 MED ORDER — SUCCINYLCHOLINE CHLORIDE 20 MG/ML IJ SOLN
INTRAMUSCULAR | Status: AC
  Filled 2018-05-02: qty 1

## 2018-05-02 MED ORDER — PANTOPRAZOLE SODIUM 40 MG PO TBEC
40.0000 mg | DELAYED_RELEASE_TABLET | Freq: Two times a day (BID) | ORAL | Status: DC
  Administered 2018-05-02 – 2018-05-04 (×3): 40 mg via ORAL
  Filled 2018-05-02 (×4): qty 1

## 2018-05-02 MED ORDER — LIDOCAINE HCL (PF) 2 % IJ SOLN
INTRAMUSCULAR | Status: AC
  Filled 2018-05-02: qty 10

## 2018-05-02 MED ORDER — SODIUM CHLORIDE 0.9 % IV SOLN
INTRAVENOUS | Status: DC | PRN
  Administered 2018-05-02: 30 ug/min via INTRAVENOUS

## 2018-05-02 MED ORDER — NEOMYCIN-POLYMYXIN B GU 40-200000 IR SOLN
Status: DC | PRN
  Administered 2018-05-02: 2 mL

## 2018-05-02 MED ORDER — MIDAZOLAM HCL 5 MG/5ML IJ SOLN
INTRAMUSCULAR | Status: DC | PRN
  Administered 2018-05-02: 2 mg via INTRAVENOUS

## 2018-05-02 MED ORDER — OXYCODONE HCL 5 MG PO TABS
5.0000 mg | ORAL_TABLET | ORAL | Status: DC | PRN
  Administered 2018-05-02 – 2018-05-03 (×2): 5 mg via ORAL
  Filled 2018-05-02 (×3): qty 1

## 2018-05-02 MED ORDER — SUGAMMADEX SODIUM 200 MG/2ML IV SOLN
INTRAVENOUS | Status: AC
  Filled 2018-05-02: qty 2

## 2018-05-02 MED ORDER — ENOXAPARIN SODIUM 30 MG/0.3ML ~~LOC~~ SOLN
30.0000 mg | Freq: Two times a day (BID) | SUBCUTANEOUS | Status: DC
  Administered 2018-05-03 – 2018-05-04 (×3): 30 mg via SUBCUTANEOUS
  Filled 2018-05-02 (×3): qty 0.3

## 2018-05-02 MED ORDER — METOCLOPRAMIDE HCL 10 MG PO TABS
10.0000 mg | ORAL_TABLET | Freq: Three times a day (TID) | ORAL | Status: DC
  Administered 2018-05-02 – 2018-05-04 (×4): 10 mg via ORAL
  Filled 2018-05-02 (×5): qty 1

## 2018-05-02 SURGICAL SUPPLY — 58 items
BLADE DRUM FLTD (BLADE) ×2 IMPLANT
BLADE SAW 90X25X1.19 OSCILLAT (BLADE) ×2 IMPLANT
CANISTER SUCT 1200ML W/VALVE (MISCELLANEOUS) ×2 IMPLANT
CANISTER SUCT 3000ML PPV (MISCELLANEOUS) ×4 IMPLANT
CARTRIDGE OIL MAESTRO DRILL (MISCELLANEOUS) ×1 IMPLANT
COVER WAND RF STERILE (DRAPES) ×2 IMPLANT
CUP ACETBLR 54 OD 100 SERIES (Hips) ×2 IMPLANT
DIFFUSER DRILL AIR PNEUMATIC (MISCELLANEOUS) ×2 IMPLANT
DRAPE INCISE IOBAN 66X60 STRL (DRAPES) ×4 IMPLANT
DRAPE SHEET LG 3/4 BI-LAMINATE (DRAPES) ×2 IMPLANT
DRSG DERMACEA 8X12 NADH (GAUZE/BANDAGES/DRESSINGS) ×2 IMPLANT
DRSG OPSITE POSTOP 4X12 (GAUZE/BANDAGES/DRESSINGS) ×2 IMPLANT
DRSG OPSITE POSTOP 4X14 (GAUZE/BANDAGES/DRESSINGS) IMPLANT
DRSG TEGADERM 4X4.75 (GAUZE/BANDAGES/DRESSINGS) ×2 IMPLANT
DURAPREP 26ML APPLICATOR (WOUND CARE) ×8 IMPLANT
ELECT BLADE 6.5 EXT (BLADE) IMPLANT
ELECT CAUTERY BLADE 6.4 (BLADE) ×2 IMPLANT
GLOVE BIOGEL M STRL SZ7.5 (GLOVE) ×4 IMPLANT
GLOVE BIOGEL PI IND STRL 9 (GLOVE) ×5 IMPLANT
GLOVE BIOGEL PI INDICATOR 9 (GLOVE) ×5
GLOVE INDICATOR 8.0 STRL GRN (GLOVE) ×12 IMPLANT
GLOVE SURG SYN 9.0  PF PI (GLOVE) ×1
GLOVE SURG SYN 9.0 PF PI (GLOVE) ×1 IMPLANT
GOWN STRL REUS W/ TWL LRG LVL3 (GOWN DISPOSABLE) ×4 IMPLANT
GOWN STRL REUS W/TWL 2XL LVL3 (GOWN DISPOSABLE) ×2 IMPLANT
GOWN STRL REUS W/TWL LRG LVL3 (GOWN DISPOSABLE) ×4
HEAD M SROM 36MM PLUS 1.5 (Hips) ×1 IMPLANT
HEMOVAC 400CC 10FR (MISCELLANEOUS) ×2 IMPLANT
HOLDER FOLEY CATH W/STRAP (MISCELLANEOUS) ×2 IMPLANT
HOOD PEEL AWAY FLYTE STAYCOOL (MISCELLANEOUS) ×6 IMPLANT
KIT TURNOVER KIT A (KITS) ×2 IMPLANT
LINER NEUTRAL 36ID 54OD (Liner) ×2 IMPLANT
NDL SAFETY ECLIPSE 18X1.5 (NEEDLE) ×1 IMPLANT
NEEDLE HYPO 18GX1.5 SHARP (NEEDLE) ×1
NS IRRIG 500ML POUR BTL (IV SOLUTION) ×2 IMPLANT
OIL CARTRIDGE MAESTRO DRILL (MISCELLANEOUS) ×2
PACK HIP PROSTHESIS (MISCELLANEOUS) ×2 IMPLANT
PIN STEIN THRED 5/32 (Pin) ×2 IMPLANT
PULSAVAC PLUS IRRIG FAN TIP (DISPOSABLE) ×2
SOL .9 NS 3000ML IRR  AL (IV SOLUTION) ×1
SOL .9 NS 3000ML IRR UROMATIC (IV SOLUTION) ×1 IMPLANT
SOL PREP PVP 2OZ (MISCELLANEOUS) ×2
SOLUTION PREP PVP 2OZ (MISCELLANEOUS) ×1 IMPLANT
SPONGE DRAIN TRACH 4X4 STRL 2S (GAUZE/BANDAGES/DRESSINGS) ×2 IMPLANT
SROM M HEAD 36MM PLUS 1.5 (Hips) ×2 IMPLANT
STAPLER SKIN PROX 35W (STAPLE) ×2 IMPLANT
STEM FEM CMNTLSS SM AML 13.5 (Hips) ×2 IMPLANT
SUT ETHIBOND #5 BRAIDED 30INL (SUTURE) ×2 IMPLANT
SUT VIC AB 0 CT1 36 (SUTURE) ×4 IMPLANT
SUT VIC AB 1 CT1 36 (SUTURE) ×4 IMPLANT
SUT VIC AB 2-0 CT1 27 (SUTURE) ×1
SUT VIC AB 2-0 CT1 TAPERPNT 27 (SUTURE) ×1 IMPLANT
SYR 20CC LL (SYRINGE) ×2 IMPLANT
TAPE CLOTH 3X10 WHT NS LF (GAUZE/BANDAGES/DRESSINGS) ×2 IMPLANT
TAPE TRANSPORE STRL 2 31045 (GAUZE/BANDAGES/DRESSINGS) ×2 IMPLANT
TIP FAN IRRIG PULSAVAC PLUS (DISPOSABLE) ×1 IMPLANT
TOWEL OR 17X26 4PK STRL BLUE (TOWEL DISPOSABLE) ×2 IMPLANT
TRAY FOLEY MTR SLVR 16FR STAT (SET/KITS/TRAYS/PACK) ×2 IMPLANT

## 2018-05-02 NOTE — Anesthesia Procedure Notes (Signed)
Procedure Name: Intubation Date/Time: 05/02/2018 12:00 PM Performed by: Eben Burow, CRNA Pre-anesthesia Checklist: Patient identified, Emergency Drugs available, Suction available, Patient being monitored and Timeout performed Patient Re-evaluated:Patient Re-evaluated prior to induction Oxygen Delivery Method: Circle system utilized Preoxygenation: Pre-oxygenation with 100% oxygen Induction Type: IV induction Laryngoscope Size: McGraph and 3 Grade View: Grade I Tube type: Oral Tube size: 7.0 mm Number of attempts: 2 Airway Equipment and Method: Bougie stylet and Video-laryngoscopy Placement Confirmation: positive ETCO2 and breath sounds checked- equal and bilateral Secured at: 21 cm Tube secured with: Tape Dental Injury: Injury to lip and Bloody posterior oropharynx  Comments: Patient intubated in right lateral decubitus position with McGraph due to failed spinal. Dr. Marcello Moores at bedside throughout procedure. Small abrasion noted to left lower lip after intubation.

## 2018-05-02 NOTE — Anesthesia Procedure Notes (Signed)
Spinal  Patient location during procedure: OR Start time: 05/02/2018 11:04 AM End time: 05/02/2018 11:11 AM Staffing Anesthesiologist: Gunnar Bulla, MD Resident/CRNA: Eben Burow, CRNA Performed: resident/CRNA  Preanesthetic Checklist Completed: patient identified, site marked, surgical consent, pre-op evaluation, timeout performed, IV checked, risks and benefits discussed and monitors and equipment checked Spinal Block Patient position: sitting Prep: Betadine and site prepped and draped Patient monitoring: heart rate, continuous pulse ox and blood pressure Approach: midline Location: L3-4 Injection technique: single-shot Needle Needle type: [place]acre  Needle gauge: 22 G Needle length: 12.7 cm Assessment Sensory level: T6 Additional Notes 40 minutes after spinal injection, patient still had retained motor ability to bilateral lower extremities. Decision made to convert to [place].

## 2018-05-02 NOTE — Anesthesia Post-op Follow-up Note (Signed)
Anesthesia QCDR form completed.        

## 2018-05-02 NOTE — H&P (Signed)
The patient has been re-examined, and the chart reviewed, and there have been no interval changes to the documented history and physical.    The risks, benefits, and alternatives have been discussed at length. The patient expressed understanding of the risks benefits and agreed with plans for surgical intervention.  Brenda Cowher P. Duglas Heier, Jr. M.D.    

## 2018-05-02 NOTE — Transfer of Care (Signed)
Immediate Anesthesia Transfer of Care Note  Patient: Lisa Valencia  Procedure(s) Performed: TOTAL HIP ARTHROPLASTY (Left )  Patient Location: PACU  Anesthesia Type:General  Level of Consciousness: awake and alert   Airway & Oxygen Therapy: Patient Spontanous Breathing and Patient connected to face mask oxygen  Post-op Assessment: Report given to RN and Post -op Vital signs reviewed and stable  Post vital signs: Reviewed and stable  Last Vitals:  Vitals Value Taken Time  BP 165/141 05/02/2018  3:11 PM  Temp 36.8 C 05/02/2018  3:11 PM  Pulse 100 05/02/2018  3:16 PM  Resp 20 05/02/2018  3:16 PM  SpO2 96 % 05/02/2018  3:16 PM  Vitals shown include unvalidated device data.  Last Pain:  Vitals:   05/02/18 0930  TempSrc: Tympanic  PainSc: 2          Complications: small lip lacerations noted to lips possibly from lateral intubation

## 2018-05-02 NOTE — Op Note (Signed)
OPERATIVE NOTE  DATE OF SURGERY:  05/02/2018  PATIENT NAME:  ROSELIN WIEMANN   DOB: 09/16/1960  MRN: [id number]  PRE-OPERATIVE DIAGNOSIS: Degenerative arthrosis of the left hip, primary  POST-OPERATIVE DIAGNOSIS:  Same  PROCEDURE:  Left total hip arthroplasty  SURGEON:  Marciano Sequin. M.D.  ASSISTANT:  Vance Peper, PA (present and scrubbed throughout the case, critical for assistance with exposure, retraction, instrumentation, and closure)  ANESTHESIA: spinal and general  ESTIMATED BLOOD LOSS: 100 mL  FLUIDS REPLACED: 1000 mL of crystalloid  DRAINS: 2 medium drains to a Hemovac reservoir  IMPLANTS UTILIZED: DePuy 13.5 mm small stature AML femoral stem, 54 mm OD Pinnacle 100 acetabular component, neutral Pinnacle Altrx polyethylene insert, and a 36 mm M-SPEC +1.5 mm hip ball  INDICATIONS FOR SURGERY: ILDA LASKIN is a 57 y.o. year old female with a long history of progressive hip and groin  pain. X-rays demonstrated severe degenerative changes. The patient had not seen any significant improvement despite conservative nonsurgical intervention. After discussion of the risks and benefits of surgical intervention, the patient expressed understanding of the risks benefits and agree with plans for total hip arthroplasty.   The risks, benefits, and alternatives were discussed at length including but not limited to the risks of infection, bleeding, nerve injury, stiffness, blood clots, the need for revision surgery, limb length inequality, dislocation, cardiopulmonary complications, among others, and they were willing to proceed.  PROCEDURE IN DETAIL: The patient was brought into the operating room and, after adequate spinal and general anesthesia was achieved, the patient was placed in a right lateral decubitus position. Axillary roll was placed and all bony prominences were well-padded. The patient's left hip was cleaned and prepped with alcohol and DuraPrep and draped in the usual sterile  fashion. A "timeout" was performed as per usual protocol. A lateral curvilinear incision was made gently curving towards the posterior superior iliac spine. The IT band was incised in line with the skin incision and the fibers of the gluteus maximus were split in line. The piriformis tendon was identified, skeletonized, and incised at its insertion to the proximal femur and reflected posteriorly. A T type posterior capsulotomy was performed. Prior to dislocation of the femoral head, a threaded Steinmann pin was inserted through a separate stab incision into the pelvis superior to the acetabulum and bent in the form of a stylus so as to assess limb length and hip offset throughout the procedure. The femoral head was then dislocated posteriorly. Inspection of the femoral head demonstrated severe degenerative changes with full-thickness loss of articular cartilage. The femoral neck cut was performed using an oscillating saw. The anterior capsule was elevated off of the femoral neck using a periosteal elevator. Attention was then directed to the acetabulum. The remnant of the labrum was excised using electrocautery. Inspection of the acetabulum also demonstrated significant degenerative changes. The acetabulum was reamed in sequential fashion up to a 53 mm diameter. Good punctate bleeding bone was encountered. A 54 mm Pinnacle 100 acetabular component was positioned and impacted into place. Good scratch fit was appreciated. A neutral polyethylene trial was inserted.  Attention was then directed to the proximal femur. A hole for reaming of the proximal femoral canal was created using a high-speed burr. The femoral canal was reamed in sequential fashion up to a 13 mm diameter. This allowed for approximately 6 cm of scratch fit. Serial broaches were inserted up to a 13.5 mm small stature femoral broach. Calcar region was planed and a  trial reduction was performed using a 36 mm hip ball with a +1.5 mm neck length. Good  equalization of limb lengths and hip offset was appreciated and excellent stability was noted both anteriorly and posteriorly. Trial components were removed. The acetabular shell was irrigated with copious amounts of normal saline with antibiotic solution and suctioned dry. A neutral Pinnacle Altrx polyethylene insert was positioned and impacted into place. Next, a 13.5 mm small stature AML femoral stem was positioned and impacted into place. Excellent scratch fit was appreciated. A trial reduction was again performed with a 36 mm hip ball with a +1.5 mm neck length. Again, good equalization of limb lengths was appreciated and excellent stability appreciated both anteriorly and posteriorly. The hip was then dislocated and the trial hip ball was removed. The Morse taper was cleaned and dried. A 36 mm M-SPEC hip ball with a +1.5 mm neck length was placed on the trunnion and impacted into place. The hip was then reduced and placed through range of motion. Excellent stability was appreciated both anteriorly and posteriorly.  The wound was irrigated with copious amounts of normal saline with antibiotic solution and suctioned dry. Good hemostasis was appreciated. The posterior capsulotomy was repaired using #5 Ethibond. Piriformis tendon was reapproximated to the undersurface of the gluteus medius tendon using #5 Ethibond. Two medium drains were placed in the wound bed and brought out through separate stab incisions to be attached to a Hemovac reservoir. The IT band was reapproximated using interrupted sutures of #1 Vicryl. Subcutaneous tissue was approximated using first #0 Vicryl followed by #2-0 Vicryl. The skin was closed with skin staples.  The patient tolerated the procedure well and was transported to the recovery room in stable condition.   Marciano Sequin., M.D.

## 2018-05-02 NOTE — Anesthesia Preprocedure Evaluation (Signed)
Anesthesia Evaluation  Patient identified by MRN, date of birth, ID band Patient awake    Reviewed: Allergy & Precautions, NPO status , Patient's Chart, lab work & pertinent test results, reviewed documented beta blocker date and time   Airway Mallampati: III  TM Distance: >3 FB     Dental  (+) Chipped   Pulmonary Current Smoker,           Cardiovascular      Neuro/Psych PSYCHIATRIC DISORDERS Anxiety Depression  Neuromuscular disease    GI/Hepatic GERD  Controlled,  Endo/Other    Renal/GU      Musculoskeletal  (+) Arthritis ,   Abdominal   Peds  Hematology   Anesthesia Other Findings Obese.smokes. EKG ok.  Reproductive/Obstetrics                             Anesthesia Physical Anesthesia Plan  ASA: III  Anesthesia Plan: Spinal   Post-op Pain Management:    Induction:   PONV Risk Score and Plan:   Airway Management Planned:   Additional Equipment:   Intra-op Plan:   Post-operative Plan:   Informed Consent: I have reviewed the patients History and Physical, chart, labs and discussed the procedure including the risks, benefits and alternatives for the proposed anesthesia with the patient or authorized representative who has indicated his/her understanding and acceptance.     Plan Discussed with: CRNA  Anesthesia Plan Comments:         Anesthesia Quick Evaluation

## 2018-05-02 NOTE — NC FL2 (Signed)
[place] LEVEL OF CARE SCREENING TOOL     IDENTIFICATION  Patient Name: Lisa Valencia Birthdate: June 14, 1961 Sex: female Admission Date (Current Location): 05/02/2018  [place] and [place] Number:  Engineering geologist and Address:  [facility], [address] [place], [place] [address]      Provider Number: [id number]  Attending Physician Name and Address:  Dereck Leep, MD  Relative Name and Phone Number:       Current Level of Care: Hospital Recommended Level of Care: [place] Prior Approval Number:    Date Approved/Denied:   PASRR Number: [id number])  Discharge Plan: SNF    Current Diagnoses: Patient Active Problem List   Diagnosis Date Noted  . Status post total replacement of hip 05/02/2018  . Primary osteoarthritis of left hip 01/30/2018  . DDD (degenerative disc disease), lumbar 02/12/2017  . Snoring 02/03/2016  . Special screening for malignant neoplasms, colon   . Benign neoplasm of sigmoid colon   . Hepatic steatosis 03/09/2014  . Encounter for preventive health examination 03/08/2014  . S/P hysterectomy 03/05/2014  . Sciatica 04/29/2013  . Hyperlipidemia, unspecified 01/28/2013  . Obesity (BMI 30-39.9) 01/25/2012  . Prediabetes 01/25/2012  . Routine general medical examination at a health care facility 01/25/2012  . Tobacco abuse 01/25/2012  . Esophagitis, reflux   . Diverticulosis of intestine     Orientation RESPIRATION BLADDER Height & Weight     Self, Time, Situation, Place  O2(2 Liters Oxygen. ) Continent Weight: 281 lb 4.9 oz (127.6 kg) Height:  5\' 10"  (177.8 cm)  BEHAVIORAL SYMPTOMS/MOOD NEUROLOGICAL BOWEL NUTRITION STATUS      Continent Diet(Diet: Regular )  AMBULATORY STATUS COMMUNICATION OF NEEDS Skin   Extensive Assist Verbally Surgical wounds(Incision: Left Hip. )                       Personal Care Assistance Level of Assistance  Bathing, Feeding, Dressing  Bathing Assistance: Limited assistance Feeding assistance: Independent Dressing Assistance: Limited assistance     Functional Limitations Info  Sight, Hearing, Speech Sight Info: Adequate Hearing Info: Adequate Speech Info: Adequate    SPECIAL CARE FACTORS FREQUENCY  PT (By licensed PT), OT (By licensed OT)     PT Frequency: (5) OT Frequency: (5)            Contractures      Additional Factors Info  Code Status Code Status Info: (not on file )             Current Medications (05/02/2018):  This is the current hospital active medication list Current Facility-Administered Medications  Medication Dose Route Frequency Provider Last Rate Last Dose  . ceFAZolin (ANCEF) 2-4 GM/100ML-% IVPB           . chlorhexidine (HIBICLENS) 4 % liquid 4 application  60 mL Topical Once [name], Laurice Record, MD      . fentaNYL (SUBLIMAZE) injection 25 mcg  25 mcg Intravenous Q5 min PRN Gunnar Bulla, MD   25 mcg at 05/02/18 1550  . lactated ringers infusion   Intravenous Continuous Martha Clan, MD 100 mL/hr at 05/02/18 1000    . nicotine (NICODERM CQ - dosed in mg/24 hours) patch 21 mg  21 mg Transdermal Daily [name], Laurice Record, MD      . ondansetron (ZOFRAN) injection 4 mg  4 mg Intravenous Once PRN Gunnar Bulla, MD         Discharge Medications: Please see discharge  summary for a list of discharge medications.  Relevant Imaging Results:  Relevant Lab Results:   Additional Information (SSN: [id number])  Jaqlyn Gruenhagen, Veronia Beets, LCSW

## 2018-05-03 ENCOUNTER — Encounter
Admission: RE | Admit: 2018-05-03 | Discharge: 2018-05-03 | Disposition: A | Discharge status: Home / Self Care | Payer: Managed Care, Other (non HMO) | Source: Ambulatory Visit | Attending: Internal Medicine | Admitting: Internal Medicine

## 2018-05-03 ENCOUNTER — Encounter: Payer: Self-pay | Admitting: Orthopedic Surgery

## 2018-05-03 MED ORDER — OXYCODONE HCL 5 MG PO TABS: 5.0000 mg | ORAL_TABLET | ORAL | 0 refills | Status: DC | PRN

## 2018-05-03 MED ORDER — ENOXAPARIN SODIUM 30 MG/0.3ML ~~LOC~~ SOLN: 30.0000 mg | Freq: Two times a day (BID) | SUBCUTANEOUS | 0 refills | Status: DC

## 2018-05-03 MED ORDER — TRAMADOL HCL 50 MG PO TABS: 50.0000 mg | ORAL_TABLET | ORAL | 0 refills | Status: DC | PRN

## 2018-05-03 NOTE — Anesthesia Postprocedure Evaluation (Signed)
Anesthesia Post Note  Patient: Lisa Valencia  Procedure(s) Performed: TOTAL HIP ARTHROPLASTY (Left )  Patient location during evaluation: Nursing Unit Anesthesia Type: Spinal Level of consciousness: oriented and awake and alert Pain management: pain level controlled Vital Signs Assessment: post-procedure vital signs reviewed and stable Respiratory status: spontaneous breathing and respiratory function stable Cardiovascular status: blood pressure returned to baseline and stable Postop Assessment: no headache, no backache and no apparent nausea or vomiting Anesthetic complications: no     Last Vitals:  Vitals:   05/03/18 0815 05/03/18 1156  BP: (!) 110/54 (!) 108/57  Pulse: 91 90  Resp: 16 18  Temp: 36.9 C 36.7 C  SpO2: 96% 97%    Last Pain:  Vitals:   05/03/18 1156  TempSrc: Oral  PainSc:                  Brantley Fling

## 2018-05-03 NOTE — Clinical Social Work Note (Signed)
Clinical Social Work Assessment  Patient Details  Name: Lisa Valencia MRN: [id number] Date of Birth: 21-Aug-1960  Date of referral:  05/03/18               Reason for consult:  Facility Placement                Permission sought to share information with:  Chartered certified accountant granted to share information::  Yes, Verbal Permission Granted  Name::      [place]::   [place]   Relationship::     Contact Information:     Housing/Transportation Living arrangements for the past 2 months:  [place] of Information:  Patient Patient Interpreter Needed:  None Criminal Activity/Legal Involvement Pertinent to Current Situation/Hospitalization:  No - Comment as needed Significant Relationships:  Spouse Lives with:  Spouse Do you feel safe going back to the place where you live?  Yes Need for family participation in patient care:  Yes (Comment)  Care giving concerns:  Patient lives in [place] with her husband Shanon Brow.    Social Worker assessment / plan:  Holiday representative (CSW) received SNF consult. PT is recommending SNF. CSW met with patient alone at bedside to discuss D/C plan. Patient was alert and oriented X4 and was sitting up in the bed. CSW introduced self and explained role of CSW department. Per patient she lives in [place] with her husband. CSW explained that PT is recommending SNF and that Christella Scheuermann will have to approve it. Patient verbalized her understanding and requested [facility]. FL2 complete and faxed out.   CSW presented bed offers to patient and she chose [facility]. Patient is okay with a semi-private room. Per [facility] admissions coordinator at [facility] she will start [facility] authorization today. CSW will continue to follow and assist as needed.    Employment status:  Therapist, music:  Managed Care PT Recommendations:  [place] / Referral to  community resources:  [place]  Patient/Family's Response to care:  Patient is agreeable to D/C to [place].   Patient/Family's Understanding of and Emotional Response to Diagnosis, Current Treatment, and Prognosis:  Patient was very pleasant and thanked CSW for assistance.   Emotional Assessment Appearance:  Appears stated age Attitude/Demeanor/Rapport:    Affect (typically observed):  Accepting, Adaptable, Pleasant Orientation:  Oriented to Self, Oriented to Place, Oriented to  Time, Oriented to Situation Alcohol / Substance use:  Not Applicable Psych involvement (Current and /or in the community):  No (Comment)  Discharge Needs  Concerns to be addressed:  Discharge Planning Concerns Readmission within the last 30 days:  No Current discharge risk:  Dependent with Mobility Barriers to Discharge:  Continued Medical Work up   [employer], Veronia Beets, LCSW 05/03/2018, 1:30 PM

## 2018-05-03 NOTE — Evaluation (Signed)
Occupational Therapy Evaluation Patient Details Name: Lisa Valencia MRN: [id number] DOB: 08/13/1960 Today's Date: 05/03/2018    History of Present Illness Pt is a 57 yo F diagnosed with degenerative arthrosis of the left hip and is now s/p elective L THA.  PMH includes GERD, anxiety, and depression.     Clinical Impression   Pt seen for OT evaluation this date, POD#1 from above surgery. Pt was independent in most ADL tasks requiring assistance from husband for donning/doffing socks and shoes prior to surgery, however occasionally using a SPC for mobility and AE for LB bathing due to L hip pain. Pt is eager to return to PLOF with less pain and improved safety and independence. Pt currently requires minimal assist for LB dressing and taking sponge bath while in seated position due to pain and limited AROM of L hip. Pt able to recall 3/3 posterior total hip precautions at start of session and unable to verbalize how to implement during ADL and mobility. Pt instructed in posterior total hip precautions and how to implement, self care skills, falls prevention strategies, home/routines modifications, DME/AE for LB bathing and dressing tasks, and compression stocking mgt strategies. Pt would benefit from additional instruction in self care skills and techniques to help maintain precautions with or without assistive devices to support recall and carryover prior to discharge. Recommend SNF upon discharge.       Follow Up Recommendations  SNF    Equipment Recommendations  Management consultant)    Recommendations for Other Services       Precautions / Restrictions Precautions Precautions: Posterior Hip Precaution Booklet Issued: Yes (comment) Restrictions Weight Bearing Restrictions: Yes LLE Weight Bearing: Weight bearing as tolerated      Mobility Bed Mobility Overal bed mobility: Needs Assistance Bed Mobility: Sit to Supine     Supine to sit: Min assist Sit to supine: Min assist   General bed  mobility comments: MIN A for LLE and hips during repositioning with verbal cues for mainitaining hip precautions.    Transfers Overall transfer level: Needs assistance Equipment used: Rolling walker (2 wheeled) Transfers: Sit to/from Stand Sit to Stand: Min guard         General transfer comment: VC for maintaining hip precautions     Balance Overall balance assessment: History of Falls;Needs assistance Sitting-balance support: Feet supported;No upper extremity supported Sitting balance-Leahy Scale: Good     Standing balance support: Bilateral upper extremity supported Standing balance-Leahy Scale: Fair                             ADL either performed or assessed with clinical judgement   ADL Overall ADL's : Needs assistance/impaired Eating/Feeding: Independent;Sitting   Grooming: Sitting;Independent   Upper Body Bathing: Sitting;Supervision/ safety   Lower Body Bathing: Adhering to hip precautions;Sitting/lateral leans;With adaptive equipment;Minimal assistance   Upper Body Dressing : Supervision/safety;Sitting   Lower Body Dressing: With adaptive equipment;Adhering to hip precautions;Minimal assistance;Sitting/lateral leans   Toilet Transfer: RW;Adhering to hip precautions;Min guard           Functional mobility during ADLs: Min guard       Vision Baseline Vision/History: Wears glasses Wears Glasses: Reading only Patient Visual Report: No change from baseline       Perception     Praxis      Pertinent Vitals/Pain Pain Assessment: 0-10 Pain Score: 0-No pain Pain Location: Pt presented in recliner with 0 pain in hip and 4 in lower back.  Pain at 10 when adjusting hips during bed mobility. Pain Descriptors / Indicators: Sharp Pain Intervention(s): Monitored during session;Repositioned     Hand Dominance     Extremity/Trunk Assessment Upper Extremity Assessment Upper Extremity Assessment: Overall WFL for tasks assessed   Lower Extremity  Assessment Lower Extremity Assessment: Defer to PT evaluation       Communication Communication Communication: No difficulties   Cognition Arousal/Alertness: Awake/alert Behavior During Therapy: WFL for tasks assessed/performed Overall Cognitive Status: Within Functional Limits for tasks assessed                                     General Comments       Exercises Other Exercises Other Exercises: Pt educated in comprerssion sock mgt including donning/doffing, wear schedule and positioning. Other Exercises: Pt educated in LB dressing strategies with use of AE. Other Exercises: Pt educated in and demonstrated 3/3 hip precautions during sit to stand and stand to sit transfers. Other Exercises: Pt educated in breathing techniques including pursed lip breathing to breathe through pain.   Shoulder Instructions      Home Living Family/patient expects to be discharged to:: Private residence Living Arrangements: Spouse/significant other Available Help at Discharge: Family;Available PRN/intermittently Type of Home: House Home Access: Stairs to enter [employer] of Steps: 2 Entrance Stairs-Rails: Right;Left;Can reach both Home Layout: One level               Home Equipment: Cane - single point;Walker - 2 wheels;Bedside commode   Additional Comments: Discussed pet safety mgt strategies      Prior Functioning/Environment Level of Independence: Needs assistance  Gait / Transfers Assistance Needed: Pt ambulates with SPC in community and recently needed it for mobility in the home due to pain in hip and back.  Pt reports 2 falls in the last year ADL's / Homemaking Assistance Needed: Husband assists with donning/doffing socks and shoes, use of AE for UB and LB bathing. Pt will be taking sponge baths during recovery.             OT Problem List: Decreased strength;Impaired balance (sitting and/or standing);Pain;Decreased range of motion;Decreased  activity tolerance;Decreased knowledge of use of DME or AE      OT Treatment/Interventions: Self-care/ADL training;DME and/or AE instruction;Therapeutic activities;Therapeutic exercise;Patient/family education    OT Goals(Current goals can be found in the care plan section) Acute Rehab OT Goals Patient Stated Goal: to get back to quilting  OT Goal Formulation: With patient Time For Goal Achievement: 05/17/18 Potential to Achieve Goals: Good ADL Goals Pt Will Perform Lower Body Bathing: sitting/lateral leans;with min assist;with adaptive equipment Pt Will Perform Lower Body Dressing: with adaptive equipment;sitting/lateral leans;with min assist Additional ADL Goal #1: Pt will independently demonstrate 3/3 hip precautions while completing ADL and IADL tasks  OT Frequency: Min 2X/week   Barriers to D/C:    Pt does not have 24/7 coverage at home during recovery.       Co-evaluation              AM-PAC PT "6 Clicks" Daily Activity     Outcome Measure Help from another person eating meals?: None Help from another person taking care of personal grooming?: None Help from another person toileting, which includes using toliet, bedpan, or urinal?: A Little Help from another person bathing (including washing, rinsing, drying)?: A Little Help from another person to put on and taking off regular upper body  clothing?: None Help from another person to put on and taking off regular lower body clothing?: A Little 6 Click Score: 21   End of Session Equipment Utilized During Treatment: Gait belt;Rolling walker  Activity Tolerance: Patient tolerated treatment well Patient left: in bed;with call bell/phone within reach;with bed alarm set;with SCD's reapplied  OT Visit Diagnosis: Other abnormalities of gait and mobility (R26.89);History of falling (Z91.81);Pain Pain - Right/Left: Left Pain - part of body: Hip                Time: 4784-1282 OT Time Calculation (min): 30 min Charges:      Jadene Pierini OTS  05/03/2018, 12:14 PM

## 2018-05-03 NOTE — Discharge Summary (Signed)
Physician Discharge Summary  Patient ID: Lisa Valencia [name] DOB/AGE: 57-17-1962 57 y.o.  Admit date: 05/02/2018 Discharge date: 05/04/2018  Admission Diagnoses:  PRIMARY OSTEOARTHRITIS OF LEFT HIP   Discharge Diagnoses: Patient Active Problem List   Diagnosis Date Noted  . Status post total replacement of hip 05/02/2018  . Primary osteoarthritis of left hip 01/30/2018  . DDD (degenerative disc disease), lumbar 02/12/2017  . Snoring 02/03/2016  . Special screening for malignant neoplasms, colon   . Benign neoplasm of sigmoid colon   . Hepatic steatosis 03/09/2014  . Encounter for preventive health examination 03/08/2014  . S/P hysterectomy 03/05/2014  . Sciatica 04/29/2013  . Hyperlipidemia, unspecified 01/28/2013  . Obesity (BMI 30-39.9) 01/25/2012  . Prediabetes 01/25/2012  . Routine general medical examination at a health care facility 01/25/2012  . Tobacco abuse 01/25/2012  . Esophagitis, reflux   . Diverticulosis of intestine     Past Medical History:  Diagnosis Date  . Anxiety   . Arthritis   . Depression   . Diverticulosis of intestine 2010   iftikhar  . Esophagitis, reflux 2010    EGD by iftkihar, managed wiht dexilant  . GERD (gastroesophageal reflux disease)   . Impaired fasting glucose 01/25/2012   Needs eye exam.       Transfusion: Transfusions during this admission   Consultants (if any):   Discharged Condition: Improved  Hospital Course: Lisa Valencia is an 57 y.o. female who was admitted 05/02/2018 with a diagnosis of degenerative arthrosis left hip and went to the operating room on 05/02/2018 and underwent the above named procedures.    Surgeries:Procedure(s): TOTAL HIP ARTHROPLASTY on 05/02/2018  PRE-OPERATIVE DIAGNOSIS: Degenerative arthrosis of the left hip, primary  POST-OPERATIVE DIAGNOSIS:  Same  PROCEDURE:  Left total hip arthroplasty  SURGEON:  Marciano Sequin. M.D.  ASSISTANT:  Vance Peper, PA (present and  scrubbed throughout the case, critical for assistance with exposure, retraction, instrumentation, and closure)  ANESTHESIA: spinal and general  ESTIMATED BLOOD LOSS: 100 mL  FLUIDS REPLACED: 1000 mL of crystalloid  DRAINS: 2 medium drains to a Hemovac reservoir  IMPLANTS UTILIZED: DePuy 13.5 mm small stature AML femoral stem, 54 mm OD Pinnacle 100 acetabular component, neutral Pinnacle Altrx polyethylene insert, and a 36 mm M-SPEC +1.5 mm hip ball  INDICATIONS FOR SURGERY: Lisa Valencia is a 57 y.o. year old female with a long history of progressive hip and groin  pain. X-rays demonstrated severe degenerative changes. The patient had not seen any significant improvement despite conservative nonsurgical intervention. After discussion of the risks and benefits of surgical intervention, the patient expressed understanding of the risks benefits and agree with plans for total hip arthroplasty.   The risks, benefits, and alternatives were discussed at length including but not limited to the risks of infection, bleeding, nerve injury, stiffness, blood clots, the need for revision surgery, limb length inequality, dislocation, cardiopulmonary complications, among others, and they were willing to proceed. Patient tolerated the surgery well. No complications .Patient was taken to PACU where she was stabilized and then transferred to the orthopedic floor.  Patient started on Lovenox 30 mg q 12 hrs. Foot pumps applied bilaterally at 80 mm hgb. Heels elevated off bed with rolled towels. No evidence of DVT. Calves non tender. Negative Homan. Physical therapy started on day #1 for gait training and transfer with OT starting on  day #1 for ADL and assisted devices. Patient has done well with therapy. Ambulated 40 feet upon being discharged.  Patient's IV And Foley were discontinued on day #1 with Hemovac being discontinued on day #2. Dressing was changed on day 2 prior to patient being discharged   She  was given perioperative antibiotics:  Anti-infectives (From admission, onward)   Start     Dose/Rate Route Frequency Ordered Stop   05/02/18 1700  ceFAZolin (ANCEF) 3 g in dextrose 5 % 50 mL IVPB     3 g 100 mL/hr over 30 Minutes Intravenous Every 6 hours 05/02/18 1618 05/03/18 1659   05/02/18 0912  ceFAZolin (ANCEF) 2-4 GM/100ML-% IVPB    Note to Pharmacy:  Myles Lipps   : cabinet override      05/02/18 0912 05/02/18 2129   05/02/18 0600  ceFAZolin (ANCEF) IVPB 2g/100 mL premix  Status:  Discontinued     2 g 200 mL/hr over 30 Minutes Intravenous On call to O.R. 05/01/18 2224 05/02/18 3220    .  She was fitted with AV 1 compression foot pump devices, instructed on heel pumps, early ambulation, and fitted with TED stockings bilaterally for DVT prophylaxis.  She benefited maximally from the hospital stay and there were no complications.    Recent vital signs:  Vitals:   05/02/18 2334 05/03/18 0355  BP: 119/66 112/63  Pulse: (!) 106 98  Resp: 15 16  Temp: 98.2 F (36.8 C) 98.1 F (36.7 C)  SpO2: 94% 91%    Recent laboratory studies:  Lab Results  Component Value Date   HGB 15.6 04/19/2018   HGB 14.3 02/01/2016   HGB 14.7 01/26/2015   Lab Results  Component Value Date   WBC 7.0 04/19/2018   PLT 194 04/19/2018   Lab Results  Component Value Date   INR 0.90 04/19/2018   Lab Results  Component Value Date   NA 141 04/19/2018   K 4.4 04/19/2018   CL 101 04/19/2018   CO2 27 04/19/2018   BUN 14 04/19/2018   CREATININE 0.87 04/19/2018   GLUCOSE 99 04/19/2018    Discharge Medications:   Allergies as of 05/03/2018      Reactions   Sulfa Antibiotics Swelling, Other (See Comments)   Angioedema THROAT AND TONGUE HAD SWELLING   Codeine Sulfate Nausea Only   Fenofibrate Other (See Comments)   Muscle aches   Pseudoephedrine Hcl Palpitations   Wellbutrin [bupropion] Hives      Medication List    STOP taking these medications   aspirin 81 MG tablet     TAKE  these medications   celecoxib 200 MG capsule Commonly known as:  CELEBREX TAKE 1 CAPSULE BY MOUTH TWICE DAILY   citalopram 40 MG tablet Commonly known as:  CELEXA Take 1 tablet (40 mg total) by mouth daily.   diclofenac sodium 1 % Gel Commonly known as:  VOLTAREN Apply 2 g topically 4 (four) times daily.   enoxaparin 30 MG/0.3ML injection Commonly known as:  LOVENOX Inject 0.3 mLs (30 mg total) into the skin every 12 (twelve) hours. Start taking on:  05/05/2018   Fish Oil 1000 MG Caps Take 1,000 mg by mouth daily.   HYDROcodone-acetaminophen 10-325 MG tablet Commonly known as:  NORCO Take 1 tablet by mouth 2 (two) times daily as needed. What changed:  reasons to take this   loratadine 10 MG tablet Commonly known as:  CLARITIN Take 10 mg by mouth daily.   oxyCODONE 5 MG immediate release tablet Commonly known as:  Oxy IR/ROXICODONE Take 1 tablet (5 mg total) by mouth every 4 (four) hours  as needed for moderate pain (pain score 4-6).   pantoprazole 40 MG tablet Commonly known as:  PROTONIX TAKE ONE TABLET BY MOUTH TWICE DAILY   polyethylene glycol packet Commonly known as:  MIRALAX / GLYCOLAX Take 17 g by mouth daily.   simvastatin 20 MG tablet Commonly known as:  ZOCOR TAKE ONE TABLET BY MOUTH AT BEDTIME What changed:  when to take this   spironolactone 50 MG tablet Commonly known as:  ALDACTONE Take 1 tablet (50 mg total) by mouth daily. What changed:    when to take this  reasons to take this   traMADol 50 MG tablet Commonly known as:  ULTRAM TAKE 1 TO 2 TABLETS BY MOUTH EVERY 6 HOURS AS NEEDED FOR SEVERE BACK PAIN. MAX OF 6 PER DAY. What changed:    how much to take  how to take this  when to take this  reasons to take this  additional instructions   traMADol 50 MG tablet Commonly known as:  ULTRAM Take 1-2 tablets (50-100 mg total) by mouth every 4 (four) hours as needed for moderate pain. What changed:  You were already taking a medication  with the same name, and this prescription was added. Make sure you understand how and when to take each.            Durable Medical Equipment  (From admission, onward)         Start     Ordered   05/02/18 1619  DME Walker rolling  Once    Question:  Patient needs a walker to treat with the following condition  Answer:  S/P total hip arthroplasty   05/02/18 1618   05/02/18 1619  DME Bedside commode  Once    Question:  Patient needs a bedside commode to treat with the following condition  Answer:  S/P total hip arthroplasty   05/02/18 1618          Diagnostic Studies: Dg Hip Port Unilat With Pelvis 1v Left  Result Date: 05/02/2018 CLINICAL DATA:  Status post left hip arthroplasty. EXAM: DG HIP (WITH OR WITHOUT PELVIS) 1V PORT LEFT COMPARISON:  None. FINDINGS: The left femoral and acetabular components appear to be well situated. No fracture or dislocation is noted. Expected postoperative changes are seen in the surrounding soft tissues. IMPRESSION: Status post left total hip arthroplasty. Electronically Signed   By: Marijo Conception, M.D.   On: 05/02/2018 15:50    Disposition:   Discharge Instructions    Increase activity slowly   Complete by:  As directed       Follow-up Information    [name], Laurice Record, MD On 06/19/2018.   Specialty:  Orthopedic Surgery Why:  at 9:45am Contact information: [place] [address] [phone]            Signed: Watt Climes 05/03/2018, 7:34 AM

## 2018-05-03 NOTE — Progress Notes (Signed)
Patient was able to dangle at bedside this am.  Patient feeling much bettter

## 2018-05-03 NOTE — Plan of Care (Signed)

## 2018-05-03 NOTE — Clinical Social Work Placement (Signed)
   CLINICAL SOCIAL WORK PLACEMENT  NOTE  Date:  05/03/2018  Patient Details  Name: ANGILA WOMBLES MRN: [id number] Date of Birth: 1960/09/08  Clinical Social Work is seeking post-discharge placement for this patient at the [place] level of care (*CSW will initial, date and re-position this form in  chart as items are completed):  Yes   Patient/family provided with [place] Work Department's list of facilities offering this level of care within the geographic area requested by the patient (or if unable, by the patient's family).  Yes   Patient/family informed of their freedom to choose among providers that offer the needed level of care, that participate in Medicare, Medicaid or managed care program needed by the patient, have an available bed and are willing to accept the patient.  Yes   Patient/family informed of [facility]'s ownership interest in [facility] and [facility], as well as of the fact that they are under no obligation to receive care at these facilities.  PASRR submitted to EDS on 05/02/18     PASRR number received on 05/02/18     Existing PASRR number confirmed on       FL2 transmitted to all facilities in geographic area requested by pt/family on 05/02/18     FL2 transmitted to all facilities within larger geographic area on       Patient informed that his/her managed care company has contracts with or will negotiate with certain facilities, including the following:        Yes   Patient/family informed of bed offers received.  Patient chooses bed at [facility] )     Physician recommends and patient chooses bed at      Patient to be transferred to   on  .  Patient to be transferred to facility by       Patient family notified on   of transfer.  Name of family member notified:        PHYSICIAN       Additional Comment:    _______________________________________________ Shalom Mcguiness, Veronia Beets,  LCSW 05/03/2018, 1:30 PM

## 2018-05-03 NOTE — Progress Notes (Signed)
Physical Therapy Treatment Patient Details Name: Lisa Valencia MRN: [id number] DOB: 08/02/1960 Today's Date: 05/03/2018    History of Present Illness Pt is a 57 yo F diagnosed with degenerative arthrosis of the left hip and is now s/p elective L THA.  PMH includes GERD, anxiety, and depression.      PT Comments    Pt presents with deficits in strength, transfers, mobility, gait, balance, and activity tolerance but made good progress towards goals this session, especially with gait.   Pt continued to require assistance with bed mobility tasks secondary to LLE functional weakness but was able to amb 40' this session with a RW.  Pt remains antalgic on the LLE during ambulation walking with a step-to gait pattern with decreased LLE stance time and required cues for proper sequencing for general safety and to maintain hip precautions during L turns.  Pt's SpO2 and HR were WNL during the session.  Pt will benefit from PT services in a SNF setting upon discharge to safely address above deficits for decreased caregiver assistance and eventual return to PLOF.      Follow Up Recommendations  SNF     Equipment Recommendations  None recommended by PT    Recommendations for Other Services       Precautions / Restrictions Precautions Precautions: Posterior Hip Precaution Booklet Issued: Yes (comment) Restrictions Weight Bearing Restrictions: Yes LLE Weight Bearing: Weight bearing as tolerated    Mobility  Bed Mobility Overal bed mobility: Needs Assistance Bed Mobility: Sit to Supine       Sit to supine: Min assist   General bed mobility comments: Min A for LLE into bed and for repositioning with verbal cues for mainitaining hip precautions.    Transfers Overall transfer level: Needs assistance Equipment used: Rolling walker (2 wheeled) Transfers: Sit to/from Stand Sit to Stand: Min guard         General transfer comment: VC for maintaining hip precautions    Ambulation/Gait Ambulation/Gait assistance: Min guard Gait Distance (Feet): 40 Feet Assistive device: Rolling walker (2 wheeled) Gait Pattern/deviations: Step-to pattern;Decreased stance time - left;Antalgic;Trunk flexed Gait velocity: decreased   General Gait Details: Slow cadence with amb with step-to pattern antalgic on the LLE; mIn verbal cues given for proper sequencing during L turns to prevent CKC L hip IR   Stairs             Wheelchair Mobility    Modified Rankin (Stroke Patients Only)       Balance Overall balance assessment: Mild deficits observed, not formally tested Sitting-balance support: Feet supported;No upper extremity supported Sitting balance-Leahy Scale: Good     Standing balance support: Bilateral upper extremity supported Standing balance-Leahy Scale: Fair Standing balance comment: Heavy reliance on UE support during standing                            Cognition Arousal/Alertness: Awake/alert Behavior During Therapy: WFL for tasks assessed/performed Overall Cognitive Status: Within Functional Limits for tasks assessed                                        Exercises Total Joint Exercises Ankle Circles/Pumps: AROM;Both;10 reps Quad Sets: Strengthening;Both;10 reps Gluteal Sets: Strengthening;Both;10 reps Heel Slides: AROM;Right;5 reps Hip ABduction/ADduction: AAROM;Left;5 reps Long Arc Quad: AROM;Both;5 reps;10 reps Knee Flexion: AROM;Both;5 reps;10 reps Marching in Standing: AROM;Both;10 reps;Standing Other  Exercises Other Exercises: Pt able to recall 3/3 hip precautions with min verbal cues given during functional tasks to maintain precautions with mobility  Other Exercises: HEP education and review per handout Other Exercises: Pt educated in and demonstrated 3/3 hip precautions during sit to stand and stand to sit transfers. Other Exercises: Pt educated in breathing techniques including pursed lip  breathing to breathe through pain.    General Comments        Pertinent Vitals/Pain Pain Assessment: No/denies pain Pain Location: No pain at rest in recliner in L hip. 4 pain level in lower back. 10 pain level during repostioning of hips in bed at end of session. Pain Descriptors / Indicators: Sore Pain Intervention(s): Repositioned;Monitored during session    Home Living                      Prior Function            PT Goals (current goals can now be found in the care plan section) Acute Rehab PT Goals Patient Stated Goal: to get back to quilting Progress towards PT goals: Progressing toward goals    Frequency    BID      PT Plan Current plan remains appropriate    Co-evaluation              AM-PAC PT "6 Clicks" Daily Activity  Outcome Measure                   End of Session Equipment Utilized During Treatment: Gait belt Activity Tolerance: Patient tolerated treatment well Patient left: in bed;with bed alarm set;with SCD's reapplied;with family/visitor present;with call bell/phone within reach Nurse Communication: Mobility status PT Visit Diagnosis: History of falling (Z91.81);Muscle weakness (generalized) (M62.81);Other abnormalities of gait and mobility (R26.89)     Time: 1431-1510 PT Time Calculation (min) (ACUTE ONLY): 39 min  Charges:  $Gait Training: 8-22 mins $Therapeutic Exercise: 8-22 mins $Therapeutic Activity: 8-22 mins                     Lisa Valencia PT, DPT 05/03/18, 4:49 PM

## 2018-05-03 NOTE — Progress Notes (Signed)
   Subjective: 1 Day Post-Op Procedure(s) (LRB): TOTAL HIP ARTHROPLASTY (Left) Patient reports pain as moderate.   Patient is well, and has had no acute complaints or problems We will start therapy today.  Plan is to go Rehab after hospital stay. no nausea and no vomiting Patient denies any chest pains or shortness of breath. Objective: Vital signs in last 24 hours: Temp:  [97.1 F (36.2 C)-99 F (37.2 C)] 98.1 F (36.7 C) (10/17 0355) Pulse Rate:  [68-115] 98 (10/17 0355) Resp:  [10-26] 16 (10/17 0355) BP: (101-165)/(57-141) 112/63 (10/17 0355) SpO2:  [89 %-97 %] 91 % (10/17 0355) FiO2 (%):  [2 %] 2 % (10/16 1618) Weight:  [127.6 kg-127.9 kg] 127.9 kg (10/16 1618) well approximated incision Heels are non tender and elevated off the bed using rolled towels Intake/Output from previous day: 10/16 0701 - 10/17 0700 In: 2372.7 [P.O.:360; I.V.:1702.8; IV Piggyback:309.9] Out: 1540 [Urine:1400; Drains:40; Blood:100] Intake/Output this shift: No intake/output data recorded.  No results for input(s): HGB in the last 72 hours. No results for input(s): WBC, RBC, HCT, PLT in the last 72 hours. No results for input(s): NA, K, CL, CO2, BUN, CREATININE, GLUCOSE, CALCIUM in the last 72 hours. No results for input(s): LABPT, INR in the last 72 hours.  EXAM General - Patient is Alert, Appropriate and Oriented Extremity - Neurologically intact Neurovascular intact Sensation intact distally Intact pulses distally Dorsiflexion/Plantar flexion intact No cellulitis present Compartment soft Dressing - dressing C/D/I Motor Function - intact, moving foot and toes well on exam.    Past Medical History:  Diagnosis Date  . Anxiety   . Arthritis   . Depression   . Diverticulosis of intestine 2010   Lisa Valencia  . Esophagitis, reflux 2010    EGD by iftkihar, managed wiht dexilant  . GERD (gastroesophageal reflux disease)   . Impaired fasting glucose 01/25/2012   Needs eye exam.       Assessment/Plan: 1 Day Post-Op Procedure(s) (LRB): TOTAL HIP ARTHROPLASTY (Left) Active Problems:   Status post total replacement of hip  Estimated body mass index is 40.46 kg/m as calculated from the following:   Height as of this encounter: 5\' 10"  (1.778 m).   Weight as of this encounter: 127.9 kg. Advance diet Up with therapy D/C IV fluids Plan for discharge tomorrow Discharge to SNF  Labs: None DVT Prophylaxis - Lovenox, Foot Pumps and TED hose Weight-Bearing as tolerated to left leg D/C O2 and Pulse OX and try on Room Air Begin working on bowel movement  Lisa Valencia R. [place] Lisa Valencia 05/03/2018, 7:17 AM

## 2018-05-03 NOTE — Evaluation (Signed)
Physical Therapy Evaluation Patient Details Name: Lisa Valencia MRN: [id number] DOB: 05-30-1961 Today's Date: 05/03/2018   History of Present Illness  Pt is a 57 yo F diagnosed with degenerative arthrosis of the left hip and is now s/p elective L THA.  PMH includes GERD, anxiety, and depression.      Clinical Impression  Pt presents with deficits in strength, transfers, mobility, gait, balance, and activity tolerance.  Pt required min A with bed mobility tasks and CGA with transfers from an elevated surface.  Pt was only able to amb around 5' with a RW with step-to, antalgic gait pattern.  Pt training provided with cues for proper sequencing during functional mobility tasks to ensure compliance with L post hip precautions with pt demonstrating good carryover.  Overall pt was quite limited functionally and will have no assistance during the days at home upon discharge.  Pt would not be safe to discharge home alone to her prior living situation at this time.  Pt will benefit from PT services in a SNF setting upon discharge to safely address above deficits for decreased caregiver assistance and eventual return to PLOF.      Follow Up Recommendations SNF    Equipment Recommendations  None recommended by PT    Recommendations for Other Services       Precautions / Restrictions Precautions Precautions: Posterior Hip Precaution Booklet Issued: Yes (comment) Restrictions Weight Bearing Restrictions: Yes LLE Weight Bearing: Weight bearing as tolerated      Mobility  Bed Mobility Overal bed mobility: Needs Assistance Bed Mobility: Supine to Sit;Sit to Supine     Supine to sit: Min assist Sit to supine: Min assist   General bed mobility comments: Min A for LLE in and out of bed with min verbal cues for sequencing  Transfers Overall transfer level: Needs assistance Equipment used: Rolling walker (2 wheeled) Transfers: Sit to/from Stand Sit to Stand: Min guard;From elevated  surface         General transfer comment: Mod verbal cues for proper sequencing to maintain hip precautions  Ambulation/Gait Ambulation/Gait assistance: Min guard Gait Distance (Feet): 5 Feet Assistive device: Rolling walker (2 wheeled) Gait Pattern/deviations: Step-to pattern;Trunk flexed;Decreased stance time - left;Antalgic Gait velocity: decreased   General Gait Details: Limited activity tolerance with gait with training/cues given for proper sequencing during L turns to prevent CKC L hip IR  Stairs            Wheelchair Mobility    Modified Rankin (Stroke Patients Only)       Balance Overall balance assessment: Mild deficits observed, not formally tested Sitting-balance support: Feet supported;No upper extremity supported Sitting balance-Leahy Scale: Good     Standing balance support: Bilateral upper extremity supported Standing balance-Leahy Scale: Fair Standing balance comment: Heavy reliance on UE support during standing                             Pertinent Vitals/Pain Pain Assessment: 0-10 Pain Score: 3  Pain Location: L hip Pain Descriptors / Indicators: Sore Pain Intervention(s): Premedicated before session;Monitored during session    Home Living Family/patient expects to be discharged to:: Private residence Living Arrangements: Spouse/significant other Available Help at Discharge: Family;Available PRN/intermittently Type of Home: House Home Access: Stairs to enter Entrance Stairs-Rails: Right;Left;Can reach both Entrance Stairs-Number of Steps: 2 Home Layout: One level Home Equipment: Cane - single point;Walker - 2 wheels;Bedside commode Additional Comments: Discussed pet safety mgt strategies  Prior Function Level of Independence: Needs assistance   Gait / Transfers Assistance Needed: Pt ambulates with SPC in community and recently needed it for mobility in the home due to pain in hip and back.  Pt reports 2 falls in the last  year  ADL's / Homemaking Assistance Needed: Husband assists with donning/doffing socks and shoes, use of AE for UB and LB bathing. Pt will be taking sponge baths during recovery.         Hand Dominance        Extremity/Trunk Assessment   Upper Extremity Assessment Upper Extremity Assessment: Defer to OT evaluation    Lower Extremity Assessment Lower Extremity Assessment: Generalized weakness;LLE deficits/detail LLE Deficits / Details: Left hip flex <3/5 LLE: Unable to fully assess due to pain       Communication   Communication: No difficulties  Cognition Arousal/Alertness: Awake/alert Behavior During Therapy: WFL for tasks assessed/performed Overall Cognitive Status: Within Functional Limits for tasks assessed                                        General Comments      Exercises Total Joint Exercises Ankle Circles/Pumps: AROM;Both;10 reps Quad Sets: Strengthening;Both;10 reps Gluteal Sets: Strengthening;Both;10 reps Heel Slides: AROM;Right;5 reps Hip ABduction/ADduction: AAROM;Left;5 reps Long Arc Quad: AROM;Both;5 reps;10 reps Knee Flexion: AROM;Both;5 reps;10 reps Marching in Standing: AROM;Both;10 reps;Standing Other Exercises Other Exercises: Posterior hip precaution education provided Other Exercises: HEP education and review per handout Other Exercises: Training on proper sequenicng during bed mob, transfers, and amb to ensure hip precaution compliance Other Exercises: Pt educated in breathing techniques including pursed lip breathing to breathe through pain.   Assessment/Plan    PT Assessment Patient needs continued PT services  PT Problem List Decreased strength;Decreased activity tolerance;Decreased balance;Decreased knowledge of use of DME;Decreased mobility;Decreased knowledge of precautions       PT Treatment Interventions DME instruction;Gait training;Stair training;Functional mobility training;Balance training;Therapeutic  exercise;Therapeutic activities;Patient/family education    PT Goals (Current goals can be found in the Care Plan section)  Acute Rehab PT Goals Patient Stated Goal: To get out of bed and walk PT Goal Formulation: With patient Time For Goal Achievement: 05/16/18 Potential to Achieve Goals: Good    Frequency BID   Barriers to discharge Inaccessible home environment;Decreased caregiver support Pt will be alone during the day    Co-evaluation               AM-PAC PT "6 Clicks" Daily Activity  Outcome Measure Difficulty turning over in bed (including adjusting bedclothes, sheets and blankets)?: Unable Difficulty moving from lying on back to sitting on the side of the bed? : Unable Difficulty sitting down on and standing up from a chair with arms (e.g., wheelchair, bedside commode, etc,.)?: Unable Help needed moving to and from a bed to chair (including a wheelchair)?: A Little Help needed walking in hospital room?: A Lot Help needed climbing 3-5 steps with a railing? : Total 6 Click Score: 9    End of Session Equipment Utilized During Treatment: Gait belt Activity Tolerance: Patient tolerated treatment well Patient left: in chair;with SCD's reapplied;with chair alarm set;with call bell/phone within reach;Other (comment)(Pillows placed to prevent L hip adduction) Nurse Communication: Mobility status PT Visit Diagnosis: History of falling (Z91.81);Muscle weakness (generalized) (M62.81);Other abnormalities of gait and mobility (R26.89)    Time: 4097-3532 PT Time Calculation (min) (ACUTE ONLY): 50 min  Charges:   PT Evaluation $PT Eval Low Complexity: 1 Low PT Treatments $Therapeutic Exercise: 8-22 mins $Therapeutic Activity: 8-22 mins       D. Royetta Asal PT, DPT 05/03/18, 12:44 PM

## 2018-05-03 NOTE — Progress Notes (Signed)
Foley d/c'd per order.

## 2018-05-03 NOTE — Progress Notes (Signed)
Patient attempted to dangle on side of bed.  Patient unable with two staff due to pain and lack of effort.  Will attempt again later.

## 2018-05-04 NOTE — Progress Notes (Signed)
Patient is medically stable for D/C to [facility] today. Per [facility] admissions coordinator at [facility] she received a call from [facility] case manager this morning stating that she will approve patient for SNF today. Per Lovena Le patient can come to [facility] today to room 204-B. RN will call report at [phone] and arrange EMS for transport. Clinical Education officer, museum (CSW) sent D/C orders to [employer] [facility] via [employer]. Patient is aware of above. Patient's mother Golden Circle is at bedside and aware of above. CSW contacted patient's husband Shanon Brow and made him aware of above. Please reconsult if future social work needs arise. CSW signing off.   [employer], LCSW [phone]

## 2018-05-04 NOTE — Progress Notes (Signed)
Physical Therapy Treatment Patient Details Name: Lisa Valencia MRN: [id number] DOB: Mar 28, 1961 Today's Date: 05/04/2018    History of Present Illness Pt is a 57 yo F diagnosed with degenerative arthrosis of the left hip and is now s/p elective L THA.  PMH includes GERD, anxiety, and depression.      PT Comments    Pt is making good progress towards goals and is safe to dc to SNF this date. Pt with low BP, however during session, assessed vitals with BP at 114/70 and no symptoms of dizziness. Able to ambulate around room and to bathroom with slight assist and cues for hip precautions. Good endurance with there-ex. Still demonstrates deficits with strength/mobility. Motivated to participate in there-ex. Will continue to progress.   Follow Up Recommendations  SNF     Equipment Recommendations  None recommended by PT    Recommendations for Other Services       Precautions / Restrictions Precautions Precautions: Posterior Hip Precaution Booklet Issued: Yes (comment) Restrictions Weight Bearing Restrictions: Yes LLE Weight Bearing: Weight bearing as tolerated    Mobility  Bed Mobility Overal bed mobility: Needs Assistance Bed Mobility: Supine to Sit     Supine to sit: Min assist     General bed mobility comments: needs assist for L LE and lowering to ground. Once seated at EOB, needs reminders for maintaining correct WBing precautions.  Transfers Overall transfer level: Needs assistance Equipment used: Rolling walker (2 wheeled) Transfers: Sit to/from Stand Sit to Stand: Min guard         General transfer comment: bed elevated prior to standing. Once upright, able to stand with RW and equal Wbing. Safe technique  Ambulation/Gait Ambulation/Gait assistance: Min guard Gait Distance (Feet): 50 Feet Assistive device: Rolling walker (2 wheeled) Gait Pattern/deviations: Step-to pattern;Decreased stance time - left;Antalgic;Trunk flexed     General Gait Details: slow  gait speed and ambulated around room and in bathroom. Occasional cues for hip precautions during turns   Marine scientist Rankin (Stroke Patients Only)       Balance                                            Cognition Arousal/Alertness: Awake/alert Behavior During Therapy: WFL for tasks assessed/performed Overall Cognitive Status: Within Functional Limits for tasks assessed                                        Exercises Other Exercises Other Exercises: Seated ther-ex performed on L LE including ankle pumps, quad sets, glut sets, hip abd/add, LAQ, and SAQ. All ther-ex performed x 12 reps with cga/occasional min assist. Other Exercises: Pt perform tolieting and bathroom activities with cga and occasional cues for sequencing and maintaining correct hip precautions.    General Comments        Pertinent Vitals/Pain Pain Assessment: 0-10 Pain Score: 8  Pain Location: L hip Pain Descriptors / Indicators: Discomfort;Operative site guarding Pain Intervention(s): Limited activity within patient's tolerance;Patient requesting pain meds-RN notified    Home Living                      Prior Function  PT Goals (current goals can now be found in the care plan section) Acute Rehab PT Goals Patient Stated Goal: to get back to quilting PT Goal Formulation: With patient Time For Goal Achievement: 05/16/18 Potential to Achieve Goals: Good Progress towards PT goals: Progressing toward goals    Frequency    BID      PT Plan Current plan remains appropriate    Co-evaluation              AM-PAC PT "6 Clicks" Daily Activity  Outcome Measure  Difficulty turning over in bed (including adjusting bedclothes, sheets and blankets)?: Unable Difficulty moving from lying on back to sitting on the side of the bed? : Unable Difficulty sitting down on and standing up from a chair  with arms (e.g., wheelchair, bedside commode, etc,.)?: Unable Help needed moving to and from a bed to chair (including a wheelchair)?: A Little Help needed walking in hospital room?: A Little Help needed climbing 3-5 steps with a railing? : A Lot 6 Click Score: 11    End of Session Equipment Utilized During Treatment: Gait belt Activity Tolerance: Patient tolerated treatment well Patient left: in chair;with chair alarm set Nurse Communication: Mobility status PT Visit Diagnosis: History of falling (Z91.81);Muscle weakness (generalized) (M62.81);Other abnormalities of gait and mobility (R26.89)     Time: 7494-4967 PT Time Calculation (min) (ACUTE ONLY): 23 min  Charges:  $Gait Training: 8-22 mins $Therapeutic Exercise: 8-22 mins                     Greggory Stallion, PT, DPT [phone]    Kayler Buckholtz 05/04/2018, 10:31 AM

## 2018-05-04 NOTE — Progress Notes (Signed)
   Subjective: 2 Days Post-Op Procedure(s) (LRB): TOTAL HIP ARTHROPLASTY (Left) Patient reports pain as mild.   Patient is well, and has had no acute complaints or problems She did fair with physical therapy yesterday secondary to deconditioning. Plan is to go Rehab after hospital stay. no nausea and no vomiting Patient denies any chest pains or shortness of breath. Objective: Vital signs in last 24 hours: Temp:  [98.1 F (36.7 C)-99.4 F (37.4 C)] 99.4 F (37.4 C) (10/17 2309) Pulse Rate:  [90-113] 113 (10/17 2309) Resp:  [16-19] 19 (10/17 2309) BP: (101-115)/(51-57) 115/51 (10/17 2309) SpO2:  [93 %-100 %] 93 % (10/17 2309) well approximated incision Heels are non tender and elevated off the bed using rolled towels Intake/Output from previous day: 10/17 0701 - 10/18 0700 In: 360 [P.O.:360] Out: 10 [Drains:10] Intake/Output this shift: No intake/output data recorded.  No results for input(s): HGB in the last 72 hours. No results for input(s): WBC, RBC, HCT, PLT in the last 72 hours. No results for input(s): NA, K, CL, CO2, BUN, CREATININE, GLUCOSE, CALCIUM in the last 72 hours. No results for input(s): LABPT, INR in the last 72 hours.  EXAM General - Patient is Alert, Appropriate and Oriented Extremity - Neurologically intact Neurovascular intact Sensation intact distally Intact pulses distally Dorsiflexion/Plantar flexion intact No cellulitis present Compartment soft Dressing - dressing C/D/I Motor Function - intact, moving foot and toes well on exam.    Past Medical History:  Diagnosis Date  . Anxiety   . Arthritis   . Depression   . Diverticulosis of intestine 2010   iftikhar  . Esophagitis, reflux 2010    EGD by iftkihar, managed wiht dexilant  . GERD (gastroesophageal reflux disease)   . Impaired fasting glucose 01/25/2012   Needs eye exam.      Assessment/Plan: 2 Days Post-Op Procedure(s) (LRB): TOTAL HIP ARTHROPLASTY (Left) Active Problems:  Status post total replacement of hip  Estimated body mass index is 40.46 kg/m as calculated from the following:   Height as of this encounter: 5\' 10"  (1.778 m).   Weight as of this encounter: 127.9 kg. Up with therapy Discharge to SNF  Labs: None DVT Prophylaxis - Lovenox, Foot Pumps and TED hose Weight-Bearing as tolerated to left leg Hemovac discontinued today.  Into the drain appeared to be intact. Please change dressing prior to patient being discharged  [place] 05/04/2018, 7:28 AM

## 2018-05-04 NOTE — Clinical Social Work Placement (Signed)
   CLINICAL SOCIAL WORK PLACEMENT  NOTE  Date:  05/04/2018  Patient Details  Name: Lisa Valencia MRN: [id number] Date of Birth: Jun 18, 1961  Clinical Social Work is seeking post-discharge placement for this patient at the [place] level of care (*CSW will initial, date and re-position this form in  chart as items are completed):  Yes   Patient/family provided with [place] Work Department's list of facilities offering this level of care within the geographic area requested by the patient (or if unable, by the patient's family).  Yes   Patient/family informed of their freedom to choose among providers that offer the needed level of care, that participate in Medicare, Medicaid or managed care program needed by the patient, have an available bed and are willing to accept the patient.  Yes   Patient/family informed of [place]'s ownership interest in [facility] and [facility], as well as of the fact that they are under no obligation to receive care at these facilities.  PASRR submitted to EDS on 05/02/18     PASRR number received on 05/02/18     Existing PASRR number confirmed on       FL2 transmitted to all facilities in geographic area requested by pt/family on 05/02/18     FL2 transmitted to all facilities within larger geographic area on       Patient informed that his/her managed care company has contracts with or will negotiate with certain facilities, including the following:        Yes   Patient/family informed of bed offers received.  Patient chooses bed at [facility] )     Physician recommends and patient chooses bed at      Patient to be transferred to [facility] ) on 05/04/18.  Patient to be transferred to facility by [facility] )     Patient family notified on 05/04/18 of transfer.  Name of family member notified:  (Patient's husband Shanon Brow is aware of D/C today. )     PHYSICIAN        Additional Comment:    _______________________________________________ Kennidi Yoshida, Veronia Beets, LCSW 05/04/2018, 11:15 AM

## 2018-05-04 NOTE — Progress Notes (Signed)
PIV removed and EMS call for transport.   Bethann Punches, RN

## 2018-05-04 NOTE — Progress Notes (Signed)
Called in report to [place] at [place].   Bethann Punches, RN

## 2018-05-05 LAB — SURGICAL PATHOLOGY

## 2018-05-06 IMAGING — XA Imaging study
2 series · 2 of 2 positions shown · non-contrast
Comparison: none

CLINICAL DATA: Lumbosacral spondylosis without myelopathy. Left
groin and left lower extremity pain. Moderate though relatively
short-lived relief following the first epidural injection last
month. Repeat injection requested at L3-4.

[Series 1: ortho standard · 1 of 1 slices shown (1 of 2)]
[im 1/1]
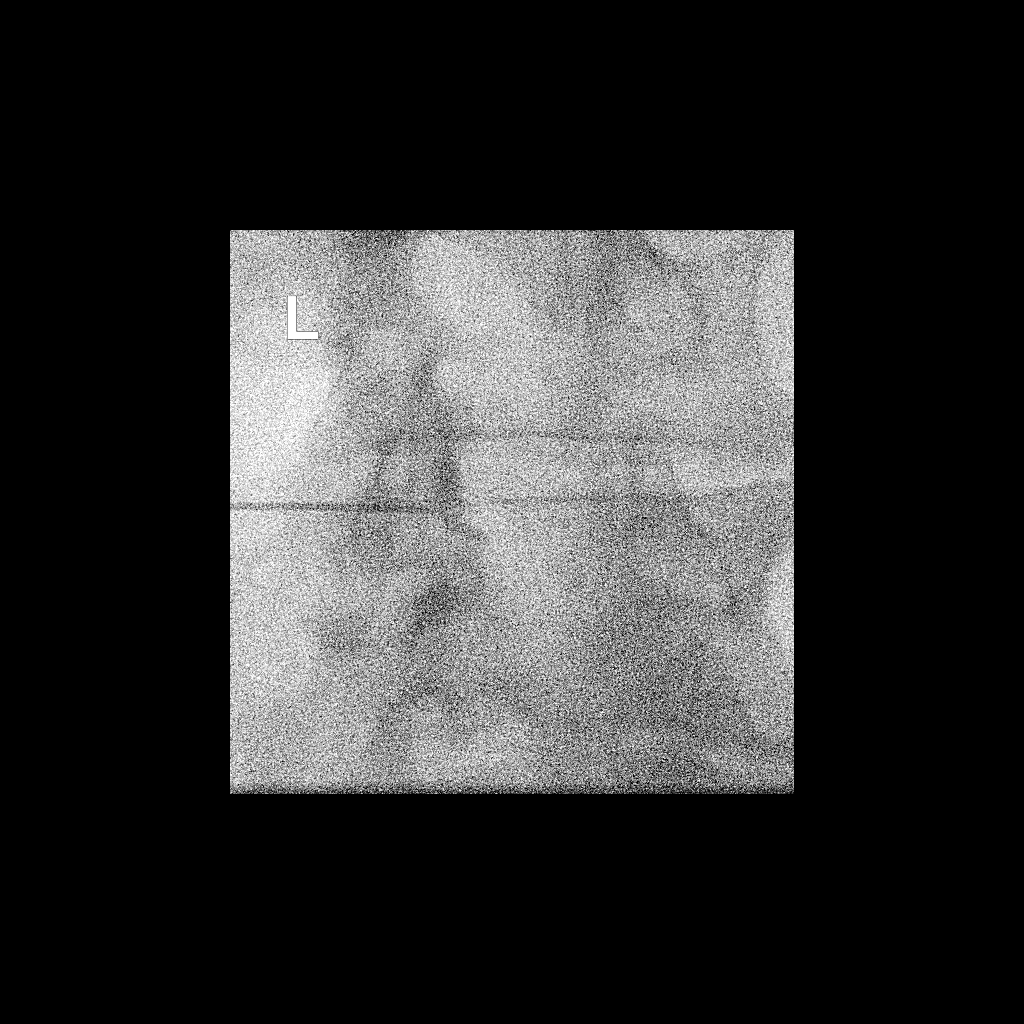

[Series 2: ortho standard · 1 of 1 slices shown (2 of 2)]
[im 1/1]
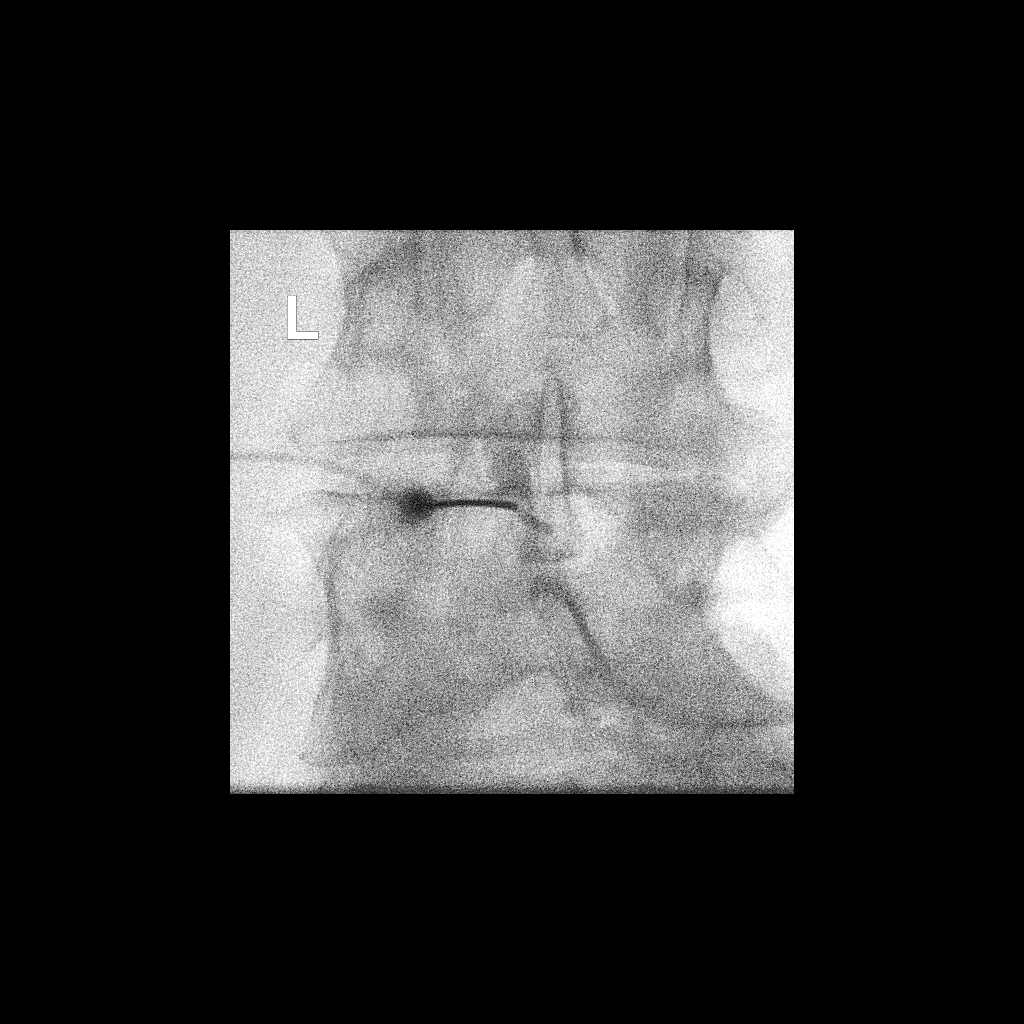

[2 of 2 positions shown; findings below may reference images not displayed]

FLUOROSCOPY TIME:  Radiation Exposure Index (as provided by the
fluoroscopic device): 14.65 microGray*m^2

Fluoroscopy Time (in minutes and seconds):  7 seconds

PROCEDURE:
The procedure, risks, benefits, and alternatives were explained to
the patient. Questions regarding the procedure were encouraged and
answered. The patient understands and consents to the procedure.

LUMBAR EPIDURAL INJECTION:

An interlaminar approach was performed on the left at L3-4. The
overlying skin was cleansed and anesthetized. A 20 gauge epidural
needle was advanced using loss-of-resistance technique. A 3.5 inch
needle which used, which required a indenting the skin.

DIAGNOSTIC EPIDURAL INJECTION:

Injection of Isovue-M 200 shows a good epidural pattern with spread
above and below the level of needle placement, primarily on the
left. No vascular opacification is seen.

THERAPEUTIC EPIDURAL INJECTION:

120 mg of Depo-Medrol mixed with 3 mL of 1% lidocaine were
instilled. The procedure was well-tolerated, and the patient was
discharged thirty minutes following the injection in good condition.

COMPLICATIONS:
None
IMPRESSION: Technically successful lumbar interlaminar epidural injection on the
left at L3-4.

## 2018-05-07 ENCOUNTER — Encounter: Payer: Managed Care, Other (non HMO) | Admitting: Internal Medicine

## 2018-05-10 ENCOUNTER — Encounter: Payer: Self-pay | Admitting: Adult Health

## 2018-05-10 ENCOUNTER — Non-Acute Institutional Stay (SKILLED_NURSING_FACILITY): Payer: Managed Care, Other (non HMO) | Admitting: Adult Health

## 2018-05-10 DIAGNOSIS — Z720 Tobacco use: Secondary | ICD-10-CM | POA: Diagnosis not present

## 2018-05-10 DIAGNOSIS — M1612 Unilateral primary osteoarthritis, left hip: Secondary | ICD-10-CM

## 2018-05-10 DIAGNOSIS — E7800 Pure hypercholesterolemia, unspecified: Secondary | ICD-10-CM

## 2018-05-10 DIAGNOSIS — R600 Localized edema: Secondary | ICD-10-CM | POA: Diagnosis not present

## 2018-05-10 DIAGNOSIS — K2100 Gastro-esophageal reflux disease with esophagitis, without bleeding: Secondary | ICD-10-CM

## 2018-05-10 NOTE — Progress Notes (Signed)
Location:  [facility] Room Number: 204-B Place of Service:  SNF (31) Provider:  Durenda Age, NP  Patient Care Team: Crecencio Mc, MD as PCP - General (Internal Medicine)  Extended Emergency Contact Information Primary Emergency Contact: Bourke,David Address: [address], [place] [address] Johnnette Litter of [place] Phone: [phone] Relation: Spouse Secondary Emergency Contact: Hessie Knows Address: [place] [address] [place], [place] [address] Johnnette Litter of [place] Phone: [phone] Mobile Phone: [phone] Relation: Sister  Code Status:  Full Code  Goals of care: Advanced Directive information Advanced Directives 05/02/2018  Does Patient Have a Medical Advance Directive? Yes  Type of Advance Directive Living will  Does patient want to make changes to medical advance directive? No - Patient declined  Copy of [place] in Chart? No - copy requested  Would patient like information on creating a medical advance directive? No - Patient declined     Chief Complaint  Patient presents with  . Discharge Note    Patient is seen for a discharge visit, to discharge home on 05/12/18.    HPI:  Pt is a 57 y.o. female seen today for discharge.  She is discharging home on 05/12/18 with home health PT and OT services.    She has been admitted to [facility] on 05/04/18 from a recent hospitalization due to primary osteoarthritis of left hip for which she had left total hip arthroplasty. She has a PMH of axniety/depression, diverticulosis, GERD, and arthritis.    Patient was admitted to this facility for short-term rehabilitation after the patient's recent hospitalization.  Patient has completed SNF rehabilitation and therapy has cleared the patient for discharge.   Past Medical History:  Diagnosis Date  . Anxiety   . Arthritis   . Depression   .  Diverticulosis of intestine 2010   iftikhar  . Esophagitis, reflux 2010    EGD by iftkihar, managed wiht dexilant  . GERD (gastroesophageal reflux disease)   . Impaired fasting glucose 01/25/2012   Needs eye exam.     Past Surgical History:  Procedure Laterality Date  . ABDOMINAL HYSTERECTOMY  2009   supracervical   . APPENDECTOMY    . COLONOSCOPY WITH PROPOFOL N/A 06/05/2015   Procedure: COLONOSCOPY WITH PROPOFOL;  Surgeon: Lucilla Lame, MD;  Location: [facility];  Service: Endoscopy;  Laterality: N/A;  Pt requests early. Pt for antibiotic pre-procedure  CECAL PROTRUSION BX. / WITH CLIP LOT # [id number] RESOLUTION 360 EXP 03/03/2016 SIGMOID POLYP  . JOINT REPLACEMENT Right 2012   hip, [name]  . TOTAL HIP ARTHROPLASTY Left 05/02/2018   Procedure: TOTAL HIP ARTHROPLASTY;  Surgeon: Dereck Leep, MD;  Location: [facility];  Service: Orthopedics;  Laterality: Left;    Allergies  Allergen Reactions  . Sulfa Antibiotics Swelling and Other (See Comments)    Angioedema THROAT AND TONGUE HAD SWELLING  . Codeine Sulfate Nausea Only  . Fenofibrate Other (See Comments)    Muscle aches  . Pseudoephedrine Hcl Palpitations  . Wellbutrin [Bupropion] Hives    Outpatient Encounter Medications as of 05/10/2018  Medication Sig  . celecoxib (CELEBREX) 200 MG capsule TAKE 1 CAPSULE BY MOUTH TWICE DAILY  . citalopram (CELEXA) 40 MG tablet Take 1 tablet (40 mg total) by mouth daily.  . diclofenac sodium (VOLTAREN) 1 % GEL Apply 2 g topically  4 (four) times daily.  Marland Kitchen enoxaparin (LOVENOX) 30 MG/0.3ML injection Inject 0.3 mLs (30 mg total) into the skin every 12 (twelve) hours.  Marland Kitchen loratadine (CLARITIN) 10 MG tablet Take 10 mg by mouth daily.  . nicotine (NICODERM CQ - DOSED IN MG/24 HOURS) 21 mg/24hr patch Place 21 mg onto the skin daily.  . Omega-3 Fatty Acids (FISH OIL) 1000 MG CAPS Take 1,000 mg by mouth daily.   Marland Kitchen oxyCODONE (OXY IR/ROXICODONE) 5 MG immediate release tablet Take 1  tablet (5 mg total) by mouth every 4 (four) hours as needed for moderate pain (pain score 4-6).  . pantoprazole (PROTONIX) 40 MG tablet TAKE ONE TABLET BY MOUTH TWICE DAILY  . polyethylene glycol (MIRALAX / GLYCOLAX) packet Take 17 g by mouth daily.  Marland Kitchen senna (SENOKOT) 8.6 MG TABS tablet Take 1 tablet by mouth 2 (two) times daily.  . simvastatin (ZOCOR) 20 MG tablet TAKE ONE TABLET BY MOUTH AT BEDTIME  . spironolactone (ALDACTONE) 50 MG tablet Take 1 tablet (50 mg total) by mouth daily.  . traMADol (ULTRAM) 50 MG tablet Take 1-2 tablets (50-100 mg total) by mouth every 4 (four) hours as needed for moderate pain.  . [DISCONTINUED] HYDROcodone-acetaminophen (NORCO) 10-325 MG tablet Take 1 tablet by mouth 2 (two) times daily as needed.  . [DISCONTINUED] traMADol (ULTRAM) 50 MG tablet TAKE 1 TO 2 TABLETS BY MOUTH EVERY 6 HOURS AS NEEDED FOR SEVERE BACK PAIN. MAX OF 6 PER DAY.   No facility-administered encounter medications on file as of 05/10/2018.     Review of Systems  GENERAL: No change in appetite, no fatigue, no weight changes, no fever, chills or weakness MOUTH and THROAT: Denies oral discomfort, gingival pain or bleeding, pain from teeth or hoarseness   RESPIRATORY: no cough, SOB, DOE, wheezing, hemoptysis CARDIAC: No chest pain, edema or palpitations GI: No abdominal pain, diarrhea, constipation, heart burn, nausea or vomiting GU: Denies dysuria, frequency, hematuria, incontinence, or discharge PSYCHIATRIC: Denies feelings of depression or anxiety. No report of hallucinations, insomnia, paranoia, or agitation   Immunization History  Administered Date(s) Administered  . Influenza Split 03/31/2014, 04/23/2015  . Influenza,inj,Quad PF,6+ Mos 05/04/2018  . Influenza-Unspecified 06/19/2017  . Tdap 07/01/2010   Pertinent  Health Maintenance Due  Topic Date Due  . FOOT EXAM  01/04/1971  . OPHTHALMOLOGY EXAM  01/04/1971  . URINE MICROALBUMIN  03/17/2018  . HEMOGLOBIN A1C  10/19/2018    . PAP SMEAR  02/01/2019  . MAMMOGRAM  04/26/2019  . COLONOSCOPY  06/13/2025  . INFLUENZA VACCINE  Completed    Vitals:   05/10/18 0837  BP: 116/67  Pulse: 82  Resp: 16  Temp: 98.8 F (37.1 C)  TempSrc: Oral  SpO2: 100%  Weight: 285 lb 14.4 oz (129.7 kg)  Height: 5\' 10"  (1.778 m)   Body mass index is 41.02 kg/m.  Physical Exam  GENERAL APPEARANCE: Well nourished. In no acute distress. Morbidly obese SKIN:  Left hip surgical incision is covered with honey-combed dressing, dry and no erythema, bruising on left hip MOUTH and THROAT: Lips are without lesions. Oral mucosa is moist and without lesions. Tongue is normal in shape, size, and color and without lesions RESPIRATORY: Breathing is even & unlabored, BS CTAB CARDIAC: RRR, no murmur,no extra heart sounds, no edema GI: Abdomen soft, normal BS, no masses, no tenderness EXTREMITIES:  Able to move X 4 extremities NEUROLOGICAL: There is no tremor. Speech is clear, Alert and oriented X 3. PSYCHIATRIC:  Affect and behavior are  appropriate  Labs reviewed: Recent Labs    09/21/17 0907 01/29/18 1120 04/19/18 1228  NA 140 139 141  K 4.5 4.2 4.4  CL 103 103 101  CO2 31 26 27   GLUCOSE 99 105* 99  BUN 15 15 14   CREATININE 0.78 0.81 0.87  CALCIUM 9.9 9.8 9.6   Recent Labs    09/21/17 0907 01/29/18 1120 04/19/18 1228  AST 16 21 31   ALT 22 34 47*  ALKPHOS 42 41 42  BILITOT 0.4 0.4 0.6  PROT 7.0 7.4 7.7  ALBUMIN 4.2 4.5 4.3   Recent Labs    04/19/18 1228  WBC 7.0  NEUTROABS 4.4  HGB 15.6  HCT 44.3  MCV 86.0  PLT 194   Lab Results  Component Value Date   TSH 0.76 02/01/2016   Lab Results  Component Value Date   HGBA1C 5.8 (H) 04/19/2018   Lab Results  Component Value Date   CHOL 164 09/21/2017   HDL 39.50 09/21/2017   LDLCALC 160 (H) 05/17/2012   LDLDIRECT 86.0 09/21/2017   TRIG 293.0 (H) 09/21/2017   CHOLHDL 4 09/21/2017    Significant Diagnostic Results in last 30 days:  Dg Hip Port Unilat With  Pelvis 1v Left  Result Date: 05/02/2018 CLINICAL DATA:  Status post left hip arthroplasty. EXAM: DG HIP (WITH OR WITHOUT PELVIS) 1V PORT LEFT COMPARISON:  None. FINDINGS: The left femoral and acetabular components appear to be well situated. No fracture or dislocation is noted. Expected postoperative changes are seen in the surrounding soft tissues. IMPRESSION: Status post left total hip arthroplasty. Electronically Signed   By: Marijo Conception, M.D.   On: 05/02/2018 15:50    Assessment/Plan  1. Primary osteoarthritis of left hip -S/P total hip arthroplasty on 05/02/2018, continue tramadol 50 mg 1-2 tabs every 4 hours as needed and oxycodone 5 mg 1 tab every 4 hours as needed for pain, Celebrex 200 mg 1 tab twice a day, follow-up with orthopedics, Lovenox 30 mg subcu every 12 hours (end date 05/19/18) for DVT prophylaxis, for home health PT and OT for therapeutic and strengthening exercises, fall precautions   2. Esophagitis, reflux -continue pantoprazole DR 40 mg 1 tab twice a day   3. Edema of lower extremity -continue with Spironolactone 50 mg 1 tab daily   4. Tobacco abuse -requested to have Chantix, will discontinue nicotine patch   5.  Hyperlipidemia -continue simvastatin 20 mg 1 tab nightly    I have filled out patient's discharge paperwork and written prescriptions.  Patient will receive home health PT and OT..  DME provided:  None  Total discharge time: Less than 30 minutes  Discharge time involved coordination of the discharge process with social worker, nursing staff and therapy department. Medical justification for home health services verified.    Durenda Age, NP [facility] [phone] (Monday-Friday 8:00 a.m. - 5:00 p.m.) [phone] (after hours)'

## 2018-06-05 ENCOUNTER — Other Ambulatory Visit: Payer: Self-pay | Admitting: Internal Medicine

## 2018-06-06 ENCOUNTER — Other Ambulatory Visit: Payer: Self-pay | Admitting: Internal Medicine

## 2018-06-06 MED ORDER — TRAMADOL HCL 50 MG PO TABS: ORAL_TABLET | ORAL | 5 refills | Status: DC

## 2018-06-06 MED ORDER — ESTROGENS, CONJUGATED 0.625 MG/GM VA CREA: TOPICAL_CREAM | VAGINAL | 12 refills | Status: DC

## 2018-06-07 NOTE — Telephone Encounter (Signed)
Premarin and tramadol refilled.  Hydrocodone denied,  Oxycodone denied without an office visit.  mychart message sent.

## 2018-06-16 ENCOUNTER — Other Ambulatory Visit: Payer: Self-pay | Admitting: Adult Health

## 2018-06-18 ENCOUNTER — Other Ambulatory Visit: Payer: Self-pay | Admitting: Internal Medicine

## 2018-06-28 DIAGNOSIS — K760 Fatty (change of) liver, not elsewhere classified: Secondary | ICD-10-CM

## 2018-06-28 DIAGNOSIS — F5089 Other specified eating disorder: Secondary | ICD-10-CM

## 2018-06-28 DIAGNOSIS — R7303 Prediabetes: Secondary | ICD-10-CM

## 2018-07-03 ENCOUNTER — Other Ambulatory Visit (INDEPENDENT_AMBULATORY_CARE_PROVIDER_SITE_OTHER): Payer: Managed Care, Other (non HMO)

## 2018-07-03 DIAGNOSIS — F5089 Other specified eating disorder: Secondary | ICD-10-CM

## 2018-07-03 DIAGNOSIS — K760 Fatty (change of) liver, not elsewhere classified: Secondary | ICD-10-CM

## 2018-07-03 DIAGNOSIS — R7303 Prediabetes: Secondary | ICD-10-CM | POA: Diagnosis not present

## 2018-07-03 LAB — COMPREHENSIVE METABOLIC PANEL
ALT: 70 U/L — ABNORMAL HIGH (ref 0–35)
AST: 37 U/L (ref 0–37)
Albumin: 4.7 g/dL (ref 3.5–5.2)
Alkaline Phosphatase: 49 U/L (ref 39–117)
BUN: 12 mg/dL (ref 6–23)
CO2: 28 mEq/L (ref 19–32)
Calcium: 10.1 mg/dL (ref 8.4–10.5)
Chloride: 101 mEq/L (ref 96–112)
Creatinine, Ser: 0.76 mg/dL (ref 0.40–1.20)
GFR: 83.22 mL/min (ref >60.00)
Glucose, Bld: 115 mg/dL — ABNORMAL HIGH (ref 70–99)
Potassium: 4.1 mEq/L (ref 3.5–5.1)
Sodium: 138 mEq/L (ref 135–145)
Total Bilirubin: 0.5 mg/dL (ref 0.2–1.2)
Total Protein: 7.5 g/dL (ref 6.0–8.3)

## 2018-07-03 LAB — LIPID PANEL
Cholesterol: 188 mg/dL (ref 0–200)
HDL: 46.8 mg/dL (ref >39.00)
NonHDL: 141.67
Total CHOL/HDL Ratio: 4
Triglycerides: 266 mg/dL — ABNORMAL HIGH (ref 0.0–149.0)
VLDL: 53.2 mg/dL — ABNORMAL HIGH (ref 0.0–40.0)

## 2018-07-03 LAB — CBC WITH DIFFERENTIAL/PLATELET
Basophils Absolute: 0 10*3/uL (ref 0.0–0.1)
Basophils Relative: 0.7 % (ref 0.0–3.0)
Eosinophils Absolute: 0.2 10*3/uL (ref 0.0–0.7)
Eosinophils Relative: 3.9 % (ref 0.0–5.0)
HCT: 41.9 % (ref 36.0–46.0)
Hemoglobin: 13.8 g/dL (ref 12.0–15.0)
Lymphocytes Relative: 32.9 % (ref 12.0–46.0)
Lymphs Abs: 1.5 10*3/uL (ref 0.7–4.0)
MCHC: 33 g/dL (ref 30.0–36.0)
MCV: 85.5 fl (ref 78.0–100.0)
Monocytes Absolute: 0.3 10*3/uL (ref 0.1–1.0)
Monocytes Relative: 5.5 % (ref 3.0–12.0)
Neutro Abs: 2.6 10*3/uL (ref 1.4–7.7)
Neutrophils Relative %: 57 % (ref 43.0–77.0)
Platelets: 198 10*3/uL (ref 150.0–400.0)
RBC: 4.91 Mil/uL (ref 3.87–5.11)
RDW: 14.3 % (ref 11.5–15.5)
WBC: 4.6 10*3/uL (ref 4.0–10.5)

## 2018-07-03 LAB — LDL CHOLESTEROL, DIRECT: Direct LDL: 111 mg/dL

## 2018-07-03 LAB — IRON,TIBC AND FERRITIN PANEL
%SAT: 23 % (calc) (ref 16–45)
Ferritin: 89 ng/mL (ref 16–232)
Iron: 86 ug/dL (ref 45–160)
TIBC: 372 mcg/dL (calc) (ref 250–450)

## 2018-07-03 LAB — HEMOGLOBIN A1C: Hgb A1c MFr Bld: 5.5 % (ref 4.6–6.5)

## 2018-07-08 ENCOUNTER — Other Ambulatory Visit: Payer: Self-pay | Admitting: Internal Medicine

## 2018-07-08 DIAGNOSIS — K760 Fatty (change of) liver, not elsewhere classified: Secondary | ICD-10-CM

## 2018-07-08 NOTE — Progress Notes (Signed)
p 

## 2018-07-09 ENCOUNTER — Other Ambulatory Visit: Payer: Self-pay | Admitting: Internal Medicine

## 2018-07-09 ENCOUNTER — Other Ambulatory Visit: Payer: Self-pay | Admitting: Adult Health

## 2018-07-12 NOTE — Telephone Encounter (Signed)
Refilled: 06/07/2018 Last OV: 01/29/2018 Next OV: 07/23/2018

## 2018-07-13 ENCOUNTER — Other Ambulatory Visit: Payer: Self-pay

## 2018-07-13 NOTE — Telephone Encounter (Signed)
chantix refilled,  MyChart message sent

## 2018-07-23 ENCOUNTER — Encounter: Payer: Self-pay | Admitting: Internal Medicine

## 2018-07-23 ENCOUNTER — Ambulatory Visit (INDEPENDENT_AMBULATORY_CARE_PROVIDER_SITE_OTHER): Payer: Managed Care, Other (non HMO) | Admitting: Internal Medicine

## 2018-07-23 VITALS — BP 120/86 | HR 88 | Temp 98.0°F | Resp 16 | Ht 70.0 in | Wt 290.4 lb

## 2018-07-23 DIAGNOSIS — M5136 Other intervertebral disc degeneration, lumbar region: Secondary | ICD-10-CM | POA: Diagnosis not present

## 2018-07-23 DIAGNOSIS — E782 Mixed hyperlipidemia: Secondary | ICD-10-CM | POA: Diagnosis not present

## 2018-07-23 DIAGNOSIS — R7303 Prediabetes: Secondary | ICD-10-CM

## 2018-07-23 DIAGNOSIS — K760 Fatty (change of) liver, not elsewhere classified: Secondary | ICD-10-CM | POA: Diagnosis not present

## 2018-07-23 DIAGNOSIS — Z0000 Encounter for general adult medical examination without abnormal findings: Secondary | ICD-10-CM | POA: Diagnosis not present

## 2018-07-23 DIAGNOSIS — M5431 Sciatica, right side: Secondary | ICD-10-CM | POA: Diagnosis not present

## 2018-07-23 LAB — COMPREHENSIVE METABOLIC PANEL
ALT: 102 U/L — ABNORMAL HIGH (ref 0–35)
AST: 50 U/L — ABNORMAL HIGH (ref 0–37)
Albumin: 4.8 g/dL (ref 3.5–5.2)
Alkaline Phosphatase: 55 U/L (ref 39–117)
BUN: 16 mg/dL (ref 6–23)
CO2: 28 mEq/L (ref 19–32)
Calcium: 10.5 mg/dL (ref 8.4–10.5)
Chloride: 99 mEq/L (ref 96–112)
Creatinine, Ser: 0.83 mg/dL (ref 0.40–1.20)
GFR: 75.16 mL/min (ref >60.00)
Glucose, Bld: 108 mg/dL — ABNORMAL HIGH (ref 70–99)
Potassium: 4.4 mEq/L (ref 3.5–5.1)
Sodium: 137 mEq/L (ref 135–145)
Total Bilirubin: 0.3 mg/dL (ref 0.2–1.2)
Total Protein: 7.9 g/dL (ref 6.0–8.3)

## 2018-07-23 MED ORDER — PREDNISONE 10 MG PO TABS: ORAL_TABLET | ORAL | 0 refills | Status: DC

## 2018-07-23 MED ORDER — CELECOXIB 200 MG PO CAPS: 200.0000 mg | ORAL_CAPSULE | Freq: Every day | ORAL | 1 refills | Status: DC

## 2018-07-23 MED ORDER — OXYCODONE HCL 5 MG PO TABS: 5.0000 mg | ORAL_TABLET | Freq: Four times a day (QID) | ORAL | 0 refills | Status: DC | PRN

## 2018-07-23 NOTE — Assessment & Plan Note (Addendum)
Statin therapy was advised and started in 2017.  She has stopped the medication as of one week ago due to elevated liver enzymes , which have been mildly elevated ( < 2 x ULN) since 2013.  Will advise her to resume medication  Lab Results  Component Value Date   CHOL 188 07/03/2018   HDL 46.80 07/03/2018   LDLCALC 160 (H) 05/17/2012   LDLDIRECT 111.0 07/03/2018   TRIG 266.0 (H) 07/03/2018   CHOLHDL 4 07/03/2018

## 2018-07-23 NOTE — Assessment & Plan Note (Signed)

## 2018-07-23 NOTE — Patient Instructions (Addendum)
Try taking 1 chantix once daily,  Either at bedtime  , or after breakfast  .  If the nausea is tolerable it may help curb your appetite  Lisa Valencia may need a swallow evaluation based on what you told me about    Weight loss diet:  Premier Protein shake for breakfast and lunch.    Dannon Lt n Fit greek yogurt in between  1 ounce of nuts as your afternoon snack  Dinner:  Steamed or grilled fish,  Rice,  And a green vegetable   Try baramunda and cod (both available at [employer])  Great steamed with herbs,  Or grilled    .  Suspend simvastatin and celebrex  For now.    Prednisone for 8 days   Use the roxicodone and tramadol for pain control  Join a gyn that has an indoor pool and start walking in the water for 30 minutes daily    Repeat liver enzymes in 3-4 weeks

## 2018-07-23 NOTE — Assessment & Plan Note (Signed)
Persistent despite conservative treatment.  MRI notes L4 nerve root abutting a bulging disk , but Neurosurgery has not offered definitive treatmen   Tramadol refills given, and oxycodone prescribed today for one week  .

## 2018-07-23 NOTE — Assessment & Plan Note (Signed)
but her A1c is now in normal range with  low glycemic index diet and particpate regularly in an aerobic  exercise activity.  We should check an A1c in September   Lab Results  Component Value Date   HGBA1C 5.5 07/03/2018

## 2018-07-23 NOTE — Progress Notes (Signed)
Patient ID: Lisa Valencia, female    DOB: 1960-12-20  Age: 58 y.o. MRN: [id number]  The patient is here for annual preventive  examination and management of other chronic and acute problems.  Pap normal July 2017 Overdue for mammogram oct 2018  S/p Left THR Dec 2019 [name].  Still with left hip pain   Smoking: quit with chantix but stopped due to moderate nausea. smoking 1 cig daily     The risk factors are reflected in the social history.  The roster of all physicians providing medical care to patient - is listed in the Snapshot section of the chart.  Activities of daily living:  The patient is 100% independent in all ADLs: dressing, toileting, feeding as well as independent mobility  Home safety : The patient has smoke detectors in the home. They wear seatbelts.  There are no firearms at home. There is no violence in the home.   There is no risks for hepatitis, STDs or HIV. There is no   history of blood transfusion. They have no travel history to infectious disease endemic areas of the world.  The patient has seen their dentist in the last six month. They have seen their eye doctor in the last year. They admit to slight hearing difficulty with regard to whispered voices and some television programs.  They have deferred audiologic testing in the last year.  They do not  have excessive sun exposure. Discussed the need for sun protection: hats, long sleeves and use of sunscreen if there is significant sun exposure.   Diet: the importance of a healthy diet is discussed. They do not have a healthy diet.  The benefits of regular aerobic exercise were discussed. She has not exercised in over a year due to chronic pain complaints involving her knees hip and lumbar spine .   Depression screen: there are no signs or vegative symptoms of depression- irritability, change in appetite, anhedonia, sadness/tearfullness.  Cognitive assessment: the patient manages all their financial and personal affairs  and is actively engaged. They could relate day,date,year and events; recalled 2/3 objects at 3 minutes; performed clock-face test normally.  The following portions of the patient's history were reviewed and updated as appropriate: allergies, current medications, past family history, past medical history,  past surgical history, past social history  and problem list.  Visual acuity was not assessed per patient preference since she has regular follow up with her ophthalmologist. Hearing and body mass index were assessed and reviewed.   During the course of the visit the patient was educated and counseled about appropriate screening and preventive services including : fall prevention , diabetes screening, nutrition counseling, colorectal cancer screening, and recommended immunizations.    CC: The primary encounter diagnosis was Encounter for preventive health examination. Diagnoses of Hepatic steatosis, DDD (degenerative disc disease), lumbar, Mixed hyperlipidemia, Sciatica of right side, and Prediabetes were also pertinent to this visit.  Chronic pain :  Several weeks ago she was advised to stop use of celebrex due to elevated liver enzymes and fatty liver .  Having a lot of pain in knees,  Feet and shoulders.  Using diclofenac  gel once daily on  shoulders.  Shoulders.  Left knee became swollen to mid thigh during rehab from left hip  replacement .epidural did not work,  Had to have general anesthesia. Was bruised on the entire left leg.  Still having pain in groin.  Lower back still painful  And MRi lower spine 2018 right L4  nerve root impingement .  Saw Dr Lacinda Axon "nothing surgical" bc her pain is on th eleft.   Non diagnostic EMG   Sees [name] tomorrow   Has not worked since November 14 2017 due to back pain  issues .  Taking 100 mg tramadol every 8 hours,   And MRi lower spine 2018 right L4 nerve root impingement .  Saw Dr Lacinda Axon "nothing surgical" bc her pain is on th eleft.   Non diagnostic EMG   Pain is  under control but not gone, by late afternoon.  Can't take tylenol due to fatty liver.   Recurrent vertigo with position change. Right ear feels stopped up.  Started while in rehab , around the time of a sinus infection 6 weeks ago that was not treated  History Ave has a past medical history of Anxiety, Arthritis, Depression, Diverticulosis of intestine (2010), Esophagitis, reflux (2010 ), GERD (gastroesophageal reflux disease), and Impaired fasting glucose (01/25/2012).   She has a past surgical history that includes Appendectomy; Abdominal hysterectomy (2009); Colonoscopy with propofol (N/A, 06/05/2015); Joint replacement (Right, 2012); and Total hip arthroplasty (Left, 05/02/2018).   Her family history includes Breast cancer (age of onset: 58) in her paternal aunt; Cancer in her father, maternal grandfather, paternal aunt, paternal grandfather, and paternal grandmother; Diabetes in her maternal grandfather and sister; Hyperlipidemia in her father.She reports that she has been smoking cigarettes. She has a 35.00 pack-year smoking history. She has never used smokeless tobacco. She reports current alcohol use. She reports that she does not use drugs.  Outpatient Medications Prior to Visit  Medication Sig Dispense Refill  . citalopram (CELEXA) 40 MG tablet Take 1 tablet (40 mg total) by mouth daily. 90 tablet 1  . conjugated estrogens (PREMARIN) vaginal cream Place one applicatorful vaginally two to three times weekly 42.5 g 12  . diclofenac sodium (VOLTAREN) 1 % GEL Apply 2 g topically 4 (four) times daily. 100 g 3  . Omega-3 Fatty Acids (FISH OIL) 1000 MG CAPS Take 1,000 mg by mouth daily.     . pantoprazole (PROTONIX) 40 MG tablet TAKE 1 TABLET BY MOUTH TWICE DAILY 60 tablet 2  . spironolactone (ALDACTONE) 50 MG tablet Take 1 tablet (50 mg total) by mouth daily. 90 tablet 1  . traMADol (ULTRAM) 50 MG tablet TAKE 1 TO 2 TABLETS BY MOUTH EVERY 6 HOURS AS NEEDED FOR SEVERE BACK PAIN. MAX OF 6 PER  DAY. 180 tablet 5  . simvastatin (ZOCOR) 20 MG tablet TAKE ONE TABLET BY MOUTH AT BEDTIME (Patient not taking: Reported on 07/23/2018) 90 tablet 1  . celecoxib (CELEBREX) 200 MG capsule TAKE 1 CAPSULE BY MOUTH TWICE DAILY (Patient not taking: Reported on 07/23/2018) 180 capsule 1  . CHANTIX 1 MG tablet TAKE ONE TABLET (1MG ) BY MOUTH TWICE DAILY 60 tablet 3  . enoxaparin (LOVENOX) 30 MG/0.3ML injection Inject 0.3 mLs (30 mg total) into the skin every 12 (twelve) hours. 28 Syringe 0  . loratadine (CLARITIN) 10 MG tablet Take 10 mg by mouth daily.    . nicotine (NICODERM CQ - DOSED IN MG/24 HOURS) 21 mg/24hr patch Place 21 mg onto the skin daily.    Marland Kitchen oxyCODONE (OXY IR/ROXICODONE) 5 MG immediate release tablet Take 1 tablet (5 mg total) by mouth every 4 (four) hours as needed for moderate pain (pain score 4-6). 30 tablet 0  . polyethylene glycol (MIRALAX / GLYCOLAX) packet Take 17 g by mouth daily.    Marland Kitchen senna (SENOKOT) 8.6 MG TABS  tablet Take 1 tablet by mouth 2 (two) times daily.     No facility-administered medications prior to visit.     Review of Systems   Patient denies headache, fevers, malaise, unintentional weight loss, skin rash, eye pain, sinus congestion and sinus pain, sore throat, dysphagia,  hemoptysis , cough, dyspnea, wheezing, chest pain, palpitations, orthopnea, edema, abdominal pain, nausea, melena, diarrhea, constipation, flank pain, dysuria, hematuria, urinary  Frequency, nocturia, numbness, tingling, seizures,  Focal weakness, Loss of consciousness,  Tremor, insomnia, depression, anxiety, and suicidal ideation.      Objective:  BP 120/86 (BP Location: Left Arm, Patient Position: Sitting, Cuff Size: Large)   Pulse 88   Temp 98 F (36.7 C) (Oral)   Resp 16   Ht 5\' 10"  (1.778 m)   Wt 290 lb 6.4 oz (131.7 kg)   SpO2 95%   BMI 41.67 kg/m   Physical Exam   General appearance: alert, obese,  appears  To be in pain Head: Normocephalic, without obvious abnormality,  atraumatic Eyes: conjunctivae/corneas clear. PERRL, EOM's intact. Fundi benign. Ears: normal TM's and external ear canals both ears Nose: Nares normal. Septum midline. Mucosa normal. No drainage or sinus tenderness. Throat: lips, mucosa, and tongue normal; teeth and gums normal Neck: no adenopathy, no carotid bruit, no JVD, supple, symmetrical, trachea midline and thyroid not enlarged, symmetric, no tenderness/mass/nodules Lungs: clear to auscultation bilaterally Breasts: normal appearance, no masses or tenderness Heart: regular rate and rhythm, S1, S2 normal, no murmur, click, rub or gallop Abdomen: soft, non-tender; bowel sounds normal; no masses,  no organomegaly Extremities: extremities normal, atraumatic, no cyanosis or edema Pulses: 2+ and symmetric Skin: Skin color, texture, turgor normal. No rashes or lesions Neurologic: Alert and oriented X 3, normal strength and tone. Normal symmetric reflexes. Normal coordination and gait.      Assessment & Plan:   Problem List Items Addressed This Visit    DDD (degenerative disc disease), lumbar    She has been denied surgical options.  Will focus on pain management and weight loss.  Adding roxicodone every 6 hours .  Low carb diet given . Advised to join a gym with a swimming pool and start walking in the water for resistance       Relevant Medications   oxyCODONE (ROXICODONE) 5 MG immediate release tablet   predniSONE (DELTASONE) 10 MG tablet   Encounter for preventive health examination - Primary    age appropriate education and counseling updated, referrals for preventative services and immunizations addressed, dietary and smoking counseling addressed, most recent labs reviewed.  I have personally reviewed and have noted:  1) the patient's medical and social history 2) The pt's use of alcohol, tobacco, and illicit drugs 3) The patient's current medications and supplements 4) Functional ability including ADL's, fall risk, home safety  risk, hearing and visual impairment 5) Diet and physical activities 6) Evidence for depression or mood disorder 7) The patient's height, weight, and BMI have been recorded in the chart  I have made referrals, and provided counseling and education based on review of the above      Hepatic steatosis    suggested by July 2016 ultrasound and negative serologies for autoimmune etiologies  HER ALT has been persistently mildly  elevated (<2 x ULN)  since 2013, but she has decided to stop simvastatin  .  Lab Results  Component Value Date   ALT 102 (H) 07/23/2018   AST 50 (H) 07/23/2018   ALKPHOS 55 07/23/2018   BILITOT  0.3 07/23/2018         Relevant Orders   Comprehensive metabolic panel (Completed)   Ambulatory referral to Gastroenterology   Hyperlipidemia, unspecified    Statin therapy was advised and started in 2017.  She has stopped the medication as of one week ago due to elevated liver enzymes , which have been mildly elevated ( < 2 x ULN) since 2013.  Will advise her to resume medication  Lab Results  Component Value Date   CHOL 188 07/03/2018   HDL 46.80 07/03/2018   LDLCALC 160 (H) 05/17/2012   LDLDIRECT 111.0 07/03/2018   TRIG 266.0 (H) 07/03/2018   CHOLHDL 4 07/03/2018         Prediabetes    but her A1c is now in normal range with  low glycemic index diet and particpate regularly in an aerobic  exercise activity.  We should check an A1c in September   Lab Results  Component Value Date   HGBA1C 5.5 07/03/2018         Sciatica    Persistent despite conservative treatment.  MRI notes L4 nerve root abutting a bulging disk , but Neurosurgery has not offered definitive treatmen   Tramadol refills given, and oxycodone prescribed today for one week  .         I have discontinued Juliann Pulse A. Gasparro's celecoxib, loratadine, polyethylene glycol, oxyCODONE, enoxaparin, nicotine, senna, CHANTIX, and celecoxib. I am also having her start on oxyCODONE and predniSONE.  Additionally, I am having her maintain her Fish Oil, diclofenac sodium, citalopram, spironolactone, simvastatin, conjugated estrogens, traMADol, and pantoprazole.  Meds ordered this encounter  Medications  . oxyCODONE (ROXICODONE) 5 MG immediate release tablet    Sig: Take 1 tablet (5 mg total) by mouth every 6 (six) hours as needed for severe pain.    Dispense:  30 tablet    Refill:  0  . DISCONTD: celecoxib (CELEBREX) 200 MG capsule    Sig: Take 1 capsule (200 mg total) by mouth daily.    Dispense:  90 capsule    Refill:  1    REFILLS FOR NEXT TIMETHANKS                    PROFILE  . predniSONE (DELTASONE) 10 MG tablet    Sig: 6 tablets daily for 3 days  , then reduce by 1 tablet daily until gone    Dispense:  33 tablet    Refill:  0    Medications Discontinued During This Encounter  Medication Reason  . polyethylene glycol (MIRALAX / GLYCOLAX) packet Error  . oxyCODONE (OXY IR/ROXICODONE) 5 MG immediate release tablet Error  . enoxaparin (LOVENOX) 30 MG/0.3ML injection Completed Course  . nicotine (NICODERM CQ - DOSED IN MG/24 HOURS) 21 mg/24hr patch Error  . senna (SENOKOT) 8.6 MG TABS tablet Error  . CHANTIX 1 MG tablet Completed Course  . loratadine (CLARITIN) 10 MG tablet Patient Preference  . celecoxib (CELEBREX) 200 MG capsule Reorder  . celecoxib (CELEBREX) 200 MG capsule     Follow-up: No follow-ups on file.   Crecencio Mc, MD

## 2018-07-23 NOTE — Assessment & Plan Note (Addendum)
suggested by July 2016 ultrasound and negative serologies for autoimmune etiologies  HER ALT has been persistently mildly  elevated (<2 x ULN)  since 2013, but she has decided to stop simvastatin  .  Lab Results  Component Value Date   ALT 102 (H) 07/23/2018   AST 50 (H) 07/23/2018   ALKPHOS 55 07/23/2018   BILITOT 0.3 07/23/2018

## 2018-07-23 NOTE — Assessment & Plan Note (Addendum)
She has been denied surgical options.  Will focus on pain management and weight loss.  Adding roxicodone every 6 hours .  Low carb diet given . Advised to join a gym with a swimming pool and start walking in the water for resistance

## 2018-08-07 ENCOUNTER — Other Ambulatory Visit: Payer: Self-pay | Admitting: Internal Medicine

## 2018-08-07 ENCOUNTER — Other Ambulatory Visit: Payer: Self-pay | Admitting: Adult Health

## 2018-08-08 ENCOUNTER — Other Ambulatory Visit (INDEPENDENT_AMBULATORY_CARE_PROVIDER_SITE_OTHER): Payer: Managed Care, Other (non HMO)

## 2018-08-08 DIAGNOSIS — K760 Fatty (change of) liver, not elsewhere classified: Secondary | ICD-10-CM | POA: Diagnosis not present

## 2018-08-08 LAB — HEPATIC FUNCTION PANEL
ALT: 46 U/L — ABNORMAL HIGH (ref 0–35)
AST: 21 U/L (ref 0–37)
Albumin: 4.2 g/dL (ref 3.5–5.2)
Alkaline Phosphatase: 51 U/L (ref 39–117)
Bilirubin, Direct: 0 mg/dL (ref 0.0–0.3)
Total Bilirubin: 0.4 mg/dL (ref 0.2–1.2)
Total Protein: 8 g/dL (ref 6.0–8.3)

## 2018-08-10 ENCOUNTER — Other Ambulatory Visit: Payer: Self-pay | Admitting: Adult Health

## 2018-08-16 ENCOUNTER — Other Ambulatory Visit: Payer: Self-pay

## 2018-08-16 MED ORDER — OXYCODONE HCL 5 MG PO TABS: 5.0000 mg | ORAL_TABLET | Freq: Two times a day (BID) | ORAL | 0 refills | Status: DC | PRN

## 2018-08-16 NOTE — Telephone Encounter (Signed)
Refilled: 07/23/2018 Last OV: 07/23/2018 Next OV: not scheduled 

## 2018-08-28 ENCOUNTER — Other Ambulatory Visit: Payer: Self-pay | Admitting: Internal Medicine

## 2018-08-28 DIAGNOSIS — Z1231 Encounter for screening mammogram for malignant neoplasm of breast: Secondary | ICD-10-CM

## 2018-09-03 ENCOUNTER — Ambulatory Visit (INDEPENDENT_AMBULATORY_CARE_PROVIDER_SITE_OTHER): Payer: Managed Care, Other (non HMO) | Admitting: Gastroenterology

## 2018-09-03 ENCOUNTER — Encounter

## 2018-09-03 ENCOUNTER — Encounter: Payer: Self-pay | Admitting: Gastroenterology

## 2018-09-03 ENCOUNTER — Other Ambulatory Visit
Admission: RE | Admit: 2018-09-03 | Discharge: 2018-09-03 | Disposition: A | Discharge status: Home / Self Care | Payer: Managed Care, Other (non HMO) | Attending: Gastroenterology | Admitting: Gastroenterology

## 2018-09-03 VITALS — BP 128/76 | HR 99 | Ht 70.0 in | Wt 289.2 lb

## 2018-09-03 DIAGNOSIS — R748 Abnormal levels of other serum enzymes: Secondary | ICD-10-CM

## 2018-09-03 LAB — HEPATIC FUNCTION PANEL
ALT: 34 U/L (ref 0–44)
AST: 26 U/L (ref 15–41)
Albumin: 4.1 g/dL (ref 3.5–5.0)
Alkaline Phosphatase: 51 U/L (ref 38–126)
Bilirubin, Direct: <0.1 mg/dL (ref 0.0–0.2)
Total Bilirubin: 0.6 mg/dL (ref 0.3–1.2)
Total Protein: 8.2 g/dL — ABNORMAL HIGH (ref 6.5–8.1)

## 2018-09-03 NOTE — Patient Instructions (Addendum)
You are scheduled for a RUQ abdominal US at [facility] outpatient imaging, [place] location on Wednesday, February 19th at 9:30am. Please arrive at the registration desk at 9:15am. You cannot have anything to eat or drink after midnight on Tuesday night.   If you need to reschedule this appointment for any reason, please contact central scheduling at [phone].

## 2018-09-03 NOTE — Progress Notes (Signed)
Gastroenterology Consultation  Referring Provider:     Crecencio Mc, MD Primary Care Physician:  Crecencio Mc, MD Primary Gastroenterologist:  Dr. Allen Norris     Reason for Consultation:     Fatty liver        HPI:   Lisa Valencia is a 58 y.o. y/o female referred for consultation & management of Fatty liver by Dr. Derrel Nip, Aris Everts, MD.  This patient comes in today with a history of fatty liver. The patient had seen me in 2016 for a screening colonoscopy.  The patient also had an ultrasound back in 2016 showing increased echogenicity consistent with fatty liver disease.The patient's liver enzymes have been elevated with a isolated ALT of 51 back in 2015.  In 2016 the ALT increased as high as 66 but in 2018 only peaked at 46.  The patient's liver enzymes came back to normal in March and July 2019. In January of this year the patient's liver enzymes when up to 102 ALT with a elevated AST of 50.  The latter repeated one week later and came down to a normal AST of 21 with an ALT of 46. See Below:  Component     Latest Ref Rng & Units 04/19/2018 07/03/2018 07/23/2018 08/08/2018  Total Bilirubin     0.3 - 1.2 mg/dL 0.6 0.5 0.3 0.4  Bilirubin, Direct     0.0 - 0.2 mg/dL    0.0  Alkaline Phosphatase     38 - 126 U/L 42 49 55 51  AST     15 - 41 U/L 31 37 50 (H) 21  ALT     0 - 44 U/L 47 (H) 70 (H) 102 (H) 46 (H)  Total Protein     6.5 - 8.1 g/dL 7.7 7.5 7.9 8.0   Component     Latest Ref Rng & Units 09/03/2018  Total Bilirubin     0.3 - 1.2 mg/dL 0.6  Bilirubin, Direct     0.0 - 0.2 mg/dL <0.1  Alkaline Phosphatase     38 - 126 U/L 51  AST     15 - 41 U/L 26  ALT     0 - 44 U/L 34  Total Protein     6.5 - 8.1 g/dL 8.2 (H)    The patient reports that she is taking medication for her cholesterol.  The patient was also taking medication for musculoskeletal pain. She reports that this medication was stopped but her anticholesterol medication was continued.  She was on Celebrex which she  reports was helping her symptoms of pain.  Past Medical History:  Diagnosis Date  . Anxiety   . Arthritis   . Depression   . Diverticulosis of intestine 2010   [facility]  . Esophagitis, reflux 2010    EGD by [facility], managed wiht dexilant  . GERD (gastroesophageal reflux disease)   . Impaired fasting glucose 01/25/2012   Needs eye exam.      Past Surgical History:  Procedure Laterality Date  . ABDOMINAL HYSTERECTOMY  2009   supracervical   . APPENDECTOMY    . COLONOSCOPY WITH PROPOFOL N/A 06/05/2015   Procedure: COLONOSCOPY WITH PROPOFOL;  Surgeon: Lucilla Lame, MD;  Location: [place];  Service: Endoscopy;  Laterality: N/A;  Pt requests early. Pt for antibiotic pre-procedure  CECAL PROTRUSION BX. / WITH CLIP LOT # [id number] RESOLUTION 360 EXP 03/03/2016 SIGMOID POLYP  . JOINT REPLACEMENT Right 2012   hip, [name]  .  TOTAL HIP ARTHROPLASTY Left 05/02/2018   Procedure: TOTAL HIP ARTHROPLASTY;  Surgeon: Dereck Leep, MD;  Location: [facility];  Service: Orthopedics;  Laterality: Left;    Prior to Admission medications   Medication Sig Start Date End Date Taking? Authorizing Provider  citalopram (CELEXA) 40 MG tablet Take 1 tablet (40 mg total) by mouth daily. 11/20/17  Yes Crecencio Mc, MD  conjugated estrogens (PREMARIN) vaginal cream Place one applicatorful vaginally two to three times weekly 06/06/18  Yes Crecencio Mc, MD  oxyCODONE (ROXICODONE) 5 MG immediate release tablet Take 1 tablet (5 mg total) by mouth 2 (two) times daily as needed for severe pain. 08/16/18  Yes Crecencio Mc, MD  pantoprazole (PROTONIX) 40 MG tablet TAKE ONE TABLET BY MOUTH TWICE DAILY 08/08/18  Yes Crecencio Mc, MD  simvastatin (ZOCOR) 20 MG tablet TAKE ONE TABLET BY MOUTH AT BEDTIME 12/26/17  Yes Crecencio Mc, MD  spironolactone (ALDACTONE) 50 MG tablet Take 1 tablet (50 mg total) by mouth daily. 11/20/17  Yes Crecencio Mc, MD  traMADol (ULTRAM) 50 MG tablet TAKE 1 TO 2  TABLETS BY MOUTH EVERY 6 HOURS AS NEEDED FOR SEVERE BACK PAIN. MAX OF 6 PER DAY. 06/06/18  Yes Crecencio Mc, MD  diclofenac sodium (VOLTAREN) 1 % GEL Apply 2 g topically 4 (four) times daily. Patient not taking: Reported on 09/03/2018 02/15/17   Crecencio Mc, MD  Omega-3 Fatty Acids (FISH OIL) 1000 MG CAPS Take 1,000 mg by mouth daily.     [provider]  predniSONE (DELTASONE) 10 MG tablet 6 tablets daily for 3 days  , then reduce by 1 tablet daily until gone 07/23/18   Crecencio Mc, MD    Family History  Problem Relation Age of Onset  . Cancer Father        lung ca  . Hyperlipidemia Father        PAD  . Diabetes Sister   . Cancer Paternal Aunt        breast  . Breast cancer Paternal Aunt 10  . Diabetes Maternal Grandfather   . Cancer Maternal Grandfather        colon ca  . Cancer Paternal Grandmother        colon  . Cancer Paternal Grandfather        colon ca  . Heart disease Neg Hx      Social History   Tobacco Use  . Smoking status: Current Every Day Smoker    Packs/day: 1.00    Years: 35.00    Pack years: 35.00    Types: Cigarettes  . Smokeless tobacco: Never Used  Substance Use Topics  . Alcohol use: Yes    Comment: 2 drinks per year  . Drug use: No    Allergies as of 09/03/2018 - Review Complete 09/03/2018  Allergen Reaction Noted  . Sulfa antibiotics Swelling and Other (See Comments) 01/25/2012  . Codeine sulfate Nausea Only 05/19/2015  . Fenofibrate Other (See Comments) 03/05/2014  . Pseudoephedrine hcl Palpitations 12/06/2013  . Wellbutrin [bupropion] Hives 01/25/2012    Review of Systems:    All systems reviewed and negative except where noted in HPI.   Physical Exam:  BP 128/76   Pulse 99   Ht 5\' 10"  (1.778 m)   Wt 289 lb 3.2 oz (131.2 kg)   BMI 41.50 kg/m  No LMP recorded. Patient has had a hysterectomy. General:   Alert,  Well-developed, well-nourished, pleasant and cooperative  in NAD Head:  Normocephalic and  atraumatic. Eyes:  Sclera clear, no icterus.   Conjunctiva pink. Ears:  Normal auditory acuity. Nose:  No deformity, discharge, or lesions. Mouth:  No deformity or lesions,oropharynx pink & moist. Neck:  Supple; no masses or thyromegaly. Lungs:  Respirations even and unlabored.  Clear throughout to auscultation.   No wheezes, crackles, or rhonchi. No acute distress. Heart:  Regular rate and rhythm; no murmurs, clicks, rubs, or gallops. Abdomen:  Normal bowel sounds.  No bruits.  Soft, non-tender and non-distended without masses, hepatosplenomegaly or hernias noted.  No guarding or rebound tenderness.  Negative Carnett sign.   Rectal:  Deferred.  Msk:  Symmetrical without gross deformities.  Good, equal movement & strength bilaterally. Pulses:  Normal pulses noted. Extremities:  No clubbing or edema.  No cyanosis. Neurologic:  Alert and oriented x3;  grossly normal neurologically. Skin:  Intact without significant lesions or rashes.  No jaundice. Lymph Nodes:  No significant cervical adenopathy. Psych:  Alert and cooperative. Normal mood and affect.  Imaging Studies: No results found.  Assessment and Plan:   Lisa Valencia is a 59 y.o. y/o female who comes in with a history of fatty liver and elevated liver enzymes. The patient's liver enzymes have been elevated for some time and she was recently told to stop her antibiotic to medication.  This was done because it was thought to be contributing to her abnormal liver enzymes.  The patient will have her liver enzymes checked again and she will be set up for a right upper quadrant ultrasound. The patient is not immune to hepatitis B and will need vaccinations for hepatitis B.  She will also have her blood sends off for hepatitis A immunity.  Other labs will be sent off to see if her increased liver enzymes are caused by another factor. If her liver enzymes are back to normal today and it is likely due to her anti-inflammatory medication and she  low back wanted to see if her liver enzymes go back up solidifying diagnosis of NSAID-induced hepatitis.  The patient has been explained the plan and agrees with it.  Lucilla Lame, MD. Marval Regal    Note: This dictation was prepared with Dragon dictation along with smaller phrase technology. Any transcriptional errors that result from this process are unintentional.

## 2018-09-04 LAB — ANTI-SMOOTH MUSCLE ANTIBODY, IGG: F-Actin IgG: 12 Units (ref 0–19)

## 2018-09-04 LAB — CERULOPLASMIN: Ceruloplasmin: 25.7 mg/dL (ref 19.0–39.0)

## 2018-09-04 LAB — ALPHA-1-ANTITRYPSIN: A-1 Antitrypsin, Ser: 132 mg/dL (ref 101–187)

## 2018-09-04 LAB — HEPATITIS A ANTIBODY, TOTAL: Hep A Total Ab: POSITIVE — AB

## 2018-09-04 LAB — MITOCHONDRIAL ANTIBODIES: Mitochondrial M2 Ab, IgG: <20 Units (ref 0.0–20.0)

## 2018-09-05 ENCOUNTER — Other Ambulatory Visit: Payer: Self-pay

## 2018-09-05 ENCOUNTER — Ambulatory Visit
Admission: RE | Admit: 2018-09-05 | Discharge: 2018-09-05 | Disposition: A | Discharge status: Home / Self Care | Payer: Managed Care, Other (non HMO) | Source: Ambulatory Visit | Attending: Gastroenterology | Admitting: Gastroenterology

## 2018-09-05 DIAGNOSIS — R748 Abnormal levels of other serum enzymes: Secondary | ICD-10-CM | POA: Diagnosis present

## 2018-09-06 ENCOUNTER — Other Ambulatory Visit: Payer: Self-pay | Admitting: Adult Health

## 2018-09-06 ENCOUNTER — Other Ambulatory Visit: Payer: Self-pay | Admitting: Internal Medicine

## 2018-09-06 ENCOUNTER — Telehealth: Payer: Self-pay

## 2018-09-06 NOTE — Telephone Encounter (Signed)
-----   Message from Lucilla Lame, MD sent at 09/06/2018  6:40 AM EST ----- The patient know that her blood Blood tests came back all normal.  She is immune to hepatitis A but these a vaccination for hepatitis B.  Her liver enzymes are normal.

## 2018-09-06 NOTE — Telephone Encounter (Signed)
-----   Message from Lucilla Lame, MD sent at 09/06/2018  6:29 AM EST ----- Let the patient know that the ultrasound didn't show her to have fatty liver. Otherwise the exam was Normal.

## 2018-09-06 NOTE — Telephone Encounter (Signed)
LVM for pt to return my call.

## 2018-09-06 NOTE — Telephone Encounter (Signed)
LMTCB to see if patient had enough medication to last until Dr Lupita Dawn return to office.

## 2018-09-07 ENCOUNTER — Other Ambulatory Visit: Payer: Self-pay | Admitting: Internal Medicine

## 2018-09-10 ENCOUNTER — Other Ambulatory Visit: Payer: Self-pay

## 2018-09-10 NOTE — Telephone Encounter (Signed)
Last OV 07/23/2018 Next OV not scheduled Last refill 08/16/2018

## 2018-09-12 ENCOUNTER — Ambulatory Visit
Admission: RE | Admit: 2018-09-12 | Discharge: 2018-09-12 | Disposition: A | Discharge status: Home / Self Care | Payer: Managed Care, Other (non HMO) | Source: Ambulatory Visit | Attending: Internal Medicine | Admitting: Internal Medicine

## 2018-09-12 DIAGNOSIS — Z1231 Encounter for screening mammogram for malignant neoplasm of breast: Secondary | ICD-10-CM | POA: Diagnosis present

## 2018-09-13 ENCOUNTER — Telehealth: Payer: Self-pay | Admitting: Gastroenterology

## 2018-09-13 NOTE — Telephone Encounter (Signed)
She is calling to obtain u/s & lab results. She has seen them on MY Chart but is unsure how to interpret.

## 2018-09-13 NOTE — Telephone Encounter (Signed)
Contacted pt and informed her of the [place] and lab results.

## 2018-09-13 NOTE — Telephone Encounter (Signed)
Pt notified of lab and [place] results.

## 2018-10-09 ENCOUNTER — Other Ambulatory Visit: Payer: Self-pay

## 2018-10-09 ENCOUNTER — Other Ambulatory Visit: Payer: Self-pay | Admitting: Internal Medicine

## 2018-10-09 MED ORDER — OXYCODONE HCL 5 MG PO TABS: ORAL_TABLET | ORAL | 0 refills | Status: DC

## 2018-10-09 MED ORDER — CELECOXIB 200 MG PO CAPS: 200.0000 mg | ORAL_CAPSULE | Freq: Every day | ORAL | 1 refills | Status: DC

## 2018-10-09 MED ORDER — SPIRONOLACTONE 50 MG PO TABS: 50.0000 mg | ORAL_TABLET | Freq: Every day | ORAL | 1 refills | Status: DC

## 2018-10-09 NOTE — Telephone Encounter (Signed)
Oxycodone refilled.  Needs to set up office visit or web visit prior to next refill   Celebrex dose should be once daily going forward.  [facility]

## 2018-10-09 NOTE — Telephone Encounter (Signed)
Pt. Is requesting oxycodone.  Please advise.  Nina,cma

## 2018-10-10 NOTE — Telephone Encounter (Signed)
I spoke with patient & she is aware of below. F/u scheduled 11/07/18.

## 2018-10-23 DIAGNOSIS — M1612 Unilateral primary osteoarthritis, left hip: Secondary | ICD-10-CM | POA: Diagnosis not present

## 2018-10-23 DIAGNOSIS — Z96642 Presence of left artificial hip joint: Secondary | ICD-10-CM | POA: Diagnosis not present

## 2018-10-23 DIAGNOSIS — M5136 Other intervertebral disc degeneration, lumbar region: Secondary | ICD-10-CM | POA: Diagnosis not present

## 2018-11-07 ENCOUNTER — Other Ambulatory Visit: Payer: Self-pay

## 2018-11-07 ENCOUNTER — Ambulatory Visit (INDEPENDENT_AMBULATORY_CARE_PROVIDER_SITE_OTHER): Payer: 59 | Admitting: Internal Medicine

## 2018-11-07 DIAGNOSIS — G8929 Other chronic pain: Secondary | ICD-10-CM

## 2018-11-07 DIAGNOSIS — R7303 Prediabetes: Secondary | ICD-10-CM | POA: Diagnosis not present

## 2018-11-07 DIAGNOSIS — Z87891 Personal history of nicotine dependence: Secondary | ICD-10-CM | POA: Diagnosis not present

## 2018-11-07 DIAGNOSIS — M5431 Sciatica, right side: Secondary | ICD-10-CM

## 2018-11-07 DIAGNOSIS — M5442 Lumbago with sciatica, left side: Secondary | ICD-10-CM | POA: Diagnosis not present

## 2018-11-07 DIAGNOSIS — M1612 Unilateral primary osteoarthritis, left hip: Secondary | ICD-10-CM

## 2018-11-07 MED ORDER — SPIRONOLACTONE 50 MG PO TABS: 50.0000 mg | ORAL_TABLET | Freq: Every day | ORAL | 1 refills | Status: DC

## 2018-11-07 MED ORDER — SIMVASTATIN 20 MG PO TABS: 20.0000 mg | ORAL_TABLET | Freq: Every day | ORAL | 1 refills | Status: DC

## 2018-11-07 MED ORDER — CITALOPRAM HYDROBROMIDE 20 MG PO TABS: 20.0000 mg | ORAL_TABLET | Freq: Every day | ORAL | 3 refills | Status: DC

## 2018-11-07 MED ORDER — OXYCODONE HCL 5 MG PO TABS: ORAL_TABLET | ORAL | 0 refills | Status: DC

## 2018-11-07 MED ORDER — PANTOPRAZOLE SODIUM 40 MG PO TBEC: 40.0000 mg | DELAYED_RELEASE_TABLET | Freq: Every day | ORAL | 5 refills | Status: DC

## 2018-11-07 MED ORDER — TRAMADOL HCL 50 MG PO TABS: ORAL_TABLET | ORAL | 5 refills | Status: DC

## 2018-11-07 MED ORDER — DULOXETINE HCL 20 MG PO CPEP: 20.0000 mg | ORAL_CAPSULE | Freq: Every day | ORAL | 3 refills | Status: DC

## 2018-11-07 MED ORDER — CITALOPRAM HYDROBROMIDE 40 MG PO TABS: 40.0000 mg | ORAL_TABLET | Freq: Every day | ORAL | 1 refills | Status: DC

## 2018-11-07 MED ORDER — FAMOTIDINE 20 MG PO TABS: 20.0000 mg | ORAL_TABLET | Freq: Every day | ORAL | 5 refills | Status: DC

## 2018-11-07 NOTE — Patient Instructions (Addendum)
I am starting you on Cymbalta (duloxetine) to help manage your pain   Reduce your citalopram dose to 20 mg  And take the cymbalta in the evening after supper   Starting dose is 20 mg,  We can increase dose to 30 mg after 30 days  I am changing your 2nd dose of protonix to famotidine (pepcid) to save you money  Send me a mychart message in 2-3 weeks regarding the effect of the cymbalta  Make a follow up appt in 3 months   There is a potential interaction between cymbalta and tramadol so make sure you are taking no mor than 3 tramadol daily .  The oxycodone can be increased to 3 times daily if necessary,  Just let me know and I will change the prescription

## 2018-11-07 NOTE — Progress Notes (Signed)
Virtual Visit via DOXY.ME  This visit type was conducted due to national recommendations for restrictions regarding the COVID-19 pandemic (e.g. social distancing).  This format is felt to be most appropriate for this patient at this time.  All issues noted in this document were discussed and addressed.  No physical exam was performed (except for noted visual exam findings with Video Visits).   I connected with@ on 11/09/18 at  8:30 AM EDT by a video enabled telemedicine application and verified that I am speaking with the correct person using two identifiers. Location patient: home Location provider: work  Persons participating in the virtual visit: patient, provider  I discussed the limitations, risks, security and privacy concerns of performing an evaluation and management service by telephone and the availability of in person appointments. I also discussed with the patient that there may be a patient responsible charge related to this service. The patient expressed understanding and agreed to proceed.   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   She has been tobacco free using Chantix since November.   She has been having increased pain involving the left  hip and saw [name],  Hip x rayed and implants found to be in good position.  Recent gardening and vacuuming activities as well as radiculopathy from lumbar spine issues were cited as causative. Advised to continue core exercises and walking daily' weight loss advised .   Discussed current pan management,  continuining celebrex ,  Needs GI prophylaxis,  And trial of cymbalta discussed   ROS: See pertinent positives and negatives per HPI.  Past Medical History:  Diagnosis Date  .  Anxiety   . Arthritis   . Depression   . Diverticulosis of intestine 2010   iftikhar  . Esophagitis, reflux 2010    EGD by [facility]tkihar, managed wiht dexilant  . GERD (gastroesophageal reflux disease)   . Impaired fasting glucose 01/25/2012   Needs eye exam.      Past Surgical History:  Procedure Laterality Date  . ABDOMINAL HYSTERECTOMY  2009   supracervical   . APPENDECTOMY    . BREAST CYST ASPIRATION Left    lt fna-neg  . COLONOSCOPY WITH PROPOFOL N/A 06/05/2015   Procedure: COLONOSCOPY WITH PROPOFOL;  Surgeon: Lucilla Lame, MD;  Location: [facility];  Service: Endoscopy;  Laterality: N/A;  Pt requests early. Pt for antibiotic pre-procedure  CECAL PROTRUSION BX. / WITH CLIP LOT # [id number] RESOLUTION 360 EXP 03/03/2016 SIGMOID POLYP  . JOINT REPLACEMENT Right 2012   hip, [name]  . TOTAL HIP ARTHROPLASTY Left 05/02/2018   Procedure: TOTAL HIP ARTHROPLASTY;  Surgeon: Dereck Leep, MD;  Location: [facility];  Service: Orthopedics;  Laterality: Left;    Family History  Problem Relation Age of Onset  . Cancer Father        lung ca  . Hyperlipidemia Father        PAD  . Diabetes Sister   . Cancer Paternal Aunt        breast  . Breast cancer Paternal Aunt 44  . Diabetes Maternal Grandfather   . Cancer Maternal Grandfather        colon ca  . Cancer Paternal Grandmother  colon  . Cancer Paternal Grandfather        colon ca  . Heart disease Neg Hx     SOCIAL HX: married,  X tobacco ,  Laid off from Hospice.   Current Outpatient Medications:  .  aspirin EC 81 MG tablet, Take 81 mg by mouth daily., Disp: , Rfl:  .  celecoxib (CELEBREX) 200 MG capsule, Take 1 capsule (200 mg total) by mouth daily. TAKE 1 CAPSULE BY MOUTH 2 TIMES DAILY., Disp: 90 capsule, Rfl: 1 .  cetirizine (ZYRTEC) 10 MG tablet, Take 10 mg by mouth daily. , Disp: , Rfl:  .  CHANTIX 1 MG tablet, TAKE ONE TABLET TWICE DAILY, Disp: 60 tablet, Rfl: 3 .  conjugated estrogens (PREMARIN) vaginal  cream, Place one applicatorful vaginally two to three times weekly, Disp: 42.5 g, Rfl: 12 .  Omega-3 Fatty Acids (FISH OIL) 1000 MG CAPS, Take 1,000 mg by mouth daily. , Disp: , Rfl:  .  oxyCODONE (OXY IR/ROXICODONE) 5 MG immediate release tablet, TAKE 1 TABLET BY MOUTH TWICE DAILY AS NEEDED FOR SEVERE PAIN, Disp: 60 tablet, Rfl: 0 .  pantoprazole (PROTONIX) 40 MG tablet, Take 1 tablet (40 mg total) by mouth daily., Disp: 30 tablet, Rfl: 5 .  simvastatin (ZOCOR) 20 MG tablet, Take 1 tablet (20 mg total) by mouth at bedtime., Disp: 90 tablet, Rfl: 1 .  spironolactone (ALDACTONE) 50 MG tablet, Take 1 tablet (50 mg total) by mouth daily., Disp: 90 tablet, Rfl: 1 .  traMADol (ULTRAM) 50 MG tablet, TAKE 1 TO 2 TABLETS BY MOUTH EVERY 6 HOURS AS NEEDED FOR SEVERE BACK PAIN. MAX OF 6 PER DAY., Disp: 180 tablet, Rfl: 5 .  citalopram (CELEXA) 20 MG tablet, Take 1 tablet (20 mg total) by mouth daily., Disp: 30 tablet, Rfl: 3 .  DULoxetine (CYMBALTA) 20 MG capsule, Take 1 capsule (20 mg total) by mouth daily after supper., Disp: 30 capsule, Rfl: 3 .  famotidine (PEPCID) 20 MG tablet, Take 1 tablet (20 mg total) by mouth at bedtime., Disp: 30 tablet, Rfl: 5  EXAM:  VITALS per patient if applicable:  GENERAL: alert, oriented, appears well and in no acute distress  HEENT: atraumatic, conjunttiva clear, no obvious abnormalities on inspection of external nose and ears  NECK: normal movements of the head and neck  LUNGS: on inspection no signs of respiratory distress, breathing rate appears normal, no obvious gross SOB, gasping or wheezing  CV: no obvious cyanosis  MS: moves all visible extremities without noticeable abnormality  PSYCH/NEURO: pleasant and cooperative, no obvious depression or anxiety, speech and thought processing grossly intact  ASSESSMENT AND PLAN:  Discussed the following assessment and plan:  Primary osteoarthritis of left hip  Prediabetes  History of tobacco abuse  Sciatica  of right side  Chronic bilateral low back pain with left-sided sciatica  Primary osteoarthritis of left hip She is s/p [facility] Oct 019 but continues to have pain aggravated by daily activities,  Continue once daily celebrex and tramadol,  prn oxycodone  Adding cymbalta   Prediabetes her A1c  Was normalized in Dec 2019 with  low glycemic index diet.  encouraged to resume regular participation in walking  Until swimming can be resumed.    Lab Results  Component Value Date   HGBA1C 5.5 07/03/2018     History of tobacco abuse I have congratulated her in quitting and encouraged  Abstinence using chantix   Sciatica Persistent despite conservative treatment.  MRI notes L4 nerve root  abutting a bulging disk , but Neurosurgery has not offered definitive treatment.  Weight loss strongly advised,  Using celebrex and tramadol regularly and 10 mg of oxycodone daily for nocturnal pain. Adding cymbalta for management of depressive symptoms brought on by chronic pain .  Reducing citalopram dose to 20 mg daily.  Narcotics Refill history confirmed via [place] Controlled Substance databas, accessed by me today..  Chronic bilateral low back pain Patient's [place] PDMP profile was reviewed . Narcan use was offered  For use in the event of an accidental overdose but declined.  Oxycodone refilled for 30 days  Qty #60.  Using 6 tramadol daily along with celebrex.  Adding cymbalta .      I discussed the assessment and treatment plan with the patient. The patient was provided an opportunity to ask questions and all were answered. The patient agreed with the plan and demonstrated an understanding of the instructions.   The patient was advised to call back or seek an in-person evaluation if the symptoms worsen or if the condition fails to improve as anticipated.  I provided 20 minutes of non-face-to-face time during this encounter.   Crecencio Mc, MD

## 2018-11-09 DIAGNOSIS — G8929 Other chronic pain: Secondary | ICD-10-CM | POA: Insufficient documentation

## 2018-11-09 DIAGNOSIS — M545 Low back pain: Secondary | ICD-10-CM

## 2018-11-09 NOTE — Assessment & Plan Note (Signed)
She is s/p [facility] Oct 019 but continues to have pain aggravated by daily activities,  Continue once daily celebrex and tramadol,  prn oxycodone  Adding cymbalta

## 2018-11-09 NOTE — Assessment & Plan Note (Signed)
I have congratulated her in quitting and encouraged  Abstinence using chantix

## 2018-11-09 NOTE — Assessment & Plan Note (Signed)
Patient's [place] PDMP profile was reviewed . Narcan use was offered  For use in the event of an accidental overdose but declined.  Oxycodone refilled for 30 days  Qty #60.  Using 6 tramadol daily along with celebrex.  Adding cymbalta .

## 2018-11-09 NOTE — Assessment & Plan Note (Addendum)
Persistent despite conservative treatment.  MRI notes L4 nerve root abutting a bulging disk , but Neurosurgery has not offered definitive treatment.  Weight loss strongly advised,  Using celebrex and tramadol regularly and 10 mg of oxycodone daily for nocturnal pain. Adding cymbalta for management of depressive symptoms brought on by chronic pain .  Reducing citalopram dose to 20 mg daily.  Narcotics Refill history confirmed via [place] Controlled Substance databas, accessed by me today.Lisa Valencia

## 2018-11-09 NOTE — Assessment & Plan Note (Signed)
her A1c  Was normalized in Dec 2019 with  low glycemic index diet.  encouraged to resume regular participation in walking  Until swimming can be resumed.    Lab Results  Component Value Date   HGBA1C 5.5 07/03/2018

## 2018-12-01 ENCOUNTER — Other Ambulatory Visit: Payer: Self-pay | Admitting: Internal Medicine

## 2018-12-03 NOTE — Telephone Encounter (Signed)
Refilled: 11/07/2018 Last OV: 11/07/2018 Next OV: 02/11/2019

## 2018-12-19 ENCOUNTER — Other Ambulatory Visit: Payer: Self-pay | Admitting: Internal Medicine

## 2018-12-19 MED ORDER — DULOXETINE HCL 20 MG PO CPEP: 20.0000 mg | ORAL_CAPSULE | Freq: Every day | ORAL | 1 refills | Status: DC

## 2018-12-26 ENCOUNTER — Ambulatory Visit: Payer: Managed Care, Other (non HMO) | Admitting: Internal Medicine

## 2018-12-31 DIAGNOSIS — K760 Fatty (change of) liver, not elsewhere classified: Secondary | ICD-10-CM

## 2018-12-31 DIAGNOSIS — E782 Mixed hyperlipidemia: Secondary | ICD-10-CM

## 2018-12-31 DIAGNOSIS — R7303 Prediabetes: Secondary | ICD-10-CM

## 2019-01-02 ENCOUNTER — Other Ambulatory Visit: Payer: Self-pay | Admitting: Internal Medicine

## 2019-01-02 MED ORDER — OXYCODONE HCL 5 MG PO TABS: 5.0000 mg | ORAL_TABLET | Freq: Three times a day (TID) | ORAL | 0 refills | Status: DC | PRN

## 2019-01-13 ENCOUNTER — Telehealth: Payer: 59 | Admitting: Family

## 2019-01-13 DIAGNOSIS — Z20822 Contact with and (suspected) exposure to covid-19: Secondary | ICD-10-CM

## 2019-01-13 MED ORDER — BENZONATATE 100 MG PO CAPS: 100.0000 mg | ORAL_CAPSULE | Freq: Three times a day (TID) | ORAL | 0 refills | Status: DC | PRN

## 2019-01-13 NOTE — Progress Notes (Signed)
E-Visit for Corona Virus Screening   Your current symptoms could be consistent with the coronavirus.  Call your health care provider or local health department to request and arrange formal testing. Many health care providers can now test patients at their office but not all are.  Please quarantine yourself while awaiting your test results.  [place] [phone], [place], [place] [phone] or visit [email or web address]  and You have been enrolled in [place] for COVID-19.  Daily you will receive a questionnaire within the [facility] website. Our COVID-19 response team will be monitoring your responses daily.   Approximately 5 minutes was spent documenting and reviewing patient's chart.      COVID-19 is a respiratory illness with symptoms that are similar to the flu. Symptoms are typically mild to moderate, but there have been cases of severe illness and death due to the virus. The following symptoms may appear 2-14 days after exposure: . Fever . Cough . Shortness of breath or difficulty breathing . Chills . Repeated shaking with chills . Muscle pain . Headache . Sore throat . New loss of taste or smell . Fatigue . Congestion or runny nose . Nausea or vomiting . Diarrhea  It is vitally important that if you feel that you have an infection such as this virus or any other virus that you stay home and away from places where you may spread it to others.  You should self-quarantine for 14 days if you have symptoms that could potentially be coronavirus or have been in close contact a with a person diagnosed with COVID-19 within the last 2 weeks. You should avoid contact with people age 63 and older.   You should wear a mask or cloth face covering over your nose and mouth if you must be around other people or animals, including pets (even  at home). Try to stay at least 6 feet away from other people. This will protect the people around you.  You can use medication such as A prescription cough medication called Tessalon Perles 100 mg. You may take 1-2 capsules every 8 hours as needed for cough  You may also take acetaminophen (Tylenol) as needed for fever.   Reduce your risk of any infection by using the same precautions used for avoiding the common cold or flu:  Marland Kitchen Wash your hands often with soap and warm water for at least 20 seconds.  If soap and water are not readily available, use an alcohol-based hand sanitizer with at least 60% alcohol.  . If coughing or sneezing, cover your mouth and nose by coughing or sneezing into the elbow areas of your shirt or coat, into a tissue or into your sleeve (not your hands). . Avoid shaking hands with others and consider head nods or verbal greetings only. . Avoid touching your eyes, nose, or mouth with unwashed hands.  . Avoid close contact with people who are sick. . Avoid places or events with large numbers of people in one location, like concerts or sporting events. . Carefully consider travel plans you have or are making. . If you are planning any travel outside or inside the [place], visit the CDC's Travelers' Health webpage for the latest health notices. . If you have some symptoms but not all symptoms, continue to monitor at home and seek medical attention if your symptoms worsen. . If you are having a medical emergency, call 911.  HOME CARE . Only take medications as instructed  by your medical team. . Drink plenty of fluids and get plenty of rest. . A steam or ultrasonic humidifier can help if you have congestion.   GET HELP RIGHT AWAY IF YOU HAVE EMERGENCY WARNING SIGNS** FOR COVID-19. If you or someone is showing any of these signs seek emergency medical care immediately. Call 911 or proceed to your closest emergency facility if: . You develop worsening high fever. . Trouble  breathing . Bluish lips or face . Persistent pain or pressure in the chest . New confusion . Inability to wake or stay awake . You cough up blood. . Your symptoms become more severe  **This list is not all possible symptoms. Contact your medical provider for any symptoms that are sever or concerning to you.   MAKE SURE YOU   Understand these instructions.  Will watch your condition.  Will get help right away if you are not doing well or get worse.  Your e-visit answers were reviewed by a board certified advanced clinical practitioner to complete your personal care plan.  Depending on the condition, your plan could have included both over the counter or prescription medications.  If there is a problem please reply once you have received a response from your provider.  Your safety is important to [place].  If you have drug allergies check your prescription carefully.    You can use MyChart to ask questions about today's visit, request a non-urgent call back, or ask for a work or school excuse for 24 hours related to this e-Visit. If it has been greater than 24 hours you will need to follow up with your provider, or enter a new e-Visit to address those concerns. You will get an e-mail in the next two days asking about your experience.  I hope that your e-visit has been valuable and will speed your recovery. Thank you for using e-visits.

## 2019-01-14 ENCOUNTER — Other Ambulatory Visit: Payer: Self-pay | Admitting: *Deleted

## 2019-01-14 ENCOUNTER — Other Ambulatory Visit: Payer: Self-pay

## 2019-01-14 DIAGNOSIS — Z20822 Contact with and (suspected) exposure to covid-19: Secondary | ICD-10-CM

## 2019-01-14 DIAGNOSIS — R6889 Other general symptoms and signs: Secondary | ICD-10-CM | POA: Diagnosis not present

## 2019-01-15 ENCOUNTER — Encounter (INDEPENDENT_AMBULATORY_CARE_PROVIDER_SITE_OTHER): Payer: Self-pay

## 2019-01-16 ENCOUNTER — Encounter (INDEPENDENT_AMBULATORY_CARE_PROVIDER_SITE_OTHER): Payer: Self-pay

## 2019-01-17 LAB — NOVEL CORONAVIRUS, NAA: SARS-CoV-2, NAA: NOT DETECTED

## 2019-01-18 ENCOUNTER — Encounter (INDEPENDENT_AMBULATORY_CARE_PROVIDER_SITE_OTHER): Payer: Self-pay

## 2019-01-19 ENCOUNTER — Encounter (INDEPENDENT_AMBULATORY_CARE_PROVIDER_SITE_OTHER): Payer: Self-pay

## 2019-01-23 ENCOUNTER — Encounter (INDEPENDENT_AMBULATORY_CARE_PROVIDER_SITE_OTHER): Payer: Self-pay

## 2019-01-23 ENCOUNTER — Other Ambulatory Visit: Payer: Self-pay

## 2019-01-25 ENCOUNTER — Encounter (INDEPENDENT_AMBULATORY_CARE_PROVIDER_SITE_OTHER): Payer: Self-pay

## 2019-01-31 ENCOUNTER — Other Ambulatory Visit: Payer: Self-pay | Admitting: Internal Medicine

## 2019-02-01 ENCOUNTER — Other Ambulatory Visit: Payer: Self-pay | Admitting: Internal Medicine

## 2019-02-01 NOTE — Telephone Encounter (Signed)
Refilled: 01/02/2019 Last OV: 11/07/2018 Next OV: 02/15/2019

## 2019-02-11 ENCOUNTER — Other Ambulatory Visit: Payer: Self-pay

## 2019-02-11 ENCOUNTER — Ambulatory Visit: Payer: 59 | Admitting: Internal Medicine

## 2019-02-12 ENCOUNTER — Other Ambulatory Visit: Payer: Self-pay | Admitting: Internal Medicine

## 2019-02-12 ENCOUNTER — Other Ambulatory Visit: Payer: Self-pay

## 2019-02-12 ENCOUNTER — Other Ambulatory Visit (INDEPENDENT_AMBULATORY_CARE_PROVIDER_SITE_OTHER): Payer: 59

## 2019-02-12 DIAGNOSIS — R7303 Prediabetes: Secondary | ICD-10-CM

## 2019-02-12 DIAGNOSIS — E782 Mixed hyperlipidemia: Secondary | ICD-10-CM | POA: Diagnosis not present

## 2019-02-12 DIAGNOSIS — K760 Fatty (change of) liver, not elsewhere classified: Secondary | ICD-10-CM

## 2019-02-12 LAB — LIPID PANEL
Cholesterol: 161 mg/dL (ref 0–200)
HDL: 41.7 mg/dL (ref >39.00)
NonHDL: 119.22
Total CHOL/HDL Ratio: 4
Triglycerides: 229 mg/dL — ABNORMAL HIGH (ref 0.0–149.0)
VLDL: 45.8 mg/dL — ABNORMAL HIGH (ref 0.0–40.0)

## 2019-02-12 LAB — HEMOGLOBIN A1C: Hgb A1c MFr Bld: 6.1 % (ref 4.6–6.5)

## 2019-02-12 LAB — COMPREHENSIVE METABOLIC PANEL
ALT: 54 U/L — ABNORMAL HIGH (ref 0–35)
AST: 30 U/L (ref 0–37)
Albumin: 4.6 g/dL (ref 3.5–5.2)
Alkaline Phosphatase: 56 U/L (ref 39–117)
BUN: 18 mg/dL (ref 6–23)
CO2: 29 mEq/L (ref 19–32)
Calcium: 9.7 mg/dL (ref 8.4–10.5)
Chloride: 103 mEq/L (ref 96–112)
Creatinine, Ser: 0.82 mg/dL (ref 0.40–1.20)
GFR: 71.58 mL/min (ref >60.00)
Glucose, Bld: 110 mg/dL — ABNORMAL HIGH (ref 70–99)
Potassium: 4.4 mEq/L (ref 3.5–5.1)
Sodium: 140 mEq/L (ref 135–145)
Total Bilirubin: 0.4 mg/dL (ref 0.2–1.2)
Total Protein: 7.3 g/dL (ref 6.0–8.3)

## 2019-02-12 LAB — LDL CHOLESTEROL, DIRECT: Direct LDL: 92 mg/dL

## 2019-02-12 NOTE — Telephone Encounter (Signed)
Refilled: 02/02/2019 Last OV: 11/07/2018 Next OV: 02/15/2019

## 2019-02-15 ENCOUNTER — Other Ambulatory Visit: Payer: Self-pay

## 2019-02-15 ENCOUNTER — Encounter: Payer: Self-pay | Admitting: Internal Medicine

## 2019-02-15 ENCOUNTER — Ambulatory Visit (INDEPENDENT_AMBULATORY_CARE_PROVIDER_SITE_OTHER): Payer: 59 | Admitting: Internal Medicine

## 2019-02-15 DIAGNOSIS — K760 Fatty (change of) liver, not elsewhere classified: Secondary | ICD-10-CM

## 2019-02-15 DIAGNOSIS — M1612 Unilateral primary osteoarthritis, left hip: Secondary | ICD-10-CM | POA: Diagnosis not present

## 2019-02-15 DIAGNOSIS — M5431 Sciatica, right side: Secondary | ICD-10-CM

## 2019-02-15 DIAGNOSIS — R7303 Prediabetes: Secondary | ICD-10-CM

## 2019-02-15 MED ORDER — METFORMIN HCL 500 MG PO TABS: 500.0000 mg | ORAL_TABLET | Freq: Two times a day (BID) | ORAL | 3 refills | Status: DC

## 2019-02-15 NOTE — Progress Notes (Addendum)
Virtual Visit via Doxy.me Note  This visit type was conducted due to national recommendations for restrictions regarding the COVID-19 pandemic (e.g. social distancing).  This format is felt to be most appropriate for this patient at this time.  All issues noted in this document were discussed and addressed.  No physical exam was performed (except for noted visual exam findings with Video Visits).   I connected with@ on 02/15/19 at  8:00 AM EDT by a video enabled telemedicine application and verified that I am speaking with the correct person using two identifiers. Location patient: home Location provider: work or home office Persons participating in the virtual visit: patient, provider  I discussed the limitations, risks, security and privacy concerns of performing an evaluation and management service by telephone and the availability of in person appointments. I also discussed with the patient that there may be a patient responsible charge related to this service. The patient expressed understanding and agreed to proceed. 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  s  .  Not taking celebrex sine it didn't help. Using oxycodone for nighttime relief of pain and tramadol  For daytime.  Her pain was recently complicated by a fall through a weak spot in  her pool deck.  She bruised her entire left side, and has a large hematoma involving  Her left thigh.  Fatty liver:  Has not lost any weight due to her activity limitations . We discussed adding metformin and vitamin D and continued  adherence to low GI diet.   ROS: See pertinent positives and negatives per HPI.  Past Medical History:  Diagnosis Date  . Anxiety   . Arthritis   . Depression   . Diverticulosis of intestine 2010   iftikhar  . Esophagitis, reflux 2010    EGD by [facility]tkihar, managed wiht dexilant  . GERD (gastroesophageal reflux disease)   . Impaired fasting glucose 01/25/2012   Needs eye exam.      Past Surgical History:  Procedure Laterality Date  . ABDOMINAL HYSTERECTOMY  2009   supracervical   . APPENDECTOMY    . BREAST CYST ASPIRATION Left    lt fna-neg  . COLONOSCOPY WITH PROPOFOL N/A 06/05/2015   Procedure: COLONOSCOPY WITH PROPOFOL;  Surgeon: Lucilla Lame, MD;  Location: [facility];  Service: Endoscopy;  Laterality: N/A;  Pt requests early. Pt for antibiotic pre-procedure  CECAL PROTRUSION BX. / WITH CLIP LOT # [id number] RESOLUTION 360 EXP 03/03/2016 SIGMOID POLYP  . JOINT REPLACEMENT Right 2012   hip, [name]  . TOTAL HIP ARTHROPLASTY Left 05/02/2018   Procedure: TOTAL HIP ARTHROPLASTY;  Surgeon: Dereck Leep, MD;  Location: [facility];  Service: Orthopedics;  Laterality: Left;    Family History  Problem Relation Age of Onset  . Cancer Father        lung ca  . Hyperlipidemia Father        PAD  . Diabetes Sister   . Cancer Paternal Aunt        breast  . Breast  cancer Paternal Aunt 17  . Diabetes Maternal Grandfather   . Cancer Maternal Grandfather        colon ca  . Cancer Paternal Grandmother        colon  . Cancer Paternal Grandfather        colon ca  . Heart disease Neg Hx     SOCIAL HX:  reports that she has been smoking cigarettes. She has a 35.00 pack-year smoking history. She has never used smokeless tobacco. She reports current alcohol use. She reports that she does not use drugs.  Current Outpatient Medications:  .  aspirin EC 81 MG tablet, Take 81 mg by mouth daily., Disp: , Rfl:  .  cetirizine (ZYRTEC) 10 MG tablet, Take 10 mg by mouth daily. ,  Disp: , Rfl:  .  conjugated estrogens (PREMARIN) vaginal cream, Place one applicatorful vaginally two to three times weekly, Disp: 42.5 g, Rfl: 12 .  DULoxetine (CYMBALTA) 20 MG capsule, Take 1 capsule (20 mg total) by mouth daily after supper., Disp: 90 capsule, Rfl: 1 .  famotidine (PEPCID) 20 MG tablet, Take 1 tablet (20 mg total) by mouth at bedtime., Disp: 30 tablet, Rfl: 5 .  Omega-3 Fatty Acids (FISH OIL) 1000 MG CAPS, Take 1,000 mg by mouth daily. , Disp: , Rfl:  .  oxyCODONE (OXY IR/ROXICODONE) 5 MG immediate release tablet, TAKE ONE TABLET THREE TIMES A DAY AS NEEDED FOR SEVERE PAIN, Disp: 90 tablet, Rfl: 0 .  pantoprazole (PROTONIX) 40 MG tablet, Take 1 tablet (40 mg total) by mouth daily., Disp: 30 tablet, Rfl: 5 .  simvastatin (ZOCOR) 20 MG tablet, Take 1 tablet (20 mg total) by mouth at bedtime., Disp: 90 tablet, Rfl: 1 .  spironolactone (ALDACTONE) 50 MG tablet, Take 1 tablet (50 mg total) by mouth daily., Disp: 90 tablet, Rfl: 1 .  traMADol (ULTRAM) 50 MG tablet, TAKE 1 TO 2 TABLETS BY MOUTH EVERY 6 HOURS AS NEEDED FOR SEVERE BACK PAIN. MAX OF 6 PER DAY., Disp: 180 tablet, Rfl: 5 .  citalopram (CELEXA) 40 MG tablet, Take 40 mg by mouth daily., Disp: , Rfl:  .  metFORMIN (GLUCOPHAGE) 500 MG tablet, Take 1 tablet (500 mg total) by mouth 2 (two) times daily with a meal., Disp: 180 tablet, Rfl: 3  EXAM:  VITALS per patient if applicable:  GENERAL: alert, oriented, appears well and in no acute distress  HEENT: atraumatic, conjunttiva clear, no obvious abnormalities on inspection of external nose and ears  NECK: normal movements of the head and neck  LUNGS: on inspection no signs of respiratory distress, breathing rate appears normal, no obvious gross SOB, gasping or wheezing  CV: no obvious cyanosis  MS: moves all visible extremities without noticeable abnormality  PSYCH/NEURO: pleasant and cooperative, no obvious depression or anxiety, speech and thought processing grossly  intact  ASSESSMENT AND PLAN:  Discussed the following assessment and plan:  Sciatica Persistent despite conservative treatment.  MRI notes L4 nerve root abutting a bulging disk , but Neurosurgery has not offered definitive treatment. 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  Weight loss strongly advised,  Stop celebrex given lack pf perceivable effects and continue tramadol regularly and 10 mg of oxycodone daily for nocturnal pain. Continue cymbalta for management of depressive symptoms brought on by chronic pain  And reduced doseof  Citalopram   at 20 mg daily.  Narcotics Refill history confirmed via [place] Controlled Substance databas, accessed by me today..  Hepatic steatosis suggested by July 2016 ultrasound and negative serologies for autoimmune etiologies  HER ALT has been persistently mildly  elevated (<2 x ULN)  since 2013, she has resumed simvastatin  . Recommending trial of metformin and vitamin E as well   Lab Results  Component Value Date   ALT 54 (H) 02/12/2019   AST 30 02/12/2019   ALKPHOS 56 02/12/2019   BILITOT 0.4 02/12/2019     Prediabetes her A1c  Was normalized in Dec 2019 with  low glycemic index diet but has risen again due to dietary and lifestyle issues. Marland Kitchen  encouraged to resume regular participation swimming   Lab Results  Component Value Date   HGBA1C 6.1 02/12/2019     Primary osteoarthritis of left hip She states that Dr. Marry Guan feels that her persistent pain despite THR is referred from her lumbar spinal radiculopathy    I discussed the assessment and treatment plan with the patient. The patient was provided an opportunity to ask questions and all were answered. The patient agreed with the plan and demonstrated an understanding of the instructions.   The patient was advised to call back or seek an in-person evaluation if the symptoms worsen or  if the condition fails to improve as anticipated.  I provided 25 minutes of non-face-to-face time during this encounter.   Crecencio Mc, MD

## 2019-02-17 MED ORDER — OXYCODONE HCL 5 MG PO TABS: ORAL_TABLET | ORAL | 0 refills | Status: DC

## 2019-02-17 NOTE — Assessment & Plan Note (Signed)
her A1c  Was normalized in Dec 2019 with  low glycemic index diet but has risen again due to dietary and lifestyle issues. Marland Kitchen  encouraged to resume regular participation swimming   Lab Results  Component Value Date   HGBA1C 6.1 02/12/2019

## 2019-02-17 NOTE — Assessment & Plan Note (Signed)
suggested by July 2016 ultrasound and negative serologies for autoimmune etiologies  HER ALT has been persistently mildly  elevated (<2 x ULN)  since 2013, she has resumed simvastatin  . Recommending trial of metformin and vitamin E as well   Lab Results  Component Value Date   ALT 54 (H) 02/12/2019   AST 30 02/12/2019   ALKPHOS 56 02/12/2019   BILITOT 0.4 02/12/2019

## 2019-02-17 NOTE — Assessment & Plan Note (Addendum)
Persistent despite conservative treatment.  MRI notes L4 nerve root abutting a bulging disk , but Neurosurgery has not offered definitive treatment. 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  Weight loss strongly advised,  Stop celebrex given lack pf perceivable effects and continue tramadol regularly and 10 mg of oxycodone daily for nocturnal pain. Continue cymbalta for management of depressive symptoms brought on by chronic pain  And reduced doseof  Citalopram   at 20 mg daily.  Narcotics Refill history confirmed via [place] Controlled Substance databas, accessed by me today.Marland Kitchen

## 2019-03-14 ENCOUNTER — Other Ambulatory Visit: Payer: Self-pay

## 2019-03-14 NOTE — Telephone Encounter (Signed)
Refilled

## 2019-03-15 ENCOUNTER — Other Ambulatory Visit: Payer: Self-pay | Admitting: Internal Medicine

## 2019-03-15 NOTE — Assessment & Plan Note (Signed)
She states that Dr. Marry Guan feels that her persistent pain despite THR is referred from her lumbar spinal radiculopathy

## 2019-04-22 ENCOUNTER — Other Ambulatory Visit: Payer: Self-pay | Admitting: Internal Medicine

## 2019-05-10 ENCOUNTER — Other Ambulatory Visit: Payer: Self-pay | Admitting: Internal Medicine

## 2019-05-10 NOTE — Telephone Encounter (Signed)
Tramadol   Refilled: 11/07/2018 Oxycodone   Refilled: 03/14/2019  Last OV: 02/15/2019 Next OV: 05/17/2019

## 2019-05-10 NOTE — Telephone Encounter (Signed)
Refills sent

## 2019-05-15 ENCOUNTER — Other Ambulatory Visit: Payer: Self-pay

## 2019-05-17 ENCOUNTER — Encounter: Payer: Self-pay | Admitting: Internal Medicine

## 2019-05-17 ENCOUNTER — Ambulatory Visit (INDEPENDENT_AMBULATORY_CARE_PROVIDER_SITE_OTHER): Payer: 59 | Admitting: Internal Medicine

## 2019-05-17 DIAGNOSIS — M5431 Sciatica, right side: Secondary | ICD-10-CM | POA: Diagnosis not present

## 2019-05-17 DIAGNOSIS — Z7189 Other specified counseling: Secondary | ICD-10-CM

## 2019-05-17 DIAGNOSIS — K2100 Gastro-esophageal reflux disease with esophagitis, without bleeding: Secondary | ICD-10-CM

## 2019-05-17 DIAGNOSIS — E1169 Type 2 diabetes mellitus with other specified complication: Secondary | ICD-10-CM

## 2019-05-17 DIAGNOSIS — E669 Obesity, unspecified: Secondary | ICD-10-CM | POA: Diagnosis not present

## 2019-05-17 MED ORDER — OMEPRAZOLE 20 MG PO CPDR: 20.0000 mg | DELAYED_RELEASE_CAPSULE | Freq: Two times a day (BID) | ORAL | 3 refills | Status: DC

## 2019-05-17 NOTE — Patient Instructions (Signed)
Changing pantoprazole to omeprazole twice daily  Take the pepcid right before a meal   Food Choices for Gastroesophageal Reflux Disease, Adult When you have gastroesophageal reflux disease (GERD), the foods you eat and your eating habits are very important. Choosing the right foods can help ease your discomfort. Think about working with a nutrition specialist (dietitian) to help you make good choices. What are tips for following this plan?  Meals  Choose healthy foods that are low in fat, such as fruits, vegetables, whole grains, low-fat dairy products, and lean meat, fish, and poultry.  Eat small meals often instead of 3 large meals a day. Eat your meals slowly, and in a place where you are relaxed. Avoid bending over or lying down until 2-3 hours after eating.  Avoid eating meals 2-3 hours before bed.  Avoid drinking a lot of liquid with meals.  Cook foods using methods other than frying. Bake, grill, or broil food instead.  Avoid or limit: ? Chocolate. ? Peppermint or spearmint. ? Alcohol. ? Pepper. ? Black and decaffeinated coffee. ? Black and decaffeinated tea. ? Bubbly (carbonated) soft drinks. ? Caffeinated energy drinks and soft drinks.  Limit high-fat foods such as: ? Fatty meat or fried foods. ? Whole milk, cream, butter, or ice cream. ? Nuts and nut butters. ? Pastries, donuts, and sweets made with butter or shortening.  Avoid foods that cause symptoms. These foods may be different for everyone. Common foods that cause symptoms include: ? Tomatoes. ? Oranges, lemons, and limes. ? Peppers. ? Spicy food. ? Onions and garlic. ? Vinegar. Lifestyle  Maintain a healthy weight. Ask your doctor what weight is healthy for you. If you need to lose weight, work with your doctor to do so safely.  Exercise for at least 30 minutes for 5 or more days each week, or as told by your doctor.  Wear loose-fitting clothes.  Do not smoke. If you need help quitting, ask your  doctor.  Sleep with the head of your bed higher than your feet. Use a wedge under the mattress or blocks under the bed frame to raise the head of the bed. Summary  When you have gastroesophageal reflux disease (GERD), food and lifestyle choices are very important in easing your symptoms.  Eat small meals often instead of 3 large meals a day. Eat your meals slowly, and in a place where you are relaxed.  Limit high-fat foods such as fatty meat or fried foods.  Avoid bending over or lying down until 2-3 hours after eating.  Avoid peppermint and spearmint, caffeine, alcohol, and chocolate. This information is not intended to replace advice given to you by your health care provider. Make sure you discuss any questions you have with your health care provider. Document Released: 01/03/2012 Document Revised: 10/25/2018 Document Reviewed: 08/09/2016 Elsevier Patient Education  2020 [employer].

## 2019-05-17 NOTE — Progress Notes (Addendum)
Virtual Visit via [email or web address]  This visit type was conducted due to national recommendations for restrictions regarding the COVID-19 pandemic (e.g. social distancing).  This format is felt to be most appropriate for this patient at this time.  All issues noted in this document were discussed and addressed.  No physical exam was performed (except for noted visual exam findings with Video Visits).   I connected with@ on 05/17/19 at  2:00 PM EDT by a video enabled telemedicine application and verified that I am speaking with the correct person using two identifiers. Location patient: home Location provider: work or home office Persons participating in the virtual visit: patient, provider  I discussed the limitations, risks, security and privacy concerns of performing an evaluation and management service by telephone and the availability of in person appointments. I also discussed with the patient that there may be a patient responsible charge related to this service. The patient expressed understanding and agreed to proceed.   Reason for visit:  Follow up on  chronic pain  Recent indirect COVID EXPOSURE NEEDING ADVICE   HPI:   Indirect covid exposure friend  of mother's  Contact with mother on Wednesday . Mother played bridge with grandmother of infected patinet on Mondayy  Reflux not controlled on 40 mg qm protonix and 20 mg famotidine qpm.  Using Tums,  Head OF BED  Elevation,  AND  pepcid.     CHRONIC LOW BACK PAIN: SHE HAS HAD NO IMPROVEMENT IN PAIN.  Workup this far reviewed .  Refill history confirmed via [place] Controlled Substance databas, accessed by me today..Needs refills for 3 months starting nov 29    Prediabetes : the patient feels generally well, is walking several times per week for exercise and weighing weekly .  Following a carbohydrate modified diet 5 days per week. Has lost 7 lbs , and has no symptoms suggestive of diabetes (polyuria, polyphagia, polydipsia).     ROS: See  pertinent positives and negatives per HPI.  Past Medical History:  Diagnosis Date  . Anxiety   . Arthritis   . Depression   . Diverticulosis of intestine 2010   iftikhar  . Esophagitis, reflux 2010    EGD by ift[facility], managed wiht dexilant  . GERD (gastroesophageal reflux disease)   . Impaired fasting glucose 01/25/2012   Needs eye exam.      Past Surgical History:  Procedure Laterality Date  . ABDOMINAL HYSTERECTOMY  2009   supracervical   . APPENDECTOMY    . BREAST CYST ASPIRATION Left    lt fna-neg  . COLONOSCOPY WITH PROPOFOL N/A 06/05/2015   Procedure: COLONOSCOPY WITH PROPOFOL;  Surgeon: Lucilla Lame, MD;  Location: [place];  Service: Endoscopy;  Laterality: N/A;  Pt requests early. Pt for antibiotic pre-procedure  CECAL PROTRUSION BX. / WITH CLIP LOT # [id number] RESOLUTION 360 EXP 03/03/2016 SIGMOID POLYP  . JOINT REPLACEMENT Right 2012   hip, [name]  . TOTAL HIP ARTHROPLASTY Left 05/02/2018   Procedure: TOTAL HIP ARTHROPLASTY;  Surgeon: Dereck Leep, MD;  Location: [facility];  Service: Orthopedics;  Laterality: Left;    Family History  Problem Relation Age of Onset  . Cancer Father        lung ca  . Hyperlipidemia Father        PAD  . Diabetes Sister   . Cancer Paternal Aunt        breast  . Breast cancer Paternal Aunt 53  . Diabetes Maternal Grandfather   . Cancer  Maternal Grandfather        colon ca  . Cancer Paternal Grandmother        colon  . Cancer Paternal Grandfather        colon ca  . Heart disease Neg Hx     SOCIAL HX:  reports that she has been smoking cigarettes. She has a 35.00 pack-year smoking history. She has never used smokeless tobacco. She does not drink alcohol.   She reports that she does not use drugs.   Current Outpatient Medications:  .  aspirin EC 81 MG tablet, Take 81 mg by mouth daily., Disp: , Rfl:  .  cetirizine (ZYRTEC) 10 MG tablet, Take 10 mg by mouth daily. , Disp: , Rfl:  .  conjugated estrogens  (PREMARIN) vaginal cream, Place one applicatorful vaginally two to three times weekly, Disp: 42.5 g, Rfl: 12 .  DULoxetine (CYMBALTA) 20 MG capsule, Take 1 capsule (20 mg total) by mouth daily after supper., Disp: 90 capsule, Rfl: 1 .  famotidine (PEPCID) 40 MG tablet, TAKE ONE-HALF TABLET BY MOUTH AT BEDTIME, Disp: 15 tablet, Rfl: 5 .  metFORMIN (GLUCOPHAGE) 500 MG tablet, Take 1 tablet (500 mg total) by mouth 2 (two) times daily with a meal., Disp: 180 tablet, Rfl: 3 .  Omega-3 Fatty Acids (FISH OIL) 1000 MG CAPS, Take 1,000 mg by mouth daily. , Disp: , Rfl:  .  oxyCODONE (OXY IR/ROXICODONE) 5 MG immediate release tablet, TAKE 1 TABLET BY MOUTH 3 TIMES DAILY AS NEEDED FOR SEVERE PAIN, Disp: 90 tablet, Rfl: 0 .  pantoprazole (PROTONIX) 40 MG tablet, Take 1 tablet (40 mg total) by mouth daily., Disp: 30 tablet, Rfl: 5 .  simvastatin (ZOCOR) 20 MG tablet, Take 1 tablet (20 mg total) by mouth at bedtime., Disp: 90 tablet, Rfl: 1 .  spironolactone (ALDACTONE) 50 MG tablet, Take 1 tablet (50 mg total) by mouth daily., Disp: 90 tablet, Rfl: 1 .  traMADol (ULTRAM) 50 MG tablet, TAKE 1-2 TABLTS BY MOUTH EVERY 6 HOURS AS NEEDED FOR SEVERE BACK PAIN MAX OF 6/DAY, Disp: 180 tablet, Rfl: 3 .  citalopram (CELEXA) 40 MG tablet, Take 40 mg by mouth daily., Disp: , Rfl:  .  famotidine (PEPCID) 20 MG tablet, Take 1 tablet (20 mg total) by mouth at bedtime., Disp: 30 tablet, Rfl: 5 .  omeprazole (PRILOSEC) 20 MG capsule, Take 1 capsule (20 mg total) by mouth 2 (two) times daily before a meal., Disp: 60 capsule, Rfl: 3  EXAM:  VITALS per patient if applicable:  GENERAL: alert, oriented, appears well and in no acute distress  HEENT: atraumatic, conjunttiva clear, no obvious abnormalities on inspection of external nose and ears  NECK: normal movements of the head and neck  LUNGS: on inspection no signs of respiratory distress, breathing rate appears normal, no obvious gross SOB, gasping or wheezing  CV: no  obvious cyanosis  MS: moves all visible extremities without noticeable abnormality  PSYCH/NEURO: pleasant and cooperative, no obvious depression or anxiety, speech and thought processing grossly intact  ASSESSMENT AND PLAN:  Discussed the following assessment and plan:  Advice given about COVID-19 virus infection  Sciatica of right side  Gastroesophageal reflux disease with esophagitis without hemorrhage  Diabetes mellitus type 2 in obese ([place])  Advice given about COVID-19 virus infection Advised to defer testing until 5 days after contact with a POSITIVE EXPOSURE  Sciatica Persistent despite conservative treatment.  MRI notes L4 nerve root abutting a bulging disk , but Neurosurgery has not  offered definitive treatment. 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  Weight loss strongly advised, continue tramadol regularly for daytime pain at a rate of  10 mg of oxycodone thrice  daily for nocturnal pain. Continue cymbalta for management of depressive symptoms brought on by chronic pain  And reduced doseof  Citalopram   at 20 mg daily.  Narcotics Refill history confirmed via [place] Controlled Substance databas, accessed by me today..  Reflux esophagitis Symptoms not controlled on daily PPI ; Gi referralmade   Diabetes mellitus type 2 in obese ([facility]) Recent fasting  glucose is elevated and  diagnostic of diabetes.  Based on her  A1c,   SHE DOES NOT need medications at this time.  She was encouraged to schedule an annual eye exam and to make  lifestyle changes including low glycemic index diet , weight loss (goal BMI < 30) , participate regularly in aerobic  Exercise, and to follow up in 6 months.  Lab Results  Component Value Date   HGBA1C 6.1 02/12/2019  '    I discussed the assessment and treatment plan with the patient. The patient was provided an opportunity to ask  questions and all were answered. The patient agreed with the plan and demonstrated an understanding of the instructions.   The patient was advised to call back or seek an in-person evaluation if the symptoms worsen or if the condition fails to improve as anticipated.   I provided  25 minutes of non-face-to-face time during this encounter reviewing patient's current problems and post surgeries.  Providing counseling on the above mentioned problems , and coordination  of care .   Crecencio Mc, MD

## 2019-05-19 DIAGNOSIS — Z7189 Other specified counseling: Secondary | ICD-10-CM | POA: Insufficient documentation

## 2019-05-19 DIAGNOSIS — E1169 Type 2 diabetes mellitus with other specified complication: Secondary | ICD-10-CM | POA: Insufficient documentation

## 2019-05-19 NOTE — Assessment & Plan Note (Signed)
Advised to defer testing until 5 days after contact with a POSITIVE EXPOSURE

## 2019-05-19 NOTE — Assessment & Plan Note (Signed)
Symptoms not controlled on daily PPI ; Gi referralmade

## 2019-05-19 NOTE — Assessment & Plan Note (Signed)
Persistent despite conservative treatment.  MRI notes L4 nerve root abutting a bulging disk , but Neurosurgery has not offered definitive treatment. 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  Weight loss strongly advised, continue tramadol regularly for daytime pain at a rate of  10 mg of oxycodone thrice  daily for nocturnal pain. Continue cymbalta for management of depressive symptoms brought on by chronic pain  And reduced doseof  Citalopram   at 20 mg daily.  Narcotics Refill history confirmed via [place] Controlled Substance databas, accessed by me today.Lisa Valencia

## 2019-05-19 NOTE — Assessment & Plan Note (Signed)
Recent fasting  glucose is elevated and  diagnostic of diabetes.  Based on her  A1c,   SHE DOES NOT need medications at this time.  She was encouraged to schedule an annual eye exam and to make  lifestyle changes including low glycemic index diet , weight loss (goal BMI < 30) , participate regularly in aerobic  Exercise, and to follow up in 6 months.  Lab Results  Component Value Date   HGBA1C 6.1 02/12/2019  '

## 2019-05-26 MED ORDER — CELECOXIB 200 MG PO CAPS: 200.0000 mg | ORAL_CAPSULE | Freq: Two times a day (BID) | ORAL | 3 refills | Status: DC

## 2019-06-11 ENCOUNTER — Other Ambulatory Visit: Payer: Self-pay | Admitting: Internal Medicine

## 2019-06-18 ENCOUNTER — Other Ambulatory Visit: Payer: Self-pay | Admitting: Internal Medicine

## 2019-06-19 ENCOUNTER — Other Ambulatory Visit: Payer: Self-pay

## 2019-06-19 MED ORDER — OXYCODONE HCL 5 MG PO TABS: ORAL_TABLET | ORAL | 0 refills | Status: DC

## 2019-06-19 NOTE — Telephone Encounter (Signed)
Refilled: 05/16/2019 Last OV: 05/17/2019 Next OV: 08/19/2019

## 2019-07-16 MED ORDER — OXYCODONE HCL 5 MG PO TABS: ORAL_TABLET | ORAL | 0 refills | Status: DC

## 2019-07-20 ENCOUNTER — Other Ambulatory Visit: Payer: Self-pay | Admitting: Internal Medicine

## 2019-08-14 ENCOUNTER — Telehealth: Payer: Self-pay | Admitting: Internal Medicine

## 2019-08-14 NOTE — Telephone Encounter (Signed)
Spoke with pt and she stated that she isn't sure if she has a UTI or not. She stated that yesterday she noticed that her urine had a strong odor but today she has not noticed it. Pt was advised to increase her water intake and if symptoms didn't go away or worsened that she would need to be evaluated by UC. Pt gave a verbal understanding.

## 2019-08-14 NOTE — Telephone Encounter (Signed)
Patient thinks she has a another UTI. No appointments at this time are available.

## 2019-08-14 NOTE — Telephone Encounter (Signed)
Noted  TMS 

## 2019-08-14 NOTE — Telephone Encounter (Signed)
LMTCB

## 2019-08-19 ENCOUNTER — Other Ambulatory Visit: Payer: Self-pay

## 2019-08-19 ENCOUNTER — Encounter: Payer: Self-pay | Admitting: Internal Medicine

## 2019-08-19 ENCOUNTER — Ambulatory Visit (INDEPENDENT_AMBULATORY_CARE_PROVIDER_SITE_OTHER): Payer: 59 | Admitting: Internal Medicine

## 2019-08-19 VITALS — Ht 70.0 in | Wt 290.0 lb

## 2019-08-19 DIAGNOSIS — E1169 Type 2 diabetes mellitus with other specified complication: Secondary | ICD-10-CM

## 2019-08-19 DIAGNOSIS — M5431 Sciatica, right side: Secondary | ICD-10-CM | POA: Diagnosis not present

## 2019-08-19 DIAGNOSIS — R8290 Unspecified abnormal findings in urine: Secondary | ICD-10-CM

## 2019-08-19 DIAGNOSIS — M5136 Other intervertebral disc degeneration, lumbar region: Secondary | ICD-10-CM

## 2019-08-19 DIAGNOSIS — E669 Obesity, unspecified: Secondary | ICD-10-CM

## 2019-08-19 MED ORDER — OXYCODONE HCL 5 MG PO TABS: ORAL_TABLET | ORAL | 0 refills | Status: DC

## 2019-08-19 MED ORDER — METHOCARBAMOL 500 MG PO TABS: 500.0000 mg | ORAL_TABLET | Freq: Four times a day (QID) | ORAL | 0 refills | Status: DC

## 2019-08-19 NOTE — Assessment & Plan Note (Signed)
Chronic since 2016.  Persistent despite conservative treatment.  MRI notes L4 nerve root abutting a bulging disk , but Neurosurgery has not offered definitive treatment. 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  Weight loss achieved with [employer],  Continued loss strongly advised, continue tramadol regularly for daytime pain at a rate of  10 mg of oxycodone thrice  daily for nocturnal pain. Continue cymbalta for management of depressive symptoms brought on by chronic pain  And reduced doseof  Citalopram   at 20 mg daily.  Narcotics Refill history confirmed via [place] Controlled Substance databas, accessed by me today.Marland Kitchen

## 2019-08-19 NOTE — Assessment & Plan Note (Signed)
Treated with diet given recurrent hypoglycemic symptoms with metformin lowest dose. a1c due

## 2019-08-19 NOTE — Assessment & Plan Note (Signed)
With left sided sciatica secondary to L4 nerve root contact with bulging disk .  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  She cannot afford Physical therapy for offloading , but has no surgical options per 2 independent neurosurgical evaluations (Dr Lacinda Axon  And Dr

## 2019-08-19 NOTE — Progress Notes (Signed)
Virtual Visit via [email or web address]  This visit type was conducted due to national recommendations for restrictions regarding the COVID-19 pandemic (e.g. social distancing).  This format is felt to be most appropriate for this patient at this time.  All issues noted in this document were discussed and addressed.  No physical exam was performed (except for noted visual exam findings with Video Visits).   I connected with@ on 08/19/19 at 10:00 AM EST by a video enabled telemedicine application  and verified that I am speaking with the correct person using two identifiers. Location patient: home Location provider: work or home office Persons participating in the virtual visit: patient, provider  I discussed the limitations, risks, security and privacy concerns of performing an evaluation and management service by telephone and the availability of in person appointments. I also discussed with the patient that there may be a patient responsible charge related to this service. The patient expressed understanding and agreed to proceed.  Reason for visit: follow up on back pain   HPI:  59 yr old female with obesity , fatty liver  T2DM, and sciatica secondary to DDD   Sciatica:  Her  Back is is persistent despite conservative treatment.  She has degenerative changes by lumbar MRI which has caused the L4 nerve root to abut  a bulging disk , but Neurosurgery has not offered definitive treatment. 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  she has had several falls,  One in August (through the pool deck), once while crossing the the threshold of her doorway  .  For the last several weeks  has had increased pain into the left  Groin  And has been in increased pain until the last 2 days .  Still taking celebrex,  Tramadol  And oxycodone.  She has a  history of left hip replacement.     Reflux esophagitis:  Symptoms resolved with use of omeprazole only,   Taken 30 minutes  Before meals twice daily .  T2DM: stopped metformin 2 months ago due to hypoglycemia ,  Occurring only in the  Evening around dinner   Lab Results  Component Value Date   HGBA1C 6.1 02/12/2019   .    ROS: See pertinent positives and negatives per HPI.  Past Medical History:  Diagnosis Date  . Anxiety   . Arthritis   . Depression   . Diverticulosis of intestine 2010   iftikhar  . Esophagitis, reflux 2010    EGD by iftkihar, managed wiht dexilant  . GERD (gastroesophageal reflux disease)   . Impaired fasting glucose 01/25/2012   Needs eye exam.      Past Surgical History:  Procedure Laterality Date  . ABDOMINAL HYSTERECTOMY  2009   supracervical   . APPENDECTOMY    . BREAST CYST ASPIRATION Left    lt fna-neg  . COLONOSCOPY WITH PROPOFOL N/A 06/05/2015   Procedure: COLONOSCOPY WITH PROPOFOL;  Surgeon: Lucilla Lame, MD;  Location: [facility];  Service: Endoscopy;  Laterality: N/A;  Pt requests early. Pt for antibiotic pre-procedure  CECAL PROTRUSION BX. / WITH CLIP LOT # [id number] RESOLUTION 360 EXP 03/03/2016 SIGMOID POLYP  . JOINT REPLACEMENT Right 2012   hip, [name]  . TOTAL HIP ARTHROPLASTY Left 05/02/2018   Procedure: TOTAL HIP ARTHROPLASTY;  Surgeon: Dereck Leep,  MD;  Location: [facility];  Service: Orthopedics;  Laterality: Left;    Family History  Problem Relation Age of Onset  . Cancer Father        lung ca  . Hyperlipidemia Father        PAD  . Diabetes Sister   . Cancer Paternal Aunt        breast  . Breast cancer Paternal Aunt 40  . Diabetes Maternal Grandfather   . Cancer Maternal Grandfather        colon ca  . Cancer Paternal Grandmother        colon  . Cancer Paternal Grandfather        colon ca  . Heart disease Neg Hx     SOCIAL HX:  reports that she has been smoking cigarettes. She has a 35.00 pack-year  smoking history. She has never used smokeless tobacco. She reports previous alcohol use. She reports that she does not use drugs.  Current Outpatient Medications:  .  aspirin EC 81 MG tablet, Take 81 mg by mouth daily., Disp: , Rfl:  .  celecoxib (CELEBREX) 200 MG capsule, Take 1 capsule (200 mg total) by mouth 2 (two) times daily., Disp: 60 capsule, Rfl: 3 .  cetirizine (ZYRTEC) 10 MG tablet, Take 10 mg by mouth daily. , Disp: , Rfl:  .  conjugated estrogens (PREMARIN) vaginal cream, Place one applicatorful vaginally two to three times weekly, Disp: 42.5 g, Rfl: 12 .  DULoxetine (CYMBALTA) 20 MG capsule, TAKE 1 CAPSULE BY MOUTH ONCE DAILY AFTER SUPPER, Disp: 90 capsule, Rfl: 1 .  Omega-3 Fatty Acids (FISH OIL) 1000 MG CAPS, Take 1,000 mg by mouth daily. , Disp: , Rfl:  .  omeprazole (PRILOSEC) 20 MG capsule, Take 1 capsule (20 mg total) by mouth 2 (two) times daily before a meal., Disp: 60 capsule, Rfl: 3 .  oxyCODONE (OXY IR/ROXICODONE) 5 MG immediate release tablet, TAKE 1 TABLET BY MOUTH 3 TIMES DAILY AS NEEDED FOR SEVERE PAIN, Disp: 90 tablet, Rfl: 0 .  [START ON 09/17/2019] oxyCODONE (OXY IR/ROXICODONE) 5 MG immediate release tablet, TAKE 1 TABLET BY MOUTH 3 TIMES DAILY AS NEEDED FOR SEVERE PAIN, Disp: 90 tablet, Rfl: 0 .  [START ON 10/17/2019] oxyCODONE (OXY IR/ROXICODONE) 5 MG immediate release tablet, TAKE 1 TABLET BY MOUTH 3 TIMES DAILY AS NEEDED FOR SEVERE PAIN, Disp: 90 tablet, Rfl: 0 .  simvastatin (ZOCOR) 20 MG tablet, TAKE ONE TABLET AT BEDTIME, Disp: 90 tablet, Rfl: 3 .  spironolactone (ALDACTONE) 50 MG tablet, Take 1 tablet (50 mg total) by mouth daily., Disp: 90 tablet, Rfl: 1 .  traMADol (ULTRAM) 50 MG tablet, TAKE 1-2 TABLTS BY MOUTH EVERY 6 HOURS AS NEEDED FOR SEVERE BACK PAIN MAX OF 6/DAY, Disp: 180 tablet, Rfl: 3 .  methocarbamol (ROBAXIN) 500 MG tablet, Take 1 tablet (500 mg total) by mouth 4 (four) times daily., Disp: 90 tablet, Rfl: 0  EXAM:  VITALS per patient if  applicable:  GENERAL: alert, oriented, appears well and in no acute distress  HEENT: atraumatic, conjunttiva clear, no obvious abnormalities on inspection of external nose and ears  NECK: normal movements of the head and neck  LUNGS: on inspection no signs of respiratory distress, breathing rate appears normal, no obvious gross SOB, gasping or wheezing  CV: no obvious cyanosis  MS: moves all visible extremities without noticeable abnormality  PSYCH/NEURO: pleasant and cooperative, no obvious depression or anxiety, speech and thought processing grossly intact  ASSESSMENT AND PLAN:  Discussed the following assessment and plan:  Diabetes mellitus type 2 in obese ([place]) - Plan: Microalbumin / creatinine urine ratio, Hemoglobin A1c, Comprehensive metabolic panel, Lipid panel  Foul smelling urine - Plan: Urinalysis, Routine w reflex microscopic, Urine Culture  DDD (degenerative disc disease), lumbar  Sciatica of right side  DDD (degenerative disc disease), lumbar With left sided sciatica secondary to L4 nerve root contact with bulging disk .  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  She cannot afford Physical therapy for offloading , but has no surgical options per 2 independent neurosurgical evaluations (Dr Lacinda Axon  And Dr   Sciatica Chronic since 2016.  Persistent despite conservative treatment.  MRI notes L4 nerve root abutting a bulging disk , but Neurosurgery has not offered definitive treatment. 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  Weight loss achieved with [employer],  Continued loss strongly advised, continue tramadol regularly for daytime pain at a rate of  10 mg of oxycodone thrice  daily for nocturnal pain. Continue cymbalta for management of depressive symptoms  brought on by chronic pain  And reduced doseof  Citalopram   at 20 mg daily.  Narcotics Refill history confirmed via [place] Controlled Substance databas, accessed by me today..  Diabetes mellitus type 2 in obese ([place]) Treated with diet given recurrent hypoglycemic symptoms with metformin lowest dose. a1c due   Foul smelling urine Adding urinalysis and culture.  No dysuria     I discussed the assessment and treatment plan with the patient. The patient was provided an opportunity to ask questions and all were answered. The patient agreed with the plan and demonstrated an understanding of the instructions.   The patient was advised to call back or seek an in-person evaluation if the symptoms worsen or if the condition fails to improve as anticipated.  I provided  30 minutes of non-face-to-face time during this encounter reviewing patient's current problems and past procedures/imaging studies, providing counseling on the above mentioned problems , and coordination  of care . Crecencio Mc, MD

## 2019-08-19 NOTE — Assessment & Plan Note (Signed)
Adding urinalysis and culture.  No dysuria

## 2019-08-20 ENCOUNTER — Ambulatory Visit (INDEPENDENT_AMBULATORY_CARE_PROVIDER_SITE_OTHER): Payer: No Typology Code available for payment source | Admitting: Internal Medicine

## 2019-08-20 ENCOUNTER — Other Ambulatory Visit: Payer: Self-pay

## 2019-08-20 ENCOUNTER — Other Ambulatory Visit: Payer: Self-pay | Admitting: Internal Medicine

## 2019-08-20 DIAGNOSIS — E1169 Type 2 diabetes mellitus with other specified complication: Secondary | ICD-10-CM

## 2019-08-20 DIAGNOSIS — E669 Obesity, unspecified: Secondary | ICD-10-CM

## 2019-08-20 DIAGNOSIS — R8290 Unspecified abnormal findings in urine: Secondary | ICD-10-CM | POA: Diagnosis not present

## 2019-08-20 LAB — COMPREHENSIVE METABOLIC PANEL
ALT: 55 U/L — ABNORMAL HIGH (ref 0–35)
AST: 33 U/L (ref 0–37)
Albumin: 4.7 g/dL (ref 3.5–5.2)
Alkaline Phosphatase: 48 U/L (ref 39–117)
BUN: 14 mg/dL (ref 6–23)
CO2: 31 mEq/L (ref 19–32)
Calcium: 10.1 mg/dL (ref 8.4–10.5)
Chloride: 100 mEq/L (ref 96–112)
Creatinine, Ser: 0.82 mg/dL (ref 0.40–1.20)
GFR: 71.45 mL/min (ref >60.00)
Glucose, Bld: 106 mg/dL — ABNORMAL HIGH (ref 70–99)
Potassium: 4.6 mEq/L (ref 3.5–5.1)
Sodium: 138 mEq/L (ref 135–145)
Total Bilirubin: 0.5 mg/dL (ref 0.2–1.2)
Total Protein: 7.5 g/dL (ref 6.0–8.3)

## 2019-08-20 LAB — MICROALBUMIN / CREATININE URINE RATIO
Creatinine,U: 79.4 mg/dL
Microalb Creat Ratio: 2 mg/g (ref 0.0–30.0)
Microalb, Ur: 1.6 mg/dL (ref 0.0–1.9)

## 2019-08-20 LAB — LDL CHOLESTEROL, DIRECT: Direct LDL: 115 mg/dL

## 2019-08-20 LAB — URINALYSIS, ROUTINE W REFLEX MICROSCOPIC
Bilirubin Urine: NEGATIVE
Hgb urine dipstick: NEGATIVE
Ketones, ur: NEGATIVE
Leukocytes,Ua: NEGATIVE
Nitrite: NEGATIVE
RBC / HPF: NONE SEEN (ref 0–?)
Specific Gravity, Urine: 1.015 (ref 1.000–1.030)
Total Protein, Urine: NEGATIVE
Urine Glucose: NEGATIVE
Urobilinogen, UA: 0.2 (ref 0.0–1.0)
WBC, UA: NONE SEEN (ref 0–?)
pH: 6 (ref 5.0–8.0)

## 2019-08-20 LAB — LIPID PANEL
Cholesterol: 183 mg/dL (ref 0–200)
HDL: 41.5 mg/dL (ref >39.00)
NonHDL: 141.41
Total CHOL/HDL Ratio: 4
Triglycerides: 214 mg/dL — ABNORMAL HIGH (ref 0.0–149.0)
VLDL: 42.8 mg/dL — ABNORMAL HIGH (ref 0.0–40.0)

## 2019-08-20 LAB — HEMOGLOBIN A1C: Hgb A1c MFr Bld: 6 % (ref 4.6–6.5)

## 2019-08-22 LAB — URINE CULTURE
MICRO NUMBER:: 10106686
SPECIMEN QUALITY:: ADEQUATE

## 2019-09-10 ENCOUNTER — Telehealth: Payer: Self-pay

## 2019-09-10 NOTE — Telephone Encounter (Signed)
Spoke with pt and she stated that she did request the form to be filled out by Dr. Derrel Nip. She stated that she is going before a judge on Friday and will need this form completed by then. I have placed form in red folder.

## 2019-09-10 NOTE — Telephone Encounter (Signed)
LMTCB. Need to find out if pt requested [employer] to fax over a form requesting Dr. Lupita Dawn medical opinion on her chronic back pain.

## 2019-09-11 NOTE — Telephone Encounter (Signed)
Forms have been completed and faxed. Pt has been notified that they were faxed.

## 2019-10-07 ENCOUNTER — Other Ambulatory Visit: Payer: Self-pay | Admitting: Internal Medicine

## 2019-11-15 ENCOUNTER — Encounter: Payer: Self-pay | Admitting: Internal Medicine

## 2019-11-15 ENCOUNTER — Other Ambulatory Visit: Payer: Self-pay

## 2019-11-15 ENCOUNTER — Ambulatory Visit (INDEPENDENT_AMBULATORY_CARE_PROVIDER_SITE_OTHER): Payer: No Typology Code available for payment source | Admitting: Internal Medicine

## 2019-11-15 VITALS — BP 130/88 | HR 75 | Temp 96.4°F | Resp 16 | Ht 70.0 in | Wt 284.2 lb

## 2019-11-15 DIAGNOSIS — Z720 Tobacco use: Secondary | ICD-10-CM | POA: Diagnosis not present

## 2019-11-15 DIAGNOSIS — M5136 Other intervertebral disc degeneration, lumbar region: Secondary | ICD-10-CM

## 2019-11-15 DIAGNOSIS — E1169 Type 2 diabetes mellitus with other specified complication: Secondary | ICD-10-CM | POA: Diagnosis not present

## 2019-11-15 DIAGNOSIS — E669 Obesity, unspecified: Secondary | ICD-10-CM | POA: Diagnosis not present

## 2019-11-15 DIAGNOSIS — Z23 Encounter for immunization: Secondary | ICD-10-CM | POA: Diagnosis not present

## 2019-11-15 MED ORDER — VARENICLINE TARTRATE 1 MG PO TABS: 1.0000 mg | ORAL_TABLET | Freq: Two times a day (BID) | ORAL | 3 refills | Status: DC

## 2019-11-15 MED ORDER — OXYCODONE HCL 5 MG PO TABS: ORAL_TABLET | ORAL | 0 refills | Status: DC

## 2019-11-15 MED ORDER — TRAMADOL HCL 50 MG PO TABS: ORAL_TABLET | ORAL | 3 refills | Status: DC

## 2019-11-15 MED ORDER — CHANTIX STARTING MONTH PAK 0.5 MG X 11 & 1 MG X 42 PO TABS: ORAL_TABLET | ORAL | 0 refills | Status: DC

## 2019-11-15 NOTE — Progress Notes (Signed)
Subjective:  Patient ID: Lisa Valencia, female    DOB: [birth year]  Age: 59 y.o. MRN: [id number]  CC: The primary encounter diagnosis was Need for tetanus booster. Diagnoses of DDD (degenerative disc disease), lumbar, Diabetes mellitus type 2 in obese [facility]), and Tobacco abuse were also pertinent to this visit.  HPI VALRI STEIDLEY presents for 1) medication refill  2) pain  management  3) tobacco cessation   This visit occurred during the SARS-CoV-2 public health emergency.  Safety protocols were in place, including screening questions prior to the visit, additional usage of staff PPE, and extensive cleaning of exam room while observing appropriate contact time as indicated for disinfecting solutions.    Patient has received the [place] 19 vaccine without complications.  Patient continues to mask when outside of the home except when walking in yard or at safe distances from others .  Patient denies any change in mood or development of unhealthy behaviors resuting from the pandemic's restriction of activities and socialization.    Tobacco abuse  :  She has been gradually reducing her daily use of cigarettes and is wanting  Repeat a trial of Chantix .  During her previous trial she quit  for 5-6 months.   Back pain:  Has been awarded disability recently.  Ironically,  Her pain transiently improved for 10 days after receiving notification of disability status,  During that time didn't use any meds,  Then for the last 2 weeks HAS RETURNED  with radiculopathy involving the  left leg laterally.   THE PAIN IS Aggravated by yard work. Using tramadol  Twice daily for moderate pain and  oxycodone  Tid for severe pain .   Outpatient Medications Prior to Visit  Medication Sig Dispense Refill  . aspirin EC 81 MG tablet Take 81 mg by mouth daily.    . celecoxib (CELEBREX) 200 MG capsule TAKE 1 CAPSULE BY MOUTH TWICE DAILY 60 capsule 3  . cetirizine (ZYRTEC) 10 MG tablet Take 10 mg by mouth  daily.     Marland Kitchen conjugated estrogens (PREMARIN) vaginal cream Place one applicatorful vaginally two to three times weekly 42.5 g 12  . DULoxetine (CYMBALTA) 20 MG capsule TAKE 1 CAPSULE BY MOUTH ONCE DAILY AFTER SUPPER 90 capsule 1  . methocarbamol (ROBAXIN) 500 MG tablet Take 1 tablet (500 mg total) by mouth 4 (four) times daily. 90 tablet 0  . Omega-3 Fatty Acids (FISH OIL) 1000 MG CAPS Take 1,000 mg by mouth daily.     Marland Kitchen omeprazole (PRILOSEC) 20 MG capsule TAKE 1 CAPSULE BY MOUTH TWICE DAILY BEFORE A MEAL 60 capsule 3  . simvastatin (ZOCOR) 20 MG tablet TAKE ONE TABLET AT BEDTIME 90 tablet 3  . spironolactone (ALDACTONE) 50 MG tablet Take 1 tablet (50 mg total) by mouth daily. 90 tablet 1  . oxyCODONE (OXY IR/ROXICODONE) 5 MG immediate release tablet TAKE 1 TABLET BY MOUTH 3 TIMES DAILY AS NEEDED FOR SEVERE PAIN 90 tablet 0  . oxyCODONE (OXY IR/ROXICODONE) 5 MG immediate release tablet TAKE 1 TABLET BY MOUTH 3 TIMES DAILY AS NEEDED FOR SEVERE PAIN 90 tablet 0  . oxyCODONE (OXY IR/ROXICODONE) 5 MG immediate release tablet TAKE 1 TABLET BY MOUTH 3 TIMES DAILY AS NEEDED FOR SEVERE PAIN 90 tablet 0  . traMADol (ULTRAM) 50 MG tablet TAKE 1-2 TABLTS BY MOUTH EVERY 6 HOURS AS NEEDED FOR SEVERE BACK PAIN MAX OF 6/DAY 180 tablet 3   No facility-administered medications prior to  visit.    Review of Systems;  Patient denies headache, fevers, malaise, unintentional weight loss, skin rash, eye pain, sinus congestion and sinus pain, sore throat, dysphagia,  hemoptysis , cough, dyspnea, wheezing, chest pain, palpitations, orthopnea, edema, abdominal pain, nausea, melena, diarrhea, constipation, flank pain, dysuria, hematuria, urinary  Frequency, nocturia, numbness, tingling, seizures,  Focal weakness, Loss of consciousness,  Tremor, insomnia, depression, anxiety, and suicidal ideation.      Objective:  BP 130/88 (BP Location: Left Arm, Patient Position: Sitting, Cuff Size: Large)   Pulse 75   Temp (!) 96.4  F (35.8 C) (Temporal)   Resp 16   Ht 5\' 10"  (1.778 m)   Wt 284 lb 3.2 oz (128.9 kg)   SpO2 96%   BMI 40.78 kg/m   BP Readings from Last 3 Encounters:  11/15/19 130/88  09/03/18 128/76  07/23/18 120/86    Wt Readings from Last 3 Encounters:  11/15/19 284 lb 3.2 oz (128.9 kg)  08/19/19 290 lb (131.5 kg)  09/03/18 289 lb 3.2 oz (131.2 kg)    General appearance: alert, cooperative and appears stated age Ears: normal TM's and external ear canals both ears Throat: lips, mucosa, and tongue normal; teeth and gums normal Neck: no adenopathy, no carotid bruit, supple, symmetrical, trachea midline and thyroid not enlarged, symmetric, no tenderness/mass/nodules Back: symmetric, no curvature. ROM normal. No CVA tenderness. Lungs: clear to auscultation bilaterally Heart: regular rate and rhythm, S1, S2 normal, no murmur, click, rub or gallop Abdomen: soft, non-tender; bowel sounds normal; no masses,  no organomegaly Pulses: 2+ and symmetric Skin: Skin color, texture, turgor normal. No rashes or lesions Lymph nodes: Cervical, supraclavicular, and axillary nodes normal.  Lab Results  Component Value Date   HGBA1C 6.0 08/20/2019   HGBA1C 6.1 02/12/2019   HGBA1C 5.5 07/03/2018    Lab Results  Component Value Date   CREATININE 0.82 08/20/2019   CREATININE 0.82 02/12/2019   CREATININE 0.83 07/23/2018    Lab Results  Component Value Date   WBC 4.6 07/03/2018   HGB 13.8 07/03/2018   HCT 41.9 07/03/2018   PLT 198.0 07/03/2018   GLUCOSE 106 (H) 08/20/2019   CHOL 183 08/20/2019   TRIG 214.0 (H) 08/20/2019   HDL 41.50 08/20/2019   LDLDIRECT 115.0 08/20/2019   LDLCALC 160 (H) 05/17/2012   ALT 55 (H) 08/20/2019   AST 33 08/20/2019   NA 138 08/20/2019   K 4.6 08/20/2019   CL 100 08/20/2019   CREATININE 0.82 08/20/2019   BUN 14 08/20/2019   CO2 31 08/20/2019   TSH 0.76 02/01/2016   INR 0.90 04/19/2018   HGBA1C 6.0 08/20/2019   MICROALBUR 1.6 08/20/2019    MM 3D SCREEN  BREAST BILATERAL  Result Date: 09/12/2018 CLINICAL DATA:  Screening. EXAM: DIGITAL SCREENING BILATERAL MAMMOGRAM WITH TOMO AND CAD COMPARISON:  Previous exam(s). ACR Breast Density Category a: The breast tissue is almost entirely fatty. FINDINGS: There are no findings suspicious for malignancy. Images were processed with CAD. IMPRESSION: No mammographic evidence of malignancy. A result letter of this screening mammogram will be mailed directly to the patient. RECOMMENDATION: Screening mammogram in one year. (Code:SM-B-01Y) BI-RADS CATEGORY  1: Negative. Electronically Signed   By: Kristopher Oppenheim M.D.   On: 09/12/2018 16:36    Assessment & Plan:   Problem List Items Addressed This Visit      Unprioritized   DDD (degenerative disc disease), lumbar    Chronic since 2016.  Persistent despite conservative treatment.  MRI notes L4  nerve root abutting a bulging disk , but Neurosurgery has not offered definitive treatment. 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  Weight loss achieved with [employer],  Continued loss strongly advised, continue tramadol regularly for daytime pain at a rate of  10 mg of oxycodone thrice  daily for nocturnal pain. Continue cymbalta for management of depressive symptoms brought on by chronic pain  And reduced doseof  Citalopram   at 20 mg daily.  Narcotics Refill history confirmed via [place] Controlled Substance databas, accessed by me today..      Relevant Medications   oxyCODONE (OXY IR/ROXICODONE) 5 MG immediate release tablet   oxyCODONE (OXY IR/ROXICODONE) 5 MG immediate release tablet (Start on 12/15/2019)   oxyCODONE (OXY IR/ROXICODONE) 5 MG immediate release tablet (Start on 01/15/2020)   traMADol (ULTRAM) 50 MG tablet   Diabetes mellitus type 2 in obese ([place])    Treated with diet given recurrent hypoglycemic symptoms with metformin lowest dose. a1c due  in August   Lab Results  Component Value Date   HGBA1C 6.0 08/20/2019         Tobacco abuse    Resuming chantix .  Advised to stay on it for at least 3 months to improve changes of success        Other Visit Diagnoses    Need for tetanus booster    -  Primary   Relevant Orders   Td : Tetanus/diphtheria >7yo Preservative  free (Completed)      I am having Blakelynn A. Kamaka start on Chantix Starting [employer] and varenicline. I am also having her maintain her Fish Oil, conjugated estrogens, cetirizine, aspirin EC, spironolactone, simvastatin, DULoxetine, methocarbamol, omeprazole, celecoxib, oxyCODONE, oxyCODONE, oxyCODONE, and traMADol.  Meds ordered this encounter  Medications  . varenicline (CHANTIX STARTING MONTH PAK) 0.5 MG X 11 & 1 MG X 42 tablet    Sig: Take one 0.5 mg tablet by mouth once daily for 3 days, then increase to one 0.5 mg tablet twice daily for 4 days, then increase to one 1 mg tablet twice daily.    Dispense:  53 tablet    Refill:  0  . varenicline (CHANTIX CONTINUING MONTH PAK) 1 MG tablet    Sig: Take 1 tablet (1 mg total) by mouth 2 (two) times daily.    Dispense:  60 tablet    Refill:  3  . oxyCODONE (OXY IR/ROXICODONE) 5 MG immediate release tablet    Sig: TAKE 1 TABLET BY MOUTH 3 TIMES DAILY AS NEEDED FOR SEVERE PAIN    Dispense:  90 tablet    Refill:  0  . oxyCODONE (OXY IR/ROXICODONE) 5 MG immediate release tablet    Sig: TAKE 1 TABLET BY MOUTH 3 TIMES DAILY AS NEEDED FOR SEVERE PAIN    Dispense:  90 tablet    Refill:  0  . oxyCODONE (OXY IR/ROXICODONE) 5 MG immediate release tablet    Sig: TAKE 1 TABLET BY MOUTH 3 TIMES DAILY AS NEEDED FOR SEVERE PAIN    Dispense:  90 tablet    Refill:  0  . traMADol (ULTRAM) 50 MG tablet    Sig: TAKE 1-2 TABLTS BY MOUTH EVERY 6 HOURS AS NEEDED FOR SEVERE BACK PAIN MAX OF 6/DAY    Dispense:  180 tablet    Refill:  3    Medications Discontinued During This Encounter  Medication  Reason  . oxyCODONE (OXY  IR/ROXICODONE) 5 MG immediate release tablet Reorder  . oxyCODONE (OXY IR/ROXICODONE) 5 MG immediate release tablet Reorder  . oxyCODONE (OXY IR/ROXICODONE) 5 MG immediate release tablet Reorder  . traMADol (ULTRAM) 50 MG tablet Reorder    Follow-up: Return in about 3 months (around 02/14/2020).   Crecencio Mc, MD

## 2019-11-15 NOTE — Patient Instructions (Signed)
I have refilled the oxycodone and tramadol for 3 months  chantix starting pack and continuiing pack for 3-4 months  No labs due  Tetanus booster today,  Good for ten years

## 2019-11-16 NOTE — Assessment & Plan Note (Signed)
Chronic since 2016.  Persistent despite conservative treatment.  MRI notes L4 nerve root abutting a bulging disk , but Neurosurgery has not offered definitive treatment. 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  Weight loss achieved with [employer],  Continued loss strongly advised, continue tramadol regularly for daytime pain at a rate of  10 mg of oxycodone thrice  daily for nocturnal pain. Continue cymbalta for management of depressive symptoms brought on by chronic pain  And reduced doseof  Citalopram   at 20 mg daily.  Narcotics Refill history confirmed via [place] Controlled Substance databas, accessed by me today.Marland Kitchen

## 2019-11-16 NOTE — Assessment & Plan Note (Addendum)
Treated with diet given recurrent hypoglycemic symptoms with metformin lowest dose. a1c due in August   Lab Results  Component Value Date   HGBA1C 6.0 08/20/2019

## 2019-11-16 NOTE — Assessment & Plan Note (Addendum)
Resuming chantix .  Advised to stay on it for at least 3 months to improve changes of success

## 2019-11-25 ENCOUNTER — Other Ambulatory Visit: Payer: Self-pay | Admitting: Internal Medicine

## 2019-12-12 ENCOUNTER — Other Ambulatory Visit: Payer: Self-pay | Admitting: Internal Medicine

## 2020-01-07 ENCOUNTER — Other Ambulatory Visit: Payer: Self-pay | Admitting: Internal Medicine

## 2020-01-13 ENCOUNTER — Encounter: Payer: Self-pay | Admitting: Internal Medicine

## 2020-02-12 ENCOUNTER — Telehealth (INDEPENDENT_AMBULATORY_CARE_PROVIDER_SITE_OTHER): Payer: Self-pay | Admitting: Internal Medicine

## 2020-02-12 ENCOUNTER — Encounter: Payer: Self-pay | Admitting: Internal Medicine

## 2020-02-12 VITALS — Ht 70.0 in | Wt 284.0 lb

## 2020-02-12 DIAGNOSIS — M5431 Sciatica, right side: Secondary | ICD-10-CM

## 2020-02-12 DIAGNOSIS — Z0000 Encounter for general adult medical examination without abnormal findings: Secondary | ICD-10-CM

## 2020-02-12 DIAGNOSIS — Z79899 Other long term (current) drug therapy: Secondary | ICD-10-CM

## 2020-02-12 DIAGNOSIS — Z720 Tobacco use: Secondary | ICD-10-CM

## 2020-02-12 DIAGNOSIS — R7303 Prediabetes: Secondary | ICD-10-CM

## 2020-02-12 MED ORDER — OXYCODONE HCL 5 MG PO TABS: ORAL_TABLET | ORAL | 0 refills | Status: DC

## 2020-02-12 NOTE — Patient Instructions (Addendum)
NONFASTING LABS needed in AUGUST  MEDS REFILLED FOR 3 MONTHS  GO LO AND OPTAVIA ARE BOTH EXCELLENT ALTERNATIVES TO WEIGHT WATCHERS   We will postpone your PAP smear until next January    Exercise: (supported back extension, standing)  Stand against a counter or sofa with your buttocks resting on the edge and your hands on the edge as well on either side of your back  Slowly lean backwards,  Bending from the waist, until you feel slight discomfort. Restore yourself to vertical position (up straight) Repeat the back extension 10 times , each time extending a little farther).  What should you expect? The pain should recede from the calf/thigh/buttocks but may localize to the lower back  If it does not,  Or if it makes the leg pain worse, STOP doing it.   If it results in improvement,  Repeat the exercise every 2-3 hours while awake and STOP THE OTHER EXERCISES that do the opposite motion (back flexion)

## 2020-02-12 NOTE — Progress Notes (Signed)
Virtual Visit converted to telephone visit y  This visit type was conducted due to national recommendations for restrictions regarding the COVID-19 pandemic (e.g. social distancing).  This format is felt to be most appropriate for this patient at this time.  All issues noted in this document were discussed and addressed.  No physical exam was performed (except for noted visual exam findings with Video Visits).   I  Attempted to connect with@ on 02/12/20 at  8:30 AM EDT by a video enabled telemedicine application  and verified that I am speaking with the correct person using two identifiers. Interactive audio and video telecommunications were initially established beteen this provider and patient, however ultimately failed, due to patient having technical difficulties. We continued and completed visit with audio only  and verified that I am speaking with the correct person using two identifiers  Location patient: home Location provider: work or home office Persons participating in the virtual visit: patient, provider  I discussed the limitations, risks, security and privacy concerns of performing an evaluation and management service by telephone and the availability of in person appointments. I also discussed with the patient that there may be a patient responsible charge related to this service. The patient expressed understanding and agreed to proceed.   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  Lisa Valencia is currently Uninsured.  She has deferred overdue PAP smear until January  Tobacco abuse:  Treatment failure fue ot chantix caused nausea and vomiting,  Also had some weight gain .  Stopped the medication, and lost the weight (2-3 lbs).  Still smoking ,  About 6-7 daily.  Doing Weight Watchers for management of morbid obesity   Back pain has been aggravated by yard work ,  Using her  cane a lot. pain also aggravated by sitting     Refill history confirmed via [place] Controlled Substance databas, accessed by me today..   ROS: Patient denies headache, fevers, malaise, unintentional weight loss, skin rash, eye pain, sinus congestion and sinus pain, sore throat, dysphagia,  hemoptysis , cough, dyspnea, wheezing, chest pain, palpitations, orthopnea, edema, abdominal pain, nausea, melena, diarrhea, constipation, flank pain, dysuria, hematuria, urinary  Frequency, nocturia, numbness, tingling, seizures,  Focal weakness, Loss of consciousness,  Tremor, insomnia, depression, anxiety, and suicidal ideation.      Past Medical History:  Diagnosis Date  . Anxiety   . Arthritis   . Depression   . Diverticulosis of intestine 2010   iftikhar  . Esophagitis, reflux 2010    EGD by iftkihar, managed wiht dexilant  . GERD (gastroesophageal reflux disease)   . Impaired fasting glucose 01/25/2012   Needs eye exam.      Past Surgical History:  Procedure Laterality Date  . ABDOMINAL HYSTERECTOMY  2009   supracervical   . APPENDECTOMY    . BREAST CYST ASPIRATION Left    lt fna-neg  . COLONOSCOPY WITH PROPOFOL N/A 06/05/2015   Procedure: COLONOSCOPY WITH PROPOFOL;  Surgeon: Lucilla Lame, MD;  Location: [place];  Service: Endoscopy;  Laterality: N/A;  Pt requests early. Pt for antibiotic pre-procedure  CECAL PROTRUSION BX. / WITH CLIP LOT # [id number] RESOLUTION 360 EXP 03/03/2016 SIGMOID POLYP  . JOINT REPLACEMENT Right 2012   hip, [name]  . TOTAL HIP ARTHROPLASTY Left 05/02/2018   Procedure: TOTAL HIP ARTHROPLASTY;  Surgeon: Dereck Leep, MD;  Location:  [facility];  Service: Orthopedics;  Laterality: Left;    Family History  Problem Relation Age of Onset  . Cancer Father        lung ca  . Hyperlipidemia Father        PAD  . Diabetes Sister   . Cancer Paternal Aunt        breast  . Breast cancer Paternal Aunt 39  . Diabetes Maternal Grandfather   . Cancer  Maternal Grandfather        colon ca  . Cancer Paternal Grandmother        colon  . Cancer Paternal Grandfather        colon ca  . Heart disease Neg Hx     SOCIAL HX:  reports that she has been smoking cigarettes. She has a 35.00 pack-year smoking history. She has never used smokeless tobacco. She reports previous alcohol use. She reports that she does not use drugs.   Current Outpatient Medications:  .  aspirin EC 81 MG tablet, Take 81 mg by mouth daily., Disp: , Rfl:  .  celecoxib (CELEBREX) 200 MG capsule, TAKE 1 CAPSULE BY MOUTH TWICE DAILY, Disp: 60 capsule, Rfl: 3 .  cetirizine (ZYRTEC) 10 MG tablet, Take 10 mg by mouth daily. , Disp: , Rfl:  .  conjugated estrogens (PREMARIN) vaginal cream, Place one applicatorful vaginally two to three times weekly, Disp: 42.5 g, Rfl: 12 .  DULoxetine (CYMBALTA) 20 MG capsule, TAKE 1 CAPSULE BY MOUTH ONCE DAILY AFTER SUPPER, Disp: 90 capsule, Rfl: 1 .  methocarbamol (ROBAXIN) 500 MG tablet, Take 1 tablet (500 mg total) by mouth 4 (four) times daily., Disp: 90 tablet, Rfl: 0 .  Omega-3 Fatty Acids (FISH OIL) 1000 MG CAPS, Take 1,000 mg by mouth daily. , Disp: , Rfl:  .  omeprazole (PRILOSEC) 20 MG capsule, TAKE 1 CAPSULE BY MOUTH TWICE DAILY BEFORE A MEAL, Disp: 60 capsule, Rfl: 3 .  oxyCODONE (OXY IR/ROXICODONE) 5 MG immediate release tablet, TAKE 1 TABLET BY MOUTH 3 TIMES DAILY AS NEEDED FOR SEVERE PAIN, Disp: 90 tablet, Rfl: 0 .  [START ON 04/14/2020] oxyCODONE (OXY IR/ROXICODONE) 5 MG immediate release tablet, TAKE 1 TABLET BY MOUTH 3 TIMES DAILY AS NEEDED FOR SEVERE PAIN, Disp: 90 tablet, Rfl: 0 .  [START ON 03/15/2020] oxyCODONE (OXY IR/ROXICODONE) 5 MG immediate release tablet, TAKE 1 TABLET BY MOUTH 3 TIMES DAILY AS NEEDED FOR SEVERE PAIN, Disp: 90 tablet, Rfl: 0 .  simvastatin (ZOCOR) 20 MG tablet, TAKE ONE TABLET AT BEDTIME, Disp: 90 tablet, Rfl: 3 .  spironolactone (ALDACTONE) 50 MG tablet, TAKE ONE TABLET BY MOUTH EVERY DAY, Disp: 90 tablet,  Rfl: 1 .  traMADol (ULTRAM) 50 MG tablet, TAKE 1-2 TABLTS BY MOUTH EVERY 6 HOURS AS NEEDED FOR SEVERE BACK PAIN MAX OF 6/DAY, Disp: 180 tablet, Rfl: 3  EXAM:   General impression: alert, cooperative and articulate.  No signs of being in distress  Lungs: speech is fluent sentence length suggests that patient is not short of breath and not punctuated by cough, sneezing or sniffing. Marland Kitchen   Psych: affect normal.  speech is articulate and non pressured .  Denies symptoms of depression    ASSESSMENT AND PLAN:  Discussed the following assessment and plan:  Long-term use of high-risk medication - Plan: Comprehensive metabolic panel  Tobacco abuse  Sciatica of right side  Routine general medical examination at a health care facility  Prediabetes  Morbid obesity ([place])  Tobacco abuse Did not  tolerate Chantix due to persistent nausea and weight gain . Prior trial of wellbutrin not tolerated either . encouarged to weekly taper  use by one reducing one cig per day     Sciatica Persistent despite conservative treatment.  MRI notes L4 nerve root abutting a bulging disk , but Neurosurgery has not offered definitive treatment. 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  She is now unemployed and uninsured.  Continued attempts at  Weight loss strongly advised,  NSAIDs trial not helpful  continue tramadol regularly and 10 mg of oxycodone daily for nocturnal pain. Continue cymbalta for management of depressive symptoms brought on by chronic pain  And reduced doseof  Citalopram   at 20 mg daily.  Narcotics Refill history confirmed via [place] Controlled Substance databas, accessed by me today..  Routine general medical examination at a health care facility She is overdue for PAP and mammogram  And has deferred until Jan 2022.   Prediabetes Treated with diet given history  recurrent hypoglycemic  symptoms with metformin even at the lowest dose Lab Results  Component Value Date   HGBA1C 6.0 08/20/2019     Morbid obesity ([place]) Using Weight watchers program. u encouraged  Continued weight loss with goal of 10% of body weigh over the next 6 months using a low glycemic index diet and regular exercise a minimum of 5 days per week.      I discussed the assessment and treatment plan with the patient. The patient was provided an opportunity to ask questions and all were answered. The patient agreed with the plan and demonstrated an understanding of the instructions.   The patient was advised to call back or seek an in-person evaluation if the symptoms worsen or if the condition fails to improve as anticipated.  I provided 24 minutes of non-face-to-face time during this encounter.   Crecencio Mc, MD

## 2020-02-14 ENCOUNTER — Ambulatory Visit: Payer: 59 | Admitting: Internal Medicine

## 2020-02-14 NOTE — Assessment & Plan Note (Addendum)
Treated with diet given history  recurrent hypoglycemic symptoms with metformin even at the lowest dose Lab Results  Component Value Date   HGBA1C 6.0 08/20/2019

## 2020-02-14 NOTE — Assessment & Plan Note (Signed)
Using Weight watchers program. u encouraged  Continued weight loss with goal of 10% of body weigh over the next 6 months using a low glycemic index diet and regular exercise a minimum of 5 days per week.

## 2020-02-14 NOTE — Assessment & Plan Note (Signed)
Did not tolerate Chantix due to persistent nausea and weight gain . Prior trial of wellbutrin not tolerated either . encouarged to weekly taper  use by one reducing one cig per day

## 2020-02-14 NOTE — Assessment & Plan Note (Signed)
Persistent despite conservative treatment.  MRI notes L4 nerve root abutting a bulging disk , but Neurosurgery has not offered definitive treatment. 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  She is now unemployed and uninsured.  Continued attempts at  Weight loss strongly advised,  NSAIDs trial not helpful  continue tramadol regularly and 10 mg of oxycodone daily for nocturnal pain. Continue cymbalta for management of depressive symptoms brought on by chronic pain  And reduced doseof  Citalopram   at 20 mg daily.  Narcotics Refill history confirmed via [place] Controlled Substance databas, accessed by me today.Marland Kitchen

## 2020-02-14 NOTE — Assessment & Plan Note (Signed)
She is overdue for PAP and mammogram  And has deferred until Jan 2022.

## 2020-02-19 ENCOUNTER — Other Ambulatory Visit: Payer: Self-pay

## 2020-02-24 ENCOUNTER — Other Ambulatory Visit (INDEPENDENT_AMBULATORY_CARE_PROVIDER_SITE_OTHER): Payer: Self-pay

## 2020-02-24 ENCOUNTER — Other Ambulatory Visit: Payer: Self-pay

## 2020-02-24 DIAGNOSIS — Z79899 Other long term (current) drug therapy: Secondary | ICD-10-CM

## 2020-02-24 LAB — COMPREHENSIVE METABOLIC PANEL
ALT: 29 U/L (ref 0–35)
AST: 21 U/L (ref 0–37)
Albumin: 4.7 g/dL (ref 3.5–5.2)
Alkaline Phosphatase: 59 U/L (ref 39–117)
BUN: 12 mg/dL (ref 6–23)
CO2: 28 mEq/L (ref 19–32)
Calcium: 10.3 mg/dL (ref 8.4–10.5)
Chloride: 100 mEq/L (ref 96–112)
Creatinine, Ser: 0.86 mg/dL (ref 0.40–1.20)
GFR: 67.51 mL/min (ref >60.00)
Glucose, Bld: 104 mg/dL — ABNORMAL HIGH (ref 70–99)
Potassium: 4.9 mEq/L (ref 3.5–5.1)
Sodium: 140 mEq/L (ref 135–145)
Total Bilirubin: 0.4 mg/dL (ref 0.2–1.2)
Total Protein: 7.8 g/dL (ref 6.0–8.3)

## 2020-04-14 ENCOUNTER — Telehealth: Payer: Self-pay

## 2020-04-14 NOTE — Telephone Encounter (Signed)
Prior Authorization for Oxycodone has been submitted to Cover My Meds.

## 2020-04-14 NOTE — Telephone Encounter (Signed)
Prior Authorization for Oxycodone has been approved. Patient has been notified.

## 2020-05-11 ENCOUNTER — Ambulatory Visit (INDEPENDENT_AMBULATORY_CARE_PROVIDER_SITE_OTHER): Payer: 59 | Admitting: Internal Medicine

## 2020-05-11 ENCOUNTER — Other Ambulatory Visit: Payer: Self-pay

## 2020-05-11 ENCOUNTER — Encounter: Payer: Self-pay | Admitting: Internal Medicine

## 2020-05-11 ENCOUNTER — Other Ambulatory Visit: Payer: Self-pay | Admitting: Internal Medicine

## 2020-05-11 VITALS — BP 136/82 | HR 95 | Temp 98.1°F | Resp 15 | Ht 70.0 in | Wt 287.2 lb

## 2020-05-11 DIAGNOSIS — K760 Fatty (change of) liver, not elsewhere classified: Secondary | ICD-10-CM

## 2020-05-11 DIAGNOSIS — R7303 Prediabetes: Secondary | ICD-10-CM | POA: Diagnosis not present

## 2020-05-11 DIAGNOSIS — E782 Mixed hyperlipidemia: Secondary | ICD-10-CM

## 2020-05-11 DIAGNOSIS — Z720 Tobacco use: Secondary | ICD-10-CM

## 2020-05-11 DIAGNOSIS — M51369 Other intervertebral disc degeneration, lumbar region without mention of lumbar back pain or lower extremity pain: Secondary | ICD-10-CM

## 2020-05-11 DIAGNOSIS — M5136 Other intervertebral disc degeneration, lumbar region: Secondary | ICD-10-CM

## 2020-05-11 NOTE — Patient Instructions (Addendum)
I want you to lose  15  Lbs by your next visit   return in 3 months for a CPE with PAP.  Labs one day prior   Your oxycodone was refilled on sept 23 per [facility]  Check BP with hydroxycut.  Should be 130/80 or less

## 2020-05-11 NOTE — Assessment & Plan Note (Signed)
strongly advised her to lose weight using adherence to diet and regular exercise.

## 2020-05-11 NOTE — Progress Notes (Signed)
Subjective:  Patient ID: Lisa Valencia, female    DOB: 1961/03/20  Age: 59 y.o. MRN: [id number]  CC: The primary encounter diagnosis was Prediabetes. Diagnoses of Hepatic steatosis, Mixed hyperlipidemia, Morbid obesity ([place]), Tobacco abuse, and DDD (degenerative disc disease), lumbar were also pertinent to this visit.  HPI Lisa Valencia presents for follow up on chronic back and hip pain managed with opioids and NSAIDS  This visit occurred during the SARS-CoV-2 public health emergency.  Safety protocols were in place, including screening questions prior to the visit, additional usage of staff PPE, and extensive cleaning of exam room while observing appropriate contact time as indicated for disinfecting solutions.   Patient states that her back pain has been well controlled until two weeks ago when she strained it working in her garden.  Sh eis taking celebrex twice daily and oxycodone three times daily..  She has not lost any weight . She states that she has not been exercising due to the pain  Outpatient Medications Prior to Visit  Medication Sig Dispense Refill  . aspirin EC 81 MG tablet Take 81 mg by mouth daily.    . celecoxib (CELEBREX) 200 MG capsule TAKE 1 CAPSULE BY MOUTH TWICE DAILY 60 capsule 3  . cetirizine (ZYRTEC) 10 MG tablet Take 10 mg by mouth daily.     Marland Kitchen conjugated estrogens (PREMARIN) vaginal cream Place one applicatorful vaginally two to three times weekly 42.5 g 12  . DULoxetine (CYMBALTA) 20 MG capsule TAKE 1 CAPSULE BY MOUTH ONCE DAILY AFTER SUPPER 90 capsule 1  . methocarbamol (ROBAXIN) 500 MG tablet Take 1 tablet (500 mg total) by mouth 4 (four) times daily. 90 tablet 0  . Omega-3 Fatty Acids (FISH OIL) 1000 MG CAPS Take 1,000 mg by mouth daily.     Marland Kitchen omeprazole (PRILOSEC) 20 MG capsule TAKE 1 CAPSULE BY MOUTH TWICE DAILY BEFORE A MEAL 60 capsule 3  . oxyCODONE (OXY IR/ROXICODONE) 5 MG immediate release tablet TAKE 1 TABLET BY MOUTH 3 TIMES DAILY AS NEEDED FOR  SEVERE PAIN 90 tablet 0  . oxyCODONE (OXY IR/ROXICODONE) 5 MG immediate release tablet TAKE 1 TABLET BY MOUTH 3 TIMES DAILY AS NEEDED FOR SEVERE PAIN 90 tablet 0  . oxyCODONE (OXY IR/ROXICODONE) 5 MG immediate release tablet TAKE 1 TABLET BY MOUTH 3 TIMES DAILY AS NEEDED FOR SEVERE PAIN 90 tablet 0  . simvastatin (ZOCOR) 20 MG tablet TAKE ONE TABLET AT BEDTIME 90 tablet 3  . spironolactone (ALDACTONE) 50 MG tablet TAKE ONE TABLET BY MOUTH EVERY DAY 90 tablet 1  . traMADol (ULTRAM) 50 MG tablet TAKE 1-2 TABLTS BY MOUTH EVERY 6 HOURS AS NEEDED FOR SEVERE BACK PAIN MAX OF 6/DAY 180 tablet 3   No facility-administered medications prior to visit.    Review of Systems;  Patient denies headache, fevers, malaise, unintentional weight loss, skin rash, eye pain, sinus congestion and sinus pain, sore throat, dysphagia,  hemoptysis , cough, dyspnea, wheezing, chest pain, palpitations, orthopnea, edema, abdominal pain, nausea, melena, diarrhea, constipation, flank pain, dysuria, hematuria, urinary  Frequency, nocturia, numbness, tingling, seizures,  Focal weakness, Loss of consciousness,  Tremor, insomnia, depression, anxiety, and suicidal ideation.      Objective:  BP 136/82 (BP Location: Left Arm, Patient Position: Sitting, Cuff Size: Large)   Pulse 95   Temp 98.1 F (36.7 C) (Oral)   Resp 15   Ht 5\' 10"  (1.778 m)   Wt 287 lb 3.2 oz (130.3 kg)   SpO2 94%  BMI 41.21 kg/m   BP Readings from Last 3 Encounters:  05/11/20 136/82  11/15/19 130/88  09/03/18 128/76    Wt Readings from Last 3 Encounters:  05/11/20 287 lb 3.2 oz (130.3 kg)  02/12/20 (!) 284 lb (128.8 kg)  11/15/19 284 lb 3.2 oz (128.9 kg)    General appearance: alert, cooperative and appears stated age Ears: normal TM's and external ear canals both ears Throat: lips, mucosa, and tongue normal; teeth and gums normal Neck: no adenopathy, no carotid bruit, supple, symmetrical, trachea midline and thyroid not enlarged, symmetric,  no tenderness/mass/nodules Back: symmetric, no curvature. ROM normal. No CVA tenderness. Lungs: clear to auscultation bilaterally Heart: regular rate and rhythm, S1, S2 normal, no murmur, click, rub or gallop Abdomen: soft, non-tender; bowel sounds normal; no masses,  no organomegaly Pulses: 2+ and symmetric Skin: Skin color, texture, turgor normal. No rashes or lesions Lymph nodes: Cervical, supraclavicular, and axillary nodes normal.  Lab Results  Component Value Date   HGBA1C 6.0 08/20/2019   HGBA1C 6.1 02/12/2019   HGBA1C 5.5 07/03/2018    Lab Results  Component Value Date   CREATININE 0.86 02/24/2020   CREATININE 0.82 08/20/2019   CREATININE 0.82 02/12/2019    Lab Results  Component Value Date   WBC 4.6 07/03/2018   HGB 13.8 07/03/2018   HCT 41.9 07/03/2018   PLT 198.0 07/03/2018   GLUCOSE 104 (H) 02/24/2020   CHOL 183 08/20/2019   TRIG 214.0 (H) 08/20/2019   HDL 41.50 08/20/2019   LDLDIRECT 115.0 08/20/2019   LDLCALC 160 (H) 05/17/2012   ALT 29 02/24/2020   AST 21 02/24/2020   NA 140 02/24/2020   K 4.9 02/24/2020   CL 100 02/24/2020   CREATININE 0.86 02/24/2020   BUN 12 02/24/2020   CO2 28 02/24/2020   TSH 0.76 02/01/2016   INR 0.90 04/19/2018   HGBA1C 6.0 08/20/2019   MICROALBUR 1.6 08/20/2019    MM 3D SCREEN BREAST BILATERAL  Result Date: 09/12/2018 CLINICAL DATA:  Screening. EXAM: DIGITAL SCREENING BILATERAL MAMMOGRAM WITH TOMO AND CAD COMPARISON:  Previous exam(s). ACR Breast Density Category a: The breast tissue is almost entirely fatty. FINDINGS: There are no findings suspicious for malignancy. Images were processed with CAD. IMPRESSION: No mammographic evidence of malignancy. A result letter of this screening mammogram will be mailed directly to the patient. RECOMMENDATION: Screening mammogram in one year. (Code:SM-B-01Y) BI-RADS CATEGORY  1: Negative. Electronically Signed   By: Kristopher Oppenheim M.D.   On: 09/12/2018 16:36    Assessment & Plan:    Problem List Items Addressed This Visit      Unprioritized   Tobacco abuse    Did not tolerate Chantix due to persistent nausea and weight gain . Prior trial of wellbutrin not tolerated either . encouraaged to weekly taper  use by one reducing one cig per day         Prediabetes - Primary   Relevant Orders   Hemoglobin A1c   Morbid obesity ([place])    strongly advised her to lose weight using adherence to diet and regular exercise.        Hyperlipidemia, unspecified   Relevant Orders   Lipid panel   Hepatic steatosis   Relevant Orders   Comprehensive metabolic panel   DDD (degenerative disc disease), lumbar    Strongly advised to refrain from doing "yardwork and gardening" and start exercising regularly          I am having Juliann Pulse A. Gilles maintain her  Fish Oil, conjugated estrogens, cetirizine, aspirin EC, simvastatin, methocarbamol, traMADol, spironolactone, celecoxib, DULoxetine, oxyCODONE, oxyCODONE, oxyCODONE, and omeprazole.  No orders of the defined types were placed in this encounter.   There are no discontinued medications.  Follow-up: Return in about 3 months (around 08/11/2020).   Crecencio Mc, MD

## 2020-05-11 NOTE — Assessment & Plan Note (Signed)
Did not tolerate Chantix due to persistent nausea and weight gain . Prior trial of wellbutrin not tolerated either . encouraaged to weekly taper  use by one reducing one cig per day    

## 2020-05-11 NOTE — Assessment & Plan Note (Addendum)
Persistent despite conservative treatment.  MRI notes L4 nerve root abutting a bulging disk , but Neurosurgery has not offered definitive treatment. 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  She is now unemployed and uninsured.  Continued attempts at  Weight loss strongly advised,  NSAIDs trial not helpful  continue tramadol regularly and 10 mg of oxycodone daily for nocturnal pain. Continue cymbalta for management of depressive symptoms brought on by chronic pain  And reduced doseof  Citalopram   at 20 mg daily.  Narcotics Refill history confirmed via [place] Controlled Substance databas, accessed by me today..Strongly advised to refrain from doing "yardwork and gardening" and start exercising regularly

## 2020-06-08 ENCOUNTER — Other Ambulatory Visit: Payer: Self-pay | Admitting: Internal Medicine

## 2020-06-08 NOTE — Telephone Encounter (Signed)
LS: 05-11-20 LO:04-14-20 for oxy codone and 11-15-19 for tramadol

## 2020-07-06 ENCOUNTER — Other Ambulatory Visit: Payer: Self-pay | Admitting: Internal Medicine

## 2020-07-06 NOTE — Telephone Encounter (Signed)
RX Refill:oxycodone Last Seen:05-11-20 Last ordered:06-08-20

## 2020-07-06 NOTE — Telephone Encounter (Signed)
RX Refill:tramadol Last Seen:05-11-20 Last ordered:06-08-20

## 2020-07-07 ENCOUNTER — Other Ambulatory Visit: Payer: Self-pay | Admitting: Internal Medicine

## 2020-07-07 MED ORDER — TRAMADOL HCL 50 MG PO TABS: ORAL_TABLET | ORAL | 0 refills | Status: DC

## 2020-08-06 DIAGNOSIS — Z96643 Presence of artificial hip joint, bilateral: Secondary | ICD-10-CM | POA: Diagnosis not present

## 2020-08-11 ENCOUNTER — Other Ambulatory Visit: Payer: Self-pay | Admitting: Internal Medicine

## 2020-08-11 ENCOUNTER — Other Ambulatory Visit: Payer: Self-pay

## 2020-08-11 ENCOUNTER — Other Ambulatory Visit (INDEPENDENT_AMBULATORY_CARE_PROVIDER_SITE_OTHER): Payer: Medicare Other

## 2020-08-11 DIAGNOSIS — K760 Fatty (change of) liver, not elsewhere classified: Secondary | ICD-10-CM | POA: Diagnosis not present

## 2020-08-11 DIAGNOSIS — E782 Mixed hyperlipidemia: Secondary | ICD-10-CM

## 2020-08-11 DIAGNOSIS — R7303 Prediabetes: Secondary | ICD-10-CM

## 2020-08-11 LAB — COMPREHENSIVE METABOLIC PANEL
ALT: 30 U/L (ref 0–35)
AST: 20 U/L (ref 0–37)
Albumin: 4.7 g/dL (ref 3.5–5.2)
Alkaline Phosphatase: 47 U/L (ref 39–117)
BUN: 18 mg/dL (ref 6–23)
CO2: 31 mEq/L (ref 19–32)
Calcium: 10.1 mg/dL (ref 8.4–10.5)
Chloride: 100 mEq/L (ref 96–112)
Creatinine, Ser: 0.81 mg/dL (ref 0.40–1.20)
GFR: 79.35 mL/min (ref >60.00)
Glucose, Bld: 110 mg/dL — ABNORMAL HIGH (ref 70–99)
Potassium: 4.3 mEq/L (ref 3.5–5.1)
Sodium: 139 mEq/L (ref 135–145)
Total Bilirubin: 0.5 mg/dL (ref 0.2–1.2)
Total Protein: 7.8 g/dL (ref 6.0–8.3)

## 2020-08-11 LAB — LIPID PANEL
Cholesterol: 157 mg/dL (ref 0–200)
HDL: 37.5 mg/dL — ABNORMAL LOW (ref >39.00)
NonHDL: 119.3
Total CHOL/HDL Ratio: 4
Triglycerides: 206 mg/dL — ABNORMAL HIGH (ref 0.0–149.0)
VLDL: 41.2 mg/dL — ABNORMAL HIGH (ref 0.0–40.0)

## 2020-08-11 LAB — HEMOGLOBIN A1C: Hgb A1c MFr Bld: 6.1 % (ref 4.6–6.5)

## 2020-08-11 LAB — LDL CHOLESTEROL, DIRECT: Direct LDL: 94 mg/dL

## 2020-08-11 NOTE — Telephone Encounter (Signed)
RX Refill:Tramadol Last Seen:05-11-20 Last ordered:07-07-20

## 2020-08-13 ENCOUNTER — Encounter: Payer: 59 | Admitting: Internal Medicine

## 2020-08-24 ENCOUNTER — Other Ambulatory Visit: Payer: Self-pay

## 2020-08-24 ENCOUNTER — Ambulatory Visit (INDEPENDENT_AMBULATORY_CARE_PROVIDER_SITE_OTHER): Payer: Medicare Other | Admitting: Internal Medicine

## 2020-08-24 ENCOUNTER — Encounter: Payer: Self-pay | Admitting: Internal Medicine

## 2020-08-24 ENCOUNTER — Other Ambulatory Visit (HOSPITAL_COMMUNITY)
Admission: RE | Admit: 2020-08-24 | Discharge: 2020-08-24 | Disposition: A | Discharge status: Home / Self Care | Payer: Medicare Other | Source: Ambulatory Visit | Attending: Internal Medicine | Admitting: Internal Medicine

## 2020-08-24 VITALS — BP 160/90 | HR 91 | Temp 98.1°F | Ht 70.0 in | Wt 286.8 lb

## 2020-08-24 DIAGNOSIS — Z124 Encounter for screening for malignant neoplasm of cervix: Secondary | ICD-10-CM

## 2020-08-24 DIAGNOSIS — R030 Elevated blood-pressure reading, without diagnosis of hypertension: Secondary | ICD-10-CM

## 2020-08-24 DIAGNOSIS — M5136 Other intervertebral disc degeneration, lumbar region: Secondary | ICD-10-CM

## 2020-08-24 DIAGNOSIS — Z1151 Encounter for screening for human papillomavirus (HPV): Secondary | ICD-10-CM | POA: Insufficient documentation

## 2020-08-24 DIAGNOSIS — Z1231 Encounter for screening mammogram for malignant neoplasm of breast: Secondary | ICD-10-CM

## 2020-08-24 DIAGNOSIS — Z0000 Encounter for general adult medical examination without abnormal findings: Secondary | ICD-10-CM

## 2020-08-24 NOTE — Progress Notes (Signed)
Patient ID: Lisa Valencia, female    DOB: 08-02-1960  Age: 60 y.o. MRN: [id number]   The patient is here for the Welcome to  Medicare  preventive visit     has a past medical history of Anxiety, Arthritis, Depression, Diverticulosis of intestine (2010), Esophagitis, reflux (2010 ), GERD (gastroesophageal reflux disease), and Impaired fasting glucose (01/25/2012).    reports that she has quit smoking. Her smoking use included cigarettes. She has a 35.00 pack-year smoking history. She has never used smokeless tobacco. She reports previous alcohol use. She reports that she does not use drugs.   The roster of all physicians providing medical care to patient : Patient Care Team: Crecencio Mc, MD as PCP - General (Internal Medicine)  Activities of daily living:  The patient is 100% independent in all ADLs: dressing, toileting, feeding as well as independent mobility Fall risk was assessed by direct patient evaluation of patient's balance, gait and ability to risk from a chair and from a kneeling position. Home safety : The patient has smoke detectors in the home. They wear seatbelts.  There are no firearms at home. There is no violence in the home.  Patient has seen their eye doctor in the last year.   Visual acuity was assessed today  and was 20/20 without correction lenses. Patient denies any  hearing difficulty with regard to whispered voices and some television programs.  They have deferred audiologic testing in the last year.   There is no risks for hepatitis, STDs or HIV. There is no   history of blood transfusion. They have no travel history to infectious disease endemic areas of the world.  The patient has seen their dentist in the last six month. She has  excessive sun exposure. Discussed the need for sun protection: hats, long sleeves and use of sunscreen if there is significant sun exposure.   Diet: the importance of a healthy diet is discussed. She admits to having a diet that includes  junk food at times. .  The benefits of regular aerobic exercise were discussed. Patient does not exercise regularly due to chronic sciatica .   Depression screen:   Depression screen [facility] 2/9 08/24/2020 11/15/2019 10/11/2016  Decreased Interest 0 1 0  Down, Depressed, Hopeless 0 1 0  PHQ - 2 Score 0 2 0  Altered sleeping - 2 -  Tired, decreased energy - 1 -  Change in appetite - 0 -  Feeling bad or failure about yourself  - 0 -  Trouble concentrating - 2 -  Moving slowly or fidgety/restless - 0 -  Suicidal thoughts - 0 -  PHQ-9 Score - 7 -  Difficult doing work/chores - Not difficult at all -       Cognitive assessment: the patient manages all their financial and personal affairs and is actively engaged. They could relate day,date,year and events; recalled 2/3 objects at 3 minutes; performed clock-face test normally.  The following portions of the patient's history were reviewed and updated as appropriate: allergies, current medications, past family history, past medical history,  past surgical history, past social history  and problem list.  Visual acuity was not assessed per patient preference since she has regular follow up with her ophthalmologist. Hearing and body mass index were assessed and reviewed.   During the course of the visit the patient was educated and counseled about appropriate screening and preventive services including : fall prevention , diabetes screening, nutrition counseling, colorectal cancer screening, and recommended immunizations  Immunization History  Administered Date(s) Administered  . Influenza Split 03/31/2014, 04/23/2015  . Influenza,inj,Quad PF,6+ Mos 05/04/2018  . Influenza-Unspecified 06/19/2017, 05/11/2019, 04/11/2020  . Janssen (J&J) SARS-COV-2 Vaccination 10/24/2019, 05/15/2020  . Td 11/15/2019  . Tdap 07/01/2010  HMLISTPATIENTFRIENDLY@ Health Maintenance Due  Topic Date Due  . PNEUMOCOCCAL POLYSACCHARIDE VACCINE AGE 61-64 HIGH RISK  Never  done  . HIV Screening  Never done  . PAP SMEAR-Modifier  02/01/2019  . MAMMOGRAM  09/13/2019  . URINE MICROALBUMIN  08/19/2020    Last skin cancer screening : 2021 per patient      Vital Signs: BP (!) 160/90 (BP Location: Left Arm, Patient Position: Sitting)   Pulse 91   Temp 98.1 F (36.7 C)   Ht 5\' 10"  (1.778 m)   Wt 286 lb 12.8 oz (130.1 kg)   SpO2 95%   BMI 41.15 kg/m    Exam: . General Appearance:    Alert, cooperative, no distress, appears stated age  Head:    Normocephalic, without obvious abnormality, atraumatic  Eyes:    PERRL, conjunctiva/corneas clear, EOM's intact, fundi    benign, both eyes  Ears:    Normal TM's and external ear canals, both ears  Nose:   Nares normal, septum midline, mucosa normal, no drainage    or sinus tenderness  Throat:   Lips, mucosa, and tongue normal; teeth and gums normal  Neck:   Supple, symmetrical, trachea midline, no adenopathy;    thyroid:  no enlargement/tenderness/nodules; no carotid   bruit or JVD  Back:     Symmetric, no curvature, ROM normal, no CVA tenderness  Lungs:     Clear to auscultation bilaterally, respirations unlabored  Chest Wall:    No tenderness or deformity   Heart:    Regular rate and rhythm, S1 and S2 normal, no murmur, rub   or gallop  Breast Exam:    Pendulous, No tenderness, masses, or nipple abnormality  Abdomen:     Soft, non-tender, bowel sounds active all four quadrants,    no masses, no organomegaly  Genitalia:    Pelvic: cervix difficult to assess due to vaginal stenosis and atrophic appearance, external genitalia normal, no adnexal masses or tenderness, no cervical motion tenderness, rectovaginal septum normal, uterus surgically absent and vaginal walls atrophic without discharge  Extremities:   Extremities normal, atraumatic, no cyanosis or edema  Pulses:   2+ and symmetric all extremities  Skin:   Skin color, texture, turgor normal, no rashes or lesions  Lymph nodes:   Cervical,  supraclavicular, and axillary nodes normal  Neurologic:   CNII-XII intact, normal strength, sensation and reflexes    throughout     End of Life Discussion and Planning   During the course of the visit , End of Life objectives were discussed at length.  Patient has recently obtained a living will  And  healthcare power of attorney.    Review of Opioid Prescriptions    Patient has a not have a current opioid prescription. Patient's risk factors for opioid use disorder was reviewed and includes none.  Treatment of pain using non-opioid alternatives was tried and failed previously.    Elevated blood-pressure reading, without diagnosis of hypertension She has no history of hypertension but has an elevated reading   Today   She has been asked to check her pressures at home and submit readings for evaluation. Renal function is normal   Lab Results  Component Value Date   CREATININE 0.81 08/11/2020   Lab  Results  Component Value Date   NA 139 08/11/2020   K 4.3 08/11/2020   CL 100 08/11/2020   CO2 31 08/11/2020   Lab Results  Component Value Date   MICROALBUR 1.6 08/20/2019   MICROALBUR 1.1 03/17/2017       DDD (degenerative disc disease), lumbar Persistent despite conservative treatment.  MRI notes L4 nerve root abutting a bulging disk , but Neurosurgery has not offered definitive treatment. 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  She is now receiving disability . Recommend that she resume  attempts at  Weight loss continue celebrex ,  tramadol regularly and 10 mg of oxycodone daily for nocturnal pain. Continue cymbalta for management of depressive symptoms brought on by chronic pain  And reduced doseof  Citalopram   at 20 mg daily.  Narcotics Refill history confirmed via [place] Controlled Substance databas, accessed by me today..She recently shoveled snow ,  Which has  been discouraged , along with doing "yardwork and gardening"  In favor of starting a program of  walking regularly   Welcome to Medicare preventive visit age appropriate education and counseling updated, referrals for preventative services and immunizations addressed, dietary and smoking counseling addressed, most recent labs reviewed.  I have personally reviewed and have noted:  1) the patient's medical and social history 2) The pt's use of alcohol, tobacco, and illicit drugs 3) The patient's current medications and supplements 4) Functional ability including ADL's, fall risk, home safety risk, hearing and visual impairment 5) Diet and physical activities 6) Evidence for depression or mood disorder 7) The patient's height, weight, and BMI have been recorded in the chart  I have made referrals, and provided counseling and education based on review of the above  Encounter for Papanicolaou smear for cervical cancer screening PAP smear attempted but visualization of cervix was limited by vaginal atrophy and stenosis causing extreme patient discomfort.  She may need to resume estrogen vaginal cream prior to next attempt if enodcervical zone was not sampled.    Updated Medication List Outpatient Encounter Medications as of 08/24/2020  Medication Sig  . aspirin EC 81 MG tablet Take 81 mg by mouth daily.  . celecoxib (CELEBREX) 200 MG capsule TAKE 1 CAPSULE BY MOUTH TWICE DAILY  . cetirizine (ZYRTEC) 10 MG tablet Take 10 mg by mouth daily.   Marland Kitchen conjugated estrogens (PREMARIN) vaginal cream Place one applicatorful vaginally two to three times weekly  . methocarbamol (ROBAXIN) 500 MG tablet Take 1 tablet (500 mg total) by mouth 4 (four) times daily.  . Omega-3 Fatty Acids (FISH OIL) 1000 MG CAPS Take 1,000 mg by mouth daily.   Marland Kitchen omeprazole (PRILOSEC) 20 MG capsule TAKE 1 CAPSULE BY MOUTH TWICE DAILY BEFORE A MEAL  . oxyCODONE (OXY IR/ROXICODONE) 5 MG immediate release tablet TAKE ONE TABLET BY  MOUTH 3 TIMES DAILY AS NEEDED FOR SEVERE PAIN  . oxyCODONE (OXY IR/ROXICODONE) 5 MG immediate release tablet TAKE ONE TABLET BY MOUTH 3 TIMES DAILY AS NEEDED FOR SEVERE PAIN  . simvastatin (ZOCOR) 20 MG tablet TAKE ONE TABLET AT BEDTIME  . spironolactone (ALDACTONE) 50 MG tablet TAKE ONE TABLET BY MOUTH EVERY DAY  . traMADol (ULTRAM) 50 MG tablet TAKE 1-2 TABLETS BY MOUTH EVERY 6 HOURS AS NEEDED FOR SEVERE BACK PAIN. MAX OF 6TABS PER DAY.  . traMADol (ULTRAM) 50 MG tablet TAKE 1-2 TABLTS BY  MOUTH EVERY 6 HOURS AS NEEDED FOR SEVERE BACK PAIN MAX OF 6/DAY  . traMADol (ULTRAM) 50 MG tablet TAKE 1-2 TABLETS BY MOUTH EVERY 6 HOURS AS NEEDED FOR SEVERE BACK PAIN. MAX OF 6TABS PER DAY.  . [DISCONTINUED] DULoxetine (CYMBALTA) 20 MG capsule TAKE 1 CAPSULE BY MOUTH ONCE DAILY AFTER SUPPER   No facility-administered encounter medications on file as of 08/24/2020.

## 2020-08-24 NOTE — Patient Instructions (Signed)
.Your annual mammogram has been ordered.  You are encouraged (required) to call to make your appointment at [facility].  10% OVER THE NEXT 6 MONTHS IS A RESPECTABLE GOAL  This is my  example of a  "Low GI"  Diet:  It will allow you to lose 4 to 8  lbs  per month if you follow it carefully.  Your goal with exercise is a minimum of 30 minutes of aerobic exercise 5 days per week (Walking does not count once it becomes easy!)    All of the foods can be found at grocery stores and in bulk at [employer].  The Atkins protein bars and shakes are available in more varieties at Target, [employer] and [place].     7 AM Breakfast:  Choose from the following:  Low carbohydrate Protein  Shakes (I recommend the EAS AdvantEdge "Carb Control" shakes  Or the low carb shakes by Atkins.    2.5 carbs)   Arnold's "Sandwhich Thin"toasted  w/ peanut butter (no jelly: about 20 net carbs  "Bagel Thin" with cream cheese and salmon: about 20 carbs   a scrambled egg/bacon/cheese burrito made with Mission's "carb balance" whole wheat tortilla  (about 10 net carbs )  Bj's sells a frittata (basically a quiche without the pastry crust) that is eaten cold and very convenient way to get your eggs  If you make your own shakes, avoid bananas and pineapple,  And use low carb greek yogurt or almond milk    Avoid cereal and bananas, oatmeal and cream of wheat and grits. They are loaded with carbohydrates!   10 AM: high protein snack:  Protein bar by Atkins (the snack size, under 200 cal, usually < 6 net carbs).    A stick of cheese:  Around 1 carb,  100 cal     Dannon Light n Fit [place] Yogurt  (80 cal, 8 carbs)  Other so called "protein bars" and Greek yogurts tend to be loaded with carbohydrates.  Remember, in food advertising, the word "energy" is synonymous for " carbohydrate."  Lunch:   A Sandwich using the bread choices listed, Can use any  Eggs,  lunchmeat, grilled meat or canned  tuna), avocado, regular mayo/mustard  and cheese.  A Salad using blue cheese, ranch,  Goddess or vinagrette,  Avoid taco shells, croutons or "confetti" and no "candied nuts" but regular nuts OK.   No pretzels or chips.  Pickles and miniature sweet peppers are a good low carb alternative that provide a "crunch"  The bread is the only source of carbohydrate in a sandwich and  can be decreased by trying some of these alternatives to traditional loaf bread  Joseph's makes a pita bread and a flat bread that are 50 cal and 4 net carbs available at [place] and [place].  This can be toasted to use with hummous as well  Toufayan makes a low carb flatbread that's 100 cal and 9 net carbs available at [employer] and BJ's makes 2 sizes of  Low carb whole wheat tortilla  (The large one is 210 cal and 6 net carbs)  Ezekiel bread is a loaf bread sold in the frozen section of higher end grocery chains,  Very low carb  Avoid "Low fat dressings, as well as Barry Brunner and Noel dressings They are loaded with sugar!   3 PM/ Mid day  Snack:  Consider  1 ounce of  almonds, walnuts, pistachios, pecans, peanuts,  Macadamia nuts or a nut medley.  Avoid "granola"; the dried cranberries and raisins are loaded with carbohydrates. Mixed nuts as long as there are no raisins,  cranberries or dried fruit.    Try the prosciutto/mozzarella cheese sticks by Fiorruci  In deli /backery section   High protein      6 PM  Dinner:     Meat/fowl/fish with a green salad, and either broccoli, cauliflower, green beans, spinach, brussel sprouts or  Lima beans. DO NOT BREAD THE PROTEIN!!      There is a low carb pasta by Dreamfield's that is acceptable and tastes great: only 5 digestible carbs/serving.( All grocery stores but BJs carry it )  Try Hurley Cisco Angelo's chicken piccata or chicken or eggplant parm over low carb pasta.(Lowes and BJs)   Marjory Lies Sanchez's "Carnitas" (pulled pork, no sauce,  0 carbs) or his beef pot roast to  make a dinner burrito (at BJ's)  Pesto over low carb pasta (bj's sells a good quality pesto in the center refrigerated section of the deli   Try satueeing  Cheral Marker with mushroooms  Whole wheat pasta is still full of digestible carbs and  Not as low in glycemic index as Dreamfield's.   Brown rice is still rice,  So skip the rice and noodles if you eat [place] or [place] (or at least limit to 1/2 cup)  9 PM snack :   Breyer's "low carb" fudgsicle or  ice cream bar (Carb Smart line), or  Weight Watcher's ice cream bar , or another "no sugar added" ice cream;  a serving of fresh berries/cherries with whipped cream   Cheese or DANNON'S LlGHT N FIT GREEK YOGURT  8 ounces of [place] unsweetened almond/cococunut milk    Treat yourself to a parfait made with whipped cream blueberiies, walnuts and vanilla greek yogurt  Avoid bananas, pineapple, grapes  and watermelon on a regular basis because they are high in sugar.  THINK OF THEM AS DESSERT  Remember that snack Substitutions should be less than 10 NET carbs per serving and meals < 20 carbs. Remember to subtract fiber grams to get the "net carbs."

## 2020-08-25 ENCOUNTER — Encounter: Payer: Self-pay | Admitting: Internal Medicine

## 2020-08-25 DIAGNOSIS — R030 Elevated blood-pressure reading, without diagnosis of hypertension: Secondary | ICD-10-CM | POA: Insufficient documentation

## 2020-08-25 DIAGNOSIS — Z124 Encounter for screening for malignant neoplasm of cervix: Secondary | ICD-10-CM | POA: Insufficient documentation

## 2020-08-25 DIAGNOSIS — I10 Essential (primary) hypertension: Secondary | ICD-10-CM | POA: Insufficient documentation

## 2020-08-25 LAB — CYTOLOGY - PAP
Adequacy: ABSENT
Comment: NEGATIVE
Diagnosis: NEGATIVE
High risk HPV: NEGATIVE

## 2020-08-25 NOTE — Assessment & Plan Note (Signed)
Persistent despite conservative treatment.  MRI notes L4 nerve root abutting a bulging disk , but Neurosurgery has not offered definitive treatment. 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  She is now receiving disability . Recommend that she resume  attempts at  Weight loss continue celebrex ,  tramadol regularly and 10 mg of oxycodone daily for nocturnal pain. Continue cymbalta for management of depressive symptoms brought on by chronic pain  And reduced doseof  Citalopram   at 20 mg daily.  Narcotics Refill history confirmed via  Controlled Substance databas, accessed by me today..She recently shoveled snow ,  Which has been discouraged , along with doing "yardwork and gardening"  In favor of starting a program of  walking regularly

## 2020-08-25 NOTE — Assessment & Plan Note (Signed)
She has no history of hypertension but has an elevated reading   Today   She has been asked to check her pressures at home and submit readings for evaluation. Renal function is normal   Lab Results  Component Value Date   CREATININE 0.81 08/11/2020   Lab Results  Component Value Date   NA 139 08/11/2020   K 4.3 08/11/2020   CL 100 08/11/2020   CO2 31 08/11/2020   Lab Results  Component Value Date   MICROALBUR 1.6 08/20/2019   MICROALBUR 1.1 03/17/2017

## 2020-08-25 NOTE — Assessment & Plan Note (Signed)
PAP smear attempted but visualization of cervix was limited by vaginal atrophy and stenosis causing extreme patient discomfort.  She may need to resume estrogen vaginal cream prior to next attempt if enodcervical zone was not sampled.

## 2020-08-25 NOTE — Assessment & Plan Note (Signed)

## 2020-09-09 ENCOUNTER — Ambulatory Visit
Admission: RE | Admit: 2020-09-09 | Discharge: 2020-09-09 | Disposition: A | Discharge status: Home / Self Care | Payer: Medicare Other | Source: Ambulatory Visit | Attending: Internal Medicine | Admitting: Internal Medicine

## 2020-09-09 ENCOUNTER — Other Ambulatory Visit: Payer: Self-pay

## 2020-09-09 DIAGNOSIS — Z1231 Encounter for screening mammogram for malignant neoplasm of breast: Secondary | ICD-10-CM | POA: Insufficient documentation

## 2020-09-14 ENCOUNTER — Other Ambulatory Visit: Payer: Self-pay

## 2020-09-14 ENCOUNTER — Other Ambulatory Visit: Payer: Self-pay | Admitting: Internal Medicine

## 2020-09-14 MED ORDER — ESTROGENS, CONJUGATED 0.625 MG/GM VA CREA: TOPICAL_CREAM | VAGINAL | 3 refills | Status: DC

## 2020-09-14 MED ORDER — CELECOXIB 200 MG PO CAPS: 200.0000 mg | ORAL_CAPSULE | Freq: Two times a day (BID) | ORAL | 3 refills | Status: DC

## 2020-09-14 MED ORDER — SPIRONOLACTONE 50 MG PO TABS: 50.0000 mg | ORAL_TABLET | Freq: Every day | ORAL | 3 refills | Status: DC

## 2020-10-19 ENCOUNTER — Other Ambulatory Visit: Payer: Self-pay | Admitting: Internal Medicine

## 2020-10-23 ENCOUNTER — Other Ambulatory Visit: Payer: Self-pay

## 2020-10-23 DIAGNOSIS — H524 Presbyopia: Secondary | ICD-10-CM | POA: Diagnosis not present

## 2020-10-23 DIAGNOSIS — H2513 Age-related nuclear cataract, bilateral: Secondary | ICD-10-CM | POA: Diagnosis not present

## 2020-10-23 NOTE — Telephone Encounter (Signed)
Refilled: 09/15/2020 Last OV: 08/24/2020 Next OV: 11/25/2020

## 2020-10-24 MED ORDER — OXYCODONE HCL 5 MG PO TABS: 5.0000 mg | ORAL_TABLET | Freq: Four times a day (QID) | ORAL | 0 refills | Status: DC | PRN

## 2020-11-16 ENCOUNTER — Other Ambulatory Visit: Payer: Self-pay | Admitting: Internal Medicine

## 2020-11-16 DIAGNOSIS — R7303 Prediabetes: Secondary | ICD-10-CM

## 2020-11-17 ENCOUNTER — Telehealth: Payer: Self-pay

## 2020-11-17 NOTE — Chronic Care Management (AMB) (Signed)
  Chronic Care Management   Note  11/17/2020 Name: Lisa Valencia MRN: [id number] DOB: Oct 12, 1960  Lisa Valencia is a 60 y.o. year old female who is a primary care patient of Derrel Nip, Aris Everts, MD. I reached out to [name] by phone today in response to a referral sent by [name]'s PCP, Crecencio Mc, MD     [name] was given information about Chronic Care Management services today including:  1. CCM service includes personalized support from designated clinical staff supervised by her physician, including individualized plan of care and coordination with other care providers 2. 24/7 contact phone numbers for assistance for urgent and routine care needs. 3. Service will only be billed when office clinical staff spend 20 minutes or more in a month to coordinate care. 4. Only one practitioner may furnish and bill the service in a calendar month. 5. The patient may stop CCM services at any time (effective at the end of the month) by phone call to the office staff. 6. The patient will be responsible for cost sharing (co-pay) of up to 20% of the service fee (after annual deductible is met).  Patient agreed to services and verbal consent obtained.   Follow up plan: Telephone appointment with care management team member scheduled for:11/25/2020  [name], [place], [place], [place] [address] Direct Dial: [phone] [email or web address]

## 2020-11-17 NOTE — Chronic Care Management (AMB) (Signed)
  Chronic Care Management   Outreach Note  11/17/2020 Name: Lisa Valencia MRN: [id number] DOB: 01-Oct-1960  Lisa Valencia is a 60 y.o. year old female who is a primary care patient of Derrel Nip, Aris Everts, MD. I reached out to Lisa Valencia by phone today in response to a referral sent by Lisa Valencia's PCP, Crecencio Mc, MD     An unsuccessful telephone outreach was attempted today. The patient was referred to the case management team for assistance with care management and care coordination.   Follow Up Plan: A HIPAA compliant phone message was left for the patient providing contact information and requesting a return call.  The care management team will reach out to the patient again over the next 5 days.  If patient returns call to provider office, please advise to call [facility]  at [place], [place], [place], [place] [address] Direct Dial: [phone] [email or web address]: [email or web address]

## 2020-11-25 ENCOUNTER — Encounter: Payer: Self-pay | Admitting: Internal Medicine

## 2020-11-25 ENCOUNTER — Other Ambulatory Visit: Payer: Self-pay

## 2020-11-25 ENCOUNTER — Ambulatory Visit (INDEPENDENT_AMBULATORY_CARE_PROVIDER_SITE_OTHER): Payer: Medicare Other | Admitting: Internal Medicine

## 2020-11-25 ENCOUNTER — Ambulatory Visit (INDEPENDENT_AMBULATORY_CARE_PROVIDER_SITE_OTHER): Payer: Medicare Other | Admitting: Pharmacist

## 2020-11-25 VITALS — BP 138/82 | HR 83 | Temp 98.2°F | Resp 15 | Ht 70.0 in | Wt 288.5 lb

## 2020-11-25 VITALS — Wt 288.4 lb

## 2020-11-25 DIAGNOSIS — R7303 Prediabetes: Secondary | ICD-10-CM | POA: Diagnosis not present

## 2020-11-25 DIAGNOSIS — R030 Elevated blood-pressure reading, without diagnosis of hypertension: Secondary | ICD-10-CM | POA: Diagnosis not present

## 2020-11-25 DIAGNOSIS — K760 Fatty (change of) liver, not elsewhere classified: Secondary | ICD-10-CM | POA: Diagnosis not present

## 2020-11-25 DIAGNOSIS — M5136 Other intervertebral disc degeneration, lumbar region: Secondary | ICD-10-CM

## 2020-11-25 DIAGNOSIS — M1612 Unilateral primary osteoarthritis, left hip: Secondary | ICD-10-CM

## 2020-11-25 DIAGNOSIS — Z720 Tobacco use: Secondary | ICD-10-CM

## 2020-11-25 DIAGNOSIS — E782 Mixed hyperlipidemia: Secondary | ICD-10-CM | POA: Diagnosis not present

## 2020-11-25 MED ORDER — PEN NEEDLES 32G X 4 MM MISC: 1 refills | Status: DC

## 2020-11-25 MED ORDER — METHOCARBAMOL 500 MG PO TABS: 500.0000 mg | ORAL_TABLET | Freq: Four times a day (QID) | ORAL | 0 refills | Status: DC

## 2020-11-25 MED ORDER — SIMVASTATIN 20 MG PO TABS: 20.0000 mg | ORAL_TABLET | Freq: Every day | ORAL | 3 refills | Status: DC

## 2020-11-25 MED ORDER — OXYCODONE HCL 5 MG PO TABS: 5.0000 mg | ORAL_TABLET | Freq: Four times a day (QID) | ORAL | 0 refills | Status: DC | PRN

## 2020-11-25 MED ORDER — OXYCODONE HCL 5 MG PO TABS: 5.0000 mg | ORAL_TABLET | Freq: Two times a day (BID) | ORAL | 0 refills | Status: DC | PRN

## 2020-11-25 MED ORDER — OZEMPIC (0.25 OR 0.5 MG/DOSE) 2 MG/1.5ML ~~LOC~~ SOPN: 0.5000 mg | PEN_INJECTOR | SUBCUTANEOUS | 1 refills | Status: DC

## 2020-11-25 NOTE — Progress Notes (Signed)
Subjective:  Patient ID: Lisa Valencia, female    DOB: February 15, 1961  Age: 60 y.o. MRN: [id number]  CC: The primary encounter diagnosis was Hepatic steatosis. Diagnoses of Prediabetes, Elevated blood-pressure reading, without diagnosis of hypertension, Morbid obesity ([place]), and DDD (degenerative disc disease), lumbar were also pertinent to this visit.  HPI Lisa Valencia presents for follow up on chronic back and hip pain managed with celebrex and oxycodone and complicated by morbid obesity   Medication regimen reviewed:  during the day she manages her pain with celebrex, and tylenol and occasionally a  tramadol.  Using oxycodone at night only,  With a tramadol .  Still does yard work . We discussed suspending the tramadol    Obesity discussed.  She has not lost any weight in the past year.  She works in her yard and garden  But does not do any other physical activity.   hates the idea of a gym but did benefit in the past from working with a trainer at the [facility] gym who helped her mobility. Afraid of falling and not being able to get up on her own.  Diet reviewed:  Eats junk food, including ice cream    Outpatient Medications Prior to Visit  Medication Sig Dispense Refill  . aspirin EC 81 MG tablet Take 81 mg by mouth daily.    . celecoxib (CELEBREX) 200 MG capsule Take 1 capsule (200 mg total) by mouth 2 (two) times daily. 180 capsule 3  . cetirizine (ZYRTEC) 10 MG tablet Take 10 mg by mouth daily.     Marland Kitchen conjugated estrogens (PREMARIN) vaginal cream Place one applicatorful vaginally two to three times weekly 90 g 3  . Omega-3 Fatty Acids (FISH OIL) 1000 MG CAPS Take 1,000 mg by mouth daily.     Marland Kitchen omeprazole (PRILOSEC) 20 MG capsule TAKE ONE CAPSULE BY MOUTH TWICE DAILY BEFORE A MEAL 60 capsule 3  . Semaglutide,0.25 or 0.5MG /DOS, (OZEMPIC, 0.25 OR 0.5 MG/DOSE,) 2 MG/1.5ML SOPN Inject 0.5 mg into the skin once a week. 1.5 mL 1  . spironolactone (ALDACTONE) 50 MG tablet Take 1 tablet (50 mg  total) by mouth daily. 90 tablet 3  . vitamin E 200 UNIT capsule Take 200 Units by mouth daily.    Marland Kitchen oxyCODONE (OXY IR/ROXICODONE) 5 MG immediate release tablet Take 1 tablet (5 mg total) by mouth every 6 (six) hours as needed for severe pain. 90 tablet 0  . simvastatin (ZOCOR) 20 MG tablet TAKE ONE TABLET AT BEDTIME 90 tablet 3  . traMADol (ULTRAM) 50 MG tablet TAKE 1-2 TABLETS BY MOUTH EVERY 6 HOURS AS NEEDED FOR SEVERE BACK PAIN. MAX OF 6TABS PER DAY. 180 tablet 5  . methocarbamol (ROBAXIN) 500 MG tablet Take 1 tablet (500 mg total) by mouth 4 (four) times daily. (Patient not taking: No sig reported) 90 tablet 0   No facility-administered medications prior to visit.    Review of Systems;  Patient denies headache, fevers, malaise, unintentional weight loss, skin rash, eye pain, sinus congestion and sinus pain, sore throat, dysphagia,  hemoptysis , cough, dyspnea, wheezing, chest pain, palpitations, orthopnea, edema, abdominal pain, nausea, melena, diarrhea, constipation, flank pain, dysuria, hematuria, urinary  Frequency, nocturia, numbness, tingling, seizures,  Focal weakness, Loss of consciousness,  Tremor, insomnia, depression, anxiety, and suicidal ideation.      Objective:  BP 138/82 (BP Location: Left Arm, Patient Position: Sitting, Cuff Size: Large)   Pulse 83   Temp 98.2 F (36.8 C) (Oral)  Resp 15   Ht 5\' 10"  (1.778 m)   Wt 288 lb 8 oz (130.9 kg)   SpO2 96%   BMI 41.40 kg/m   BP Readings from Last 3 Encounters:  11/25/20 138/82  08/24/20 (!) 160/90  05/11/20 136/82    Wt Readings from Last 3 Encounters:  11/25/20 288 lb 8 oz (130.9 kg)  11/25/20 288 lb 6.4 oz (130.8 kg)  08/24/20 286 lb 12.8 oz (130.1 kg)    General appearance: alert, cooperative and appears stated age Ears: normal TM's and external ear canals both ears Throat: lips, mucosa, and tongue normal; teeth and gums normal Neck: no adenopathy, no carotid bruit, supple, symmetrical, trachea midline and  thyroid not enlarged, symmetric, no tenderness/mass/nodules Back: symmetric, no curvature. ROM normal. No CVA tenderness. Lungs: clear to auscultation bilaterally Heart: regular rate and rhythm, S1, S2 normal, no murmur, click, rub or gallop Abdomen: soft, non-tender; bowel sounds normal; no masses,  no organomegaly Pulses: 2+ and symmetric Skin: Skin color, texture, turgor normal. No rashes or lesions Lymph nodes: Cervical, supraclavicular, and axillary nodes normal.  Lab Results  Component Value Date   HGBA1C 6.1 08/11/2020   HGBA1C 6.0 08/20/2019   HGBA1C 6.1 02/12/2019    Lab Results  Component Value Date   CREATININE 0.81 08/11/2020   CREATININE 0.86 02/24/2020   CREATININE 0.82 08/20/2019    Lab Results  Component Value Date   WBC 4.6 07/03/2018   HGB 13.8 07/03/2018   HCT 41.9 07/03/2018   PLT 198.0 07/03/2018   GLUCOSE 110 (H) 08/11/2020   CHOL 157 08/11/2020   TRIG 206.0 (H) 08/11/2020   HDL 37.50 (L) 08/11/2020   LDLDIRECT 94.0 08/11/2020   LDLCALC 160 (H) 05/17/2012   ALT 30 08/11/2020   AST 20 08/11/2020   NA 139 08/11/2020   K 4.3 08/11/2020   CL 100 08/11/2020   CREATININE 0.81 08/11/2020   BUN 18 08/11/2020   CO2 31 08/11/2020   TSH 0.76 02/01/2016   INR 0.90 04/19/2018   HGBA1C 6.1 08/11/2020   MICROALBUR 1.6 08/20/2019    MM 3D SCREEN BREAST BILATERAL  Result Date: 09/13/2020 CLINICAL DATA:  Screening. EXAM: DIGITAL SCREENING BILATERAL MAMMOGRAM WITH TOMOSYNTHESIS AND CAD TECHNIQUE: Bilateral screening digital craniocaudal and mediolateral oblique mammograms were obtained. Bilateral screening digital breast tomosynthesis was performed. The images were evaluated with computer-aided detection. COMPARISON:  Previous exam(s). ACR Breast Density Category a: The breast tissue is almost entirely fatty. FINDINGS: There are no findings suspicious for malignancy. IMPRESSION: No mammographic evidence of malignancy. A result letter of this screening mammogram  will be mailed directly to the patient. RECOMMENDATION: Screening mammogram in one year. (Code:[id number]-B-01Y) BI-RADS CATEGORY  1: Negative. Electronically Signed   By: Lillia Mountain M.D.   On: 09/13/2020 11:27    Assessment & Plan:   Problem List Items Addressed This Visit      Unprioritized   DDD (degenerative disc disease), lumbar    Persistent despite conservative treatment.  MRI notes L4 nerve root abutting a bulging disk , but Neurosurgery has not offered definitive treatment, likely because she needs to lose weight.  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  She is now receiving disability . Recommend that she resume  attempts at  Weight loss continue celebrex  And tylenol.  Suspending tramadol given use of  10 mg of oxycodone daily for  nocturnal pain. Continue cymbalta for management of depressive symptoms brought on by chronic pain  And reduced doseof  Citalopram   at 20 mg daily.  Narcotics Refill history confirmed via [place] Controlled Substance databas, accessed by me today..  I again have discouraged her from doing yardwork and gardening because of the strain it puts on her back In favor of starting a program of  walking regularly       Relevant Medications   methocarbamol (ROBAXIN) 500 MG tablet   oxyCODONE (ROXICODONE) 5 MG immediate release tablet   Elevated blood-pressure reading, without diagnosis of hypertension    She has no history of hypertension. Home readings have been 130/80 or less. Renal function is normal   Lab Results  Component Value Date   CREATININE 0.81 08/11/2020   Lab Results  Component Value Date   NA 139 08/11/2020   K 4.3 08/11/2020   CL 100 08/11/2020   CO2 31 08/11/2020   Lab Results  Component Value Date   MICROALBUR 1.6 08/20/2019   MICROALBUR 1.1 03/17/2017           Hepatic steatosis - Primary   Relevant Orders   Comprehensive  metabolic panel   Morbid obesity ([place])    strongly advised her to lose weight using adherence to diet and regular exercise using a trainer .  Pharmacotherapy trial advised  With [id number]. .        Prediabetes    Treated with diet given history  recurrent hypoglycemic symptoms with metformin even at the lowest dose.  She is willing to try [facility] for appetite suppression  Lab Results  Component Value Date   HGBA1C 6.1 08/11/2020         Relevant Orders   Hemoglobin A1c      I have discontinued Juliann Pulse A. Minish's traMADol. I have also changed her simvastatin. Additionally, I am having her maintain her Fish Oil, cetirizine, aspirin EC, conjugated estrogens, celecoxib, spironolactone, omeprazole, vitamin E, Ozempic (0.25 or 0.5 MG/DOSE), methocarbamol, and oxyCODONE.  Meds ordered this encounter  Medications  . simvastatin (ZOCOR) 20 MG tablet    Sig: Take 1 tablet (20 mg total) by mouth at bedtime.    Dispense:  90 tablet    Refill:  3  . methocarbamol (ROBAXIN) 500 MG tablet    Sig: Take 1 tablet (500 mg total) by mouth 4 (four) times daily.    Dispense:  90 tablet    Refill:  0  . DISCONTD: oxyCODONE (OXY IR/ROXICODONE) 5 MG immediate release tablet    Sig: Take 1 tablet (5 mg total) by mouth every 6 (six) hours as needed for severe pain.    Dispense:  90 tablet    Refill:  0  . oxyCODONE (ROXICODONE) 5 MG immediate release tablet    Sig: Take 1 tablet (5 mg total) by mouth 2 (two) times daily as needed for severe pain.    Dispense:  60 tablet    Refill:  0    DISREGARD /CANCEL THE PREVIOUS RX FOR #90.  QUANTITY REDUCED TO 60/MONTH   A total of 40 minutes was spent with patient more than half of which was spent in counseling patient on her diet and lifestyle and the necessary changes needed to effect signigicant weight loss,  The effect of her obesity on her chronic back pain,  reviewing and explaining recent labs and imaging studies done, and coordination of care. Medications  Discontinued During This Encounter  Medication Reason  . methocarbamol (  ROBAXIN) 500 MG tablet Reorder  . simvastatin (ZOCOR) 20 MG tablet Reorder  . oxyCODONE (OXY IR/ROXICODONE) 5 MG immediate release tablet Reorder  . traMADol (ULTRAM) 50 MG tablet   . oxyCODONE (OXY IR/ROXICODONE) 5 MG immediate release tablet     Follow-up: Return in about 3 months (around 02/25/2021).   Crecencio Mc, MD

## 2020-11-25 NOTE — Patient Instructions (Addendum)
I want you exercising 30 minutes  A day.  YARDWORK DOES NOT COUNT  Ozempic WILL reduce your appetite.  Increase the dose from 0.25 mg weekly  To 0.5 mg weekly after 4 weeks .  You may increase the dose ery 4 weeks if your weight plateaus.  Plan to reduce the size of your meals:   Healthy Choice low carb power bowl entrees ; these are are great low carb entrees that microwave in 5 minutes    210 cal and 12 net carbs   Use Premier Protein drinks for breakfast,  Or Muscle Milk,  Or Atkins shakes:   NO MORE TRAMADOL.  Use tylenol if you need to during the day   RETURN FOR NON FASTING LABS IN MID June

## 2020-11-25 NOTE — Patient Instructions (Addendum)
Lisa Valencia,   It was great meeting you today!  We are going to start Ozempic.   Inject 1.32 mg (10 clicks) once weekly for 4 weeks. Then, increase to 0.5 mg (19 clicks) once weekly for 4 weeks. Then, increase to 1 mg (37 clicks) once weekly for 4 weeks. Then, increase to 2 mg (74 clicks, all the way up to 2 mg mark) weekly. You can hold at any dose for longer than 4 weeks if you are having a hard time with stomach upset, nausea, queasiness. If you have any stomach symptoms that are severe - vomiting, constant nausea that does not improve- please stop the medication and call us.   Check and see if you and your husband together bring in less than $73,240 per year. If so, we can apply to get the Ozempic for free from the manufacturer.   Call me with any questions or concerns!  Catie Darnelle Maffucci, PharmD [phone]  Visit Information  CLINICAL CARE PLAN: Patient Care Plan: Medication Management    Problem Identified: Obesity, Chronic Pain, Pre-diabetes,     Long-Range Goal: Disease Progression Prevention   Start Date: 11/25/2020  This Visit's Progress: On track  Priority: High  Note:   Current Barriers:  . Unable to achieve control of weight   Pharmacist Clinical Goal(s):  Marland Kitchen Over the next 90 days, patient will achieve goal weight loss of 5-10% of baseline body weight through collaboration with PharmD and provider.   Interventions: . 1:1 collaboration with Crecencio Mc, MD regarding development and update of comprehensive plan of care as evidenced by provider attestation and co-signature . Inter-disciplinary care team collaboration (see longitudinal plan of care) . Comprehensive medication review performed; medication list updated in electronic medical record  SDOH: . 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 Reviewed principals of Medicare drug coverage, including deductible stage vs coverage gap.  Pre-diabetes, Obesity: . Unable to achieve weight loss goals  through diet and physical activity alone; current treatment: none o Previously tried: OTC stimulants for weight loss; hx metformin - would have symptoms of low glucose before supper . Current meal patterns: breakfast: homemade plain yogurt (low fat, 2% milk), blueberries, orange juice concentrate; lunch: sliced [place] on whole wheat bread, mayo, tomato, lettuce, pickle chips, cheese, vegetables; dinner: salmon patties, vegetables; snacks: ice cream- low calorie fudgecicle; drinks: unsweet tea w/ splenda, 1 diet pepsi per day  . Current exercise: limited by back pain, but likes working in her garden; had joined the pool in [place] and was walking in the pool, but hasn't been back since COVID spike . Extensive discussion of GLP1, including mechanism of action, side effects. No personal or family history of medullary thyroid cancer, pancreatitis, or gallbladder disease. Script for [employer] sent to mail order pharmacy to determine cost. Sample provided today. Inject Ozempic 0.25 mg weekly for 4 weeks, then increase to 0.5 mg weekly. Will plan to titrate at weekly intervals as long as patient is tolerating to max tolerated dose or 2 mg weekly.  . Discussed income. Patient will check to see if they meet income cut off for [employer] patient assistance.   Hypertension: . Uncontrolled at home per patient report; current treatment: spironolactone 50 mg daily  . Current home readings: 130s/80s . Continue current regimen as above. Follow for impact of expected weight loss on BP.   Hyperlipidemia: . Controlled at goal LDL <100; current treatment: simvastatin 20 mg daily . Recommend to continue current regimen at this  time  Chronic Back Pain: . Moderately well managed but not optimally controlled; current regimen: celecoxib 200 mg BID, methocarbamol 500 mg PRN, tramadol 50 mg PRN, oxycodone 5 mg PRN - notes that she will take tramadol with oxycodone. Does indicate sedation when she takes opioids.  . Notes  remote hx acetaminophen, though denies significant benefit. Notes some use of Voltaren topical gel. History of lidocaine patches when she was working. Hx duloxetine, unclear reason for discontinuation per chart review . Discussed concerns with combination of tramadol and oxycodone. Discussed increased risk of overdose as patient ages. Will follow for recommendations for improved management of chronic back/hip pain moving forward. Weight loss may provide benefit.   Allergies: Marland Kitchen Moderately well controlled per patient report; current regimen: cetirizine 10 mg daily . Continue current regimen at this time  Menopausal Symptoms: . Well controlled per patient report; current regimen: premarin vaginal cream 1-2 times weekly . Continue current regimen at this time. Investigate cost concerns/eligibility for patient assistance moving forward  GERD: . Well controlled; omeprazole 20 mg BID . Hx reflux esophagitis w/ chronic NSAID use. Appropriate to continue BID PPI at this time.   Supplements: . Omega 3 fatty acids 1 g daily, vitamin E 200 units daily   Patient Goals/Self-Care Activities . Over the next 90 days, patient will:  - take medications as prescribed collaborate with provider on medication access solutions target a minimum of 150 minutes of moderate intensity exercise weekly engage in dietary modifications by moderating portion sizes  Follow Up Plan: Telephone follow up appointment with care management team member scheduled for: ~ 6 weeks       PATIENT GOALS: Goals Addressed              This Visit's Progress     Patient Stated   .  Self-Management (pt-stated)        Patient Goals/Self-Care Activities . Over the next 90 days, patient will:  - take medications as prescribed collaborate with provider on medication access solutions target a minimum of 150 minutes of moderate intensity exercise weekly engage in dietary modifications by moderating portion sizes        Print copy of patient instructions, educational materials, and care plan provided in person.  Plan: Telephone follow up appointment with care management team member scheduled for:  ~ 6 weeks  Catie Darnelle Maffucci, PharmD, [place], [place] Clinical Pharmacist [employer] at [employer] [phone]

## 2020-11-25 NOTE — Chronic Care Management (AMB) (Signed)
Chronic Care Management Pharmacy Note  11/25/2020 Name:  Lisa Valencia MRN:  [id number] DOB:  19-May-1961  Subjective: Lisa Valencia is an 60 y.o. year old female who is a primary patient of Tullo, Aris Everts, MD.  The CCM team was consulted for assistance with disease management and care coordination needs.    Engaged with patient face to face for initial visit in response to provider referral for pharmacy case management and/or care coordination services.   Consent to Services:  The patient was given the following information about Chronic Care Management services today, agreed to services, and gave verbal consent: 1. CCM service includes personalized support from designated clinical staff supervised by the primary care provider, including individualized plan of care and coordination with other care providers 2. 24/7 contact phone numbers for assistance for urgent and routine care needs. 3. Service will only be billed when office clinical staff spend 20 minutes or more in a month to coordinate care. 4. Only one practitioner may furnish and bill the service in a calendar month. 5.The patient may stop CCM services at any time (effective at the end of the month) by phone call to the office staff. 6. The patient will be responsible for cost sharing (co-pay) of up to 20% of the service fee (after annual deductible is met). Patient agreed to services and consent obtained.  Patient Care Team: Crecencio Mc, MD as PCP - General (Internal Medicine) De Hollingshead, RPH-CPP (Pharmacist)  Recent office visits: None recently  Recent consult visits: None recently  Hospital visits: None in previous 6 months  Objective:  Lab Results  Component Value Date   CREATININE 0.81 08/11/2020   CREATININE 0.86 02/24/2020   CREATININE 0.82 08/20/2019    Lab Results  Component Value Date   HGBA1C 6.1 08/11/2020   Last diabetic Eye exam: No results found for: HMDIABEYEEXA  Last diabetic Foot  exam: No results found for: HMDIABFOOTEX      Component Value Date/Time   CHOL 157 08/11/2020 0816   CHOL 248 (H) 05/17/2012 0804   TRIG 206.0 (H) 08/11/2020 0816   TRIG 238 (H) 05/17/2012 0804   HDL 37.50 (L) 08/11/2020 0816   HDL 40 05/17/2012 0804   CHOLHDL 4 08/11/2020 0816   VLDL 41.2 (H) 08/11/2020 0816   VLDL 48 (H) 05/17/2012 0804   LDLCALC 160 (H) 05/17/2012 0804   LDLDIRECT 94.0 08/11/2020 0816    Hepatic Function Latest Ref Rng & Units 08/11/2020 02/24/2020 08/20/2019  Total Protein 6.0 - 8.3 g/dL 7.8 7.8 7.5  Albumin 3.5 - 5.2 g/dL 4.7 4.7 4.7  AST 0 - 37 U/L 20 21 33  ALT 0 - 35 U/L 30 29 55(H)  Alk Phosphatase 39 - 117 U/L 47 59 48  Total Bilirubin 0.2 - 1.2 mg/dL 0.5 0.4 0.5  Bilirubin, Direct 0.0 - 0.2 mg/dL - - -    Lab Results  Component Value Date/Time   TSH 0.76 02/01/2016 02:57 PM   TSH 0.50 01/26/2015 11:27 AM    CBC Latest Ref Rng & Units 07/03/2018 04/19/2018 02/01/2016  WBC 4.0 - 10.5 K/uL 4.6 7.0 6.0  Hemoglobin 12.0 - 15.0 g/dL 13.8 15.6 14.3  Hematocrit 36.0 - 46.0 % 41.9 44.3 41.8  Platelets 150.0 - 400.0 K/uL 198.0 194 187.0   Clinical ASCVD: No  The 10-year ASCVD risk score Mikey Bussing DC Jr., et al., 2013) is: 14.8%   Values used to calculate the score:     Age: 77  years     Sex: Female     Is Non-Hispanic African American: No     Diabetic: Yes     Tobacco smoker: Yes     Systolic Blood Pressure: 138 mmHg     Is BP treated: No     HDL Cholesterol: 37.5 mg/dL     Total Cholesterol: 157 mg/dL      Social History   Tobacco Use  Smoking Status Former Smoker  . Packs/day: 1.00  . Years: 35.00  . Pack years: 35.00  . Types: Cigarettes  Smokeless Tobacco Never Used   BP Readings from Last 3 Encounters:  11/25/20 138/82  08/24/20 (!) 160/90  05/11/20 136/82   Pulse Readings from Last 3 Encounters:  11/25/20 83  08/24/20 91  05/11/20 95   Wt Readings from Last 3 Encounters:  11/25/20 288 lb 8 oz (130.9 kg)  11/25/20 288 lb 6.4 oz  (130.8 kg)  08/24/20 286 lb 12.8 oz (130.1 kg)    Assessment: Review of patient past medical history, allergies, medications, health status, including review of consultants reports, laboratory and other test data, was performed as part of comprehensive evaluation and provision of chronic care management services.   SDOH:  (Social Determinants of Health) assessments and interventions performed:  SDOH Interventions   Flowsheet Row Most Recent Value  SDOH Interventions   Financial Strain Interventions Other (Comment)  [investigating patient assistance]      CCM Care Plan  Allergies  Allergen Reactions  . Sulfa Antibiotics Swelling and Other (See Comments)    Angioedema THROAT AND TONGUE HAD SWELLING  . Chantix [Varenicline] Nausea Only  . Codeine Sulfate Nausea Only  . Fenofibrate Other (See Comments)    Muscle aches  . Pseudoephedrine Hcl Palpitations  . Wellbutrin [Bupropion] Hives    Medications Reviewed Today    Reviewed by Woodward, Jessica, CMA (Certified Medical Assistant) on 11/25/20 at 0855  Med List Status: <None>  Medication Order Taking? Sig Documenting Provider Last Dose Status Informant  aspirin EC 81 MG tablet [id number] Yes Take 81 mg by mouth daily. [provider] Taking Active   celecoxib (CELEBREX) 200 MG capsule [id number] Yes Take 1 capsule (200 mg total) by mouth 2 (two) times daily. Tullo, Teresa L, MD Taking Active   cetirizine (ZYRTEC) 10 MG tablet [id number] Yes Take 10 mg by mouth daily.  [provider] Taking Active   conjugated estrogens (PREMARIN) vaginal cream [id number] Yes Place one applicatorful vaginally two to three times weekly Tullo, Teresa L, MD Taking Active            Med Note (TRAVIS, CATHERINE E   Wed Nov 25, 2020  8:19 AM) 1-2 times weekly  methocarbamol (ROBAXIN) 500 MG tablet [id number] No Take 1 tablet (500 mg total) by mouth 4 (four) times daily.  Patient not taking: No sig reported   Tullo, Teresa L, MD Not Taking  Active   Omega-3 Fatty Acids (FISH OIL) 1000 MG CAPS [id number] Yes Take 1,000 mg by mouth daily.  [provider] Taking Active Self  omeprazole (PRILOSEC) 20 MG capsule [id number] Yes TAKE ONE CAPSULE BY MOUTH TWICE DAILY BEFORE A MEAL Tullo, Teresa L, MD Taking Active   oxyCODONE (OXY IR/ROXICODONE) 5 MG immediate release tablet [id number] Yes Take 1 tablet (5 mg total) by mouth every 6 (six) hours as needed for severe pain. Tullo, Teresa L, MD Taking Active            Med Note (TRAVIS, CATHERINE E     Wed Nov 25, 2020  8:19 AM) Taking ~ once daily  Semaglutide,0.25 or 0.5MG/DOS, (OZEMPIC, 0.25 OR 0.5 MG/DOSE,) 2 MG/1.5ML SOPN [id number] Yes Inject 0.5 mg into the skin once a week. Tullo, Teresa L, MD Taking Active   simvastatin (ZOCOR) 20 MG tablet [id number] Yes TAKE ONE TABLET AT BEDTIME Tullo, Teresa L, MD Taking Active   spironolactone (ALDACTONE) 50 MG tablet [id number] Yes Take 1 tablet (50 mg total) by mouth daily. Tullo, Teresa L, MD Taking Active   traMADol (ULTRAM) 50 MG tablet [id number] Yes TAKE 1-2 TABLETS BY MOUTH EVERY 6 HOURS AS NEEDED FOR SEVERE BACK PAIN. MAX OF 6TABS PER DAY. Tullo, Teresa L, MD Taking Active   vitamin E 200 UNIT capsule [id number] Yes Take 200 Units by mouth daily. [provider] Taking Active           Patient Active Problem List   Diagnosis Date Noted  . Elevated blood-pressure reading, without diagnosis of hypertension 08/25/2020  . Encounter for Papanicolaou smear for cervical cancer screening 08/25/2020  . Advice given about COVID-19 virus infection 05/19/2019  . Status post total replacement of hip 05/02/2018  . Primary osteoarthritis of left hip 01/30/2018  . DDD (degenerative disc disease), lumbar 02/12/2017  . Snoring 02/03/2016  . Special screening for malignant neoplasms, colon   . Benign neoplasm of sigmoid colon   . Hepatic steatosis 03/09/2014  . Welcome to Medicare preventive visit 03/08/2014  . S/P abdominal supracervical  subtotal hysterectomy 03/05/2014  . Sciatica 04/29/2013  . Hyperlipidemia, unspecified 01/28/2013  . Morbid obesity (HCC) 01/25/2012  . Prediabetes 01/25/2012  . Routine general medical examination at a health care facility 01/25/2012  . Tobacco abuse 01/25/2012  . Reflux esophagitis   . Diverticulosis of intestine     Immunization History  Administered Date(s) Administered  . Influenza Split 03/31/2014, 04/23/2015  . Influenza,inj,Quad PF,6+ Mos 05/04/2018  . Influenza-Unspecified 06/19/2017, 05/11/2019, 04/11/2020  . Janssen (J&J) SARS-COV-2 Vaccination 10/24/2019, 05/15/2020  . Td 11/15/2019  . Tdap 07/01/2010    Conditions to be addressed/monitored: HLD and Prediabetes, Obesity, chronic pain  Care Plan : Medication Management  Updates made by Travis, Catherine E, RPH-CPP since 11/25/2020 12:00 AM    Problem: Obesity, Chronic Pain, Pre-diabetes,     Long-Range Goal: Disease Progression Prevention   Start Date: 11/25/2020  This Visit's Progress: On track  Priority: High  Note:   Current Barriers:  . Unable to achieve control of weight   Pharmacist Clinical Goal(s):  . Over the next 90 days, patient will achieve goal weight loss of 5-10% of baseline body weight through collaboration with PharmD and provider.   Interventions: . 1:1 collaboration with Tullo, Teresa L, MD regarding development and update of comprehensive plan of care as evidenced by provider attestation and co-signature . Inter-disciplinary care team collaboration (see longitudinal plan of care) . Comprehensive medication review performed; medication list updated in electronic medical record  SDOH: . 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 Reviewed principals of Medicare drug coverage, including deductible stage vs coverage gap.  Pre-diabetes, Obesity: . Unable to achieve weight loss goals through diet and physical activity alone; current treatment: none o Previously tried:  OTC stimulants for weight loss; hx metformin - would have symptoms of low glucose before supper . Current meal patterns: breakfast: homemade plain yogurt (low fat, 2% milk), blueberries, orange juice concentrate; lunch: sliced turkey on whole wheat bread, mayo, tomato, lettuce, pickle chips, cheese, vegetables; dinner: salmon patties, vegetables;   snacks: ice cream- low calorie fudgecicle; drinks: unsweet tea w/ splenda, 1 diet pepsi per day  . Current exercise: limited by back pain, but likes working in her garden; had joined the pool in [place] and was walking in the pool, but hasn't been back since COVID spike . Extensive discussion of GLP1, including mechanism of action, side effects. No personal or family history of medullary thyroid cancer, pancreatitis, or gallbladder disease. Script for [employer] sent to mail order pharmacy to determine cost. PA needed. Completed today on Cover My Meds . Sample provided today. Inject Ozempic 0.25 mg weekly for 4 weeks, then increase to 0.5 mg weekly. Will plan to titrate at weekly intervals as long as patient is tolerating to max tolerated dose or 2 mg weekly.  . Discussed income. Patient will check to see if they meet income cut off for [employer] patient assistance.   Hypertension: . Uncontrolled at home per patient report; current treatment: spironolactone 50 mg daily  . Current home readings: 130s/80s . Continue current regimen as above. Follow for impact of expected weight loss on BP.   Hyperlipidemia: . Controlled at goal LDL <100; current treatment: simvastatin 20 mg daily . Recommend to continue current regimen at this time  Chronic Back Pain: . Moderately well managed but not optimally controlled; current regimen: celecoxib 200 mg BID, methocarbamol 500 mg PRN, tramadol 50 mg PRN, oxycodone 5 mg PRN - notes that she will take tramadol with oxycodone. Does indicate sedation when she takes opioids.  . Notes remote hx acetaminophen, though denies  significant benefit. Notes some use of Voltaren topical gel. History of lidocaine patches when she was working. Hx duloxetine, unclear reason for discontinuation per chart review . Discussed concerns with combination of tramadol and oxycodone. Discussed increased risk of overdose as patient ages. Will follow for recommendations for improved management of chronic back/hip pain moving forward. Weight loss may provide benefit.   Allergies: Marland Kitchen Moderately well controlled per patient report; current regimen: cetirizine 10 mg daily . Continue current regimen at this time  Menopausal Symptoms: . Well controlled per patient report; current regimen: premarin vaginal cream 1-2 times weekly . Continue current regimen at this time. Investigate cost concerns/eligibility for patient assistance moving forward  GERD: . Well controlled; omeprazole 20 mg BID . Hx reflux esophagitis w/ chronic NSAID use. Appropriate to continue BID PPI at this time.   Supplements: . Omega 3 fatty acids 1 g daily, vitamin E 200 units daily   Patient Goals/Self-Care Activities . Over the next 90 days, patient will:  - take medications as prescribed collaborate with provider on medication access solutions target a minimum of 150 minutes of moderate intensity exercise weekly engage in dietary modifications by moderating portion sizes  Follow Up Plan: Telephone follow up appointment with care management team member scheduled for: ~ 6 weeks     Medication Assistance: None required.  Patient affirms current coverage meets needs.  Patient's preferred pharmacy is:  [facility] [place], [place] [address] Phone: [phone] Fax: [phone]  [place], [place] - [address] [place] [address] Phone: [phone] Fax: [phone]    Follow Up:  Patient agrees to Care Plan and  Follow-up.  Plan: Telephone follow up appointment with care management team member scheduled for:  ~ 6 weeks  Catie Darnelle Maffucci, PharmD, [place], [place] Pharmacist [employer]  at [place] [phone]     

## 2020-11-28 MED ORDER — OXYCODONE HCL 5 MG PO TABS: 5.0000 mg | ORAL_TABLET | Freq: Every evening | ORAL | 0 refills | Status: DC | PRN

## 2020-11-28 NOTE — Assessment & Plan Note (Signed)
She has no history of hypertension. Home readings have been 130/80 or less. Renal function is normal   Lab Results  Component Value Date   CREATININE 0.81 08/11/2020   Lab Results  Component Value Date   NA 139 08/11/2020   K 4.3 08/11/2020   CL 100 08/11/2020   CO2 31 08/11/2020   Lab Results  Component Value Date   MICROALBUR 1.6 08/20/2019   MICROALBUR 1.1 03/17/2017

## 2020-11-28 NOTE — Assessment & Plan Note (Signed)
Persistent despite conservative treatment.  MRI notes L4 nerve root abutting a bulging disk , but Neurosurgery has not offered definitive treatment, likely because she needs to lose weight.  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  She is now receiving disability . Recommend that she resume  attempts at  Weight loss continue celebrex  And tylenol.  Suspending tramadol given use of  10 mg of oxycodone daily for nocturnal pain. Continue cymbalta for management of depressive symptoms brought on by chronic pain  And reduced doseof  Citalopram   at 20 mg daily.  Narcotics Refill history confirmed via  Controlled Substance databas, accessed by me today..  I again have discouraged her from doing yardwork and gardening because of the strain it puts on her back In favor of starting a program of  walking regularly

## 2020-11-28 NOTE — Assessment & Plan Note (Signed)
Treated with diet given history  recurrent hypoglycemic symptoms with metformin even at the lowest dose.  She is willing to try [facility] for appetite suppression  Lab Results  Component Value Date   HGBA1C 6.1 08/11/2020

## 2020-11-28 NOTE — Assessment & Plan Note (Signed)
strongly advised her to lose weight using adherence to diet and regular exercise using a trainer .  Pharmacotherapy trial advised  With BSJGGE. Marland Kitchen

## 2020-12-09 ENCOUNTER — Other Ambulatory Visit: Payer: Self-pay | Admitting: Internal Medicine

## 2020-12-21 ENCOUNTER — Other Ambulatory Visit (INDEPENDENT_AMBULATORY_CARE_PROVIDER_SITE_OTHER): Payer: Medicare Other

## 2020-12-21 ENCOUNTER — Other Ambulatory Visit: Payer: Self-pay

## 2020-12-21 DIAGNOSIS — R7303 Prediabetes: Secondary | ICD-10-CM | POA: Diagnosis not present

## 2020-12-21 DIAGNOSIS — K760 Fatty (change of) liver, not elsewhere classified: Secondary | ICD-10-CM | POA: Diagnosis not present

## 2020-12-21 NOTE — Addendum Note (Signed)
Addended by: Leeanne Rio on: 12/21/2020 09:46 AM   Modules accepted: Orders

## 2020-12-22 LAB — COMPREHENSIVE METABOLIC PANEL
AG Ratio: 1.7 (calc) (ref 1.0–2.5)
ALT: 22 U/L (ref 6–29)
AST: 17 U/L (ref 10–35)
Albumin: 4.7 g/dL (ref 3.6–5.1)
Alkaline phosphatase (APISO): 46 U/L (ref 37–153)
BUN: 13 mg/dL (ref 7–25)
CO2: 27 mmol/L (ref 20–32)
Calcium: 10.2 mg/dL (ref 8.6–10.4)
Chloride: 106 mmol/L (ref 98–110)
Creat: 0.76 mg/dL (ref 0.50–1.05)
Globulin: 2.7 g/dL (calc) (ref 1.9–3.7)
Glucose, Bld: 100 mg/dL — ABNORMAL HIGH (ref 65–99)
Potassium: 4.6 mmol/L (ref 3.5–5.3)
Sodium: 144 mmol/L (ref 135–146)
Total Bilirubin: 0.5 mg/dL (ref 0.2–1.2)
Total Protein: 7.4 g/dL (ref 6.1–8.1)

## 2020-12-22 LAB — HEMOGLOBIN A1C
Hgb A1c MFr Bld: 5.6 % of total Hgb (ref <5.7)
Mean Plasma Glucose: 114 mg/dL
eAG (mmol/L): 6.3 mmol/L

## 2021-01-01 ENCOUNTER — Telehealth: Payer: Self-pay | Admitting: Internal Medicine

## 2021-01-01 ENCOUNTER — Encounter: Payer: Self-pay | Admitting: Internal Medicine

## 2021-01-01 ENCOUNTER — Other Ambulatory Visit: Payer: Self-pay

## 2021-01-01 ENCOUNTER — Telehealth (INDEPENDENT_AMBULATORY_CARE_PROVIDER_SITE_OTHER): Payer: Medicare Other | Admitting: Internal Medicine

## 2021-01-01 VITALS — BP 132/88 | Ht 70.0 in | Wt 282.0 lb

## 2021-01-01 DIAGNOSIS — R42 Dizziness and giddiness: Secondary | ICD-10-CM | POA: Diagnosis not present

## 2021-01-01 DIAGNOSIS — G4700 Insomnia, unspecified: Secondary | ICD-10-CM | POA: Diagnosis not present

## 2021-01-01 DIAGNOSIS — I10 Essential (primary) hypertension: Secondary | ICD-10-CM

## 2021-01-01 NOTE — Progress Notes (Deleted)
Virtual Visit via Video Note  I connected withNAME@  on 01/01/21 at  8:00 AM EDT by a video enabled telemedicine application and verified that I am speaking with the correct person using two identifiers.  Location patient: home, [place] Location provider:work or home office Persons participating in the virtual visit: patient, provider  I discussed the limitations of evaluation and management by telemedicine and the availability of in person appointments. The patient expressed understanding and agreed to proceed.   HPI:  Acute telemedicine visit for : -Onset: -Symptoms include: -Denies: -Has tried: -Pertinent past medical history: -Pertinent medication allergies: -COVID-19 vaccine status:  ROS: See pertinent positives and negatives per HPI.  Past Medical History:  Diagnosis Date   Anxiety    Arthritis    Depression    Diverticulosis of intestine 2010   iftikhar   Esophagitis, reflux 2010    EGD by iftkihar, managed wiht dexilant   GERD (gastroesophageal reflux disease)    Impaired fasting glucose 01/25/2012   Needs eye exam.      Past Surgical History:  Procedure Laterality Date   ABDOMINAL HYSTERECTOMY  2009   supracervical    APPENDECTOMY     BREAST CYST ASPIRATION Right    rt fna-neg   COLONOSCOPY WITH PROPOFOL N/A 06/05/2015   Procedure: COLONOSCOPY WITH PROPOFOL;  Surgeon: Lucilla Lame, MD;  Location: [facility];  Service: Endoscopy;  Laterality: N/A;  Pt requests early. Pt for antibiotic pre-procedure  CECAL PROTRUSION BX. / WITH CLIP LOT # [id number] RESOLUTION 360 EXP 03/03/2016 SIGMOID POLYP   JOINT REPLACEMENT Right 2012   hip, [name]   TOTAL HIP ARTHROPLASTY Left 05/02/2018   Procedure: TOTAL HIP ARTHROPLASTY;  Surgeon: Dereck Leep, MD;  Location: [facility];  Service: Orthopedics;  Laterality: Left;     Current Outpatient Medications:    aspirin EC 81 MG tablet, Take 81 mg by mouth daily., Disp: , Rfl:    celecoxib (CELEBREX) 200 MG capsule,  Take 1 capsule (200 mg total) by mouth 2 (two) times daily., Disp: 180 capsule, Rfl: 3   conjugated estrogens (PREMARIN) vaginal cream, Place one applicatorful vaginally two to three times weekly, Disp: 90 g, Rfl: 3   Insulin Pen Needle (PEN NEEDLES) 32G X 4 MM MISC, Use as directed, Disp: 100 each, Rfl: 1   methocarbamol (ROBAXIN) 500 MG tablet, Take 1 tablet (500 mg total) by mouth 4 (four) times daily., Disp: 90 tablet, Rfl: 0   Omega-3 Fatty Acids (FISH OIL) 1000 MG CAPS, Take 1,000 mg by mouth daily. , Disp: , Rfl:    omeprazole (PRILOSEC) 20 MG capsule, TAKE ONE CAPSULE BY MOUTH TWICE DAILY BEFORE A MEAL, Disp: 180 capsule, Rfl: 1   oxyCODONE (OXY IR/ROXICODONE) 5 MG immediate release tablet, Take 1 tablet (5 mg total) by mouth at bedtime and may repeat dose one time if needed., Disp: 60 tablet, Rfl: 0   oxyCODONE (ROXICODONE) 5 MG immediate release tablet, Take 1 tablet (5 mg total) by mouth 2 (two) times daily as needed for severe pain., Disp: 60 tablet, Rfl: 0   Semaglutide,0.25 or 0.5MG /DOS, (OZEMPIC, 0.25 OR 0.5 MG/DOSE,) 2 MG/1.5ML SOPN, Inject 0.5 mg into the skin once a week., Disp: 1.5 mL, Rfl: 1   simvastatin (ZOCOR) 20 MG tablet, Take 1 tablet (20 mg total) by mouth at bedtime., Disp: 90 tablet, Rfl: 3   spironolactone (ALDACTONE) 50 MG tablet, Take 1 tablet (50 mg total) by mouth daily., Disp: 90 tablet, Rfl: 3   vitamin E  200 UNIT capsule, Take 200 Units by mouth daily., Disp: , Rfl:    cetirizine (ZYRTEC) 10 MG tablet, Take 10 mg by mouth daily.  (Patient not taking: Reported on 01/01/2021), Disp: , Rfl:   EXAM:  VITALS per patient if applicable:  GENERAL: alert, oriented, appears well and in no acute distress  HEENT: atraumatic, conjunttiva clear, no obvious abnormalities on inspection of external nose and ears  NECK: normal movements of the head and neck  LUNGS: on inspection no signs of respiratory distress, breathing rate appears normal, no obvious gross SOB, gasping or  wheezing  CV: no obvious cyanosis  MS: moves all visible extremities without noticeable abnormality  PSYCH/NEURO: pleasant and cooperative, no obvious depression or anxiety, speech and thought processing grossly intact  ASSESSMENT AND PLAN:  Discussed the following assessment and plan:  No diagnosis found.  -we discussed possible serious and likely etiologies, options for evaluation and workup, limitations of telemedicine visit vs in person visit, treatment, treatment risks and precautions. Pt prefers to treat via telemedicine empirically rather than in person at this moment.  Work/School slipped offered: provided in patient instructions  ***  declined Scheduled follow up with PCP offered: Declined  ***  Sent message to schedulers to assist and advised patient to contact PCP office to schedule if does not receive call back in next 24 hours. Advised to seek prompt in person care if worsening, new symptoms arise, or if is not improving with treatment. Discussed options for inperson care if PCP office not available. Did let this patient know that I only do telemedicine on Tuesdays and Thursdays for [place]. Advised to schedule follow up visit with PCP or [facility] if any further questions or concerns to avoid delays in care.   I discussed the assessment and treatment plan with the patient. The patient was provided an opportunity to ask questions and all were answered. The patient agreed with the plan and demonstrated an understanding of the instructions.     Nino Glow McLean-Scocuzza, MD

## 2021-01-01 NOTE — Progress Notes (Signed)
Onset of symptoms around the first part of April. Patient has bad pollen allergies. Started with runny nose and watery eyes. A few weeks ago the other symptoms got better the dizziness stayed.   When laying down to go to bed and turning the head to the right she will get swimmy headed. Also got dizziness when standing too fast.   No nausea no vomiting.

## 2021-01-01 NOTE — Telephone Encounter (Signed)
Patient was worked up and waiting on the video for her 8 am appointment. Patient's video ended. Called Patient on her home and mobile phone.   Left message to return call.

## 2021-01-01 NOTE — Progress Notes (Signed)
Telephone Note  I connected with Lisa Valencia  on 01/01/21 at  8:15AM EDT by telephone and verified that I am speaking with the correct person using two identifiers.  Location patient: home, [place] Location provider:work or home office Persons participating in the virtual visit: patient, provider  I discussed the limitations of evaluation and management by telemedicine and the availability of in person appointments. The patient expressed understanding and agreed to proceed.   HPI:  Acute telemedicine visit for :  1.vertigo dizziness since 10/16/20 no h/o vertigo room spinning at night esp with turning head to the right in bed or in recliner turning head to right no nausea/vomiting. She has tried zyrtec/xyzal w/o help normally takes zyrtec not had in 2 weeks  Disc CT head pt declines FH vertigo mom and sister 2. Htn on spironolactone 50 mg qd elevated today 132/88 3. Insomnia resolved    -COVID-19 vaccine status: 1 moderna  ROS: See pertinent positives and negatives per HPI.  Past Medical History:  Diagnosis Date   Anxiety    Arthritis    Depression    Diverticulosis of intestine 2010   iftikhar   Esophagitis, reflux 2010    EGD by iftkihar, managed wiht dexilant   GERD (gastroesophageal reflux disease)    Impaired fasting glucose 01/25/2012   Needs eye exam.      Past Surgical History:  Procedure Laterality Date   ABDOMINAL HYSTERECTOMY  2009   supracervical    APPENDECTOMY     BREAST CYST ASPIRATION Right    rt fna-neg   COLONOSCOPY WITH PROPOFOL N/A 06/05/2015   Procedure: COLONOSCOPY WITH PROPOFOL;  Surgeon: Lucilla Lame, MD;  Location: [facility];  Service: Endoscopy;  Laterality: N/A;  Pt requests early. Pt for antibiotic pre-procedure  CECAL PROTRUSION BX. / WITH CLIP LOT # [id number] RESOLUTION 360 EXP 03/03/2016 SIGMOID POLYP   JOINT REPLACEMENT Right 2012   hip, [name]   TOTAL HIP ARTHROPLASTY Left 05/02/2018   Procedure: TOTAL HIP ARTHROPLASTY;   Surgeon: Dereck Leep, MD;  Location: [facility];  Service: Orthopedics;  Laterality: Left;     Current Outpatient Medications:    aspirin EC 81 MG tablet, Take 81 mg by mouth daily., Disp: , Rfl:    celecoxib (CELEBREX) 200 MG capsule, Take 1 capsule (200 mg total) by mouth 2 (two) times daily., Disp: 180 capsule, Rfl: 3   conjugated estrogens (PREMARIN) vaginal cream, Place one applicatorful vaginally two to three times weekly, Disp: 90 g, Rfl: 3   Insulin Pen Needle (PEN NEEDLES) 32G X 4 MM MISC, Use as directed, Disp: 100 each, Rfl: 1   methocarbamol (ROBAXIN) 500 MG tablet, Take 1 tablet (500 mg total) by mouth 4 (four) times daily., Disp: 90 tablet, Rfl: 0   Omega-3 Fatty Acids (FISH OIL) 1000 MG CAPS, Take 1,000 mg by mouth daily. , Disp: , Rfl:    omeprazole (PRILOSEC) 20 MG capsule, TAKE ONE CAPSULE BY MOUTH TWICE DAILY BEFORE A MEAL, Disp: 180 capsule, Rfl: 1   oxyCODONE (OXY IR/ROXICODONE) 5 MG immediate release tablet, Take 1 tablet (5 mg total) by mouth at bedtime and may repeat dose one time if needed., Disp: 60 tablet, Rfl: 0   oxyCODONE (ROXICODONE) 5 MG immediate release tablet, Take 1 tablet (5 mg total) by mouth 2 (two) times daily as needed for severe pain., Disp: 60 tablet, Rfl: 0   Semaglutide,0.25 or 0.5MG /DOS, (OZEMPIC, 0.25 OR 0.5 MG/DOSE,) 2 MG/1.5ML SOPN, Inject 0.5 mg into the skin once  a week., Disp: 1.5 mL, Rfl: 1   simvastatin (ZOCOR) 20 MG tablet, Take 1 tablet (20 mg total) by mouth at bedtime., Disp: 90 tablet, Rfl: 3   spironolactone (ALDACTONE) 50 MG tablet, Take 1 tablet (50 mg total) by mouth daily., Disp: 90 tablet, Rfl: 3   vitamin E 200 UNIT capsule, Take 200 Units by mouth daily., Disp: , Rfl:    cetirizine (ZYRTEC) 10 MG tablet, Take 10 mg by mouth daily.  (Patient not taking: Reported on 01/01/2021), Disp: , Rfl:   EXAM:  VITALS per patient if applicable:  GENERAL: alert, oriented, appears well and in no acute distress  PSYCH/NEURO: pleasant and  cooperative, no obvious depression or anxiety, speech and thought processing grossly intact  ASSESSMENT AND PLAN:  Discussed the following assessment and plan:  Vertigo Dizziness Has meclizine at home  Declines CT head  See htn a/p as well  Wants referral ENT Dr. Kathyrn Sheriff  Hypertension Spironolactone 50 mg qd  Wants to disc control with BP BP elevated 132/88 and been running this  Consider adding therapy I.e norvasc 2.5 mg qd pt declines today Monitor BP When vertigo was severe BP was 122/64  Insomnia resolved   -we discussed possible serious and likely etiologies, options for evaluation and workup, limitations of telemedicine visit vs in person visit, treatment, treatment risks and precautions. Pt prefers to treat via telemedicine empirically rather than in person at this moment.      I discussed the assessment and treatment plan with the patient. The patient was provided an opportunity to ask questions and all were answered. The patient agreed with the plan and demonstrated an understanding of the instructions.    Time 10 min.  Nino Glow McLean-Scocuzza, MD

## 2021-01-13 ENCOUNTER — Ambulatory Visit (INDEPENDENT_AMBULATORY_CARE_PROVIDER_SITE_OTHER): Payer: Medicare Other | Admitting: Pharmacist

## 2021-01-13 VITALS — Wt 280.0 lb

## 2021-01-13 DIAGNOSIS — Z0000 Encounter for general adult medical examination without abnormal findings: Secondary | ICD-10-CM

## 2021-01-13 DIAGNOSIS — I10 Essential (primary) hypertension: Secondary | ICD-10-CM

## 2021-01-13 DIAGNOSIS — R7303 Prediabetes: Secondary | ICD-10-CM

## 2021-01-13 DIAGNOSIS — E782 Mixed hyperlipidemia: Secondary | ICD-10-CM | POA: Diagnosis not present

## 2021-01-13 DIAGNOSIS — M1612 Unilateral primary osteoarthritis, left hip: Secondary | ICD-10-CM

## 2021-01-13 MED ORDER — OZEMPIC (1 MG/DOSE) 4 MG/3ML ~~LOC~~ SOPN: 1.0000 mg | PEN_INJECTOR | SUBCUTANEOUS | 0 refills | Status: AC

## 2021-01-13 MED ORDER — ZOSTER VAC RECOMB ADJUVANTED 50 MCG/0.5ML IM SUSR: INTRAMUSCULAR | 1 refills | Status: AC

## 2021-01-13 MED ORDER — SEMAGLUTIDE (2 MG/DOSE) 8 MG/3ML ~~LOC~~ SOPN: 2.0000 mg | PEN_INJECTOR | SUBCUTANEOUS | 0 refills | Status: DC

## 2021-01-13 NOTE — Chronic Care Management (AMB) (Signed)
Chronic Care Management Pharmacy Note  01/13/2021 Name:  Lisa Valencia MRN:  [id number] DOB:  03/29/1961   Subjective: Lisa Valencia is an 60 y.o. year old female who is a primary patient of Derrel Nip, Aris Everts, MD.  The CCM team was consulted for assistance with disease management and care coordination needs.    Engaged with patient by telephone for follow up visit in response to provider referral for pharmacy case management and/or care coordination services.   Consent to Services:  The patient was given information about Chronic Care Management services, agreed to services, and gave verbal consent prior to initiation of services.  Please see initial visit note for detailed documentation.   Patient Care Team: Crecencio Mc, MD as PCP - General (Internal Medicine) De Hollingshead, RPH-CPP (Pharmacist)  Recent office visits: 6/17 - McLean-Scocuzza acute for dizziness/vertigo - continue meclizine, referral to ENT   Objective:  Lab Results  Component Value Date   CREATININE 0.76 12/21/2020   CREATININE 0.81 08/11/2020   CREATININE 0.86 02/24/2020    Lab Results  Component Value Date   HGBA1C 5.6 12/21/2020   Last diabetic Eye exam: No results found for: HMDIABEYEEXA  Last diabetic Foot exam: No results found for: HMDIABFOOTEX      Component Value Date/Time   CHOL 157 08/11/2020 0816   CHOL 248 (H) 05/17/2012 0804   TRIG 206.0 (H) 08/11/2020 0816   TRIG 238 (H) 05/17/2012 0804   HDL 37.50 (L) 08/11/2020 0816   HDL 40 05/17/2012 0804   CHOLHDL 4 08/11/2020 0816   VLDL 41.2 (H) 08/11/2020 0816   VLDL 48 (H) 05/17/2012 0804   LDLCALC 160 (H) 05/17/2012 0804   LDLDIRECT 94.0 08/11/2020 0816    Hepatic Function Latest Ref Rng & Units 12/21/2020 08/11/2020 02/24/2020  Total Protein 6.1 - 8.1 g/dL 7.4 7.8 7.8  Albumin 3.5 - 5.2 g/dL - 4.7 4.7  AST 10 - 35 U/L $Remo'17 20 21  'VXROs$ ALT 6 - 29 U/L $Remo'22 30 29  'ONoAH$ Alk Phosphatase 39 - 117 U/L - 47 59  Total Bilirubin 0.2 - 1.2 mg/dL  0.5 0.5 0.4  Bilirubin, Direct 0.0 - 0.2 mg/dL - - -    Lab Results  Component Value Date/Time   TSH 0.76 02/01/2016 02:57 PM   TSH 0.50 01/26/2015 11:27 AM    CBC Latest Ref Rng & Units 07/03/2018 04/19/2018 02/01/2016  WBC 4.0 - 10.5 K/uL 4.6 7.0 6.0  Hemoglobin 12.0 - 15.0 g/dL 13.8 15.6 14.3  Hematocrit 36.0 - 46.0 % 41.9 44.3 41.8  Platelets 150.0 - 400.0 K/uL 198.0 194 187.0    No results found for: VD25OH  Clinical ASCVD: No  The 10-year ASCVD risk score Mikey Bussing DC Jr., et al., 2013) is: 19.2%   Values used to calculate the score:     Age: 79 years     Sex: Female     Is Non-Hispanic African American: No     Diabetic: Yes     Tobacco smoker: Yes     Systolic Blood Pressure: 741 mmHg     Is BP treated: Yes     HDL Cholesterol: 37.5 mg/dL     Total Cholesterol: 157 mg/dL     Social History   Tobacco Use  Smoking Status Former   Packs/day: 1.00   Years: 35.00   Pack years: 35.00   Types: Cigarettes  Smokeless Tobacco Never   BP Readings from Last 3 Encounters:  01/01/21 132/88  11/25/20 138/82  08/24/20 (!) 160/90   Pulse Readings from Last 3 Encounters:  11/25/20 83  08/24/20 91  05/11/20 95   Wt Readings from Last 3 Encounters:  01/13/21 280 lb (127 kg)  01/01/21 282 lb (127.9 kg)  11/25/20 288 lb 8 oz (130.9 kg)    Assessment: Review of patient past medical history, allergies, medications, health status, including review of consultants reports, laboratory and other test data, was performed as part of comprehensive evaluation and provision of chronic care management services.   SDOH:  (Social Determinants of Health) assessments and interventions performed:  SDOH Interventions    Flowsheet Row Most Recent Value  SDOH Interventions   Financial Strain Interventions Other (Comment)  [manufacturer assistance]       CCM Care Plan  Allergies  Allergen Reactions   Sulfa Antibiotics Swelling and Other (See Comments)    Angioedema THROAT AND TONGUE  HAD SWELLING   Chantix [Varenicline] Nausea Only   Codeine Sulfate Nausea Only   Fenofibrate Other (See Comments)    Muscle aches   Pseudoephedrine Hcl Palpitations   Wellbutrin [Bupropion] Hives    Medications Reviewed Today     Reviewed by De Hollingshead, RPH-CPP (Pharmacist) on 01/13/21 at [phone]  Med List Status: <None>   Medication Order Taking? Sig Documenting Provider Last Dose Status Informant  aspirin EC 81 MG tablet [id number] Yes Take 81 mg by mouth daily. [provider] Taking Active   celecoxib (CELEBREX) 200 MG capsule [id number] Yes Take 1 capsule (200 mg total) by mouth 2 (two) times daily. Crecencio Mc, MD Taking Active   cetirizine (ZYRTEC) 10 MG tablet [id number]  Take 10 mg by mouth daily.   Patient not taking: Reported on 01/01/2021   [provider]  Active   conjugated estrogens (PREMARIN) vaginal cream [id number] Yes Place one applicatorful vaginally two to three times weekly Crecencio Mc, MD Taking Active            Med Note Kelby Aline Nov 25, 2020  8:19 AM) 1-2 times weekly  Insulin Pen Needle (PEN NEEDLES) 32G X 4 MM MISC [id number]  Use as directed Crecencio Mc, MD  Active   methocarbamol (ROBAXIN) 500 MG tablet [id number] Yes Take 1 tablet (500 mg total) by mouth 4 (four) times daily. Crecencio Mc, MD Taking Active   Omega-3 Fatty Acids (FISH OIL) 1000 MG CAPS [id number] Yes Take 1,000 mg by mouth daily.  [provider] Taking Active Self  omeprazole (PRILOSEC) 20 MG capsule [id number] Yes TAKE ONE CAPSULE BY MOUTH TWICE DAILY BEFORE A MEAL Crecencio Mc, MD Taking Active   oxyCODONE (OXY IR/ROXICODONE) 5 MG immediate release tablet [id number]  Take 1 tablet (5 mg total) by mouth at bedtime and may repeat dose one time if needed. Crecencio Mc, MD  Active   oxyCODONE (ROXICODONE) 5 MG immediate release tablet [id number]  Take 1 tablet (5 mg total) by mouth 2 (two) times daily as needed for severe pain. Crecencio Mc, MD  Active   Semaglutide,0.25 or 0.5MG /DOS, (OZEMPIC, 0.25 OR 0.5 MG/DOSE,) 2 MG/1.5ML SOPN [id number] Yes Inject 0.5 mg into the skin once a week. Crecencio Mc, MD Taking Active   simvastatin (ZOCOR) 20 MG tablet [id number] Yes Take 1 tablet (20 mg total) by mouth at bedtime. Crecencio Mc, MD Taking Active   spironolactone (ALDACTONE) 50 MG tablet [id number] Yes Take 1 tablet (50 mg total) by mouth daily. Crecencio Mc, MD  Taking Active   vitamin E 200 UNIT capsule [id number] Yes Take 200 Units by mouth daily. [provider] Taking Active             Patient Active Problem List   Diagnosis Date Noted   Vertigo 01/01/2021   Elevated blood-pressure reading, without diagnosis of hypertension 08/25/2020   Encounter for Papanicolaou smear for cervical cancer screening 08/25/2020   Status post total replacement of hip 05/02/2018   Primary osteoarthritis of left hip 01/30/2018   DDD (degenerative disc disease), lumbar 02/12/2017   Snoring 02/03/2016   Special screening for malignant neoplasms, colon    Benign neoplasm of sigmoid colon    Hepatic steatosis 03/09/2014   Welcome to Medicare preventive visit 03/08/2014   S/P abdominal supracervical subtotal hysterectomy 03/05/2014   Sciatica 04/29/2013   Hyperlipidemia, unspecified 01/28/2013   Morbid obesity ([place]) 01/25/2012   Prediabetes 01/25/2012   Routine general medical examination at a health care facility 01/25/2012   Tobacco abuse 01/25/2012   Reflux esophagitis    Diverticulosis of intestine     Immunization History  Administered Date(s) Administered   Influenza Split 03/31/2014, 04/23/2015   Influenza,inj,Quad PF,6+ Mos 05/04/2018   Influenza-Unspecified 06/19/2017, 05/11/2019, 04/11/2020   Janssen (J&J) SARS-COV-2 Vaccination 10/24/2019, 05/15/2020   Moderna SARS-COV2 Booster Vaccination 12/25/2020   Td 11/15/2019   Tdap 07/01/2010    Conditions to be addressed/monitored: HTN, HLD, and chronic  pain, obesity  Care Plan : Medication Management  Updates made by De Hollingshead, RPH-CPP since 01/13/2021 12:00 AM     Problem: Obesity, Chronic Pain, Pre-diabetes,      Long-Range Goal: Disease Progression Prevention   Start Date: 11/25/2020  This Visit's Progress: On track  Recent Progress: On track  Priority: High  Note:   Current Barriers:  Unable to achieve control of weight   Pharmacist Clinical Goal(s):  Over the next 90 days, patient will achieve goal weight loss of 5-10% of baseline body weight through collaboration with PharmD and provider.   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 Discussed, patient amenable. Script sent to local pharmacy in case the pharmacy would like to have on file.  Due for pneumonia. Discuss w/ PCP at upcoming appointment  ?Vertigo: Reports occasional continued episodes, has an appointment with ENT in July. Has meclizine at home, but prefers not to use as she does not like the groggy next day sedation.   Pre-diabetes, Obesity: Unable to achieve weight loss goals through diet and physical activity alone; current treatment: Ozempic 0.5 mg (x 3 weeks, tomorrow is 4th week)  Denies any GI upset (nausea, vomiting, constipation, diarrhea); Does indicate the decreased appetite.  Most recent weight: 280; baseline weight: 282 lbs  Previously tried: OTC stimulants for weight loss; hx metformin - would have symptoms of low glucose before supper Current meal patterns: breakfast: homemade plain yogurt (low fat, 2% milk), blueberries, orange  juice concentrate; lunch: sliced [place] on whole wheat bread, mayo, tomato, lettuce, pickle chips, cheese, vegetables; dinner: salmon patties, vegetables; snacks: ice cream- low calorie fudgecicle; drinks: unsweet tea w/ splenda, 1 diet pepsi per day  Current exercise: limited by back pain, but likes working in  her garden; had joined the pool in [place] and was walking in the pool, but hasn't been back since COVID spike Continue plan to complete 4 weeks of Ozempic 0.5 mg weekly, then increase to 1 mg weekly for 4 weeks, then increase to 2 mg weekly thereafter, as tolerated. Discussed patient assistance. Will start the process for Ozempic patient assistance today, will collaborate w/ CPhT to mail patient her portion of the application and will collaborate w/ PCP for prescription portion. Patient aware we will need proof of current income (received disability back payment last year so 2021 tax return is not accurate for 2022 income)  Hypertension: Uncontrolled at home per patient report, declined addition of amlodipine at recent visit w/ Dr. Faythe Casa; current treatment: spironolactone 50 mg daily  Previously recommended to continue current regimen as above. Follow for impact of expected weight loss on BP.   Hyperlipidemia: Controlled at goal LDL <100; current treatment: simvastatin 20 mg daily Previously recommended to continue current regimen at this time  Chronic Back Pain: Moderately well managed but not optimally controlled; current regimen: celecoxib 200 mg BID, methocarbamol 500 mg PRN, tramadol 50 mg PRN, oxycodone 5 mg PRN.  Hx acetaminophen without benefit. Hx lidocaine patches, Voltaren gel, duloxetine Previously discussed increased risk of overdose as patient ages. Will follow for recommendations for improved management of chronic back/hip pain moving forward. Weight loss may provide benefit.   Allergies: Moderately well controlled per patient report; current regimen: cetirizine 10 mg  daily PRN Previously recommended to continue current regimen at this time  Menopausal Symptoms: Well controlled per patient report; current regimen: premarin vaginal cream 1-2 times weekly Previously recommended to continue current regimen at this time. Investigate cost concerns/eligibility for patient assistance moving forward  GERD: Well controlled; omeprazole 20 mg BID Hx reflux esophagitis w/ chronic NSAID use. Appropriate to continue BID PPI at this time.   Supplements: Omega 3 fatty acids 1 g daily, vitamin E 200 units daily   Patient Goals/Self-Care Activities Over the next 90 days, patient will:  - take medications as prescribed collaborate with provider on medication access solutions target a minimum of 150 minutes of moderate intensity exercise weekly engage in dietary modifications by moderating portion sizes  Follow Up Plan: Telephone follow up appointment with care management team member scheduled for: ~ 4 months     Medication Assistance: Application for Ozempic  medication assistance program. in process.  Anticipated assistance start date TBD.  See plan of care for additional detail.  Patient's preferred pharmacy is:  [facility] [place], [place] [address] Phone: [phone] Fax: [phone]  [place], [place] [address] [place] [address] Phone: [phone] Fax: [phone]    Follow Up:  Patient agrees to Care Plan and Follow-up.  Plan: Telephone follow up appointment with care management team member scheduled for:  ~ 4 months  Catie Darnelle Maffucci, PharmD, [place], [place] Clinical Pharmacist [employer] at [employer] [phone]

## 2021-01-13 NOTE — Patient Instructions (Signed)
Visit Information  PATIENT GOALS:  Goals Addressed               This Visit's Progress     Patient Stated     Self-Management (pt-stated)        Patient Goals/Self-Care Activities Over the next 90 days, patient will:  - take medications as prescribed collaborate with provider on medication access solutions target a minimum of 150 minutes of moderate intensity exercise weekly engage in dietary modifications by moderating portion sizes           Patient verbalizes understanding of instructions provided today and agrees to view in [place].    Plan: Telephone follow up appointment with care management team member scheduled for:  ~ 4 months  Catie Darnelle Maffucci, PharmD, [place], [place] Clinical Pharmacist [employer] at [employer] [phone]

## 2021-01-14 ENCOUNTER — Telehealth: Payer: Self-pay | Admitting: Pharmacy Technician

## 2021-01-14 DIAGNOSIS — Z596 Low income: Secondary | ICD-10-CM

## 2021-01-14 NOTE — Progress Notes (Signed)
[facility])                                            [place] Team    01/14/2021  NEYDA DURANGO [birth year] [id number]                                      Medication Assistance Referral  Referral From: [place] RPh Catie T.   Medication/Company: Larna Daughters / Novo Nordisk Patient application portion:  Education officer, museum portion: Faxed  to Dr. Deborra Medina Provider address/fax verified via: Office website    N[place]ota Elsen P. Kimberlee Shoun, [place]  [phone]

## 2021-01-26 ENCOUNTER — Telehealth: Payer: Self-pay | Admitting: Pharmacy Technician

## 2021-01-26 DIAGNOSIS — Z596 Low income: Secondary | ICD-10-CM

## 2021-01-26 DIAGNOSIS — H8111 Benign paroxysmal vertigo, right ear: Secondary | ICD-10-CM | POA: Diagnosis not present

## 2021-01-26 NOTE — Progress Notes (Signed)
[facility])                                            [place] Team    01/26/2021  INITA URAM April 04, 1961 [id number]  Received both patient and provider portion(s) of patient assistance application(s) for Ozempic. Faxed completed application and required documents into [employer].    Keyara Ent P. Ordell Prichett, [place]  [phone]

## 2021-02-01 ENCOUNTER — Telehealth: Payer: Self-pay | Admitting: Pharmacy Technician

## 2021-02-01 NOTE — Progress Notes (Signed)
[facility])                                            [place] Team    02/01/2021  ERICHA WHITTINGHAM [birth year] [id number]  Care coordination call placed to [place] in regards to [place] application.  Spoke to [place] who informed patient was APPROVED 01/27/21-07/17/21. She informed patient should receive a 120 days supply delivered to the provider's office in the next 10-14 business days.  Prestyn Mahn P. Makinzey Banes, [place]  [phone]

## 2021-02-03 DIAGNOSIS — H8111 Benign paroxysmal vertigo, right ear: Secondary | ICD-10-CM | POA: Diagnosis not present

## 2021-02-10 ENCOUNTER — Telehealth: Payer: Self-pay

## 2021-02-10 NOTE — Telephone Encounter (Signed)
Spoke with pt to let her know that her patient assistance medication has arrived and is ready to be picked up. Pt stated that she will pick it up on Friday when she goes for a  doctors appt.   Ozempic: 4 boxes

## 2021-02-12 DIAGNOSIS — R42 Dizziness and giddiness: Secondary | ICD-10-CM | POA: Diagnosis not present

## 2021-03-01 ENCOUNTER — Other Ambulatory Visit: Payer: Self-pay | Admitting: Internal Medicine

## 2021-03-01 ENCOUNTER — Ambulatory Visit: Payer: Medicare Other | Admitting: Internal Medicine

## 2021-03-02 ENCOUNTER — Ambulatory Visit: Payer: Medicare Other | Admitting: Internal Medicine

## 2021-03-04 ENCOUNTER — Other Ambulatory Visit: Payer: Self-pay

## 2021-03-04 ENCOUNTER — Ambulatory Visit (INDEPENDENT_AMBULATORY_CARE_PROVIDER_SITE_OTHER): Payer: Medicare Other | Admitting: Internal Medicine

## 2021-03-04 ENCOUNTER — Encounter: Payer: Self-pay | Admitting: Internal Medicine

## 2021-03-04 DIAGNOSIS — M5431 Sciatica, right side: Secondary | ICD-10-CM | POA: Diagnosis not present

## 2021-03-04 NOTE — Patient Instructions (Signed)
To wean off oxycodone:  Reduce dose to 1/2 tablet every 12 hours for one week  Then 1/2 tablet once daily ofr one week,   Then stop  Ok to reduce ozempic to 1 mg dose and continue that dose as long as you are losing weight  Remember yardwork is NOT Exeric[place].  It does not burn calories in a predictable way  30 minutes of continuous movement (walking,  bicycling,  ellipitical)  is needed 5 days per week    Ok to use beano at every meal with greens ,  ok to use gas x or phasyme as needed AFTER meals

## 2021-03-04 NOTE — Progress Notes (Signed)
Subjective:  Patient ID: Lisa Valencia, female    DOB: 09-Nov-1960  Age: 60 y.o. MRN: [id number]  CC: Diagnoses of Morbid obesity ([place]) and Sciatica of right side were pertinent to this visit.  HPI Lisa Valencia presents for FOLLOW UP ON CHRONIC PAIN,  OBESITY AND FATTY LIVER  This visit occurred during the SARS-CoV-2 public health emergency.  Safety protocols were in place, including screening questions prior to the visit, additional usage of staff PPE, and extensive cleaning of exam room while observing appropriate contact time as indicated for disinfecting solutions.    1) Taking Ozempic,  has recently increased dose to 2 mg daily but was still losing weight on 1 mg .  Now with side effects of intermittent abd pain,  gas and bloating . Has no appetite for 3-4 days after first 2 mg dose,  2nd dose caused anorexia for 2-3 days and increased gas.    Not exercising.   Does some yard work.  Has lost 13 lbs since May.     2) Chronic pain :  not using tramadol since last visit.  Using 2 Oxycodone daily,  wants to get off it. And get back on tramadol   Disucssed taper. Outpatient Medications Prior to Visit  Medication Sig Dispense Refill   aspirin EC 81 MG tablet Take 81 mg by mouth daily.     celecoxib (CELEBREX) 200 MG capsule Take 1 capsule (200 mg total) by mouth 2 (two) times daily. 180 capsule 3   cetirizine (ZYRTEC) 10 MG tablet Take 10 mg by mouth daily.     conjugated estrogens (PREMARIN) vaginal cream Place one applicatorful vaginally two to three times weekly 90 g 3   Insulin Pen Needle (PEN NEEDLES) 32G X 4 MM MISC Use as directed 100 each 1   methocarbamol (ROBAXIN) 500 MG tablet Take 1 tablet (500 mg total) by mouth 4 (four) times daily. 90 tablet 0   Omega-3 Fatty Acids (FISH OIL) 1000 MG CAPS Take 1,000 mg by mouth daily.      omeprazole (PRILOSEC) 20 MG capsule TAKE ONE CAPSULE BY MOUTH TWICE DAILY BEFORE A MEAL 180 capsule 1   oxyCODONE (OXY IR/ROXICODONE) 5 MG immediate  release tablet Take 1 tablet (5 mg total) by mouth at bedtime and may repeat dose one time if needed. 60 tablet 0   oxyCODONE (OXY IR/ROXICODONE) 5 MG immediate release tablet TAKE 1 TABLET BY MOUTH 2 TIMES DAILY AS NEEDED FOR SEVERE PAIN. 60 tablet 0   Semaglutide, 2 MG/DOSE, 8 MG/3ML SOPN Inject 2 mg into the skin once a week. After finishing 1 mg dose, increase to 2 mg weekly 3 mL 0   simvastatin (ZOCOR) 20 MG tablet Take 1 tablet (20 mg total) by mouth at bedtime. 90 tablet 3   spironolactone (ALDACTONE) 50 MG tablet Take 1 tablet (50 mg total) by mouth daily. 90 tablet 3   vitamin E 200 UNIT capsule Take 200 Units by mouth daily.     No facility-administered medications prior to visit.    Review of Systems;  Patient denies headache, fevers, malaise, unintentional weight loss, skin rash, eye pain, sinus congestion and sinus pain, sore throat, dysphagia,  hemoptysis , cough, dyspnea, wheezing, chest pain, palpitations, orthopnea, edema, abdominal pain, nausea, melena, diarrhea, constipation, flank pain, dysuria, hematuria, urinary  Frequency, nocturia, numbness, tingling, seizures,  Focal weakness, Loss of consciousness,  Tremor, insomnia, depression, anxiety, and suicidal ideation.      Objective:  BP 120/82 (BP  Location: Left Arm, Patient Position: Sitting, Cuff Size: Large)   Pulse 99   Temp (!) 95.4 F (35.2 C) (Temporal)   Resp 15   Ht '5\' 10"'$  (1.778 m)   Wt 275 lb 9.6 oz (125 kg)   SpO2 99%   BMI 39.54 kg/m   BP Readings from Last 3 Encounters:  03/04/21 120/82  01/01/21 132/88  11/25/20 138/82    Wt Readings from Last 3 Encounters:  03/04/21 275 lb 9.6 oz (125 kg)  01/13/21 280 lb (127 kg)  01/01/21 282 lb (127.9 kg)    General appearance: alert, cooperative and appears stated age Ears: normal TM's and external ear canals both ears Throat: lips, mucosa, and tongue normal; teeth and gums normal Neck: no adenopathy, no carotid bruit, supple, symmetrical, trachea  midline and thyroid not enlarged, symmetric, no tenderness/mass/nodules Back: symmetric, no curvature. ROM normal. No CVA tenderness. Lungs: clear to auscultation bilaterally Heart: regular rate and rhythm, S1, S2 normal, no murmur, click, rub or gallop Abdomen: soft, non-tender; bowel sounds normal; no masses,  no organomegaly Pulses: 2+ and symmetric Skin: Skin color, texture, turgor normal. No rashes or lesions Lymph nodes: Cervical, supraclavicular, and axillary nodes normal.  Lab Results  Component Value Date   HGBA1C 5.6 12/21/2020   HGBA1C 6.1 08/11/2020   HGBA1C 6.0 08/20/2019    Lab Results  Component Value Date   CREATININE 0.76 12/21/2020   CREATININE 0.81 08/11/2020   CREATININE 0.86 02/24/2020    Lab Results  Component Value Date   WBC 4.6 07/03/2018   HGB 13.8 07/03/2018   HCT 41.9 07/03/2018   PLT 198.0 07/03/2018   GLUCOSE 100 (H) 12/21/2020   CHOL 157 08/11/2020   TRIG 206.0 (H) 08/11/2020   HDL 37.50 (L) 08/11/2020   LDLDIRECT 94.0 08/11/2020   LDLCALC 160 (H) 05/17/2012   ALT 22 12/21/2020   AST 17 12/21/2020   NA 144 12/21/2020   K 4.6 12/21/2020   CL 106 12/21/2020   CREATININE 0.76 12/21/2020   BUN 13 12/21/2020   CO2 27 12/21/2020   TSH 0.76 02/01/2016   INR 0.90 04/19/2018   HGBA1C 5.6 12/21/2020   MICROALBUR 1.6 08/20/2019    MM 3D SCREEN BREAST BILATERAL  Result Date: 09/13/2020 CLINICAL DATA:  Screening. EXAM: DIGITAL SCREENING BILATERAL MAMMOGRAM WITH TOMOSYNTHESIS AND CAD TECHNIQUE: Bilateral screening digital craniocaudal and mediolateral oblique mammograms were obtained. Bilateral screening digital breast tomosynthesis was performed. The images were evaluated with computer-aided detection. COMPARISON:  Previous exam(s). ACR Breast Density Category a: The breast tissue is almost entirely fatty. FINDINGS: There are no findings suspicious for malignancy. IMPRESSION: No mammographic evidence of malignancy. A result letter of this screening  mammogram will be mailed directly to the patient. RECOMMENDATION: Screening mammogram in one year. (Code:SM-B-01Y) BI-RADS CATEGORY  1: Negative. Electronically Signed   By: Lillia Mountain M.D.   On: 09/13/2020 11:27    Assessment & Plan:   Problem List Items Addressed This Visit       Unprioritized   Morbid obesity ([place])    strongly advised her to resume exercise on a regular basis .  Continue appetite suppression with Ozempic      Sciatica    Persistent despite conservative treatment.  MRI notes L4 nerve root abutting a bulging disk , but Neurosurgery has not offered definitive treatment. 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  She is now unemployed and uninsured.  Continued attempts at  Weight loss strongly advised,  NSAIDs trial not helpful  .  Weaning oxycodone over the next two weeks.  Resume use of  tramadol regularly         I spent 30 minutes dedicated to the care of this patient on the date of this encounter to include pre-visit review of his medical history,  Face-to-face time with the patient , and post visit ordering of testing and therapeutics.   There are no discontinued medications.  Follow-up: Return in about 4 months (around 07/04/2021).   Crecencio Mc, MD

## 2021-03-07 NOTE — Assessment & Plan Note (Signed)
Persistent despite conservative treatment.  MRI notes L4 nerve root abutting a bulging disk , but Neurosurgery has not offered definitive treatment. 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  She is now unemployed and uninsured.  Continued attempts at  Weight loss strongly advised,  NSAIDs trial not helpful  .  Weaning oxycodone over the next two weeks.  Resume use of  tramadol regularly

## 2021-03-07 NOTE — Assessment & Plan Note (Signed)
strongly advised her to resume exercise on a regular basis .  Continue appetite suppression with Ozempic

## 2021-04-01 ENCOUNTER — Ambulatory Visit (INDEPENDENT_AMBULATORY_CARE_PROVIDER_SITE_OTHER): Payer: Medicare Other | Admitting: Pharmacist

## 2021-04-01 DIAGNOSIS — M1612 Unilateral primary osteoarthritis, left hip: Secondary | ICD-10-CM

## 2021-04-01 DIAGNOSIS — M5431 Sciatica, right side: Secondary | ICD-10-CM

## 2021-04-01 DIAGNOSIS — K760 Fatty (change of) liver, not elsewhere classified: Secondary | ICD-10-CM

## 2021-04-01 DIAGNOSIS — E782 Mixed hyperlipidemia: Secondary | ICD-10-CM

## 2021-04-01 DIAGNOSIS — R7303 Prediabetes: Secondary | ICD-10-CM

## 2021-04-01 NOTE — Chronic Care Management (AMB) (Signed)
Chronic Care Management Pharmacy Note  04/01/2021 Name:  Lisa Valencia MRN:  [id number] DOB:  1960/08/08   Subjective: Lisa Valencia is an 60 y.o. year old female who is a primary patient of Derrel Nip, Aris Everts, MD.  The CCM team was consulted for assistance with disease management and care coordination needs.    Engaged with patient by telephone for follow up visit in response to provider referral for pharmacy case management and/or care coordination services.   Consent to Services:  The patient was given information about Chronic Care Management services, agreed to services, and gave verbal consent prior to initiation of services.  Please see initial visit note for detailed documentation.   Patient Care Team: Crecencio Mc, MD as PCP - General (Internal Medicine) De Hollingshead, RPH-CPP (Pharmacist)  Objective:  Lab Results  Component Value Date   CREATININE 0.76 12/21/2020   CREATININE 0.81 08/11/2020   CREATININE 0.86 02/24/2020    Lab Results  Component Value Date   HGBA1C 5.6 12/21/2020   Last diabetic Eye exam: No results found for: HMDIABEYEEXA  Last diabetic Foot exam: No results found for: HMDIABFOOTEX      Component Value Date/Time   CHOL 157 08/11/2020 0816   CHOL 248 (H) 05/17/2012 0804   TRIG 206.0 (H) 08/11/2020 0816   TRIG 238 (H) 05/17/2012 0804   HDL 37.50 (L) 08/11/2020 0816   HDL 40 05/17/2012 0804   CHOLHDL 4 08/11/2020 0816   VLDL 41.2 (H) 08/11/2020 0816   VLDL 48 (H) 05/17/2012 0804   LDLCALC 160 (H) 05/17/2012 0804   LDLDIRECT 94.0 08/11/2020 0816    Hepatic Function Latest Ref Rng & Units 12/21/2020 08/11/2020 02/24/2020  Total Protein 6.1 - 8.1 g/dL 7.4 7.8 7.8  Albumin 3.5 - 5.2 g/dL - 4.7 4.7  AST 10 - 35 U/L _0 ALT 6 - 29 U/L _1 Alk Phosphatase 39 - 117 U/L - 47 59  Total Bilirubin 0.2 - 1.2 mg/dL 0.5 0.5 0.4  Bilirubin, Direct 0.0 - 0.2 mg/dL - - -    Lab Results  Component Value Date/Time   TSH 0.76  02/01/2016 02:57 PM   TSH 0.50 01/26/2015 11:27 AM    CBC Latest Ref Rng & Units 07/03/2018 04/19/2018 02/01/2016  WBC 4.0 - 10.5 K/uL 4.6 7.0 6.0  Hemoglobin 12.0 - 15.0 g/dL 13.8 15.6 14.3  Hematocrit 36.0 - 46.0 % 41.9 44.3 41.8  Platelets 150.0 - 400.0 K/uL 198.0 194 187.0    No results found for: VD25OH  Clinical ASCVD: No  The 10-year ASCVD risk score (Arnett DK, et al., 2019) is: 16.2%   Values used to calculate the score:     Age: 34 years     Sex: Female     Is Non-Hispanic African American: No     Diabetic: Yes     Tobacco smoker: Yes     Systolic Blood Pressure: 591 mmHg     Is BP treated: Yes     HDL Cholesterol: 37.5 mg/dL     Total Cholesterol: 157 mg/dL      Social History   Tobacco Use  Smoking Status Former   Packs/day: 1.00   Years: 35.00   Pack years: 35.00   Types: Cigarettes  Smokeless Tobacco Never   BP Readings from Last 3 Encounters:  03/04/21 120/82  01/01/21 132/88  11/25/20 138/82   Pulse Readings from Last 3 Encounters:  03/04/21 99  11/25/20 83  08/24/20 91   Wt Readings from Last 3 Encounters:  03/04/21 275 lb 9.6 oz (125 kg)  01/13/21 280 lb (127 kg)  01/01/21 282 lb (127.9 kg)    Assessment: Review of patient past medical history, allergies, medications, health status, including review of consultants reports, laboratory and other test data, was performed as part of comprehensive evaluation and provision of chronic care management services.   SDOH:  (Social Determinants of Health) assessments and interventions performed:  SDOH Interventions    Flowsheet Row Most Recent Value  SDOH Interventions   Financial Strain Interventions Other (Comment)  [manufacturer assistance]       CCM Care Plan  Allergies  Allergen Reactions   Sulfa Antibiotics Swelling and Other (See Comments)    Angioedema THROAT AND TONGUE HAD SWELLING   Chantix [Varenicline] Nausea Only   Codeine Sulfate Nausea Only   Fenofibrate Other (See  Comments)    Muscle aches   Pseudoephedrine Hcl Palpitations   Wellbutrin [Bupropion] Hives    Medications Reviewed Today     Reviewed by De Hollingshead, RPH-CPP (Pharmacist) on 04/01/21 at 0910  Med List Status: <None>   Medication Order Taking? Sig Documenting Provider Last Dose Status Informant  aspirin EC 81 MG tablet [id number] Yes Take 81 mg by mouth daily. [provider] Taking Active   celecoxib (CELEBREX) 200 MG capsule [id number] Yes Take 1 capsule (200 mg total) by mouth 2 (two) times daily. Crecencio Mc, MD Taking Active   cetirizine (ZYRTEC) 10 MG tablet [id number] Yes Take 10 mg by mouth daily. [provider] Taking Active   conjugated estrogens (PREMARIN) vaginal cream [id number] Yes Place one applicatorful vaginally two to three times weekly Crecencio Mc, MD Taking Active            Med Note Kelby Aline Nov 25, 2020  8:19 AM) 1-2 times weekly  Insulin Pen Needle (PEN NEEDLES) 32G X 4 MM MISC [id number]  Use as directed Crecencio Mc, MD  Active   methocarbamol (ROBAXIN) 500 MG tablet [id number] No Take 1 tablet (500 mg total) by mouth 4 (four) times daily.  Patient not taking: Reported on 04/01/2021   Crecencio Mc, MD Not Taking Active   Omega-3 Fatty Acids (FISH OIL) 1000 MG CAPS [id number] Yes Take 1,000 mg by mouth daily.  [provider] Taking Active Self  omeprazole (PRILOSEC) 20 MG capsule [id number] Yes TAKE ONE CAPSULE BY MOUTH TWICE DAILY BEFORE A MEAL Crecencio Mc, MD Taking Active   oxyCODONE (OXY IR/ROXICODONE) 5 MG immediate release tablet [id number] No Take 1 tablet (5 mg total) by mouth at bedtime and may repeat dose one time if needed.  Patient not taking: Reported on 04/01/2021   Crecencio Mc, MD Not Taking Active   oxyCODONE (OXY IR/ROXICODONE) 5 MG immediate release tablet [id number] No TAKE 1 TABLET BY MOUTH 2 TIMES DAILY AS NEEDED FOR SEVERE PAIN.  Patient not taking: Reported on 04/01/2021   Crecencio Mc, MD Not Taking Active   Semaglutide, 2 MG/DOSE, 8 MG/3ML SOPN [id number] Yes Inject 2 mg into the skin once a week. After finishing 1 mg dose, increase to 2 mg weekly Crecencio Mc, MD Taking Active            Med Note Nat Christen Apr 01, 2021  8:59 AM) Injecting 1 mg (37 clicks)  simvastatin (ZOCOR) 20 MG tablet [id number] Yes Take 1 tablet (20 mg  total) by mouth at bedtime. Crecencio Mc, MD Taking Active   spironolactone (ALDACTONE) 50 MG tablet [id number] No Take 1 tablet (50 mg total) by mouth daily.  Patient not taking: Reported on 04/01/2021   Crecencio Mc, MD Not Taking Active            Med Note Nat Christen Apr 01, 2021  9:08 AM)    vitamin E 200 UNIT capsule [id number] Yes Take 200 Units by mouth daily. [provider] Taking Active             Patient Active Problem List   Diagnosis Date Noted   Vertigo 01/01/2021   Elevated blood-pressure reading, without diagnosis of hypertension 08/25/2020   Encounter for Papanicolaou smear for cervical cancer screening 08/25/2020   Status post total replacement of hip 05/02/2018   Primary osteoarthritis of left hip 01/30/2018   DDD (degenerative disc disease), lumbar 02/12/2017   Snoring 02/03/2016   Special screening for malignant neoplasms, colon    Benign neoplasm of sigmoid colon    Hepatic steatosis 03/09/2014   Welcome to Medicare preventive visit 03/08/2014   S/P abdominal supracervical subtotal hysterectomy 03/05/2014   Sciatica 04/29/2013   Hyperlipidemia, unspecified 01/28/2013   Morbid obesity ([place]) 01/25/2012   Prediabetes 01/25/2012   Routine general medical examination at a health care facility 01/25/2012   Tobacco abuse 01/25/2012   Reflux esophagitis    Diverticulosis of intestine     Immunization History  Administered Date(s) Administered   Influenza Split 03/31/2014, 04/23/2015   Influenza,inj,Quad PF,6+ Mos 05/04/2018   Influenza-Unspecified 06/19/2017,  05/11/2019, 04/11/2020   Janssen (J&J) SARS-COV-2 Vaccination 10/24/2019, 05/15/2020   Moderna SARS-COV2 Booster Vaccination 12/25/2020   Td 11/15/2019   Tdap 07/01/2010    Conditions to be addressed/monitored: Obesity, pre diabetes, chronic pain  Care Plan : Medication Management  Updates made by De Hollingshead, RPH-CPP since 04/01/2021 12:00 AM     Problem: Obesity, Chronic Pain, Pre-diabetes,      Long-Range Goal: Disease Progression Prevention   Start Date: 11/25/2020  This Visit's Progress: On track  Recent Progress: On track  Priority: High  Note:   Current Barriers:  Unable to achieve control of weight   Pharmacist Clinical Goal(s):  Over the next 90 days, patient will achieve goal weight loss of 5-10% of baseline body weight through collaboration with PharmD and provider.   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  Health Maintenance Yearly diabetic eye exam: up to date Yearly diabetic foot exam: up to date Urine microalbumin: due - recommend with next set of lab work Yearly influenza vaccination: due - recommend, encouraged to pursue at local pharmacy Td/Tdap vaccination: up to date Pneumonia vaccination: due - recommend to discuss w/ PCP at next appointment COVID vaccinations: due - recommended to pursue bivalent booster at local pharmacy Shingrix vaccinations: due - recommended to pursue at local pharmacy Colonoscopy: up to date  Pre-diabetes, Obesity: Unable to achieve weight loss goals through diet and physical activity alone; current treatment: Ozempic 1 mg  Denies any GI upset  (nausea, vomiting, constipation, diarrhea); Notes that she has not had any weight loss since decreasing back to 1 mg after  last PCP visit. Reports increase in hunger since decreasing dose.  Most recent weight: 282 lbs; baseline weight: 275 lbs Previously tried: OTC stimulants for weight loss; hx metformin - would have symptoms of low glucose before supper Current meal patterns: breakfast: homemade plain yogurt (low fat, 2% milk), blueberries, orange juice concentrate; snack: string cheese; lunch: hamburger, sometimes bun but generally avoids as husband is gluten intolerant; shrimp; dinner: salmon patties, vegetables; snacks: Atkins bars, ice cream- low calorie fudgecicle; drinks: unsweet tea w/ splenda, 1 diet pepsi per day  Current exercise: limited by back pain, but likes working in her garden; had joined the pool in [place] and was walking in the pool, but hasn't been back since COVID spike Recommend to increase back to Ozempic 2 mg weekly. Advised on potential for increased GI related side effects. Patient verbalized understanding.  Extensive dietary discussion. Discussed increased focus on protein and fiber in diet to help with satiety.   Lower Extremity Edema/Blood Pressure: Controlled at last office visit; current treatment: spironolactone 50 mg daily- though taking PRN for LEE Previously recommended to continue current regimen as above. Follow for impact of expected weight loss on BP.   Hyperlipidemia: Controlled at goal LDL <100; current treatment: simvastatin 20 mg daily Previously recommended to continue current regimen at this time  Chronic Back Pain: Worsened; current regimen: celecoxib 200 mg BID, methocarbamol 500 mg PRN, oxycodone 5 mg PRN- reports that since she weaned off oxycodone (as she requested) and has been struggling with chronic back pain Hx acetaminophen without benefit. Hx lidocaine patches, Voltaren gel, duloxetine - reports that she had been on duloxetine mostly for  antidepressant effect and came off due to wanting to stop antidepressants  Discussed use of gabapentin vs duloxetine (or combination moving forward) for chronic back pain. Patient interested in trying one of these options. Discussed potential for sedation and edema with gabapentin - she reports edema is generally well controlled, only needs spironolactone maybe once weekly. Will discuss with Dr. Derrel Nip.  Allergies: Moderately well controlled per patient report; current regimen: cetirizine 10 mg daily PRN Previously recommended to continue current regimen at this time  Menopausal Symptoms: Well controlled per patient report; current regimen: premarin vaginal cream 1-2 times weekly Previously recommended to continue current regimen at this time.   GERD: Well controlled; omeprazole 20 mg BID Hx reflux esophagitis w/ chronic NSAID use. Appropriate to continue BID PPI at this time.   Supplements: Omega 3 fatty acids 1 g daily, vitamin E 200 units daily   Patient Goals/Self-Care Activities Over the next 90 days, patient will:  - take medications as prescribed collaborate with provider on medication access solutions target a minimum of 150 minutes of moderate intensity exercise weekly engage in dietary modifications by moderating portion sizes  Follow Up Plan: Telephone follow up appointment with care management team member scheduled for: ~ 8 weeks     Medication Assistance:  Ozempic obtained through [employer] medication assistance program.  Enrollment ends 06/16/21  Patient's preferred pharmacy is:  [facility] [place], [place] [address] Phone: [phone] Fax: [place], [place] - [place] [address] Phone: [phone] Fax: [phone]  Follow Up:  Patient agrees to Care Plan and Follow-up.  Plan: Telephone  follow up appointment with care management team member scheduled for:  ~ 8 weeks  Catie Darnelle Maffucci,  PharmD, [place], [place] Clinical Pharmacist [employer] at [place]

## 2021-04-01 NOTE — Patient Instructions (Signed)
Visit Information  PATIENT GOALS:  Goals Addressed               This Visit's Progress     Patient Stated     Self-Management (pt-stated)        Patient Goals/Self-Care Activities Over the next 90 days, patient will:  - take medications as prescribed collaborate with provider on medication access solutions target a minimum of 150 minutes of moderate intensity exercise weekly engage in dietary modifications by moderating portion sizes        Patient verbalizes understanding of instructions provided today and agrees to view in [place].    Plan: Telephone follow up appointment with care management team member scheduled for:  ~ 8 weeks  Catie Darnelle Maffucci, PharmD, [place], [place] Clinical Pharmacist [employer] at [employer] [phone]

## 2021-04-15 ENCOUNTER — Encounter: Payer: Self-pay | Admitting: Family

## 2021-04-15 ENCOUNTER — Telehealth: Payer: Medicare Other | Admitting: Family

## 2021-04-15 ENCOUNTER — Telehealth (INDEPENDENT_AMBULATORY_CARE_PROVIDER_SITE_OTHER): Payer: Medicare Other | Admitting: Family

## 2021-04-15 ENCOUNTER — Other Ambulatory Visit: Payer: Self-pay

## 2021-04-15 VITALS — Ht 70.0 in | Wt 275.6 lb

## 2021-04-15 DIAGNOSIS — M791 Myalgia, unspecified site: Secondary | ICD-10-CM | POA: Diagnosis not present

## 2021-04-15 DIAGNOSIS — U071 COVID-19: Secondary | ICD-10-CM | POA: Diagnosis not present

## 2021-04-15 MED ORDER — MOLNUPIRAVIR EUA 200MG CAPSULE: 4.0000 | ORAL_CAPSULE | Freq: Two times a day (BID) | ORAL | 0 refills | Status: AC

## 2021-04-15 NOTE — Telephone Encounter (Signed)
Patient is scheduled with Druscilla Brownie at 3:15p today.

## 2021-04-16 DIAGNOSIS — M1612 Unilateral primary osteoarthritis, left hip: Secondary | ICD-10-CM | POA: Diagnosis not present

## 2021-04-16 DIAGNOSIS — E782 Mixed hyperlipidemia: Secondary | ICD-10-CM

## 2021-04-19 NOTE — Progress Notes (Signed)
Virtual Visit via Video   I connected with patient on 04/19/21 at  3:15 PM EDT by a video enabled telemedicine application and verified that I am speaking with the correct person using two identifiers.  Location patient: Home Location provider: [place], Office Persons participating in the virtual visit: Dutch Quint, Lurene Shadow  I discussed the limitations of evaluation and management by telemedicine and the availability of in person appointments. The patient expressed understanding and agreed to proceed.  Subjective:   HPI:  60 year old female presents via video visit with c/o runny nose, cough, congestion, body aches  after testing positive for COVID. She is vaccinated and boosted. Has been taking Tylenol that helps. Denies any SOB  ROS:   See pertinent positives and negatives per HPI.  Patient Active Problem List   Diagnosis Date Noted   Vertigo 01/01/2021   Elevated blood-pressure reading, without diagnosis of hypertension 08/25/2020   Encounter for Papanicolaou smear for cervical cancer screening 08/25/2020   Status post total replacement of hip 05/02/2018   Primary osteoarthritis of left hip 01/30/2018   DDD (degenerative disc disease), lumbar 02/12/2017   Snoring 02/03/2016   Special screening for malignant neoplasms, colon    Benign neoplasm of sigmoid colon    Hepatic steatosis 03/09/2014   Welcome to Medicare preventive visit 03/08/2014   S/P abdominal supracervical subtotal hysterectomy 03/05/2014   Sciatica 04/29/2013   Hyperlipidemia, unspecified 01/28/2013   Morbid obesity ([place]) 01/25/2012   Prediabetes 01/25/2012   Routine general medical examination at a health care facility 01/25/2012   Tobacco abuse 01/25/2012   Reflux esophagitis    Diverticulosis of intestine     Social History   Tobacco Use   Smoking status: Former    Packs/day: 1.00    Years: 35.00    Pack years: 35.00    Types: Cigarettes   Smokeless tobacco: Never   Substance Use Topics   Alcohol use: Not Currently    Current Outpatient Medications:    aspirin EC 81 MG tablet, Take 81 mg by mouth daily., Disp: , Rfl:    celecoxib (CELEBREX) 200 MG capsule, Take 1 capsule (200 mg total) by mouth 2 (two) times daily., Disp: 180 capsule, Rfl: 3   cetirizine (ZYRTEC) 10 MG tablet, Take 10 mg by mouth daily., Disp: , Rfl:    conjugated estrogens (PREMARIN) vaginal cream, Place one applicatorful vaginally two to three times weekly, Disp: 90 g, Rfl: 3   Insulin Pen Needle (PEN NEEDLES) 32G X 4 MM MISC, Use as directed, Disp: 100 each, Rfl: 1   molnupiravir EUA (LAGEVRIO) 200 mg CAPS capsule, Take 4 capsules (800 mg total) by mouth 2 (two) times daily for 5 days., Disp: 40 capsule, Rfl: 0   Omega-3 Fatty Acids (FISH OIL) 1000 MG CAPS, Take 1,000 mg by mouth daily. , Disp: , Rfl:    omeprazole (PRILOSEC) 20 MG capsule, TAKE ONE CAPSULE BY MOUTH TWICE DAILY BEFORE A MEAL, Disp: 180 capsule, Rfl: 1   Semaglutide, 2 MG/DOSE, 8 MG/3ML SOPN, Inject 2 mg into the skin once a week. After finishing 1 mg dose, increase to 2 mg weekly, Disp: 3 mL, Rfl: 0   simvastatin (ZOCOR) 20 MG tablet, Take 1 tablet (20 mg total) by mouth at bedtime., Disp: 90 tablet, Rfl: 3   vitamin E 200 UNIT capsule, Take 200 Units by mouth daily., Disp: , Rfl:    methocarbamol (ROBAXIN) 500 MG tablet, Take 1 tablet (500 mg total) by  mouth 4 (four) times daily. (Patient not taking: No sig reported), Disp: 90 tablet, Rfl: 0   oxyCODONE (OXY IR/ROXICODONE) 5 MG immediate release tablet, Take 1 tablet (5 mg total) by mouth at bedtime and may repeat dose one time if needed. (Patient not taking: No sig reported), Disp: 60 tablet, Rfl: 0   oxyCODONE (OXY IR/ROXICODONE) 5 MG immediate release tablet, TAKE 1 TABLET BY MOUTH 2 TIMES DAILY AS NEEDED FOR SEVERE PAIN. (Patient not taking: No sig reported), Disp: 60 tablet, Rfl: 0   spironolactone (ALDACTONE) 50 MG tablet, Take 1 tablet (50 mg total) by mouth  daily. (Patient not taking: No sig reported), Disp: 90 tablet, Rfl: 3  Allergies  Allergen Reactions   Sulfa Antibiotics Swelling and Other (See Comments)    Angioedema THROAT AND TONGUE HAD SWELLING   Chantix [Varenicline] Nausea Only   Codeine Sulfate Nausea Only   Fenofibrate Other (See Comments)    Muscle aches   Pseudoephedrine Hcl Palpitations   Wellbutrin [Bupropion] Hives    Objective:   Ht 5\' 10"  (1.778 m)   Wt 275 lb 9.2 oz (125 kg)   BMI 39.54 kg/m   Patient is well-developed, well-nourished in no acute distress.  Resting comfortably at home.  Head is normocephalic, atraumatic.  No labored breathing.  Speech is clear and coherent with logical content.  Patient is alert and oriented at baseline.    Assessment and Plan:   Barbar was seen today for covid positive, sore throat, nasal congestion, cough and generalized body aches.  Diagnoses and all orders for this visit:  COVID-19  Myalgia  Other orders -     molnupiravir EUA (LAGEVRIO) 200 mg CAPS capsule; Take 4 capsules (800 mg total) by mouth 2 (two) times daily for 5 days.   Call the office with any questions or concerns. Recheck as scheduled and as needed.    Kennyth Arnold, FNP 04/19/2021

## 2021-05-10 NOTE — Telephone Encounter (Signed)
Patient is calling in to check on the statu of the message below to have her labs done before her visit with Dr.Tullo.

## 2021-05-11 NOTE — Telephone Encounter (Signed)
Patient is calling in to check on the status of the message below and she also received a call to schedule an automated call for an a1c appointment.Please add orders for the patient.

## 2021-05-12 NOTE — Telephone Encounter (Signed)
Have we received the labs she mailed in?  They should not be put in a green folder that I only look at weekends

## 2021-06-03 ENCOUNTER — Ambulatory Visit (INDEPENDENT_AMBULATORY_CARE_PROVIDER_SITE_OTHER): Payer: Medicare Other | Admitting: Pharmacist

## 2021-06-03 VITALS — Wt 271.0 lb

## 2021-06-03 DIAGNOSIS — R7303 Prediabetes: Secondary | ICD-10-CM

## 2021-06-03 DIAGNOSIS — Z96641 Presence of right artificial hip joint: Secondary | ICD-10-CM | POA: Diagnosis not present

## 2021-06-03 DIAGNOSIS — K760 Fatty (change of) liver, not elsewhere classified: Secondary | ICD-10-CM

## 2021-06-03 DIAGNOSIS — M1612 Unilateral primary osteoarthritis, left hip: Secondary | ICD-10-CM

## 2021-06-03 DIAGNOSIS — E782 Mixed hyperlipidemia: Secondary | ICD-10-CM

## 2021-06-03 DIAGNOSIS — Z96642 Presence of left artificial hip joint: Secondary | ICD-10-CM | POA: Diagnosis not present

## 2021-06-03 NOTE — Chronic Care Management (AMB) (Addendum)
Chronic Care Management CCM Pharmacy Note  06/03/2021 Name:  Lisa Valencia MRN:  [id number] DOB:  1961/07/15  Summary: - Continuing to do well on Ozempic therapy  Recommendations/Changes made from today's visit: - Continue current regimen. Reapplying for assistance.   Subjective: Lisa Valencia is an 60 y.o. year old female who is a primary patient of Tullo, Aris Everts, MD.  The CCM team was consulted for assistance with disease management and care coordination needs.    Engaged with patient by telephone for follow up visit for pharmacy case management and/or care coordination services.   Objective:  Medications Reviewed Today     Reviewed by De Hollingshead, RPH-CPP (Pharmacist) on 06/03/21 at [phone]  Med List Status: <None>   Medication Order Taking? Sig Documenting Provider Last Dose Status Informant  aspirin EC 81 MG tablet [id number] Yes Take 81 mg by mouth daily. [provider] Taking Active   celecoxib (CELEBREX) 200 MG capsule [id number] Yes Take 1 capsule (200 mg total) by mouth 2 (two) times daily. Crecencio Mc, MD Taking Active   cetirizine (ZYRTEC) 10 MG tablet [id number] Yes Take 10 mg by mouth daily. [provider] Taking Active   conjugated estrogens (PREMARIN) vaginal cream [id number]  Place one applicatorful vaginally two to three times weekly Crecencio Mc, MD  Active            Med Note Kelby Aline Nov 25, 2020  8:19 AM) 1-2 times weekly  Insulin Pen Needle (PEN NEEDLES) 32G X 4 MM MISC [id number]  Use as directed Crecencio Mc, MD  Active   methocarbamol (ROBAXIN) 500 MG tablet [id number]  Take 1 tablet (500 mg total) by mouth 4 (four) times daily.  Patient not taking: No sig reported   Crecencio Mc, MD  Active   Omega-3 Fatty Acids (FISH OIL) 1000 MG CAPS [id number] Yes Take 1,000 mg by mouth daily.  [provider] Taking Active Self  omeprazole (PRILOSEC) 20 MG capsule [id number] Yes TAKE ONE CAPSULE BY MOUTH  TWICE DAILY BEFORE A MEAL Crecencio Mc, MD Taking Active   Semaglutide, 2 MG/DOSE, 8 MG/3ML SOPN [id number] Yes Inject 2 mg into the skin once a week. After finishing 1 mg dose, increase to 2 mg weekly Crecencio Mc, MD Taking Active            Med Note Nat Christen Jun 03, 2021  9:47 AM)    simvastatin (ZOCOR) 20 MG tablet [id number] Yes Take 1 tablet (20 mg total) by mouth at bedtime. Crecencio Mc, MD Taking Active   spironolactone (ALDACTONE) 50 MG tablet [id number] No Take 1 tablet (50 mg total) by mouth daily.  Patient not taking: Reported on 04/01/2021   Crecencio Mc, MD Not Taking Active            Med Note Nat Christen Apr 01, 2021  9:08 AM)    vitamin E 200 UNIT capsule [id number]  Take 200 Units by mouth daily. [provider]  Active             Pertinent Labs:   Lab Results  Component Value Date   HGBA1C 5.6 12/21/2020   Lab Results  Component Value Date   CHOL 157 08/11/2020   HDL 37.50 (L) 08/11/2020   LDLCALC 160 (H) 05/17/2012   LDLDIRECT 94.0 08/11/2020   TRIG 206.0 (H) 08/11/2020   CHOLHDL 4 08/11/2020  Lab Results  Component Value Date   CREATININE 0.76 12/21/2020   BUN 13 12/21/2020   NA 144 12/21/2020   K 4.6 12/21/2020   CL 106 12/21/2020   CO2 27 12/21/2020    SDOH:  (Social Determinants of Health) assessments and interventions performed:  SDOH Interventions    Flowsheet Row Most Recent Value  SDOH Interventions   Financial Strain Interventions Other (Comment)  [manufacturer assistance]       CCM Care Plan  Review of patient past medical history, allergies, medications, health status, including review of consultants reports, laboratory and other test data, was performed as part of comprehensive evaluation and provision of chronic care management services.   Care Plan : Medication Management  Updates made by De Hollingshead, RPH-CPP since 06/03/2021 12:00 AM     Problem: Obesity, Chronic  Pain, Pre-diabetes,      Long-Range Goal: Disease Progression Prevention   Start Date: 11/25/2020  This Visit's Progress: On track  Recent Progress: On track  Priority: High  Note:   Current Barriers:  Unable to achieve control of weight   Pharmacist Clinical Goal(s):  Over the next 90 days, patient will achieve goal weight loss of 5-10% of baseline body weight through collaboration with PharmD and provider.   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  Health Maintenance Yearly diabetic eye exam: up to date Yearly diabetic foot exam: up to date Urine microalbumin: up to date - reports she had done by insurance Yearly influenza vaccination: due - recommend, encouraged to pursue at local pharmacy Td/Tdap vaccination: up to date Pneumonia vaccination: due - recommend to discuss w/ PCP at next appointment COVID vaccinations: due - recommended to pursue bivalent booster at local pharmacy Shingrix vaccinations: due - recommended to pursue at local pharmacy Colonoscopy: up to date  Pre-diabetes, Obesity: Unable to achieve weight loss goals through diet and physical activity alone; current treatment: Ozempic 2 mg weekly  Most recent weight: 271 lbs; baseline weight: 288 lbs; total weight loss 17 lbs (5.9% from baseline) Previously tried: OTC stimulants for weight loss; hx metformin - would have symptoms of low glucose before supper Current meal patterns: breakfast: homemade plain yogurt (low fat, 2% milk), blueberries, orange juice concentrate; snack: string cheese; lunch: hamburger, sometimes bun but generally avoids  as husband is gluten intolerant; shrimp; dinner: salmon patties, vegetables; snacks: Atkins bars, ice cream- low calorie fudgecicle; drinks: unsweet tea w/ splenda, 1 diet pepsi per day; reports she has been working on fibers, proteins;  Current exercise: limited by back pain, has been worse lately. Also had COVID a few weeks ago. F/u with Dr. Marry Guan this afternoon. Trying to walk more with friends. Doing work around the yard. Sometimes overdoes it.  Praised for continued focus on dietary choices and goal of increasing exercise.  Will collaborate w/ CPhT, patient, and providers to reapply for patient assistance for [place] for 2023. Continue current regimen at this time  Lower Extremity Edema/Blood Pressure: Controlled at last office visit; current treatment: spironolactone 50 mg daily- though taking PRN for LEE Previously recommended to continue current regimen as above. Follow for impact of expected weight loss on BP.   Hyperlipidemia: Controlled at goal LDL <100; current  treatment: simvastatin 20 mg daily Previously recommended to continue current regimen at this time  Chronic Back Pain: Worsened per patient report; current regimen: celecoxib 200 mg BID, methocarbamol 500 mg PRN, oxycodone 5 mg PRN- reports that since she weaned off oxycodone (as she requested) and has been struggling with chronic back pain. Requested I remove from medication list.  Hx acetaminophen without benefit. Hx lidocaine patches, Voltaren gel, duloxetine - reports that she had been on duloxetine mostly for antidepressant effect and came off due to wanting to stop antidepressants  Reports she tries to stay active.  Previously discussed use of gabapentin vs duloxetine (or combination moving forward) for chronic back pain. Discuss w/ PCP moving forward.   Allergies: Moderately well controlled per patient report; current regimen: cetirizine 10 mg daily PRN Previously recommended to continue current regimen at this  time  Menopausal Symptoms: Well controlled per patient report; current regimen: premarin vaginal cream 1-2 times weekly Previously recommended to continue current regimen at this time.   GERD: Well controlled; omeprazole 20 mg BID Hx reflux esophagitis w/ chronic NSAID use. Appropriate to continue BID PPI at this time.   Supplements: Omega 3 fatty acids 1 g daily, vitamin E 200 units daily (fatty liver disease)   Patient Goals/Self-Care Activities Over the next 90 days, patient will:  - take medications as prescribed collaborate with provider on medication access solutions target a minimum of 150 minutes of moderate intensity exercise weekly engage in dietary modifications by moderating portion sizes       Plan: Telephone follow up appointment with care management team member scheduled for:  3 months  Catie Darnelle Maffucci, PharmD, [place], CPP Clinical Pharmacist [employer] at [facility] [phone]   I have reviewed the above information and agree with above.   Deborra Medina, MD

## 2021-06-03 NOTE — Patient Instructions (Signed)
Visit Information  If you need to cancel or re-schedule our visit, please call [phone] and our care guide team will be happy to assist you.   Patient verbalizes understanding of instructions provided today and agrees to view in [place].   Patient Goals/Self-Care Activities Over the next 90 days, patient will:  - take medications as prescribed collaborate with provider on medication access solutions target a minimum of 150 minutes of moderate intensity exercise weekly engage in dietary modifications by moderating portion sizes          Plan: Telephone follow up appointment with care management team member scheduled for:  3 months   Catie Darnelle Maffucci, PharmD, [place], [place] Pharmacist [employer] at [employer] [phone]

## 2021-06-08 ENCOUNTER — Telehealth: Payer: Self-pay | Admitting: Pharmacy Technician

## 2021-06-08 DIAGNOSIS — Z596 Low income: Secondary | ICD-10-CM

## 2021-06-08 NOTE — Progress Notes (Signed)
[place] [facility])                                            [place] Team    06/08/2021  Lisa Valencia 1960/10/01 [id number]  FOR 2023 RE ENROLLMENT                                      Medication Assistance Referral  Referral From: [facility] Embedded RPh Catie T.   Medication/Company: [employer] / [employer] Patient application portion:  Education officer, museum portion: Interoffice Mailed to Dr. Derrel Nip Provider address/fax verified via: Office website     [employer]. Nelly Scriven, [place]  [phone]

## 2021-06-16 DIAGNOSIS — M1612 Unilateral primary osteoarthritis, left hip: Secondary | ICD-10-CM | POA: Diagnosis not present

## 2021-06-16 DIAGNOSIS — E782 Mixed hyperlipidemia: Secondary | ICD-10-CM

## 2021-06-22 ENCOUNTER — Other Ambulatory Visit: Payer: Self-pay | Admitting: Internal Medicine

## 2021-06-22 DIAGNOSIS — R7303 Prediabetes: Secondary | ICD-10-CM

## 2021-06-22 MED ORDER — SEMAGLUTIDE (2 MG/DOSE) 8 MG/3ML ~~LOC~~ SOPN: 2.0000 mg | PEN_INJECTOR | SUBCUTANEOUS | 0 refills | Status: DC

## 2021-06-23 ENCOUNTER — Other Ambulatory Visit: Payer: Self-pay | Admitting: Internal Medicine

## 2021-06-23 ENCOUNTER — Encounter: Payer: Self-pay | Admitting: Internal Medicine

## 2021-06-23 ENCOUNTER — Telehealth: Payer: Self-pay | Admitting: Pharmacist

## 2021-06-23 NOTE — Telephone Encounter (Signed)
Medication Samples have been labeled and logged for the patient.  Drug name: Ozempic       Strength: 2 mg        Qty: 1 pen  LOT: [id number]  Exp.Date: 01/14/2022  Dosing instructions: Inject 2 mg weekly  The patient has been instructed regarding the correct time, dose, and frequency of taking this medication, including desired effects and most common side effects.   De Hollingshead 3:14 PM 06/23/2021

## 2021-06-28 ENCOUNTER — Telehealth: Payer: Self-pay | Admitting: Pharmacy Technician

## 2021-06-28 DIAGNOSIS — Z596 Low income: Secondary | ICD-10-CM

## 2021-06-28 NOTE — Progress Notes (Signed)
[facility])                                            [place] Team    06/28/2021  YENTY BLOCH 1961-03-19 [id number]  Received both patient and provider portion(s) of patient assistance application(s) for Ozempic. Faxed completed application and required documents into [employer].    Shemekia Patane P. Faigy Stretch, [place]  [phone]

## 2021-07-05 ENCOUNTER — Ambulatory Visit (INDEPENDENT_AMBULATORY_CARE_PROVIDER_SITE_OTHER): Payer: Medicare Other | Admitting: Internal Medicine

## 2021-07-05 ENCOUNTER — Encounter: Payer: Self-pay | Admitting: Internal Medicine

## 2021-07-05 ENCOUNTER — Other Ambulatory Visit: Payer: Self-pay

## 2021-07-05 VITALS — BP 128/78 | HR 77 | Temp 97.8°F | Ht 70.0 in | Wt 270.8 lb

## 2021-07-05 DIAGNOSIS — R7303 Prediabetes: Secondary | ICD-10-CM

## 2021-07-05 DIAGNOSIS — E559 Vitamin D deficiency, unspecified: Secondary | ICD-10-CM | POA: Diagnosis not present

## 2021-07-05 DIAGNOSIS — J3089 Other allergic rhinitis: Secondary | ICD-10-CM

## 2021-07-05 DIAGNOSIS — M5431 Sciatica, right side: Secondary | ICD-10-CM

## 2021-07-05 DIAGNOSIS — J309 Allergic rhinitis, unspecified: Secondary | ICD-10-CM | POA: Insufficient documentation

## 2021-07-05 DIAGNOSIS — E782 Mixed hyperlipidemia: Secondary | ICD-10-CM | POA: Diagnosis not present

## 2021-07-05 LAB — LIPID PANEL
Cholesterol: 164 mg/dL (ref 0–200)
HDL: 48.5 mg/dL (ref >39.00)
LDL Cholesterol: 88 mg/dL (ref 0–99)
NonHDL: 115.82
Total CHOL/HDL Ratio: 3
Triglycerides: 137 mg/dL (ref 0.0–149.0)
VLDL: 27.4 mg/dL (ref 0.0–40.0)

## 2021-07-05 LAB — COMPREHENSIVE METABOLIC PANEL
ALT: 18 U/L (ref 0–35)
AST: 15 U/L (ref 0–37)
Albumin: 4.4 g/dL (ref 3.5–5.2)
Alkaline Phosphatase: 44 U/L (ref 39–117)
BUN: 14 mg/dL (ref 6–23)
CO2: 27 mEq/L (ref 19–32)
Calcium: 9.8 mg/dL (ref 8.4–10.5)
Chloride: 103 mEq/L (ref 96–112)
Creatinine, Ser: 0.67 mg/dL (ref 0.40–1.20)
GFR: 94.95 mL/min (ref >60.00)
Glucose, Bld: 86 mg/dL (ref 70–99)
Potassium: 4.6 mEq/L (ref 3.5–5.1)
Sodium: 140 mEq/L (ref 135–145)
Total Bilirubin: 0.4 mg/dL (ref 0.2–1.2)
Total Protein: 7.2 g/dL (ref 6.0–8.3)

## 2021-07-05 LAB — VITAMIN D 25 HYDROXY (VIT D DEFICIENCY, FRACTURES): VITD: 17.95 ng/mL — ABNORMAL LOW (ref 30.00–100.00)

## 2021-07-05 LAB — MICROALBUMIN / CREATININE URINE RATIO
Creatinine,U: 78.6 mg/dL
Microalb Creat Ratio: 1.1 mg/g (ref 0.0–30.0)
Microalb, Ur: 0.9 mg/dL (ref 0.0–1.9)

## 2021-07-05 LAB — HEMOGLOBIN A1C: Hgb A1c MFr Bld: 5.7 % (ref 4.6–6.5)

## 2021-07-05 NOTE — Assessment & Plan Note (Signed)
No longer managed with oxycodone.  Using celebrex, tylenol and MR's

## 2021-07-05 NOTE — Patient Instructions (Addendum)
You are doing great!    Continue ozempic   I would rather you walk for 30 minutes than rake leaves    For your allergies and drippy nose:  You can use zyrtec twice daily,  or  substitute benadryl for the evening dose , which will  help with the runny nose    May  our Lord give you peace and joy during this holiday season, and may the promise of His return bring you comfort and hope for the future.  Regards,   Deborra Medina, MD

## 2021-07-05 NOTE — Assessment & Plan Note (Signed)
Treated with diet given history  recurrent hypoglycemic symptoms with metformin even at the lowest dose.  She is tolerating Ozempic  for appetite suppression  Lab Results  Component Value Date   HGBA1C 5.7 07/05/2021

## 2021-07-05 NOTE — Progress Notes (Signed)
Subjective:  Patient ID: Lisa Valencia, female    DOB: 1960-12-16  Age: 60 y.o. MRN: [id number]  CC: The primary encounter diagnosis was Mixed hyperlipidemia. Diagnoses of Prediabetes, Sciatica of right side, Vitamin D deficiency, Morbid obesity ([place]), and Non-seasonal allergic rhinitis due to other allergic trigger were also pertinent to this visit.  HPI Lisa Valencia presents follow up on morbid obesity, prediabetes and chronic low back pain managed with  celebrex and tylenol  Chief Complaint  Patient presents with   Follow-up    Follow up on prediabetes    1) morbid obesity /prediabetes:  has lost 18 lbs since June,  when she started ozempic   provided by the medication  assistance program.  Using ozempic 2 mg dose  for the last 3 months .  Dietary counselling done by Catie resulted in improved weights   2) back pain managed with celebrex  regularly   prn use of MR of oxycodone . Does not use a back brace.  Aggravated her pain recently by raking leaves one weekend    3) allergic rhinitis :  symptoms worse lately. Tried Zyzal .  Wakes up sneezing every morning.       Outpatient Medications Prior to Visit  Medication Sig Dispense Refill   aspirin EC 81 MG tablet Take 81 mg by mouth daily.     celecoxib (CELEBREX) 200 MG capsule Take 1 capsule (200 mg total) by mouth 2 (two) times daily. 180 capsule 3   cetirizine (ZYRTEC) 10 MG tablet Take 10 mg by mouth daily.     conjugated estrogens (PREMARIN) vaginal cream Place one applicatorful vaginally two to three times weekly 90 g 3   Insulin Pen Needle (PEN NEEDLES) 32G X 4 MM MISC Use as directed 100 each 1   methocarbamol (ROBAXIN) 500 MG tablet Take 1 tablet (500 mg total) by mouth 4 (four) times daily. 90 tablet 0   Omega-3 Fatty Acids (FISH OIL) 1000 MG CAPS Take 1,000 mg by mouth daily.      omeprazole (PRILOSEC) 20 MG capsule TAKE ONE CAPSULE BY MOUTH TWICE DAILY BEFORE A MEAL 180 capsule 3   Semaglutide, 2 MG/DOSE, 8 MG/3ML  SOPN Inject 2 mg into the skin once a week. After finishing 1 mg dose, increase to 2 mg weekly 3 mL 0   simvastatin (ZOCOR) 20 MG tablet Take 1 tablet (20 mg total) by mouth at bedtime. 90 tablet 3   spironolactone (ALDACTONE) 50 MG tablet Take 1 tablet (50 mg total) by mouth daily. 90 tablet 3   vitamin E 200 UNIT capsule Take 200 Units by mouth daily.     No facility-administered medications prior to visit.    Review of Systems;  Patient denies headache, fevers, malaise, unintentional weight loss, skin rash, eye pain, sinus congestion and sinus pain, sore throat, dysphagia,  hemoptysis , cough, dyspnea, wheezing, chest pain, palpitations, orthopnea, edema, abdominal pain, nausea, melena, diarrhea, constipation, flank pain, dysuria, hematuria, urinary  Frequency, nocturia, numbness, tingling, seizures,  Focal weakness, Loss of consciousness,  Tremor, insomnia, depression, anxiety, and suicidal ideation.      Objective:  BP 128/78 (BP Location: Left Arm, Patient Position: Sitting, Cuff Size: Large)    Pulse 77    Temp 97.8 F (36.6 C) (Oral)    Ht $R'5\' 10"'uP$  (1.778 m)    Wt 270 lb 12.8 oz (122.8 kg)    SpO2 97%    BMI 38.86 kg/m   BP Readings from Last 3  Encounters:  07/05/21 128/78  03/04/21 120/82  01/01/21 132/88    Wt Readings from Last 3 Encounters:  07/05/21 270 lb 12.8 oz (122.8 kg)  06/03/21 271 lb (122.9 kg)  04/15/21 275 lb 9.2 oz (125 kg)    General appearance: alert, cooperative and appears stated age Ears: normal TM's and external ear canals both ears Throat: lips, mucosa, and tongue normal; teeth and gums normal Neck: no adenopathy, no carotid bruit, supple, symmetrical, trachea midline and thyroid not enlarged, symmetric, no tenderness/mass/nodules Back: symmetric, no curvature. ROM normal. No CVA tenderness. Lungs: clear to auscultation bilaterally Heart: regular rate and rhythm, S1, S2 normal, no murmur, click, rub or gallop Abdomen: soft, non-tender; bowel sounds  normal; no masses,  no organomegaly Pulses: 2+ and symmetric Skin: Skin color, texture, turgor normal. No rashes or lesions Lymph nodes: Cervical, supraclavicular, and axillary nodes normal.  Lab Results  Component Value Date   HGBA1C 5.7 07/05/2021   HGBA1C 5.6 12/21/2020   HGBA1C 6.1 08/11/2020    Lab Results  Component Value Date   CREATININE 0.67 07/05/2021   CREATININE 0.76 12/21/2020   CREATININE 0.81 08/11/2020    Lab Results  Component Value Date   WBC 4.6 07/03/2018   HGB 13.8 07/03/2018   HCT 41.9 07/03/2018   PLT 198.0 07/03/2018   GLUCOSE 86 07/05/2021   CHOL 164 07/05/2021   TRIG 137.0 07/05/2021   HDL 48.50 07/05/2021   LDLDIRECT 94.0 08/11/2020   LDLCALC 88 07/05/2021   ALT 18 07/05/2021   AST 15 07/05/2021   NA 140 07/05/2021   K 4.6 07/05/2021   CL 103 07/05/2021   CREATININE 0.67 07/05/2021   BUN 14 07/05/2021   CO2 27 07/05/2021   TSH 0.76 02/01/2016   INR 0.90 04/19/2018   HGBA1C 5.7 07/05/2021   MICROALBUR 0.9 07/05/2021    MM 3D SCREEN BREAST BILATERAL  Result Date: 09/13/2020 CLINICAL DATA:  Screening. EXAM: DIGITAL SCREENING BILATERAL MAMMOGRAM WITH TOMOSYNTHESIS AND CAD TECHNIQUE: Bilateral screening digital craniocaudal and mediolateral oblique mammograms were obtained. Bilateral screening digital breast tomosynthesis was performed. The images were evaluated with computer-aided detection. COMPARISON:  Previous exam(s). ACR Breast Density Category a: The breast tissue is almost entirely fatty. FINDINGS: There are no findings suspicious for malignancy. IMPRESSION: No mammographic evidence of malignancy. A result letter of this screening mammogram will be mailed directly to the patient. RECOMMENDATION: Screening mammogram in one year. (Code:SM-B-01Y) BI-RADS CATEGORY  1: Negative. Electronically Signed   By: Lillia Mountain M.D.   On: 09/13/2020 11:27    Assessment & Plan:   Problem List Items Addressed This Visit     Sciatica    No longer  managed with oxycodone.  Using celebrex, tylenol and MR's       Prediabetes    Treated with diet given history  recurrent hypoglycemic symptoms with metformin even at the lowest dose.  She is tolerating Ozempic  for appetite suppression  Lab Results  Component Value Date   HGBA1C 5.7 07/05/2021         Relevant Orders   HgB A1c (Completed)   Comp Met (CMET) (Completed)   Urine Microalbumin w/creat. ratio (Completed)   Morbid obesity (H[facility])    20 lb weight loss since starting therapy.  I have strongly advised her to resume exercise on a regular basis .  Continue appetite suppression with Ozempic      Hyperlipidemia, unspecified - Primary   Relevant Orders   Lipid Profile (Completed)   Allergic  rhinitis    Advised to take zyrtec bid or once daily in the morning and 25 mg benadryl qhs       Other Visit Diagnoses     Vitamin D deficiency       Relevant Orders   VITAMIN D 25 Hydroxy (Vit-D Deficiency, Fractures) (Completed)       I am having Quadasia A. Corallo maintain her Fish Oil, cetirizine, aspirin EC, conjugated estrogens, celecoxib, spironolactone, vitamin E, Pen Needles, simvastatin, methocarbamol, Semaglutide (2 MG/DOSE), and omeprazole.  No orders of the defined types were placed in this encounter.    I provided  30 minutes of  face-to-face time during this encounter reviewing patient's current problems and past surgeries, labs and imaging studies, providing counseling on the above mentioned problems , and coordination  of care .   Follow-up: No follow-ups on file.   Crecencio Mc, MD

## 2021-07-05 NOTE — Assessment & Plan Note (Addendum)
20 lb weight loss since starting therapy.  I have strongly advised her to resume exercise on a regular basis .  Continue appetite suppression with Ozempic

## 2021-07-05 NOTE — Assessment & Plan Note (Signed)
Advised to take zyrtec bid or once daily in the morning and 25 mg benadryl qhs

## 2021-07-08 ENCOUNTER — Telehealth: Payer: Self-pay

## 2021-07-08 ENCOUNTER — Other Ambulatory Visit: Payer: Self-pay | Admitting: Internal Medicine

## 2021-07-08 DIAGNOSIS — E559 Vitamin D deficiency, unspecified: Secondary | ICD-10-CM

## 2021-07-08 MED ORDER — ERGOCALCIFEROL 1.25 MG (50000 UT) PO CAPS: 50000.0000 [IU] | ORAL_CAPSULE | ORAL | 0 refills | Status: DC

## 2021-07-08 NOTE — Telephone Encounter (Signed)
Spoke with pt to let her know that we have received her patient assistance medication and it is ready to be pick up. Pt stated that she would be by next week to pick it up.   Ozempic: 2 boxes

## 2021-07-08 NOTE — Assessment & Plan Note (Signed)
Resume megadose supplementation

## 2021-07-27 ENCOUNTER — Other Ambulatory Visit: Payer: Self-pay | Admitting: Internal Medicine

## 2021-07-27 ENCOUNTER — Encounter: Payer: Self-pay | Admitting: Internal Medicine

## 2021-07-27 NOTE — Telephone Encounter (Signed)
Is it okay to refill for a dental procedure?

## 2021-07-31 ENCOUNTER — Other Ambulatory Visit: Payer: Self-pay | Admitting: Internal Medicine

## 2021-07-31 DIAGNOSIS — R7303 Prediabetes: Secondary | ICD-10-CM

## 2021-07-31 DIAGNOSIS — E782 Mixed hyperlipidemia: Secondary | ICD-10-CM

## 2021-08-04 ENCOUNTER — Telehealth: Payer: Self-pay | Admitting: Pharmacist

## 2021-08-04 ENCOUNTER — Telehealth: Payer: Self-pay | Admitting: Pharmacy Technician

## 2021-08-04 DIAGNOSIS — R7303 Prediabetes: Secondary | ICD-10-CM

## 2021-08-04 DIAGNOSIS — Z596 Low income: Secondary | ICD-10-CM

## 2021-08-04 MED ORDER — SEMAGLUTIDE (2 MG/DOSE) 8 MG/3ML ~~LOC~~ SOPN: 2.0000 mg | PEN_INJECTOR | SUBCUTANEOUS | 2 refills | Status: DC

## 2021-08-04 NOTE — Telephone Encounter (Signed)
Called patient. Notified that she was denied for [employer] assistance for [employer], they noted her income was just over the income cutoff for approval. Also, noted that the company is requiring patients have a indicated diagnosis for patient assistance.   Will send prescription to patient's pharmacy to see if affordable. Will follow for any need for PA.

## 2021-08-04 NOTE — Progress Notes (Signed)
[facility])                                            [place] Team    08/04/2021  Lisa Valencia 21-Aug-1960 [id number]  Care coordination call placed to [place] in regard to [place] application.  Spoke to Edmund who informs patient has been DENIED as patient is over income.  In basket message sent to embedded PharmD to notify patient at next CCM OV.  Lisa Valencia, [place]  [phone]

## 2021-08-25 ENCOUNTER — Ambulatory Visit (INDEPENDENT_AMBULATORY_CARE_PROVIDER_SITE_OTHER): Payer: Medicare Other

## 2021-08-25 VITALS — Ht 70.0 in | Wt 270.0 lb

## 2021-08-25 DIAGNOSIS — Z0000 Encounter for general adult medical examination without abnormal findings: Secondary | ICD-10-CM | POA: Diagnosis not present

## 2021-08-25 NOTE — Progress Notes (Addendum)
Subjective:   Lisa Valencia is a 61 y.o. female who presents for an Initial Medicare Annual Wellness Visit.  Review of Systems    No ROS.  Medicare Wellness Virtual Visit.  Visual/audio telehealth visit, UTA vital signs.   See social history for additional risk factors.   Cardiac Risk Factors include: advanced age (>25men, >51 women);hypertension     Objective:    Today's Vitals   08/25/21 0909  Weight: 270 lb (122.5 kg)  Height: 5\' 10"  (1.778 m)   Body mass index is 38.74 kg/m.  Advanced Directives 08/25/2021 05/02/2018 05/02/2018 04/19/2018 06/05/2015  Does Patient Have a Medical Advance Directive? Yes Yes Yes No No  Type of Paramedic of [place];Living will Living will [place] - -  Does patient want to make changes to medical advance directive? No - Patient declined No - Patient declined - - -  Copy of [place] in Chart? Yes - validated most recent copy scanned in chart (See row information) No - copy requested Yes - -  Would patient like information on creating a medical advance directive? - No - Patient declined - Yes (MAU/Ambulatory/Procedural Areas - Information given) -   Current Medications (verified) Outpatient Encounter Medications as of 08/25/2021  Medication Sig   amoxicillin (AMOXIL) 500 MG capsule TAKE 4 CAPSULES ONE HOUR PRIOR TO DENTALPROCEDURE   aspirin EC 81 MG tablet Take 81 mg by mouth daily.   celecoxib (CELEBREX) 200 MG capsule Take 1 capsule (200 mg total) by mouth 2 (two) times daily.   cetirizine (ZYRTEC) 10 MG tablet Take 10 mg by mouth daily.   conjugated estrogens (PREMARIN) vaginal cream Place one applicatorful vaginally two to three times weekly   ergocalciferol (DRISDOL) 1.25 MG (50000 UT) capsule Take 1 capsule (50,000 Units total) by mouth once a week.   Insulin Pen Needle (PEN NEEDLES) 32G X 4 MM MISC Use as directed   methocarbamol (ROBAXIN) 500 MG tablet Take 1 tablet (500 mg  total) by mouth 4 (four) times daily.   Omega-3 Fatty Acids (FISH OIL) 1000 MG CAPS Take 1,000 mg by mouth daily.    omeprazole (PRILOSEC) 20 MG capsule TAKE ONE CAPSULE BY MOUTH TWICE DAILY BEFORE A MEAL   Semaglutide, 2 MG/DOSE, 8 MG/3ML SOPN Inject 2 mg into the skin once a week.   simvastatin (ZOCOR) 20 MG tablet Take 1 tablet (20 mg total) by mouth at bedtime.   spironolactone (ALDACTONE) 50 MG tablet Take 1 tablet (50 mg total) by mouth daily.   vitamin E 200 UNIT capsule Take 200 Units by mouth daily.   No facility-administered encounter medications on file as of 08/25/2021.   Allergies (verified) Sulfa antibiotics, Chantix [varenicline], Codeine sulfate, Fenofibrate, Pseudoephedrine hcl, and Wellbutrin [bupropion]   History: Past Medical History:  Diagnosis Date   Anxiety    Arthritis    Depression    Diverticulosis of intestine 2010   iftikhar   Esophagitis, reflux 2010    EGD by iftkihar, managed wiht dexilant   GERD (gastroesophageal reflux disease)    Impaired fasting glucose 01/25/2012   Needs eye exam.     Past Surgical History:  Procedure Laterality Date   ABDOMINAL HYSTERECTOMY  2009   supracervical    APPENDECTOMY     BREAST CYST ASPIRATION Right    rt fna-neg   COLONOSCOPY WITH PROPOFOL N/A 06/05/2015   Procedure: COLONOSCOPY WITH PROPOFOL;  Surgeon: Lucilla Lame, MD;  Location: [facility];  Service: Endoscopy;  Laterality: N/A;  Pt requests early. Pt for antibiotic pre-procedure  CECAL PROTRUSION BX. / WITH CLIP LOT # [id number] RESOLUTION 360 EXP 03/03/2016 SIGMOID POLYP   JOINT REPLACEMENT Right 2012   hip, [name]   TOTAL HIP ARTHROPLASTY Left 05/02/2018   Procedure: TOTAL HIP ARTHROPLASTY;  Surgeon: Dereck Leep, MD;  Location: [facility];  Service: Orthopedics;  Laterality: Left;   Family History  Problem Relation Age of Onset   Cancer Father        lung ca   Hyperlipidemia Father        PAD   Diabetes Sister    Cancer Paternal Aunt         breast   Breast cancer Paternal Aunt 63   Diabetes Maternal Grandfather    Cancer Maternal Grandfather        colon ca   Cancer Paternal Grandmother        colon   Cancer Paternal Grandfather        colon ca   Heart disease Neg Hx    Social History   Socioeconomic History   Marital status: Married    Spouse name: david   Number of children: Not on file   Years of education: Not on file   Highest education level: Not on file  Occupational History   Occupation:  Therapist, 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: [employer]    Comment: has applied for disability  Tobacco Use   Smoking status: Former    Packs/day: 1.00    Years: 35.00    Pack years: 35.00    Types: Cigarettes   Smokeless tobacco: Never  Vaping Use   Vaping Use: Never used  Substance and Sexual Activity   Alcohol use: Not Currently   Drug use: No   Sexual activity: Not on file  Other Topics Concern   Not on file  Social History Narrative   Not on file   Social Determinants of Health   Financial Resource Strain: Medium Risk   Difficulty of Paying Living Expenses: Somewhat hard  Food Insecurity: No Food Insecurity   Worried About Charity fundraiser in the Last Year: Never true   Ran Out of Food in the Last Year: Never true  Transportation Needs: No Transportation Needs   Lack of Transportation (Medical): No   Lack of Transportation (Non-Medical): No  Physical Activity: Insufficiently Active   Days of Exercise per Week: 3 days   Minutes of Exercise per Session: 10 min  Stress: No Stress Concern Present   Feeling of Stress : Not at all  Social Connections: Unknown   Frequency of Communication with Friends and Family: Not on file   Frequency of Social Gatherings with Friends and Family: Not on file   Attends Religious Services: Not on Electrical engineer or Organizations: Not on file   Attends Archivist Meetings: Not on file   Marital Status: Married    Tobacco Counseling Counseling  given: Not Answered   Clinical Intake: Pre-visit preparation completed: Yes       Diabetes: No  How often do you need to have someone help you when you read instructions, pamphlets, or other written materials from your doctor or pharmacy?: 1 - Never    Interpreter Needed?: No      Activities of Daily Living In your present state of health, do you have any difficulty performing the following activities: 08/25/2021  Hearing? N  Vision?  N  Difficulty concentrating or making decisions? N  Walking or climbing stairs? Y  Comment Chronic back pain. Paces self.  Dressing or bathing? N  Doing errands, shopping? N  Preparing Food and eating ? N  Using the Toilet? N  In the past six months, have you accidently leaked urine? Y  Comment Managed with poise pad.  Do you have problems with loss of bowel control? N  Managing your Medications? N  Managing your Finances? N  Housekeeping or managing your Housekeeping? N  Some recent data might be hidden    Patient Care Team: Crecencio Mc, MD as PCP - General (Internal Medicine) De Hollingshead, RPH-CPP (Pharmacist)  Indicate any recent Medical Services you may have received from other than [facility] providers in the past year (date may be approximate).     Assessment:   This is a routine wellness examination for Lisa Valencia.  Virtual Visit via Telephone Note  I connected with  Lisa Valencia on 08/25/21 at  9:00 AM EST by telephone and verified that I am speaking with the correct person using two identifiers.  Persons participating in the virtual visit: patient/Nurse Health Advisor   I discussed the limitations, risks, security and privacy concerns of performing an evaluation and management service by telephone and the availability of in person appointments. The patient expressed understanding and agreed to proceed.  Interactive audio and video telecommunications were attempted between this nurse and patient, however failed, due to  patient having technical difficulties OR patient did not have access to video capability.  We continued and completed visit with audio only.  Some vital signs may be absent or patient reported.   Hearing/Vision screen Hearing Screening - Comments:: Patient is able to hear conversational tones without difficulty.  No issues reported.   Vision Screening - Comments:: [facility], Dr. George Ina Wears corrective lenses They have seen their ophthalmologist in the last 12 months.   Dietary issues and exercise activities discussed: Current Exercise Habits: Home exercise routine, Type of exercise: walking, Intensity: Mild Healthy diet Good water intake   Goals Addressed             This Visit's Progress    Increase physical activity       Walk for exercise Weight goal 200lb       Depression Screen PHQ 2/9 Scores 08/25/2021 03/04/2021 11/25/2020 08/24/2020 11/15/2019 10/11/2016  PHQ - 2 Score 0 0 0 0 2 0  PHQ- 9 Score - - 5 - 7 -    Fall Risk Fall Risk  08/25/2021 07/05/2021 04/15/2021 03/04/2021 01/01/2021  Falls in the past year? 0 0 0 0 0  Number falls in past yr: 0 - - - 0  Injury with Fall? - - - - 0  Risk for fall due to : - No Fall Risks No Fall Risks - -  Follow up Falls evaluation completed Falls evaluation completed - Falls evaluation completed Falls evaluation completed   [place]: Home free of loose throw rugs in walkways, pet beds, electrical cords, etc? Yes  Adequate lighting in your home to reduce risk of falls? Yes   ASSISTIVE DEVICES UTILIZED TO PREVENT FALLS: Life alert? No  Use of a cane, walker or w/c? No   TIMED UP AND GO: Was the test performed? No .   Cognitive Function:  Patient is alert and oriented x3.  Enjoys reading, crossword puzzles and other brain health stimulating activities.  Immunizations Immunization History  Administered Date(s) Administered   Influenza Split 03/31/2014, 04/23/2015    Influenza,inj,Quad PF,6+ Mos 05/04/2018   Influenza-Unspecified 06/19/2017, 05/11/2019, 04/11/2020, 04/28/2021   Janssen (J&J) SARS-COV-2 Vaccination 10/24/2019, 05/15/2020   Moderna Covid-19 Vaccine Bivalent Booster 73yrs & up 04/28/2021   Moderna SARS-COV2 Booster Vaccination 12/25/2020   Td 11/15/2019   Tdap 07/01/2010   Zoster Recombinat (Shingrix) 04/28/2021, 08/04/2021   Screening Tests Health Maintenance  Topic Date Due   HIV Screening  08/25/2022 (Originally 01/04/1976)   MAMMOGRAM  09/09/2021   HEMOGLOBIN A1C  01/03/2022   URINE MICROALBUMIN  07/05/2022   PAP SMEAR-Modifier  08/25/2023   COLONOSCOPY (Pts 45-33yrs Insurance coverage will need to be confirmed)  06/13/2025   TETANUS/TDAP  11/14/2029   INFLUENZA VACCINE  Completed   COVID-19 Vaccine  Completed   Hepatitis C Screening  Completed   Zoster Vaccines- Shingrix  Completed   HPV VACCINES  Aged Out   Health Maintenance There are no preventive care reminders to display for this patient.  Vision Screening: Recommended annual ophthalmology exams for early detection of glaucoma and other disorders of the eye.  Dental Screening: Recommended annual dental exams for proper oral hygiene  Community Resource Referral / Chronic Care Management: CRR required this visit?  No   CCM required this visit?  No      Plan:   Keep all routine maintenance appointments.   I have personally reviewed and noted the following in the patients chart:   Medical and social history Use of alcohol, tobacco or illicit drugs  Current medications and supplements including opioid prescriptions. Patient is not currently taking opioid prescriptions. Functional ability and status Nutritional status Physical activity Advanced directives List of other physicians Hospitalizations, surgeries, and ER visits in previous 12 months Vitals Screenings to include cognitive, depression, and falls Referrals and appointments  In addition, I have  reviewed and discussed with patient certain preventive protocols, quality metrics, and best practice recommendations. A written personalized care plan for preventive services as well as general preventive health recommendations were provided to patient via mychart.     OBrien-Blaney, Druanne Bosques L, LPN   08/24/6145       I have reviewed the above information and agree with above.   Deborra Medina, MD

## 2021-08-25 NOTE — Patient Instructions (Addendum)
Ms. Finfrock , Thank you for taking time to come for your Medicare Wellness Visit. I appreciate your ongoing commitment to your health goals. Please review the following plan we discussed and let me know if I can assist you in the future.   These are the goals we discussed:  Goals       Patient Stated     Self-Management (pt-stated)      Patient Goals/Self-Care Activities Over the next 90 days, patient will:  - take medications as prescribed collaborate with provider on medication access solutions target a minimum of 150 minutes of moderate intensity exercise weekly engage in dietary modifications by moderating portion sizes      Other     Increase physical activity      Walk for exercise Weight goal 200lb        This is a list of the screening recommended for you and due dates:  Health Maintenance  Topic Date Due   HIV Screening  08/25/2022*   Mammogram  09/09/2021   Hemoglobin A1C  01/03/2022   Urine Protein Check  07/05/2022   Pap Smear  08/25/2023   Colon Cancer Screening  06/13/2025   Tetanus Vaccine  11/14/2029   Flu Shot  Completed   COVID-19 Vaccine  Completed   Hepatitis C Screening: USPSTF Recommendation to screen - Ages 18-79 yo.  Completed   Zoster (Shingles) Vaccine  Completed   HPV Vaccine  Aged Out  *Topic was postponed. The date shown is not the original due date.    Advanced directives: on file  Conditions/risks identified: none new  Follow up in one year for your annual wellness visit.   Preventive Care 40-64 Years, Female Preventive care refers to lifestyle choices and visits with your health care provider that can promote health and wellness. What does preventive care include? A yearly physical exam. This is also called an annual well check. Dental exams once or twice a year. Routine eye exams. Ask your health care provider how often you should have your eyes checked. Personal lifestyle choices, including: Daily care of your teeth and  gums. Regular physical activity. Eating a healthy diet. Avoiding tobacco and drug use. Limiting alcohol use. Practicing safe sex. Taking low-dose aspirin daily starting at age 50. Taking vitamin and mineral supplements as recommended by your health care provider. What happens during an annual well check? The services and screenings done by your health care provider during your annual well check will depend on your age, overall health, lifestyle risk factors, and family history of disease. Counseling  Your health care provider may ask you questions about your: Alcohol use. Tobacco use. Drug use. Emotional well-being. Home and relationship well-being. Sexual activity. Eating habits. Work and work Statistician. Method of birth control. Menstrual cycle. Pregnancy history. Screening  You may have the following tests or measurements: Height, weight, and BMI. Blood pressure. Lipid and cholesterol levels. These may be checked every 5 years, or more frequently if you are over 38 years old. Skin check. Lung cancer screening. You may have this screening every year starting at age 75 if you have a 30-pack-year history of smoking and currently smoke or have quit within the past 15 years. Fecal occult blood test (FOBT) of the stool. You may have this test every year starting at age 6. Flexible sigmoidoscopy or colonoscopy. You may have a sigmoidoscopy every 5 years or a colonoscopy every 10 years starting at age 56. Hepatitis C blood test. Hepatitis B blood test. Sexually  transmitted disease (STD) testing. Diabetes screening. This is done by checking your blood sugar (glucose) after you have not eaten for a while (fasting). You may have this done every 1-3 years. Mammogram. This may be done every 1-2 years. Talk to your health care provider about when you should start having regular mammograms. This may depend on whether you have a family history of breast cancer. BRCA-related cancer  screening. This may be done if you have a family history of breast, ovarian, tubal, or peritoneal cancers. Pelvic exam and Pap test. This may be done every 3 years starting at age 33. Starting at age 15, this may be done every 5 years if you have a Pap test in combination with an HPV test. Bone density scan. This is done to screen for osteoporosis. You may have this scan if you are at high risk for osteoporosis. Discuss your test results, treatment options, and if necessary, the need for more tests with your health care provider. Vaccines  Your health care provider may recommend certain vaccines, such as: Influenza vaccine. This is recommended every year. Tetanus, diphtheria, and acellular pertussis (Tdap, Td) vaccine. You may need a Td booster every 10 years. Zoster vaccine. You may need this after age 42. Pneumococcal 13-valent conjugate (PCV13) vaccine. You may need this if you have certain conditions and were not previously vaccinated. Pneumococcal polysaccharide (PPSV23) vaccine. You may need one or two doses if you smoke cigarettes or if you have certain conditions. Talk to your health care provider about which screenings and vaccines you need and how often you need them. This information is not intended to replace advice given to you by your health care provider. Make sure you discuss any questions you have with your health care provider. Document Released: 07/31/2015 Document Revised: 03/23/2016 Document Reviewed: 05/05/2015 Elsevier Interactive Patient Education  2017 [place] Prevention in the Home Falls can cause injuries. They can happen to people of all ages. There are many things you can do to make your home safe and to help prevent falls. What can I do on the outside of my home? Regularly fix the edges of walkways and driveways and fix any cracks. Remove anything that might make you trip as you walk through a door, such as a raised step or threshold. Trim any  bushes or trees on the path to your home. Use bright outdoor lighting. Clear any walking paths of anything that might make someone trip, such as rocks or tools. Regularly check to see if handrails are loose or broken. Make sure that both sides of any steps have handrails. Any raised decks and porches should have guardrails on the edges. Have any leaves, snow, or ice cleared regularly. Use sand or salt on walking paths during winter. Clean up any spills in your garage right away. This includes oil or grease spills. What can I do in the bathroom? Use night lights. Install grab bars by the toilet and in the tub and shower. Do not use towel bars as grab bars. Use non-skid mats or decals in the tub or shower. If you need to sit down in the shower, use a plastic, non-slip stool. Keep the floor dry. Clean up any water that spills on the floor as soon as it happens. Remove soap buildup in the tub or shower regularly. Attach bath mats securely with double-sided non-slip rug tape. Do not have throw rugs and other things on the floor that can make you trip. What  can I do in the bedroom? Use night lights. Make sure that you have a light by your bed that is easy to reach. Do not use any sheets or blankets that are too big for your bed. They should not hang down onto the floor. Have a firm chair that has side arms. You can use this for support while you get dressed. Do not have throw rugs and other things on the floor that can make you trip. What can I do in the kitchen? Clean up any spills right away. Avoid walking on wet floors. Keep items that you use a lot in easy-to-reach places. If you need to reach something above you, use a strong step stool that has a grab bar. Keep electrical cords out of the way. Do not use floor polish or wax that makes floors slippery. If you must use wax, use non-skid floor wax. Do not have throw rugs and other things on the floor that can make you trip. What can I do  with my stairs? Do not leave any items on the stairs. Make sure that there are handrails on both sides of the stairs and use them. Fix handrails that are broken or loose. Make sure that handrails are as long as the stairways. Check any carpeting to make sure that it is firmly attached to the stairs. Fix any carpet that is loose or worn. Avoid having throw rugs at the top or bottom of the stairs. If you do have throw rugs, attach them to the floor with carpet tape. Make sure that you have a light switch at the top of the stairs and the bottom of the stairs. If you do not have them, ask someone to add them for you. What else can I do to help prevent falls? Wear shoes that: Do not have high heels. Have rubber bottoms. Are comfortable and fit you well. Are closed at the toe. Do not wear sandals. If you use a stepladder: Make sure that it is fully opened. Do not climb a closed stepladder. Make sure that both sides of the stepladder are locked into place. Ask someone to hold it for you, if possible. Clearly mark and make sure that you can see: Any grab bars or handrails. First and last steps. Where the edge of each step is. Use tools that help you move around (mobility aids) if they are needed. These include: Canes. Walkers. Scooters. Crutches. Turn on the lights when you go into a dark area. Replace any light bulbs as soon as they burn out. Set up your furniture so you have a clear path. Avoid moving your furniture around. If any of your floors are uneven, fix them. If there are any pets around you, be aware of where they are. Review your medicines with your doctor. Some medicines can make you feel dizzy. This can increase your chance of falling. Ask your doctor what other things that you can do to help prevent falls. This information is not intended to replace advice given to you by your health care provider. Make sure you discuss any questions you have with your health care  provider. Document Released: 04/30/2009 Document Revised: 12/10/2015 Document Reviewed: 08/08/2014 Elsevier Interactive Patient Education  2017 [employer].

## 2021-09-02 ENCOUNTER — Other Ambulatory Visit: Payer: Self-pay | Admitting: Internal Medicine

## 2021-09-02 ENCOUNTER — Ambulatory Visit: Payer: Medicare Other | Admitting: Pharmacist

## 2021-09-02 DIAGNOSIS — E782 Mixed hyperlipidemia: Secondary | ICD-10-CM

## 2021-09-02 DIAGNOSIS — R7303 Prediabetes: Secondary | ICD-10-CM

## 2021-09-02 MED ORDER — AMOXICILLIN 500 MG PO CAPS: 2000.0000 mg | ORAL_CAPSULE | Freq: Once | ORAL | 3 refills | Status: DC

## 2021-09-02 NOTE — Telephone Encounter (Signed)
Duplicate request. See previous note from Catie

## 2021-09-02 NOTE — Patient Instructions (Signed)
[place],   Keep up the great work! Let me know if you need anything in the future.   Catie Darnelle Maffucci, PharmD  Visit Information  Following are the goals we discussed today:  Patient Goals/Self-Care Activities Over the next 90 days, patient will:  - take medications as prescribed collaborate with provider on medication access solutions target a minimum of 150 minutes of moderate intensity exercise weekly engage in dietary modifications by moderating portion sizes          Plan: Goals of care met. Closing CCM case   Catie Darnelle Maffucci, PharmD, [place], CPP Clinical Pharmacist [place] at [facility] [phone]   Please call the care guide team at [phone] if you need to cancel or reschedule your appointment.   Patient verbalizes understanding of instructions and care plan provided today and agrees to view in [place]. Active MyChart status confirmed with patient.

## 2021-09-02 NOTE — Chronic Care Management (AMB) (Signed)
Chronic Care Management CCM Pharmacy Note  09/02/2021 Name:  Lisa Valencia MRN:  [id number] DOB:  08-12-1960  Summary: - Tolerating regimen at this time. Able to afford Ozempic - Up to date on screenings and recommendations  Recommendations/Changes made from today's visit: - Recommended to continue current regimen at this time  Subjective: Lisa Valencia is an 61 y.o. year old female who is a primary patient of Derrel Nip, Aris Everts, MD.  The CCM team was consulted for assistance with disease management and care coordination needs.    Engaged with patient by telephone for follow up visit for pharmacy case management and/or care coordination services.   Objective:  Medications Reviewed Today     Reviewed by De Hollingshead, RPH-CPP (Pharmacist) on 09/02/21 at 1006  Med List Status: <None>   Medication Order Taking? Sig Documenting Provider Last Dose Status Informant  amoxicillin (AMOXIL) 500 MG capsule [id number] Yes TAKE 4 CAPSULES ONE HOUR PRIOR TO [place] Jude Crecencio Mc, MD Taking Active   aspirin EC 81 MG tablet [id number] Yes Take 81 mg by mouth daily. [provider] Taking Active   celecoxib (CELEBREX) 200 MG capsule [id number] Yes Take 1 capsule (200 mg total) by mouth 2 (two) times daily. Crecencio Mc, MD Taking Active   cetirizine (ZYRTEC) 10 MG tablet [id number] Yes Take 10 mg by mouth daily. [provider] Taking Active   conjugated estrogens (PREMARIN) vaginal cream [id number] Yes Place one applicatorful vaginally two to three times weekly Crecencio Mc, MD Taking Active            Med Note De Hollingshead   Wed Nov 25, 2020  8:19 AM) 1-2 times weekly  ergocalciferol (DRISDOL) 1.25 MG (50000 UT) capsule [id number] Yes Take 1 capsule (50,000 Units total) by mouth once a week. Crecencio Mc, MD Taking Active   Insulin Pen Needle (PEN NEEDLES) 32G X 4 MM MISC [id number]  Use as directed Crecencio Mc, MD  Active   methocarbamol (ROBAXIN)  500 MG tablet [id number] Yes Take 1 tablet (500 mg total) by mouth 4 (four) times daily. Crecencio Mc, MD Taking Active   Omega-3 Fatty Acids (FISH OIL) 1000 MG CAPS [id number] Yes Take 1,000 mg by mouth daily.  [provider] Taking Active Self  omeprazole (PRILOSEC) 20 MG capsule [id number] Yes TAKE ONE CAPSULE BY MOUTH TWICE DAILY BEFORE A MEAL Crecencio Mc, MD Taking Active   Semaglutide, 2 MG/DOSE, 8 MG/3ML SOPN [id number] Yes Inject 2 mg into the skin once a week. Crecencio Mc, MD Taking Active   simvastatin (ZOCOR) 20 MG tablet [id number] Yes Take 1 tablet (20 mg total) by mouth at bedtime. Crecencio Mc, MD Taking Active   spironolactone (ALDACTONE) 50 MG tablet [id number] Yes Take 1 tablet (50 mg total) by mouth daily. Crecencio Mc, MD Taking Active            Med Note Nat Christen Sep 02, 2021 10:06 AM) Using PRN  vitamin E 200 UNIT capsule [id number] Yes Take 200 Units by mouth daily. [provider] Taking Active             Pertinent Labs:   Lab Results  Component Value Date   HGBA1C 5.7 07/05/2021   Lab Results  Component Value Date   CHOL 164 07/05/2021   HDL 48.50 07/05/2021   LDLCALC 88 07/05/2021   LDLDIRECT 94.0 08/11/2020   TRIG 137.0 07/05/2021  CHOLHDL 3 07/05/2021   Lab Results  Component Value Date   CREATININE 0.67 07/05/2021   BUN 14 07/05/2021   NA 140 07/05/2021   K 4.6 07/05/2021   CL 103 07/05/2021   CO2 27 07/05/2021    SDOH:  (Social Determinants of Health) assessments and interventions performed:  SDOH Interventions    Flowsheet Row Most Recent Value  SDOH Interventions   Financial Strain Interventions Intervention Not Indicated       CCM Care Plan  Review of patient past medical history, allergies, medications, health status, including review of consultants reports, laboratory and other test data, was performed as part of comprehensive evaluation and provision of chronic care management  services.   Care Plan : Medication Management  Updates made by De Hollingshead, RPH-CPP since 09/02/2021 12:00 AM  Completed 09/02/2021   Problem: Obesity, Chronic Pain, Pre-diabetes, Resolved 09/02/2021     Long-Range Goal: Disease Progression Prevention Completed 09/02/2021  Start Date: 11/25/2020  Recent Progress: On track  Priority: High  Note:   Current Barriers:  Unable to achieve control of weight   Pharmacist Clinical Goal(s):  Over the next 90 days, patient will achieve goal weight loss of 5-10% of baseline body weight through collaboration with PharmD and provider.   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  Health Maintenance Yearly diabetic eye exam: up to date Yearly diabetic foot exam: up to date Urine microalbumin: up to date  Yearly influenza vaccination: up to date  Td/Tdap vaccination: up to date Pneumonia vaccination: up to date  COVID vaccinations: up to date  Shingrix vaccinations: up to date Colonoscopy: up to date  Pre-diabetes, Obesity: Unable to achieve weight loss goals through diet and physical activity alone; current treatment: Ozempic 2 mg weekly  Most recent weight: 270 lbs; baseline weight: 288 lbs; total weight loss 18 lbs (6% from baseline) Previously tried: OTC stimulants for weight loss; hx metformin - would have symptoms of low glucose before supper Current meal patterns: reports more sweets lately, reports she plans to cut back on those; breakfast: yogurt + fruit, sometimes has egg instead. Eggs and oatmeal keep her more full than yogurt. Makes the yogurt herself.  Lunch: sometimes sliced [place], keto sandwich thins; supper: lean proteins, low carb vegetables; taco salads; focused on portion sizes; drinks: unsweet tea, water; crystal lite;  Current exercise: starting to get back into yard work  Praised for goal of reducing sweets, continue focus on lean proteins, fruits and vegetables, higher fiber intake. Encouraged continued focus on increased physical activity.  Reports her insurance did cover Ozempic for $35, and that she can afford this. Recommended to continue current regimen at this time.  Praised for attainment of goal 5% weight loss from baseline to reduce risk of progression of comorbidities.   Lower Extremity Edema/Blood Pressure: Controlled at last office visit; current treatment: spironolactone 50 mg daily- though taking PRN for LEE Reports she stopped for a few days and had leg swelling, so restarted. Taking about every day.  Previously recommended to continue current regimen as above.   Hyperlipidemia: Controlled at goal LDL <100; current treatment: simvastatin 20 mg daily Previously recommended to continue current regimen at this time  Chronic  Back Pain: Worsened per patient report; current regimen: celecoxib 200 mg BID, methocarbamol 500 mg PRN, Hx acetaminophen without benefit. Hx lidocaine patches, Voltaren gel, duloxetine - reports that she had been on duloxetine mostly for antidepressant effect and came off due to wanting to stop antidepressants  Previously discussed use of gabapentin vs duloxetine (or combination moving forward) for chronic back pain. Discuss w/ PCP moving forward.   Allergies: Moderately well controlled per patient report; current regimen: cetirizine 10 mg daily PRN Previously recommended to continue current regimen at this time  Menopausal Symptoms: Well controlled per patient report; current regimen: premarin vaginal cream 1-2 times weekly Previously recommended to continue current regimen at this time.    GERD: Well controlled; omeprazole 20 mg BID Hx reflux esophagitis w/ chronic NSAID use. Appropriate to continue BID PPI at this time.   Supplements: Omega 3 fatty acids 1 g daily, vitamin E 200 units daily (fatty liver disease)   Patient Goals/Self-Care Activities Over the next 90 days, patient will:  - take medications as prescribed collaborate with provider on medication access solutions target a minimum of 150 minutes of moderate intensity exercise weekly engage in dietary modifications by moderating portion sizes       Plan: Goals of care met. Closing CCM case  Catie Darnelle Maffucci, PharmD, [place], [place] Clinical Pharmacist [employer] at [employer] [phone]

## 2021-09-02 NOTE — Telephone Encounter (Signed)
Patient notes she just had a dental visit and they needed her to come back. She wonders if her amoxicillin prescription can be sent with refills so that she doesn't have to reach out every time she has a dental appointment. Routing to PCP

## 2021-09-25 ENCOUNTER — Other Ambulatory Visit: Payer: Self-pay | Admitting: Internal Medicine

## 2021-10-25 ENCOUNTER — Other Ambulatory Visit: Payer: Self-pay | Admitting: Internal Medicine

## 2021-10-25 DIAGNOSIS — Z1231 Encounter for screening mammogram for malignant neoplasm of breast: Secondary | ICD-10-CM

## 2021-11-08 DIAGNOSIS — H35033 Hypertensive retinopathy, bilateral: Secondary | ICD-10-CM | POA: Diagnosis not present

## 2021-11-08 DIAGNOSIS — H2513 Age-related nuclear cataract, bilateral: Secondary | ICD-10-CM | POA: Diagnosis not present

## 2021-12-08 ENCOUNTER — Ambulatory Visit
Admission: RE | Admit: 2021-12-08 | Discharge: 2021-12-08 | Disposition: A | Discharge status: Home / Self Care | Payer: Medicare Other | Source: Ambulatory Visit | Attending: Internal Medicine | Admitting: Internal Medicine

## 2021-12-08 DIAGNOSIS — Z1231 Encounter for screening mammogram for malignant neoplasm of breast: Secondary | ICD-10-CM | POA: Diagnosis not present

## 2021-12-09 ENCOUNTER — Other Ambulatory Visit: Payer: Self-pay | Admitting: Internal Medicine

## 2021-12-09 DIAGNOSIS — R928 Other abnormal and inconclusive findings on diagnostic imaging of breast: Secondary | ICD-10-CM

## 2021-12-09 DIAGNOSIS — N6489 Other specified disorders of breast: Secondary | ICD-10-CM

## 2021-12-20 ENCOUNTER — Encounter: Payer: Self-pay | Admitting: Internal Medicine

## 2021-12-28 ENCOUNTER — Other Ambulatory Visit (INDEPENDENT_AMBULATORY_CARE_PROVIDER_SITE_OTHER): Payer: Medicare Other

## 2021-12-28 DIAGNOSIS — E782 Mixed hyperlipidemia: Secondary | ICD-10-CM | POA: Diagnosis not present

## 2021-12-28 DIAGNOSIS — R7303 Prediabetes: Secondary | ICD-10-CM | POA: Diagnosis not present

## 2021-12-28 LAB — COMPREHENSIVE METABOLIC PANEL
ALT: 16 U/L (ref 0–35)
AST: 14 U/L (ref 0–37)
Albumin: 4.4 g/dL (ref 3.5–5.2)
Alkaline Phosphatase: 48 U/L (ref 39–117)
BUN: 14 mg/dL (ref 6–23)
CO2: 30 mEq/L (ref 19–32)
Calcium: 10 mg/dL (ref 8.4–10.5)
Chloride: 102 mEq/L (ref 96–112)
Creatinine, Ser: 0.79 mg/dL (ref 0.40–1.20)
GFR: 80.98 mL/min (ref >60.00)
Glucose, Bld: 92 mg/dL (ref 70–99)
Potassium: 4.3 mEq/L (ref 3.5–5.1)
Sodium: 140 mEq/L (ref 135–145)
Total Bilirubin: 0.4 mg/dL (ref 0.2–1.2)
Total Protein: 7.1 g/dL (ref 6.0–8.3)

## 2021-12-28 LAB — HEMOGLOBIN A1C: Hgb A1c MFr Bld: 5.9 % (ref 4.6–6.5)

## 2021-12-29 LAB — LIPID PANEL W/REFLEX DIRECT LDL
Cholesterol: 204 mg/dL — ABNORMAL HIGH (ref <200)
HDL: 52 mg/dL (ref >=50)
LDL Cholesterol (Calc): 119 mg/dL (calc) — ABNORMAL HIGH
Non-HDL Cholesterol (Calc): 152 mg/dL (calc) — ABNORMAL HIGH (ref <130)
Total CHOL/HDL Ratio: 3.9 (calc) (ref <5.0)
Triglycerides: 213 mg/dL — ABNORMAL HIGH (ref <150)

## 2022-01-03 ENCOUNTER — Ambulatory Visit (INDEPENDENT_AMBULATORY_CARE_PROVIDER_SITE_OTHER): Payer: Medicare Other | Admitting: Internal Medicine

## 2022-01-03 ENCOUNTER — Encounter: Payer: Self-pay | Admitting: Internal Medicine

## 2022-01-03 VITALS — BP 138/84 | HR 82 | Temp 97.8°F | Ht 70.0 in | Wt 276.4 lb

## 2022-01-03 DIAGNOSIS — M5431 Sciatica, right side: Secondary | ICD-10-CM

## 2022-01-03 DIAGNOSIS — R7303 Prediabetes: Secondary | ICD-10-CM | POA: Diagnosis not present

## 2022-01-03 DIAGNOSIS — E782 Mixed hyperlipidemia: Secondary | ICD-10-CM | POA: Diagnosis not present

## 2022-01-03 DIAGNOSIS — R300 Dysuria: Secondary | ICD-10-CM | POA: Insufficient documentation

## 2022-01-03 DIAGNOSIS — Z720 Tobacco use: Secondary | ICD-10-CM

## 2022-01-03 DIAGNOSIS — K760 Fatty (change of) liver, not elsewhere classified: Secondary | ICD-10-CM

## 2022-01-03 LAB — POCT URINALYSIS DIPSTICK
Bilirubin, UA: NEGATIVE
Blood, UA: NEGATIVE
Glucose, UA: NEGATIVE
Ketones, UA: POSITIVE
Nitrite, UA: NEGATIVE
Protein, UA: POSITIVE — AB
Spec Grav, UA: 1.01 (ref 1.010–1.025)
Urobilinogen, UA: 1 E.U./dL
pH, UA: 8 (ref 5.0–8.0)

## 2022-01-03 NOTE — Assessment & Plan Note (Signed)
Intermittent for several weeks.  UA is abnormal;  She is willing to wait for culture results

## 2022-01-03 NOTE — Progress Notes (Signed)
Subjective:  Patient ID: Lisa Valencia, female    DOB: 08/01/1960  Age: 61 y.o. MRN: [id number]  CC: The primary encounter diagnosis was Dysuria. Diagnoses of Prediabetes, Mixed hyperlipidemia, Morbid obesity ([place]), Hepatic steatosis, Sciatica of right side, and Tobacco abuse were also pertinent to this visit.   HPI Lisa Valencia presents for  Chief Complaint  Patient presents with   Follow-up    6 month followup   1) Dysuria for several weeks,  intermittent . She has been increasing her water intake but having nocturia and stress incontinence   Dipstick UA positive leukocytes  2) morbid obesity with prediabetes:  She  had used ozempic and lost 18 lbs over one year, but she  had plateaued at the max dose and recently her insurance  stopped paying for it.  Since stopping the medication , she has regained 6 lbs .  Did not tolerate metformin.  Going to try an app   3) white coat HTN:  home readings have been < 140/80 on weekly checks. Taking spironolactone   4) Prediabetes:  reviewed diet ,  eating habits  last several years of A1c 's/    5) abnormal right breast mammogram .  Diagnostic mammogram ordered for next week.    Outpatient Medications Prior to Visit  Medication Sig Dispense Refill   aspirin EC 81 MG tablet Take 81 mg by mouth daily.     celecoxib (CELEBREX) 200 MG capsule TAKE 1 CAPSULE BY MOUTH 2 TIMES DAILY 180 capsule 3   cetirizine (ZYRTEC) 10 MG tablet Take 10 mg by mouth daily.     conjugated estrogens (PREMARIN) vaginal cream Place one applicatorful vaginally two to three times weekly 90 g 3   ergocalciferol (DRISDOL) 1.25 MG (50000 UT) capsule Take 1 capsule (50,000 Units total) by mouth once a week. 12 capsule 0   methocarbamol (ROBAXIN) 500 MG tablet Take 1 tablet (500 mg total) by mouth 4 (four) times daily. 90 tablet 0   Omega-3 Fatty Acids (FISH OIL) 1000 MG CAPS Take 1,000 mg by mouth daily.      omeprazole (PRILOSEC) 20 MG capsule TAKE ONE CAPSULE BY MOUTH  TWICE DAILY BEFORE A MEAL 180 capsule 3   simvastatin (ZOCOR) 20 MG tablet TAKE 1 TABLET BY MOUTH AT BEDTIME 90 tablet 3   spironolactone (ALDACTONE) 50 MG tablet TAKE 1 TABLET BY MOUTH DAILY. GENERIC EQUIVALENT FOR ALDACTONE 90 tablet 3   vitamin E 200 UNIT capsule Take 200 Units by mouth daily.     Insulin Pen Needle (PEN NEEDLES) 32G X 4 MM MISC Use as directed (Patient not taking: Reported on 01/03/2022) 100 each 1   Semaglutide, 2 MG/DOSE, 8 MG/3ML SOPN Inject 2 mg into the skin once a week. (Patient not taking: Reported on 01/03/2022) 3 mL 2   No facility-administered medications prior to visit.    Review of Systems;  Patient denies headache, fevers, malaise, unintentional weight loss, skin rash, eye pain, sinus congestion and sinus pain, sore throat, dysphagia,  hemoptysis , cough, dyspnea, wheezing, chest pain, palpitations, orthopnea, edema, abdominal pain, nausea, melena, diarrhea, constipation, flank pain, dysuria, hematuria, urinary  Frequency, nocturia, numbness, tingling, seizures,  Focal weakness, Loss of consciousness,  Tremor, insomnia, depression, anxiety, and suicidal ideation.      Objective:  BP 138/84 (BP Location: Left Arm, Patient Position: Sitting, Cuff Size: Normal)   Pulse 82   Temp 97.8 F (36.6 C) (Oral)   Ht '5\' 10"'$  (1.778 m)  Wt 276 lb 6.4 oz (125.4 kg)   SpO2 98%   BMI 39.66 kg/m   BP Readings from Last 3 Encounters:  01/03/22 138/84  07/05/21 128/78  03/04/21 120/82    Wt Readings from Last 3 Encounters:  01/03/22 276 lb 6.4 oz (125.4 kg)  08/25/21 270 lb (122.5 kg)  07/05/21 270 lb 12.8 oz (122.8 kg)    General appearance: alert, cooperative and appears stated age Ears: normal TM's and external ear canals both ears Throat: lips, mucosa, and tongue normal; teeth and gums normal Neck: no adenopathy, no carotid bruit, supple, symmetrical, trachea midline and thyroid not enlarged, symmetric, no tenderness/mass/nodules Back: symmetric, no  curvature. ROM normal. No CVA tenderness. Lungs: clear to auscultation bilaterally Heart: regular rate and rhythm, S1, S2 normal, no murmur, click, rub or gallop Abdomen: soft, non-tender; bowel sounds normal; no masses,  no organomegaly Pulses: 2+ and symmetric Skin: Skin color, texture, turgor normal. No rashes or lesions Lymph nodes: Cervical, supraclavicular, and axillary nodes normal.  Lab Results  Component Value Date   HGBA1C 5.9 12/28/2021   HGBA1C 5.7 07/05/2021   HGBA1C 5.6 12/21/2020    Lab Results  Component Value Date   CREATININE 0.79 12/28/2021   CREATININE 0.67 07/05/2021   CREATININE 0.76 12/21/2020    Lab Results  Component Value Date   WBC 4.6 07/03/2018   HGB 13.8 07/03/2018   HCT 41.9 07/03/2018   PLT 198.0 07/03/2018   GLUCOSE 92 12/28/2021   CHOL 204 (H) 12/28/2021   TRIG 213 (H) 12/28/2021   HDL 52 12/28/2021   LDLDIRECT 94.0 08/11/2020   LDLCALC 119 (H) 12/28/2021   ALT 16 12/28/2021   AST 14 12/28/2021   NA 140 12/28/2021   K 4.3 12/28/2021   CL 102 12/28/2021   CREATININE 0.79 12/28/2021   BUN 14 12/28/2021   CO2 30 12/28/2021   TSH 0.76 02/01/2016   INR 0.90 04/19/2018   HGBA1C 5.9 12/28/2021   MICROALBUR 0.9 07/05/2021    MM 3D SCREEN BREAST BILATERAL  Result Date: 12/09/2021 CLINICAL DATA:  Screening. EXAM: DIGITAL SCREENING BILATERAL MAMMOGRAM WITH TOMOSYNTHESIS AND CAD TECHNIQUE: Bilateral screening digital craniocaudal and mediolateral oblique mammograms were obtained. Bilateral screening digital breast tomosynthesis was performed. The images were evaluated with computer-aided detection. COMPARISON:  Previous exam(s). ACR Breast Density Category a: The breast tissue is almost entirely fatty. FINDINGS: In the right breast, a possible focal asymmetry warrants further evaluation. In the left breast, no findings suspicious for malignancy. IMPRESSION: Further evaluation is suggested for possible focal asymmetry in the right breast.  RECOMMENDATION: Diagnostic mammogram and possibly ultrasound of the right breast. (Code:[id number]) The patient will be contacted regarding the findings, and additional imaging will be scheduled. BI-RADS CATEGORY  0: Incomplete. Need additional imaging evaluation and/or prior mammograms for comparison. Electronically Signed   By: Valentino Saxon M.D.   On: 12/09/2021 13:52    Assessment & Plan:   Problem List Items Addressed This Visit     Morbid obesity ([place])    No longer treated with ozempic due to noncoverage.  She had plateaued at the max dose.  Discussed using  BFL to increase satiety and a  KETO app to help guide food choices.       Prediabetes    a1c has risen slightly since stopping Ozempic and gaining weight  Lab Results  Component Value Date   HGBA1C 5.9 12/28/2021         Relevant Orders   Comprehensive metabolic panel  Hemoglobin A1c   Tobacco abuse    Did not tolerate Chantix due to persistent nausea and weight gain . Prior trial of wellbutrin not tolerated either . encouraaged to weekly taper  use by one reducing one cig per day         Hyperlipidemia, unspecified   Relevant Orders   Lipid Profile   Direct LDL   Sciatica    No longer managed with oxycodone.  Using celebrex, tylenol and MR's       Hepatic steatosis    Managed with statin and low GI diet LFTs are normal  Lab Results  Component Value Date   ALT 16 12/28/2021   AST 14 12/28/2021   ALKPHOS 48 12/28/2021   BILITOT 0.4 12/28/2021         Dysuria - Primary    Intermittent for several weeks.  UA is abnormal;  She is willing to wait for culture results      Relevant Orders   POCT Urinalysis Dipstick (Completed)   Urine Microscopic Only   Urine Culture    I spent a total of  40 minutes with this patient in a face to face visit on the date of this encounter reviewing the last office visit with me, ,  her diet and eating habits, home blood pressure readings ,  most recent imaging study  ,   and post visit ordering of testing and therapeutics.    Follow-up: Return in about 6 months (around 07/05/2022).   Crecencio Mc, MD

## 2022-01-03 NOTE — Assessment & Plan Note (Signed)
Did not tolerate Chantix due to persistent nausea and weight gain . Prior trial of wellbutrin not tolerated either . encouraaged to weekly taper  use by one reducing one cig per day    

## 2022-01-03 NOTE — Assessment & Plan Note (Signed)
No longer managed with oxycodone.  Using celebrex, tylenol and MR's

## 2022-01-03 NOTE — Assessment & Plan Note (Signed)
Managed with statin and low GI diet LFTs are normal  Lab Results  Component Value Date   ALT 16 12/28/2021   AST 14 12/28/2021   ALKPHOS 48 12/28/2021   BILITOT 0.4 12/28/2021

## 2022-01-03 NOTE — Assessment & Plan Note (Signed)
a1c has risen slightly since stopping Ozempic and gaining weight  Lab Results  Component Value Date   HGBA1C 5.9 12/28/2021

## 2022-01-03 NOTE — Assessment & Plan Note (Signed)
No longer treated with ozempic due to noncoverage.  She had plateaued at the max dose.  Discussed using  BFL to increase satiety and a  KETO app to help guide food choices.

## 2022-01-04 LAB — URINE CULTURE
MICRO NUMBER:: 13542464
SPECIMEN QUALITY:: ADEQUATE

## 2022-01-06 ENCOUNTER — Encounter: Payer: Self-pay | Admitting: Internal Medicine

## 2022-01-07 ENCOUNTER — Other Ambulatory Visit: Payer: Self-pay | Admitting: Internal Medicine

## 2022-01-07 ENCOUNTER — Ambulatory Visit
Admission: RE | Admit: 2022-01-07 | Discharge: 2022-01-07 | Disposition: A | Discharge status: Home / Self Care | Payer: Medicare Other | Source: Ambulatory Visit | Attending: Internal Medicine | Admitting: Internal Medicine

## 2022-01-07 DIAGNOSIS — R928 Other abnormal and inconclusive findings on diagnostic imaging of breast: Secondary | ICD-10-CM | POA: Insufficient documentation

## 2022-01-07 DIAGNOSIS — N6489 Other specified disorders of breast: Secondary | ICD-10-CM

## 2022-01-07 DIAGNOSIS — N63 Unspecified lump in unspecified breast: Secondary | ICD-10-CM

## 2022-01-07 DIAGNOSIS — R922 Inconclusive mammogram: Secondary | ICD-10-CM | POA: Diagnosis not present

## 2022-01-08 ENCOUNTER — Encounter: Payer: Self-pay | Admitting: Internal Medicine

## 2022-01-08 DIAGNOSIS — N6310 Unspecified lump in the right breast, unspecified quadrant: Secondary | ICD-10-CM | POA: Insufficient documentation

## 2022-01-10 ENCOUNTER — Encounter: Payer: Self-pay | Admitting: Internal Medicine

## 2022-01-26 ENCOUNTER — Ambulatory Visit
Admission: RE | Admit: 2022-01-26 | Discharge: 2022-01-26 | Disposition: A | Discharge status: Home / Self Care | Payer: Medicare Other | Source: Ambulatory Visit | Attending: Internal Medicine | Admitting: Internal Medicine

## 2022-01-26 DIAGNOSIS — R928 Other abnormal and inconclusive findings on diagnostic imaging of breast: Secondary | ICD-10-CM | POA: Diagnosis not present

## 2022-01-26 DIAGNOSIS — N6311 Unspecified lump in the right breast, upper outer quadrant: Secondary | ICD-10-CM | POA: Insufficient documentation

## 2022-01-26 DIAGNOSIS — N6081 Other benign mammary dysplasias of right breast: Secondary | ICD-10-CM | POA: Diagnosis not present

## 2022-01-26 DIAGNOSIS — N6315 Unspecified lump in the right breast, overlapping quadrants: Secondary | ICD-10-CM | POA: Diagnosis not present

## 2022-01-26 DIAGNOSIS — N63 Unspecified lump in unspecified breast: Secondary | ICD-10-CM | POA: Diagnosis not present

## 2022-01-26 HISTORY — PX: BREAST BIOPSY: SHX20

## 2022-01-27 ENCOUNTER — Encounter: Payer: Self-pay | Admitting: Internal Medicine

## 2022-01-27 LAB — SURGICAL PATHOLOGY

## 2022-01-31 ENCOUNTER — Telehealth: Payer: Self-pay

## 2022-01-31 NOTE — Telephone Encounter (Signed)
Called and notified Ms. Dyches with her appointment to see Dr. Bary Castilla 02/10/22 at 1345.

## 2022-02-10 DIAGNOSIS — D241 Benign neoplasm of right breast: Secondary | ICD-10-CM | POA: Diagnosis not present

## 2022-04-07 ENCOUNTER — Other Ambulatory Visit: Payer: Self-pay | Admitting: Internal Medicine

## 2022-04-07 NOTE — Telephone Encounter (Signed)
Uses for Dental Procedure  Last OV 01/03/22  Next OV  06/02/22

## 2022-04-28 DIAGNOSIS — Z96643 Presence of artificial hip joint, bilateral: Secondary | ICD-10-CM | POA: Diagnosis not present

## 2022-06-02 ENCOUNTER — Encounter: Payer: Self-pay | Admitting: Internal Medicine

## 2022-06-02 ENCOUNTER — Ambulatory Visit (INDEPENDENT_AMBULATORY_CARE_PROVIDER_SITE_OTHER): Payer: Medicare Other | Admitting: Internal Medicine

## 2022-06-02 VITALS — BP 134/80 | HR 86 | Temp 97.9°F | Ht 70.0 in | Wt 282.2 lb

## 2022-06-02 DIAGNOSIS — N6311 Unspecified lump in the right breast, upper outer quadrant: Secondary | ICD-10-CM

## 2022-06-02 DIAGNOSIS — E559 Vitamin D deficiency, unspecified: Secondary | ICD-10-CM

## 2022-06-02 DIAGNOSIS — E782 Mixed hyperlipidemia: Secondary | ICD-10-CM

## 2022-06-02 DIAGNOSIS — Z23 Encounter for immunization: Secondary | ICD-10-CM

## 2022-06-02 DIAGNOSIS — Z720 Tobacco use: Secondary | ICD-10-CM

## 2022-06-02 DIAGNOSIS — K760 Fatty (change of) liver, not elsewhere classified: Secondary | ICD-10-CM

## 2022-06-02 DIAGNOSIS — D125 Benign neoplasm of sigmoid colon: Secondary | ICD-10-CM

## 2022-06-02 DIAGNOSIS — R7303 Prediabetes: Secondary | ICD-10-CM

## 2022-06-02 LAB — LIPID PANEL
Cholesterol: 194 mg/dL (ref 0–200)
HDL: 42.5 mg/dL (ref >39.00)
NonHDL: 151.97
Total CHOL/HDL Ratio: 5
Triglycerides: 207 mg/dL — ABNORMAL HIGH (ref 0.0–149.0)
VLDL: 41.4 mg/dL — ABNORMAL HIGH (ref 0.0–40.0)

## 2022-06-02 LAB — COMPREHENSIVE METABOLIC PANEL
ALT: 22 U/L (ref 0–35)
AST: 17 U/L (ref 0–37)
Albumin: 4.4 g/dL (ref 3.5–5.2)
Alkaline Phosphatase: 51 U/L (ref 39–117)
BUN: 13 mg/dL (ref 6–23)
CO2: 28 mEq/L (ref 19–32)
Calcium: 9.5 mg/dL (ref 8.4–10.5)
Chloride: 102 mEq/L (ref 96–112)
Creatinine, Ser: 0.66 mg/dL (ref 0.40–1.20)
GFR: 94.68 mL/min (ref >60.00)
Glucose, Bld: 95 mg/dL (ref 70–99)
Potassium: 4.2 mEq/L (ref 3.5–5.1)
Sodium: 140 mEq/L (ref 135–145)
Total Bilirubin: 0.5 mg/dL (ref 0.2–1.2)
Total Protein: 7.2 g/dL (ref 6.0–8.3)

## 2022-06-02 LAB — VITAMIN D 25 HYDROXY (VIT D DEFICIENCY, FRACTURES): VITD: 27.05 ng/mL — ABNORMAL LOW (ref 30.00–100.00)

## 2022-06-02 LAB — HEMOGLOBIN A1C: Hgb A1c MFr Bld: 6.2 % (ref 4.6–6.5)

## 2022-06-02 LAB — LDL CHOLESTEROL, DIRECT: Direct LDL: 128 mg/dL

## 2022-06-02 MED ORDER — AMOXICILLIN 500 MG PO CAPS: ORAL_CAPSULE | ORAL | 3 refills | Status: DC

## 2022-06-02 MED ORDER — OMEPRAZOLE 20 MG PO CPDR: DELAYED_RELEASE_CAPSULE | ORAL | 3 refills | Status: DC

## 2022-06-02 MED ORDER — METHOCARBAMOL 500 MG PO TABS: 500.0000 mg | ORAL_TABLET | Freq: Four times a day (QID) | ORAL | 0 refills | Status: DC

## 2022-06-02 NOTE — Assessment & Plan Note (Signed)
Did not tolerate Chantix due to persistent nausea and weight gain . Prior trial of wellbutrin not tolerated either . encouraaged to weekly taper  use by one reducing one cig per day

## 2022-06-02 NOTE — Progress Notes (Signed)
Subjective:  Patient ID: Lisa Valencia, female    DOB: 07-Nov-1960  Age: 61 y.o. MRN: [id number]  CC: The primary encounter diagnosis was Vitamin D deficiency. Diagnoses of Mixed hyperlipidemia, Prediabetes, Mass of upper outer quadrant of right breast, Benign neoplasm of sigmoid colon, Morbid obesity ([place]), Need for immunization against influenza, Tobacco abuse, and Hepatic steatosis were also pertinent to this visit.   HPI Lisa Valencia presents for  Chief Complaint  Patient presents with   Follow-up    6 month follow up   1) Prediabetes . Obesity,. Hyperlipidemia:  weight is up 6 lbs .    Wegovy not covered by insurance.  Hated nutrisystem due to poor taste of food.  Has reduced portion size and limiting carbs when wt reached 287 and has lost 5 lbs  in the last 3 weeks .  [place].   Lost 18 lbs on ozempic during prior period of use (6 months )   2) s/p bilateral hip replacements   needs antibiotics prescribed more frequently for pre procedure prophylaxis.      3)  INCREASED low back pain due to activities in yard and house .  Taking robaxin prn.  Taking celebrex ,  has added 2 XS tylenol with no change in pain. Has tried adding tramadol, no significant improvement.  Using salon pas with lidocaine .  Improves with rest. Has had 2 ESI in the past  in [place] with minimal transient relief   4) tobacco abuse:  up to 1/2 pack daily   Outpatient Medications Prior to Visit  Medication Sig Dispense Refill   aspirin EC 81 MG tablet Take 81 mg by mouth daily.     celecoxib (CELEBREX) 200 MG capsule TAKE 1 CAPSULE BY MOUTH 2 TIMES DAILY 180 capsule 3   cetirizine (ZYRTEC) 10 MG tablet Take 10 mg by mouth daily.     conjugated estrogens (PREMARIN) vaginal cream Place one applicatorful vaginally two to three times weekly 90 g 3   ergocalciferol (DRISDOL) 1.25 MG (50000 UT) capsule Take 1 capsule (50,000 Units total) by mouth once a week. 12  capsule 0   Omega-3 Fatty Acids (FISH OIL) 1000 MG CAPS Take 1,000 mg by mouth daily.      simvastatin (ZOCOR) 20 MG tablet TAKE 1 TABLET BY MOUTH AT BEDTIME 90 tablet 3   spironolactone (ALDACTONE) 50 MG tablet TAKE 1 TABLET BY MOUTH DAILY. GENERIC EQUIVALENT FOR ALDACTONE 90 tablet 3   vitamin E 200 UNIT capsule Take 200 Units by mouth daily.     amoxicillin (AMOXIL) 500 MG capsule TAKE 4 CAPSULES ONE HOUR PRIOR TO DENTALPROCEDURE 4 capsule 3   methocarbamol (ROBAXIN) 500 MG tablet Take 1 tablet (500 mg total) by mouth 4 (four) times daily. 90 tablet 0   omeprazole (PRILOSEC) 20 MG capsule TAKE ONE CAPSULE BY MOUTH TWICE DAILY BEFORE A MEAL 180 capsule 3   Insulin Pen Needle (PEN NEEDLES) 32G X 4 MM MISC Use as directed (Patient not taking: Reported on 01/03/2022) 100 each 1   No facility-administered medications prior to visit.    Review of Systems;  Patient denies headache, fevers, malaise, unintentional weight loss, skin rash, eye pain, sinus congestion and sinus pain, sore throat, dysphagia,  hemoptysis , cough, dyspnea, wheezing, chest pain, palpitations, orthopnea, edema, abdominal pain, nausea, melena, diarrhea, constipation, flank pain, dysuria, hematuria, urinary  Frequency, nocturia, numbness, tingling, seizures,  Focal weakness, Loss of  consciousness,  Tremor, insomnia, depression, anxiety, and suicidal ideation.      Objective:  BP 134/80 (BP Location: Left Arm, Patient Position: Sitting, Cuff Size: Large)   Pulse 86   Temp 97.9 F (36.6 C) (Oral)   Ht _0  (1.778 m)   Wt 282 lb 3.2 oz (128 kg)   SpO2 97%   BMI 40.49 kg/m   BP Readings from Last 3 Encounters:  06/02/22 134/80  01/03/22 138/84  07/05/21 128/78    Wt Readings from Last 3 Encounters:  06/02/22 282 lb 3.2 oz (128 kg)  01/03/22 276 lb 6.4 oz (125.4 kg)  08/25/21 270 lb (122.5 kg)    General appearance: alert, cooperative and appears stated age Ears: normal TM's and external ear canals both  ears Throat: lips, mucosa, and tongue normal; teeth and gums normal Neck: no adenopathy, no carotid bruit, supple, symmetrical, trachea midline and thyroid not enlarged, symmetric, no tenderness/mass/nodules Back: symmetric, no curvature. ROM normal. No CVA tenderness. Lungs: clear to auscultation bilaterally Heart: regular rate and rhythm, S1, S2 normal, no murmur, click, rub or gallop Abdomen: soft, non-tender; bowel sounds normal; no masses,  no organomegaly Pulses: 2+ and symmetric Skin: Skin color, texture, turgor normal. No rashes or lesions Lymph nodes: Cervical, supraclavicular, and axillary nodes normal. Neuro:  awake and interactive with normal mood and affect. Higher cortical functions are normal. Speech is clear without word-finding difficulty or dysarthria. Extraocular movements are intact. Visual fields of both eyes are grossly intact. Sensation to light touch is grossly intact bilaterally of upper and lower extremities. Motor examination shows 4+/5 symmetric hand grip and upper extremity and 5/5 lower extremity strength. There is no pronation or drift. Gait is non-ataxic   Lab Results  Component Value Date   HGBA1C 6.2 06/02/2022   HGBA1C 5.9 12/28/2021   HGBA1C 5.7 07/05/2021    Lab Results  Component Value Date   CREATININE 0.66 06/02/2022   CREATININE 0.79 12/28/2021   CREATININE 0.67 07/05/2021    Lab Results  Component Value Date   WBC 4.6 07/03/2018   HGB 13.8 07/03/2018   HCT 41.9 07/03/2018   PLT 198.0 07/03/2018   GLUCOSE 95 06/02/2022   CHOL 194 06/02/2022   TRIG 207.0 (H) 06/02/2022   HDL 42.50 06/02/2022   LDLDIRECT 128.0 06/02/2022   LDLCALC 119 (H) 12/28/2021   ALT 22 06/02/2022   AST 17 06/02/2022   NA 140 06/02/2022   K 4.2 06/02/2022   CL 102 06/02/2022   CREATININE 0.66 06/02/2022   BUN 13 06/02/2022   CO2 28 06/02/2022   TSH 0.76 02/01/2016   INR 0.90 04/19/2018   HGBA1C 6.2 06/02/2022   MICROALBUR 0.9 07/05/2021    [place] RT BREAST BX  W LOC DEV 1ST LESION IMG BX SPEC US GUIDE  Addendum Date: 01/28/2022   ADDENDUM REPORT: 01/28/2022 13:43 ADDENDUM: PATHOLOGY revealed: A. BREAST, RIGHT AT 9:00, 6 CM FROM THE NIPPLE; ULTRASOUND-GUIDED CORE NEEDLE BIOPSY: - FRAGMENTS OF BENIGN INTRADUCTAL PAPILLOMA WITH FOCAL USUAL DUCTAL HYPERPLASIA. - NO DEFINITE EVIDENCE OF ATYPICAL PROLIFERATIVE BREAST DISEASE. Comment: Although definite atypia is not identified in the current sampling, the presence of an un-sampled atypical component cannot be entirely excluded. Definitive classification would require complete evaluation of the lesion. Pathology results are CONCORDANT with imaging findings, per Dr. Lillia Mountain with excision recommended. Pathology results and recommendations were discussed with patient via telephone on 01/27/2022. Patient reported biopsy site doing well with no adverse symptoms, and only slight tenderness at the  site. Post biopsy care instructions were reviewed, questions were answered and my direct phone number was provided. Patient was instructed to call [facility] for any additional questions or concerns related to biopsy site. RECOMMENDATIONS: Surgical consultation for possible excision. Request for surgical consultation relayed to Casper Harrison RN at [facility] by Electa Sniff RN on 01/28/2022. Pathology results reported by Electa Sniff RN on 01/28/2022. Electronically Signed   By: Lillia Mountain M.D.   On: 01/28/2022 13:43   Result Date: 01/28/2022 CLINICAL DATA:  Right breast mass. EXAM: ULTRASOUND GUIDED RIGHT BREAST CORE NEEDLE BIOPSY COMPARISON:  Previous exam(s). PROCEDURE: I met with the patient and we discussed the procedure of ultrasound-guided biopsy, including benefits and alternatives. We discussed the high likelihood of a successful procedure. We discussed the risks of the procedure, including infection, bleeding, tissue injury, clip migration, and inadequate sampling. Informed written consent was given.  The usual time-out protocol was performed immediately prior to the procedure. Lesion quadrant: 9 o'clock Using sterile technique and 1% Lidocaine as local anesthetic, under direct ultrasound visualization, a 14 gauge spring-loaded device was used to perform biopsy of a mass in the 9 o'clock region of the right breast using a lateral to medial approach. At the conclusion of the procedure venous shaped tissue marker clip was deployed into the biopsy cavity. Follow up 2 view mammogram was performed and dictated separately. IMPRESSION: Ultrasound guided biopsy of the right breast. No apparent complications. Electronically Signed: By: Lillia Mountain M.D. On: 01/26/2022 13:44  MM CLIP PLACEMENT RIGHT  Result Date: 01/26/2022 CLINICAL DATA:  Status post ultrasound-guided core biopsy of a right breast mass. EXAM: 3D DIAGNOSTIC RIGHT MAMMOGRAM POST ULTRASOUND BIOPSY COMPARISON:  Previous exam(s). FINDINGS: 3D Mammographic images were obtained following ultrasound guided biopsy of . The biopsy marking clip is in expected position at the site of biopsy. IMPRESSION: Appropriate positioning of the venous shaped biopsy marking clip at the site of biopsy in the lateral slightly upper aspect of the right breast. Final Assessment: Post Procedure Mammograms for Marker Placement Electronically Signed   By: Lillia Mountain M.D.   On: 01/26/2022 13:53   Assessment & Plan:   Problem List Items Addressed This Visit     Benign neoplasm of sigmoid colon   Breast mass, right    Biopsy was benign but papilloma required surgical consult for excision.  Seen by Byrnett.  Ultrasound repeated and size was too small to be of concern.  Plans for diagnostic bilateral mammogram in May 2024 followed by surgical consult if needed        Hepatic steatosis    Managed with statin and low GI diet LFTs are normal  Lab Results  Component Value Date   ALT 22 06/02/2022   AST 17 06/02/2022   ALKPHOS 51 06/02/2022   BILITOT 0.5 06/02/2022         Hyperlipidemia, unspecified   Morbid obesity ([place])    Her obeisty is aggravating her back pain.  We Reviewed prior successful weight loss trials.  Lost weight with ozempic , and would like to resume it.  Pharmacy consult ordered      Relevant Orders   AMB Referral to Pharmacy Medication Management   Prediabetes    a1c has risen slightly since stopping Ozempic and gaining weight  Lab Results  Component Value Date   HGBA1C 6.2 06/02/2022        Relevant Orders   AMB Referral to Pharmacy Medication Management   Tobacco abuse  Did not tolerate Chantix due to persistent nausea and weight gain . Prior trial of wellbutrin not tolerated either . encouraaged to weekly taper  use by one reducing one cig per day         Vitamin D deficiency - Primary   Relevant Orders   Vitamin D (25 hydroxy) (Completed)   Other Visit Diagnoses     Need for immunization against influenza       Relevant Orders   Flu Vaccine QUAD 46moIM (Fluarix, Fluzone & Alfiuria Quad PF) (Completed)       I spent a total of  30 minutes with this patient in a face to face visit on the date of this encounter reviewing the last office visit with me,   LAST visit with orthopedics.  diet and exercise habits, home blood pressure /bloOd sugar readings,  and post visit ordering of testing and therapeutics.    Follow-up: Return in about 6 months (around 12/01/2022).   TCrecencio Mc MD

## 2022-06-02 NOTE — Assessment & Plan Note (Signed)
Her obeisty is aggravating her back pain.  We Reviewed prior successful weight loss trials.  Lost weight with ozempic , and would like to resume it.  Pharmacy consult ordered

## 2022-06-02 NOTE — Assessment & Plan Note (Signed)
a1c has risen slightly since stopping Ozempic and gaining weight  Lab Results  Component Value Date   HGBA1C 6.2 06/02/2022

## 2022-06-02 NOTE — Patient Instructions (Addendum)
I'm going to see if pharmacy can get you back on ozempic   If not successful,  consider [employer]avia  diet   the [employer] works for anybody.  The diet utilizes 5 feedings per day which are 100 calorie meals chosen and purchased from their menu,  and one "lean and green meal"  that you supply on your own for dinner.  The cost , per my patients,  is $400 month for the food you order through them.  (one month , 30 x 5 = 150 meals)  . This would double if both of you  and your husband did it.  Many people are finding the cost prohibitive and creating their own menu from locally available prepared foods,, shakes and protein bars.        If you are over 60 or have a history of tobacco abuse or lung disease,  the RSV vaccine is recommended. The office can provide the vaccine to anyone under the age of 15 who is not on Medicare.  [facility], [facility] and [facility] currently have the vaccine in stock .  Check with BCBS to see if it is covered

## 2022-06-02 NOTE — Assessment & Plan Note (Signed)
Managed with statin and low GI diet LFTs are normal  Lab Results  Component Value Date   ALT 22 06/02/2022   AST 17 06/02/2022   ALKPHOS 51 06/02/2022   BILITOT 0.5 06/02/2022

## 2022-06-02 NOTE — Assessment & Plan Note (Signed)
Biopsy was benign but papilloma required surgical consult for excision.  Seen by Byrnett.  Ultrasound repeated and size was too small to be of concern.  Plans for diagnostic bilateral mammogram in May 2024 followed by surgical consult if needed

## 2022-06-04 NOTE — Addendum Note (Signed)
Addended by: Crecencio Mc on: 06/04/2022 09:38 AM   Modules accepted: Orders

## 2022-06-06 ENCOUNTER — Encounter: Payer: Self-pay | Admitting: Internal Medicine

## 2022-06-07 ENCOUNTER — Encounter: Payer: Self-pay | Admitting: Internal Medicine

## 2022-06-28 NOTE — Telephone Encounter (Signed)
Error

## 2022-06-30 ENCOUNTER — Other Ambulatory Visit: Payer: Medicare Other | Admitting: Pharmacist

## 2022-06-30 MED ORDER — METFORMIN HCL ER 500 MG PO TB24: 500.0000 mg | ORAL_TABLET | Freq: Every day | ORAL | 1 refills | Status: DC

## 2022-06-30 NOTE — Patient Instructions (Signed)
Hi Mahi,   Let's see if you tolerate the XR metformin formulation better.   I recommend setting some nutritional and physical activity weekly goals for yourself. Think about ways to incorporate proteins into each meal, and focus on the lean proteins, fruits and vegetables, and whole grains, with lower portions of carbohydrates. Please let us know if you change your mind and are interested in meeting with a nutritionist to set some specific dietary plans.   I strongly recommend you set some physical activity goals- whether it's walking or swimming a day week and building from there. Think about what things motivate you and how to set rewards for yourself when you achieve these physical activity goals. Send Dr. Derrel Nip or myself a MyChart message when you set these goals to help keep yourself accountable!   Let me know if you have any questions or concerns!  Catie Hedwig Morton, PharmD, [place] [phone]

## 2022-06-30 NOTE — Progress Notes (Addendum)
06/30/2022 Name: Lisa Valencia MRN: [id number] DOB: 20-Oct-1960  Chief Complaint  Patient presents with   Medication Management    Lisa Valencia is a 61 y.o. year old female who presented for a telephone visit.   They were referred to the pharmacist by their PCP for assistance in managing medication access.   Subjective:  Care Team: Primary Care Provider: Crecencio Mc, MD ; Next Scheduled Visit: 07/06/22  Medication Access/Adherence  Current Pharmacy:  [facility] ([place] [place] [address] Phone: [phone] Fax: [phone]  [facility] [place], [place] [address] [place] [address] Phone: [phone] Fax: [phone]   Patient reports affordability concerns with their medications: No  Patient reports access/transportation concerns to their pharmacy: No  Patient reports adherence concerns with their medications:  No     Pre-Diabetes and Obesity  Current medications: none Medications tried in the past: previously on Ozempic with significant benefit; prior nausea and hypoglycemic symptoms with metformin IR  Current meal patterns:  - Breakfast: sometimes nothing, yesterday had potato bread; sometimes scrambled egg, piece of ham; sometimes oatmeal - Lunch: cheese dogs with bible study yesterday; some days [place] sandwich on wheat bread, cheese; hamburger steaks; grilled onions, green beans, mashed potatoes  - Drinks: unsweet tea, lemon water; diet green tea with citrus;   Has done Weight Watchers; has seen a nutritionist before.   Current physical activity: limited by chronic pain, has not done any walking or pool activity;   Current medication access support: insurance does not cover Ozempic (no diagnosis of diabetes) or Wegovy (excluded from Medicare coverage)  Hypertension:  Current medications: spironolactone 50 mg daily  - patient reports she is taking every other day  Medications previously tried:   Patient has a validated, automated, upper arm home BP cuff Current blood pressure readings readings: has not been checking lately  Hyperlipidemia/ASCVD Risk Reduction  Current lipid lowering medications: simvastatin 20 mg daily   Health Maintenance  Health Maintenance Due  Topic Date Due   Lung Cancer Screening  Never done   COVID-19 Vaccine (4 - 2023-24 season) 03/18/2022   Diabetic kidney evaluation - Urine ACR  07/05/2022   Medicare Annual Wellness (AWV)  08/25/2022     Objective: Lab Results  Component Value Date   HGBA1C 6.2 06/02/2022    Lab Results  Component Value Date   CREATININE 0.66 06/02/2022   BUN 13 06/02/2022   NA 140 06/02/2022   K 4.2 06/02/2022   CL 102 06/02/2022   CO2 28 06/02/2022    Lab Results  Component Value Date   CHOL 194 06/02/2022   HDL 42.50 06/02/2022   LDLCALC 119 (H) 12/28/2021   LDLDIRECT 128.0 06/02/2022   TRIG 207.0 (H) 06/02/2022   CHOLHDL 5 06/02/2022    Medications Reviewed Today     Reviewed by Osker Mason, RPH-CPP (Pharmacist) on 06/30/22 at [place] List Status: <None>   Medication Order Taking? Sig Documenting Provider Last Dose Status Informant  amoxicillin (AMOXIL) 500 MG capsule [id number]  TAKE 4 CAPSULES ONE HOUR PRIOR TO Donnamae Jude Crecencio Mc, MD  Active   aspirin EC 81 MG tablet [id number] Yes Take 81 mg by mouth daily. [provider] Taking Active   celecoxib (CELEBREX) 200 MG capsule [id number] Yes TAKE 1 CAPSULE BY MOUTH 2 TIMES DAILY Kennyth Arnold, FNP  Taking Active   cetirizine (ZYRTEC) 10 MG tablet [id number] Yes Take 10 mg by mouth daily. [provider] Taking Active   cholecalciferol (VITAMIN D3) 25 MCG (1000 UNIT) tablet [id number] Yes Take 1,000 Units by mouth daily. [provider] Taking Active   conjugated estrogens (PREMARIN) vaginal cream [id number] Yes Place one  applicatorful vaginally two to three times weekly Crecencio Mc, MD Taking Active            Med Note De Hollingshead   Wed Nov 25, 2020  8:19 AM) 1-2 times weekly  methocarbamol (ROBAXIN) 500 MG tablet [id number] No Take 1 tablet (500 mg total) by mouth 4 (four) times daily.  Patient not taking: Reported on 06/30/2022   Crecencio Mc, MD Not Taking Active   Omega-3 Fatty Acids (FISH OIL) 1000 MG CAPS [id number] Yes Take 1,000 mg by mouth daily.  [provider] Taking Active Self  omeprazole (PRILOSEC) 20 MG capsule [id number] Yes TAKE ONE CAPSULE BY MOUTH TWICE DAILY BEFORE A MEAL Crecencio Mc, MD Taking Active   simvastatin (ZOCOR) 20 MG tablet [id number] Yes TAKE 1 TABLET BY MOUTH AT BEDTIME Crecencio Mc, MD Taking Active   spironolactone (ALDACTONE) 50 MG tablet [id number] Yes TAKE 1 TABLET BY MOUTH DAILY. GENERIC EQUIVALENT FOR ALDACTONE Kennyth Arnold, FNP Taking Active            Med Note Jodi Mourning, Tillie Fantasia Jun 30, 2022  9:05 AM) Every other day  vitamin E 200 UNIT capsule [id number] Yes Take 200 Units by mouth daily. [provider] Taking Active               Assessment/Plan:   Pre-Diabetes and Obesity - Currently uncontrolled. No options to access GLP1 given lack of indication - Reviewed dietary modifications including: focus on lean proteins, whole grains, decreased carbohydrate intake. Offered referral to [facility]ness or Nutritionist, patient declines. Motivational interviewing provided regarding setting goals for herself - Reviewed lifestyle modifications including: discussed increasing physical activity. Patient notes she has a pool membership and could walk at her house. Motivational interviewing given regarding setting goals.  - Discussed metformin. Prior GI upset with IR metformin, discussed that XR could be better tolerated. Given hypoglycemic symptoms, starting at 500 mg daily. Patient amenable to trying. Script sent  to pharmacy.   Hypertension: - Currently controlled - Recommend to continue current regimen at this time   Hyperlipidemia/ASCVD Risk Reduction: - Currently uncontrolled, elevated LDL and triglycerides.  - Reviewed long term complications of uncontrolled cholesterol - Reviewed dietary recommendations including reducing fat intake - Reviewed lifestyle recommendations including increasing physical activity - Recommend to continue current regimen, though could consider change to more potent statin moving forward to target LDL and some TG lowering  Follow Up Plan: phone call in 6 weeks  Catie Hedwig Morton, PharmD, [place], [place] [phone]   I have reviewed the above information and agree with above.   Deborra Medina, MD

## 2022-07-06 ENCOUNTER — Ambulatory Visit: Payer: Medicare Other | Admitting: Internal Medicine

## 2022-08-11 ENCOUNTER — Other Ambulatory Visit: Payer: Medicare Other | Admitting: Pharmacist

## 2022-08-11 MED ORDER — METFORMIN HCL ER 500 MG PO TB24: 500.0000 mg | ORAL_TABLET | Freq: Two times a day (BID) | ORAL | 1 refills | Status: DC

## 2022-08-11 NOTE — Progress Notes (Signed)
08/11/2022 Name: Lisa Valencia MRN: [id number] DOB: Nov 05, 1960  Chief Complaint  Patient presents with   Medication Management    KATERRA INGMAN is a 62 y.o. year old female who presented for a telephone visit.   They were referred to the pharmacist by their PCP for assistance in managing  prediabetes .   Subjective:  Care Team: Primary Care Provider: Crecencio Mc, MD ; Next Scheduled Visit: 12/2022  Medication Access/Adherence  Current Pharmacy:  [facility] ([place]) [place], [place] [address] Phone: [phone] Fax: [phone]  [facility] [place], [place] - [place] [address] [place] [address] [place] [address] Phone: [phone] Fax: [phone]   Patient reports affordability concerns with their medications: No  Patient reports access/transportation concerns to their pharmacy: No  Patient reports adherence concerns with their medications:  No    Pre-diabetes:  Current medications: metformin XR 500 mg daily Medications tried in the past: Ozempic with benefit, but unable to access on insurance or patient assistance at this point  Reports she has been tolerating metformin XR well, better than prior experience with metformin IR  Current meal patterns:  - Breakfast: steel cut oatmeal once weekly; occasionally eggs and country ham - Lunch: [place] sandwich on wheat bread; mustard;  - Supper: cheddar potato soup; once a week eats with her mother; baked bbq chicken, salad and rotisserie chicken - Drinks: water, unsweet tea; [employer] brand of zero sugar flavor; choosing a lot of no  Current physical activity: limited by weather, but has been setting goals of being more active around the house on days she can't get outside. As weather improves, she plans to set a goal of walking to her mailbox at least 3 days weekly.   Does report 3 lb weight loss since we last spoke;    Hyperlipidemia/ASCVD Risk Reduction  Current lipid lowering medications: simvastatin 20 mg daily    Vitals:   08/11/22 0905  Weight: 288 lb (130.6 kg)     Objective:  Lab Results  Component Value Date   HGBA1C 6.2 06/02/2022    Lab Results  Component Value Date   CREATININE 0.66 06/02/2022   BUN 13 06/02/2022   NA 140 06/02/2022   K 4.2 06/02/2022   CL 102 06/02/2022   CO2 28 06/02/2022    Lab Results  Component Value Date   CHOL 194 06/02/2022   HDL 42.50 06/02/2022   LDLCALC 119 (H) 12/28/2021   LDLDIRECT 128.0 06/02/2022   TRIG 207.0 (H) 06/02/2022   CHOLHDL 5 06/02/2022    Medications Reviewed Today     Reviewed by Osker Mason, RPH-CPP (Pharmacist) on 06/30/22 at [place] List Status: <None>   Medication Order Taking? Sig Documenting Provider Last Dose Status Informant  amoxicillin (AMOXIL) 500 MG capsule [id number]  TAKE 4 CAPSULES ONE HOUR PRIOR TO Donnamae Jude Crecencio Mc, MD  Active   aspirin EC 81 MG tablet [id number] Yes Take 81 mg by mouth daily. [provider] Taking Active   celecoxib (CELEBREX) 200 MG capsule [id number] Yes TAKE 1 CAPSULE BY MOUTH 2 TIMES DAILY Dutch Quint B, FNP Taking Active   cetirizine (ZYRTEC) 10 MG tablet [id number] Yes Take 10 mg by mouth daily. [provider] Taking Active   cholecalciferol (VITAMIN D3) 25 MCG (1000 UNIT) tablet [id number] Yes Take 1,000 Units by mouth daily. [provider] Taking Active  conjugated estrogens (PREMARIN) vaginal cream [id number] Yes Place one applicatorful vaginally two to three times weekly Crecencio Mc, MD Taking Active            Med Note De Hollingshead   Wed Nov 25, 2020  8:19 AM) 1-2 times weekly  methocarbamol (ROBAXIN) 500 MG tablet [id number] No Take 1 tablet (500 mg total) by mouth 4 (four) times daily.  Patient not taking: Reported on 06/30/2022   Crecencio Mc, MD Not Taking Active   Omega-3 Fatty Acids (FISH OIL) 1000 MG  CAPS [id number] Yes Take 1,000 mg by mouth daily.  [provider] Taking Active Self  omeprazole (PRILOSEC) 20 MG capsule [id number] Yes TAKE ONE CAPSULE BY MOUTH TWICE DAILY BEFORE A MEAL Crecencio Mc, MD Taking Active   simvastatin (ZOCOR) 20 MG tablet [id number] Yes TAKE 1 TABLET BY MOUTH AT BEDTIME Crecencio Mc, MD Taking Active   spironolactone (ALDACTONE) 50 MG tablet [id number] Yes TAKE 1 TABLET BY MOUTH DAILY. GENERIC EQUIVALENT FOR ALDACTONE Kennyth Arnold, FNP Taking Active            Med Note Jodi Mourning, Tillie Fantasia Jun 30, 2022  9:05 AM) Every other day  vitamin E 200 UNIT capsule [id number] Yes Take 200 Units by mouth daily. [provider] Taking Active             Assessment/Plan:   Pre-diabetes: - Currently improved - Reviewed dietary modifications including praised for sustainable changes of reducing sugars, focus on lean proteins, vegetables and fruits, whole grains.  - Reviewed lifestyle modifications including: encouraging increasing in physical activity. Praised for setting attainable and realistic goals.  - Recommend to increasing metformin to 1000 mg daily. Script updated.   Hyperlipidemia/ASCVD Risk Reduction: - Currently uncontrolled.  - Reviewed long term complications of uncontrolled cholesterol - Discussed impact of dietary changes as above. Recommend to continue current regimen, though if next LDL not at goal <100, recommend to adjust to higher potency statin     Follow Up Plan: phone call in 8 weeks  Catie Hedwig Morton, PharmD, [place], [facility] [phone]

## 2022-08-11 NOTE — Patient Instructions (Addendum)
[place],   It was great talking to you today!  Keep up the great work with cutting down on extra sugars, while focusing on lean proteins, fruits and vegetables, and whole grains. Here is a website with some other great nutritional information.   https://diabetes.org/food-nutrition  I love your goal of walking to the mailbox at least 3 days weekly.   Increase metformin to two tablets daily.   Take care,   Catie Hedwig Morton, PharmD, [place], [facility] [phone]

## 2022-09-02 ENCOUNTER — Ambulatory Visit (INDEPENDENT_AMBULATORY_CARE_PROVIDER_SITE_OTHER): Payer: Medicare Other

## 2022-09-02 VITALS — Ht 70.0 in | Wt 291.0 lb

## 2022-09-02 DIAGNOSIS — Z0000 Encounter for general adult medical examination without abnormal findings: Secondary | ICD-10-CM | POA: Diagnosis not present

## 2022-09-02 DIAGNOSIS — Z1231 Encounter for screening mammogram for malignant neoplasm of breast: Secondary | ICD-10-CM | POA: Diagnosis not present

## 2022-09-02 NOTE — Progress Notes (Addendum)
Subjective:   Lisa Valencia is a 62 y.o. female who presents for Medicare Annual (Subsequent) preventive examination.  Review of Systems    No ROS.  Medicare Wellness Virtual Visit.  Visual/audio telehealth visit, UTA vital signs.   See social history for additional risk factors.   Cardiac Risk Factors include: advanced age (>25mn, >>84women)     Objective:    Today's Vitals   09/02/22 1535  Weight: 291 lb (132 kg)  Height: 5' 10"$  (1.778 m)   Body mass index is 41.75 kg/m.     09/02/2022    3:52 PM 08/25/2021    9:15 AM 05/02/2018    4:21 PM 05/02/2018    9:39 AM 04/19/2018   11:56 AM 06/05/2015    7:48 AM  Advanced Directives  Does Patient Have a Medical Advance Directive? No Yes Yes Yes No No  Type of ACorporate treasurerof [place]ission [place] will Living will [place]   Does patient want to make changes to medical advance directive?  No - Patient declined No - Patient declined     Copy of [place]?  Yes - validated most recent copy scanned in chart (See row information) No - copy requested Yes    Would patient like information on creating a medical advance directive? No - Patient declined  No - Patient declined  Yes (MAU/Ambulatory/Procedural Areas - Information given)     Current Medications (verified) Outpatient Encounter Medications as of 09/02/2022  Medication Sig   amoxicillin (AMOXIL) 500 MG capsule TAKE 4 CAPSULES ONE HOUR PRIOR TO DENTALPROCEDURE   aspirin EC 81 MG tablet Take 81 mg by mouth daily.   celecoxib (CELEBREX) 200 MG capsule TAKE 1 CAPSULE BY MOUTH 2 TIMES DAILY   cetirizine (ZYRTEC) 10 MG tablet Take 10 mg by mouth daily.   cholecalciferol (VITAMIN D3) 25 MCG (1000 UNIT) tablet Take 1,000 Units by mouth daily.   conjugated estrogens (PREMARIN) vaginal cream Place one applicatorful vaginally two to three times weekly   metFORMIN (GLUCOPHAGE-XR) 500 MG 24 hr tablet Take 1 tablet (500 mg  total) by mouth 2 (two) times daily with a meal.   methocarbamol (ROBAXIN) 500 MG tablet Take 1 tablet (500 mg total) by mouth 4 (four) times daily. (Patient not taking: Reported on 06/30/2022)   Omega-3 Fatty Acids (FISH OIL) 1000 MG CAPS Take 1,000 mg by mouth daily.    omeprazole (PRILOSEC) 20 MG capsule TAKE ONE CAPSULE BY MOUTH TWICE DAILY BEFORE A MEAL   simvastatin (ZOCOR) 20 MG tablet TAKE 1 TABLET BY MOUTH AT BEDTIME   spironolactone (ALDACTONE) 50 MG tablet TAKE 1 TABLET BY MOUTH DAILY. GENERIC EQUIVALENT FOR ALDACTONE   vitamin E 200 UNIT capsule Take 200 Units by mouth daily.   No facility-administered encounter medications on file as of 09/02/2022.    Allergies (verified) Sulfa antibiotics, Chantix [varenicline], Codeine sulfate, Fenofibrate, Pseudoephedrine hcl, and Wellbutrin [bupropion]   History: Past Medical History:  Diagnosis Date   Anxiety    Arthritis    Depression    Diverticulosis of intestine 2010   iftikhar   Esophagitis, reflux 2010    EGD by iftkihar, managed wiht dexilant   GERD (gastroesophageal reflux disease)    Impaired fasting glucose 01/25/2012   Needs eye exam.     Past Surgical History:  Procedure Laterality Date   ABDOMINAL HYSTERECTOMY  2009   supracervical    APPENDECTOMY     BREAST  CYST ASPIRATION Right    rt [id number]   COLONOSCOPY WITH PROPOFOL N/A 06/05/2015   Procedure: COLONOSCOPY WITH PROPOFOL;  Surgeon: Lucilla Lame, MD;  Location: [facility];  Service: Endoscopy;  Laterality: N/A;  Pt requests early. Pt for antibiotic pre-procedure  CECAL PROTRUSION BX. / WITH CLIP LOT # [id number] RESOLUTION 360 EXP 03/03/2016 SIGMOID POLYP   JOINT REPLACEMENT Right 2012   hip, [name]   TOTAL HIP ARTHROPLASTY Left 05/02/2018   Procedure: TOTAL HIP ARTHROPLASTY;  Surgeon: Dereck Leep, MD;  Location: [facility];  Service: Orthopedics;  Laterality: Left;   Family History  Problem Relation Age of Onset   Cancer Father        lung ca    Hyperlipidemia Father        PAD   Diabetes Sister    Cancer Paternal Aunt        breast   Breast cancer Paternal Aunt 70   Diabetes Maternal Grandfather    Cancer Maternal Grandfather        colon ca   Cancer Paternal Grandmother        colon   Cancer Paternal Grandfather        colon ca   Heart disease Neg Hx    Social History   Socioeconomic History   Marital status: Married    Spouse name: david   Number of children: Not on file   Years of education: Not on file   Highest education level: Not on file  Occupational History   Occupation:  Therapist, 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: [employer]    Comment: has applied for disability  Tobacco Use   Smoking status: Former    Packs/day: 1.00    Years: 35.00    Total pack years: 35.00    Types: Cigarettes   Smokeless tobacco: Never  Vaping Use   Vaping Use: Never used  Substance and Sexual Activity   Alcohol use: Not Currently   Drug use: No   Sexual activity: Not on file  Other Topics Concern   Not on file  Social History Narrative   Not on file   Social Determinants of Health   Financial Resource Strain: Low Risk  (09/02/2022)   Overall Financial Resource Strain (CARDIA)    Difficulty of Paying Living Expenses: Not hard at all  Food Insecurity: No Food Insecurity (09/02/2022)   Hunger Vital Sign    Worried About Running Out of Food in the Last Year: Never true    Ran Out of Food in the Last Year: Never true  Transportation Needs: No Transportation Needs (09/02/2022)   PRAPARE - Hydrologist (Medical): No    Lack of Transportation (Non-Medical): No  Physical Activity: Insufficiently Active (09/02/2022)   Exercise Vital Sign    Days of Exercise per Week: 3 days    Minutes of Exercise per Session: 10 min  Stress: No Stress Concern Present (09/02/2022)   Tobias    Feeling of Stress : Not at all  Social Connections: Unknown  (09/02/2022)   Social Connection and Isolation Panel [NHANES]    Frequency of Communication with Friends and Family: Not on file    Frequency of Social Gatherings with Friends and Family: Not on file    Attends Religious Services: Not on file    Active Member of Clubs or Organizations: Not on file    Attends Archivist Meetings: Not  on file    Marital Status: Married    Tobacco Counseling Counseling given: Not Answered   Clinical Intake:  Pre-visit preparation completed: Yes        Diabetes: No  How often do you need to have someone help you when you read instructions, pamphlets, or other written materials from your doctor or pharmacy?: 1 - Never    Interpreter Needed?: No      Activities of Daily Living    09/02/2022    3:36 PM  In your present state of health, do you have any difficulty performing the following activities:  Hearing? 0  Vision? 0  Difficulty concentrating or making decisions? 0  Walking or climbing stairs? 0  Dressing or bathing? 0  Doing errands, shopping? 0  Preparing Food and eating ? N  Using the Toilet? N  In the past six months, have you accidently leaked urine? Y  Comment stress incontinence  Do you have problems with loss of bowel control? N  Managing your Medications? N  Managing your Finances? N  Housekeeping or managing your Housekeeping? N    Patient Care Team: Crecencio Mc, MD as PCP - General (Internal Medicine) Osker Mason, RPH-CPP (Pharmacist)  Indicate any recent Medical Services you may have received from other than [facility] providers in the past year (date may be approximate).     Assessment:   This is a routine wellness examination for Hideko.  I connected with  [name] on 09/02/22 by a audio enabled telemedicine application and verified that I am speaking with the correct person using two identifiers.  Patient Location: Home  Provider Location: Office/Clinic  I discussed the limitations  of evaluation and management by telemedicine. The patient expressed understanding and agreed to proceed.   Hearing/Vision screen Hearing Screening - Comments:: Patient is able to hear conversational tones without difficulty.  No issues reported.   Vision Screening - Comments:: Followed by [facility], Dr. George Ina Wears corrective lenses They have seen their ophthalmologist in the last 12 months.    Dietary issues and exercise activities discussed: Current Exercise Habits: Home exercise routine, Intensity: Mild GOLO diet Good water intake   Goals Addressed               This Visit's Progress     Patient Stated     I would like to lose about 90lb (pt-stated)        Walk for exercise Weight goal 200lb        Depression Screen    09/02/2022    3:42 PM 06/02/2022    9:09 AM 01/03/2022    8:57 AM 08/25/2021    9:14 AM 03/04/2021    8:58 AM 11/25/2020    9:49 AM 08/24/2020   11:22 AM  PHQ 2/9 Scores  PHQ - 2 Score 0 0 0 0 0 0 0  PHQ- 9 Score      5     Fall Risk    09/02/2022    3:44 PM 06/02/2022    9:09 AM 01/03/2022    8:57 AM 08/25/2021    9:17 AM 07/05/2021    9:27 AM  Fall Risk   Falls in the past year? 0 0 0 0 0  Number falls in past yr: 0   0   Injury with Fall? 0      Risk for fall due to :  No Fall Risks No Fall Risks  No Fall Risks  Follow up Falls  evaluation completed;Falls prevention discussed Falls evaluation completed Falls evaluation completed Falls evaluation completed Falls evaluation completed    FALL RISK PREVENTION PERTAINING TO THE HOME: Home free of loose throw rugs in walkways, pet beds, electrical cords, etc? Yes  Adequate lighting in your home to reduce risk of falls? Yes   ASSISTIVE DEVICES UTILIZED TO PREVENT FALLS: Life alert? No  Use of a cane, walker or w/c? No  Grab bars in the bathroom? No  Shower chair or bench in shower? No  Comfort chair height toilet? Yes   TIMED UP AND GO: Was the test performed? No .    Cognitive Function:        09/02/2022    3:52 PM  6CIT Screen  What Year? 0 points  What month? 0 points  What time? 0 points  Count back from 20 0 points  Months in reverse 0 points  Repeat phrase 0 points  Total Score 0 points    Immunizations Immunization History  Administered Date(s) Administered   Influenza Split 03/31/2014, 04/23/2015   Influenza,inj,Quad PF,6+ Mos 05/04/2018, 06/02/2022   Influenza-Unspecified 06/19/2017, 05/11/2019, 04/11/2020, 04/28/2021   Janssen (J&J) SARS-COV-2 Vaccination 10/24/2019, 05/15/2020   Moderna Covid-19 Vaccine Bivalent Booster 10yr & up 04/28/2021   Moderna SARS-COV2 Booster Vaccination 12/25/2020   Td 11/15/2019   Tdap 07/01/2010   Zoster Recombinat (Shingrix) 04/28/2021, 08/04/2021    Covid-19 vaccine status: Completed vaccines x3.  Labs followed by pcp.   Screening Tests Health Maintenance  Topic Date Due   HIV Screening  Never done   Diabetic kidney evaluation - Urine ACR  07/05/2022   COVID-19 Vaccine (4 - 2023-24 season) 09/18/2022 (Originally 03/18/2022)   Lung Cancer Screening  10/17/2022 (Originally 01/04/2011)   HEMOGLOBIN A1C  12/01/2022   MAMMOGRAM  12/09/2022   Diabetic kidney evaluation - eGFR measurement  06/03/2023   PAP SMEAR-Modifier  08/25/2023   Medicare Annual Wellness (AWV)  09/03/2023   COLONOSCOPY (Pts 45-435yrInsurance coverage will need to be confirmed)  06/13/2025   DTaP/Tdap/Td (3 - Td or Tdap) 11/14/2029   INFLUENZA VACCINE  Completed   Hepatitis C Screening  Completed   Zoster Vaccines- Shingrix  Completed   HPV VACCINES  Aged Out    Health Maintenance Health Maintenance Due  Topic Date Due   HIV Screening  Never done   Diabetic kidney evaluation - Urine ACR  07/05/2022   Mammogram- screening ordered. Number provided for scheduling. [id number]  Lung Cancer Screening: declined.   Vision Screening: Recommended annual ophthalmology exams for early detection of glaucoma and other  disorders of the eye.  Dental Screening: Recommended annual dental exams for proper oral hygiene  Community Resource Referral / Chronic Care Management: CRR required this visit?  No   CCM required this visit?  No      Plan:     I have personally reviewed and noted the following in the patient's chart:   Medical and social history Use of alcohol, tobacco or illicit drugs  Current medications and supplements including opioid prescriptions. Patient is not currently taking opioid prescriptions. Functional ability and status Nutritional status Physical activity Advanced directives List of other physicians Hospitalizations, surgeries, and ER visits in previous 12 months Vitals Screenings to include cognitive, depression, and falls Referrals and appointments  In addition, I have reviewed and discussed with patient certain preventive protocols, quality metrics, and best practice recommendations. A written personalized care plan for preventive services as well as general preventive health recommendations were provided to  patient.     [place], LPN   [id number]    I have reviewed the above information and agree with above.   Deborra Medina, MD

## 2022-09-02 NOTE — Patient Instructions (Signed)
[name] , Thank you for taking time to come for your Medicare Wellness Visit. I appreciate your ongoing commitment to your health goals. Please review the following plan we discussed and let me know if I can assist you in the future.   These are the goals we discussed:  Goals       Patient Stated     I would like to lose about 90lb (pt-stated)      Walk for exercise Weight goal 200lb         This is a list of the screening recommended for you and due dates:  Health Maintenance  Topic Date Due   HIV Screening  Never done   Yearly kidney health urinalysis for diabetes  07/05/2022   COVID-19 Vaccine (4 - 2023-24 season) 09/18/2022*   Screening for Lung Cancer  10/17/2022*   Hemoglobin A1C  12/01/2022   Mammogram  12/09/2022   Yearly kidney function blood test for diabetes  06/03/2023   Pap Smear  08/25/2023   Medicare Annual Wellness Visit  09/03/2023   Colon Cancer Screening  06/13/2025   DTaP/Tdap/Td vaccine (3 - Td or Tdap) 11/14/2029   Flu Shot  Completed   Hepatitis C Screening: USPSTF Recommendation to screen - Ages 18-79 yo.  Completed   Zoster (Shingles) Vaccine  Completed   HPV Vaccine  Aged Out  *Topic was postponed. The date shown is not the original due date.    Conditions/risks identified: none new  Next appointment: Follow up in one year for your annual wellness visit    Preventive Care 65 Years and Older, Female Preventive care refers to lifestyle choices and visits with your health care provider that can promote health and wellness. What does preventive care include? A yearly physical exam. This is also called an annual well check. Dental exams once or twice a year. Routine eye exams. Ask your health care provider how often you should have your eyes checked. Personal lifestyle choices, including: Daily care of your teeth and gums. Regular physical activity. Eating a healthy diet. Avoiding tobacco and drug use. Limiting alcohol use. Practicing safe  sex. Taking low-dose aspirin every day. Taking vitamin and mineral supplements as recommended by your health care provider. What happens during an annual well check? The services and screenings done by your health care provider during your annual well check will depend on your age, overall health, lifestyle risk factors, and family history of disease. Counseling  Your health care provider may ask you questions about your: Alcohol use. Tobacco use. Drug use. Emotional well-being. Home and relationship well-being. Sexual activity. Eating habits. History of falls. Memory and ability to understand (cognition). Work and work Statistician. Reproductive health. Screening  You may have the following tests or measurements: Height, weight, and BMI. Blood pressure. Lipid and cholesterol levels. These may be checked every 5 years, or more frequently if you are over 59 years old. Skin check. Lung cancer screening. You may have this screening every year starting at age 17 if you have a 30-pack-year history of smoking and currently smoke or have quit within the past 15 years. Fecal occult blood test (FOBT) of the stool. You may have this test every year starting at age 35. Flexible sigmoidoscopy or colonoscopy. You may have a sigmoidoscopy every 5 years or a colonoscopy every 10 years starting at age 34. Hepatitis C blood test. Hepatitis B blood test. Sexually transmitted disease (STD) testing. Diabetes screening. This is done by checking your blood sugar (glucose)  after you have not eaten for a while (fasting). You may have this done every 1-3 years. Bone density scan. This is done to screen for osteoporosis. You may have this done starting at age 52. Mammogram. This may be done every 1-2 years. Talk to your health care provider about how often you should have regular mammograms. Talk with your health care provider about your test results, treatment options, and if necessary, the need for more  tests. Vaccines  Your health care provider may recommend certain vaccines, such as: Influenza vaccine. This is recommended every year. Tetanus, diphtheria, and acellular pertussis (Tdap, Td) vaccine. You may need a Td booster every 10 years. Zoster vaccine. You may need this after age 28. Pneumococcal 13-valent conjugate (PCV13) vaccine. One dose is recommended after age 11. Pneumococcal polysaccharide (PPSV23) vaccine. One dose is recommended after age 44. Talk to your health care provider about which screenings and vaccines you need and how often you need them. This information is not intended to replace advice given to you by your health care provider. Make sure you discuss any questions you have with your health care provider. Document Released: 07/31/2015 Document Revised: 03/23/2016 Document Reviewed: 05/05/2015 Elsevier Interactive Patient Education  2017 [place] Prevention in the Home Falls can cause injuries. They can happen to people of all ages. There are many things you can do to make your home safe and to help prevent falls. What can I do on the outside of my home? Regularly fix the edges of walkways and driveways and fix any cracks. Remove anything that might make you trip as you walk through a door, such as a raised step or threshold. Trim any bushes or trees on the path to your home. Use bright outdoor lighting. Clear any walking paths of anything that might make someone trip, such as rocks or tools. Regularly check to see if handrails are loose or broken. Make sure that both sides of any steps have handrails. Any raised decks and porches should have guardrails on the edges. Have any leaves, snow, or ice cleared regularly. Use sand or salt on walking paths during winter. Clean up any spills in your garage right away. This includes oil or grease spills. What can I do in the bathroom? Use night lights. Install grab bars by the toilet and in the tub and shower.  Do not use towel bars as grab bars. Use non-skid mats or decals in the tub or shower. If you need to sit down in the shower, use a plastic, non-slip stool. Keep the floor dry. Clean up any water that spills on the floor as soon as it happens. Remove soap buildup in the tub or shower regularly. Attach bath mats securely with double-sided non-slip rug tape. Do not have throw rugs and other things on the floor that can make you trip. What can I do in the bedroom? Use night lights. Make sure that you have a light by your bed that is easy to reach. Do not use any sheets or blankets that are too big for your bed. They should not hang down onto the floor. Have a firm chair that has side arms. You can use this for support while you get dressed. Do not have throw rugs and other things on the floor that can make you trip. What can I do in the kitchen? Clean up any spills right away. Avoid walking on wet floors. Keep items that you use a lot in easy-to-reach places. If  you need to reach something above you, use a strong step stool that has a grab bar. Keep electrical cords out of the way. Do not use floor polish or wax that makes floors slippery. If you must use wax, use non-skid floor wax. Do not have throw rugs and other things on the floor that can make you trip. What can I do with my stairs? Do not leave any items on the stairs. Make sure that there are handrails on both sides of the stairs and use them. Fix handrails that are broken or loose. Make sure that handrails are as long as the stairways. Check any carpeting to make sure that it is firmly attached to the stairs. Fix any carpet that is loose or worn. Avoid having throw rugs at the top or bottom of the stairs. If you do have throw rugs, attach them to the floor with carpet tape. Make sure that you have a light switch at the top of the stairs and the bottom of the stairs. If you do not have them, ask someone to add them for you. What else  can I do to help prevent falls? Wear shoes that: Do not have high heels. Have rubber bottoms. Are comfortable and fit you well. Are closed at the toe. Do not wear sandals. If you use a stepladder: Make sure that it is fully opened. Do not climb a closed stepladder. Make sure that both sides of the stepladder are locked into place. Ask someone to hold it for you, if possible. Clearly mark and make sure that you can see: Any grab bars or handrails. First and last steps. Where the edge of each step is. Use tools that help you move around (mobility aids) if they are needed. These include: Canes. Walkers. Scooters. Crutches. Turn on the lights when you go into a dark area. Replace any light bulbs as soon as they burn out. Set up your furniture so you have a clear path. Avoid moving your furniture around. If any of your floors are uneven, fix them. If there are any pets around you, be aware of where they are. Review your medicines with your doctor. Some medicines can make you feel dizzy. This can increase your chance of falling. Ask your doctor what other things that you can do to help prevent falls. This information is not intended to replace advice given to you by your health care provider. Make sure you discuss any questions you have with your health care provider. Document Released: 04/30/2009 Document Revised: 12/10/2015 Document Reviewed: 08/08/2014 Elsevier Interactive Patient Education  2017 [employer].

## 2022-09-25 ENCOUNTER — Telehealth: Payer: Medicare Other

## 2022-09-25 ENCOUNTER — Telehealth: Payer: Medicare Other | Admitting: Family

## 2022-09-25 ENCOUNTER — Encounter: Payer: Self-pay | Admitting: Internal Medicine

## 2022-09-25 DIAGNOSIS — J441 Chronic obstructive pulmonary disease with (acute) exacerbation: Secondary | ICD-10-CM

## 2022-09-25 MED ORDER — DOXYCYCLINE HYCLATE 100 MG PO TABS: 100.0000 mg | ORAL_TABLET | Freq: Two times a day (BID) | ORAL | 0 refills | Status: DC

## 2022-09-25 MED ORDER — BENZONATATE 100 MG PO CAPS: 100.0000 mg | ORAL_CAPSULE | Freq: Three times a day (TID) | ORAL | 0 refills | Status: DC | PRN

## 2022-09-25 NOTE — Progress Notes (Signed)
Virtual Visit Consent   Lisa Valencia, you are scheduled for a virtual visit with a [facility] provider today. Just as with appointments in the office, your consent must be obtained to participate. Your consent will be active for this visit and any virtual visit you may have with one of our providers in the next 365 days. If you have a MyChart account, a copy of this consent can be sent to you electronically.  As this is a virtual visit, video technology does not allow for your provider to perform a traditional examination. This may limit your provider's ability to fully assess your condition. If your provider identifies any concerns that need to be evaluated in person or the need to arrange testing (such as labs, EKG, etc.), we will make arrangements to do so. Although advances in technology are sophisticated, we cannot ensure that it will always work on either your end or our end. If the connection with a video visit is poor, the visit may have to be switched to a telephone visit. With either a video or telephone visit, we are not always able to ensure that we have a secure connection.  By engaging in this virtual visit, you consent to the provision of healthcare and authorize for your insurance to be billed (if applicable) for the services provided during this visit. Depending on your insurance coverage, you may receive a charge related to this service.  I need to obtain your verbal consent now. Are you willing to proceed with your visit today? AMANDINE AUGER has provided verbal consent on 09/25/2022 for a virtual visit (video or telephone). Evelina Dun, FNP  Date: 09/25/2022 9:39 AM  Virtual Visit via Video Note   I, Evelina Dun, connected with  Lisa Valencia  ([id number], Jun 22, 1961) on 09/25/22 at  9:30 AM EDT by a video-enabled telemedicine application and verified that I am speaking with the correct person using two identifiers.  Location: Patient: Virtual Visit Location Patient:  Home Provider: Virtual Visit Location Provider: Home Office   I discussed the limitations of evaluation and management by telemedicine and the availability of in person appointments. The patient expressed understanding and agreed to proceed.    History of Present Illness: Lisa Valencia is a 62 y.o. who identifies as a female who was assigned female at birth, and is being seen today for cough for the last two weeks.  HPI: Cough This is a new problem. The current episode started 1 to 4 weeks ago. The problem has been gradually worsening. The problem occurs every few minutes. The cough is Productive of sputum. Associated symptoms include nasal congestion, shortness of breath and wheezing. Pertinent negatives include no chills, ear congestion, ear pain, fever, headaches or sore throat. Risk factors for lung disease include smoking/tobacco exposure. She has tried rest and OTC cough suppressant for the symptoms. The treatment provided mild relief. Her past medical history is significant for COPD.    Problems:  Patient Active Problem List   Diagnosis Date Noted   Breast mass, right 01/08/2022   Dysuria 01/03/2022   Vitamin D deficiency 07/08/2021   Allergic rhinitis 07/05/2021   Vertigo 01/01/2021   Elevated blood-pressure reading, without diagnosis of hypertension 08/25/2020   Encounter for Papanicolaou smear for cervical cancer screening 08/25/2020   Status post total replacement of hip 05/02/2018   Primary osteoarthritis of left hip 01/30/2018   DDD (degenerative disc disease), lumbar 02/12/2017   Snoring 02/03/2016   Special screening for malignant neoplasms, colon  Benign neoplasm of sigmoid colon    Hepatic steatosis 03/09/2014   Welcome to Medicare preventive visit 03/08/2014   S/P abdominal supracervical subtotal hysterectomy 03/05/2014   Sciatica 04/29/2013   Hyperlipidemia, unspecified 01/28/2013   Morbid obesity ([place]) 01/25/2012   Prediabetes 01/25/2012   Routine general  medical examination at a health care facility 01/25/2012   Tobacco abuse 01/25/2012   Reflux esophagitis    Diverticulosis of intestine     Allergies:  Allergies  Allergen Reactions   Sulfa Antibiotics Swelling and Other (See Comments)    Angioedema THROAT AND TONGUE HAD SWELLING   Chantix [Varenicline] Nausea Only   Codeine Sulfate Nausea Only   Fenofibrate Other (See Comments)    Muscle aches   Pseudoephedrine Hcl Palpitations   Wellbutrin [Bupropion] Hives   Medications:  Current Outpatient Medications:    benzonatate (TESSALON PERLES) 100 MG capsule, Take 1 capsule (100 mg total) by mouth 3 (three) times daily as needed., Disp: 20 capsule, Rfl: 0   doxycycline (VIBRA-TABS) 100 MG tablet, Take 1 tablet (100 mg total) by mouth 2 (two) times daily., Disp: 20 tablet, Rfl: 0   amoxicillin (AMOXIL) 500 MG capsule, TAKE 4 CAPSULES ONE HOUR PRIOR TO DENTALPROCEDURE, Disp: 12 capsule, Rfl: 3   aspirin EC 81 MG tablet, Take 81 mg by mouth daily., Disp: , Rfl:    celecoxib (CELEBREX) 200 MG capsule, TAKE 1 CAPSULE BY MOUTH 2 TIMES DAILY, Disp: 180 capsule, Rfl: 3   cetirizine (ZYRTEC) 10 MG tablet, Take 10 mg by mouth daily., Disp: , Rfl:    cholecalciferol (VITAMIN D3) 25 MCG (1000 UNIT) tablet, Take 1,000 Units by mouth daily., Disp: , Rfl:    conjugated estrogens (PREMARIN) vaginal cream, Place one applicatorful vaginally two to three times weekly, Disp: 90 g, Rfl: 3   metFORMIN (GLUCOPHAGE-XR) 500 MG 24 hr tablet, Take 1 tablet (500 mg total) by mouth 2 (two) times daily with a meal., Disp: 180 tablet, Rfl: 1   methocarbamol (ROBAXIN) 500 MG tablet, Take 1 tablet (500 mg total) by mouth 4 (four) times daily. (Patient not taking: Reported on 06/30/2022), Disp: 90 tablet, Rfl: 0   Omega-3 Fatty Acids (FISH OIL) 1000 MG CAPS, Take 1,000 mg by mouth daily. , Disp: , Rfl:    omeprazole (PRILOSEC) 20 MG capsule, TAKE ONE CAPSULE BY MOUTH TWICE DAILY BEFORE A MEAL, Disp: 180 capsule, Rfl: 3    simvastatin (ZOCOR) 20 MG tablet, TAKE 1 TABLET BY MOUTH AT BEDTIME, Disp: 90 tablet, Rfl: 3   spironolactone (ALDACTONE) 50 MG tablet, TAKE 1 TABLET BY MOUTH DAILY. GENERIC EQUIVALENT FOR ALDACTONE, Disp: 90 tablet, Rfl: 3   vitamin E 200 UNIT capsule, Take 200 Units by mouth daily., Disp: , Rfl:   Observations/Objective: Patient is well-developed, well-nourished in no acute distress.  Resting comfortably  at home.  Head is normocephalic, atraumatic.  No labored breathing.  Speech is clear and coherent with logical content.  Patient is alert and oriented at baseline.  Coarse nonproductive cough  Assessment and Plan: 1. COPD exacerbation (HCC) - doxycycline (VIBRA-TABS) 100 MG tablet; Take 1 tablet (100 mg total) by mouth 2 (two) times daily.  Dispense: 20 tablet; Refill: 0 - benzonatate (TESSALON PERLES) 100 MG capsule; Take 1 capsule (100 mg total) by mouth 3 (three) times daily as needed.  Dispense: 20 capsule; Refill: 0  - Take meds as prescribed - Use a cool mist humidifier  -Use saline nose sprays frequently -Force fluids -For any cough or congestion  Use plain Mucinex- regular strength or max strength is fine -For fever or aces or pains- take tylenol or ibuprofen. -Throat lozenges if help -Follow up if symptoms worsen or do not improve   Follow Up Instructions: I discussed the assessment and treatment plan with the patient. The patient was provided an opportunity to ask questions and all were answered. The patient agreed with the plan and demonstrated an understanding of the instructions.  A copy of instructions were sent to the patient via MyChart unless otherwise noted below.     The patient was advised to call back or seek an in-person evaluation if the symptoms worsen or if the condition fails to improve as anticipated.  Time:  I spent 9 minutes with the patient via telehealth technology discussing the above problems/concerns.    Evelina Dun, FNP

## 2022-09-27 ENCOUNTER — Other Ambulatory Visit: Payer: Self-pay

## 2022-09-27 ENCOUNTER — Telehealth: Payer: Medicare Other | Admitting: Family

## 2022-09-27 MED ORDER — CELECOXIB 200 MG PO CAPS: 200.0000 mg | ORAL_CAPSULE | Freq: Two times a day (BID) | ORAL | 1 refills | Status: DC

## 2022-09-30 DIAGNOSIS — D2261 Melanocytic nevi of right upper limb, including shoulder: Secondary | ICD-10-CM | POA: Diagnosis not present

## 2022-09-30 DIAGNOSIS — D2272 Melanocytic nevi of left lower limb, including hip: Secondary | ICD-10-CM | POA: Diagnosis not present

## 2022-09-30 DIAGNOSIS — D485 Neoplasm of uncertain behavior of skin: Secondary | ICD-10-CM | POA: Diagnosis not present

## 2022-09-30 DIAGNOSIS — D2262 Melanocytic nevi of left upper limb, including shoulder: Secondary | ICD-10-CM | POA: Diagnosis not present

## 2022-09-30 DIAGNOSIS — Q828 Other specified congenital malformations of skin: Secondary | ICD-10-CM | POA: Diagnosis not present

## 2022-09-30 DIAGNOSIS — L718 Other rosacea: Secondary | ICD-10-CM | POA: Diagnosis not present

## 2022-10-05 DIAGNOSIS — K080 Exfoliation of teeth due to systemic causes: Secondary | ICD-10-CM | POA: Diagnosis not present

## 2022-10-06 ENCOUNTER — Other Ambulatory Visit: Payer: Medicare Other | Admitting: Pharmacist

## 2022-10-06 MED ORDER — METFORMIN HCL ER 500 MG PO TB24: 500.0000 mg | ORAL_TABLET | Freq: Every day | ORAL | 3 refills | Status: DC

## 2022-10-06 NOTE — Patient Instructions (Signed)
[place],   It was great talking to you!  Cut metformin back down to one a day. Keep up the fabulous work with your nutritional patterns and staying active.   Please reach out with any future medication questions or concerns.   Thanks!  Catie Hedwig Morton, PharmD, [place], [facility] [phone]

## 2022-10-06 NOTE — Progress Notes (Signed)
10/06/2022 Name: Lisa Valencia MRN: [id number] DOB: Apr 15, 1961  Chief Complaint  Patient presents with   Medication Management   Hyperlipidemia    Lisa Valencia is a 62 y.o. year old female who presented for a telephone visit.   They were referred to the pharmacist by their PCP for assistance in managing complex medication management.    Subjective:  Care Team: Primary Care Provider: Crecencio Mc, MD ; Next Scheduled Visit: 12/2022  Medication Access/Adherence  Current Pharmacy:  [facility] [place] [place] [address] Phone: [phone] Fax: [phone]  [place], [place] - [address] [place] [address] Phone: [phone] Fax: [phone]  [id number] - [place], [place] [address] [place] [address] Phone: [phone] Fax: [phone]   Patient reports affordability concerns with their medications: No  Patient reports access/transportation concerns to their pharmacy: No  Patient reports adherence concerns with their medications:  No     Diabetes:  Current medications: metformin XR 500 mg twice daily - she report she had significant diarrhea with increased dose, so has cut back go 1 a day   GO LO - 3 meals 4-6 hours apart, proteins, whole grains (2 servings of protein, 1 serving of carbs, 2 of vegetables, 1 of fat), occasionally having a snack. Breakfast - had yogurt, strawberries, added whole grain toast to keep her full. Morning glory muffins.   Current physical activity: working out in the yard more, has been doing a lot of gardening.   Has lost ~ 3-5 lbs since she started GO LO diet.   Objective:  Lab Results  Component Value Date   HGBA1C 6.2 06/02/2022    Lab Results  Component Value Date   CREATININE 0.66 06/02/2022   BUN 13 06/02/2022   NA 140 06/02/2022   K 4.2 06/02/2022    CL 102 06/02/2022   CO2 28 06/02/2022    Lab Results  Component Value Date   CHOL 194 06/02/2022   HDL 42.50 06/02/2022   LDLCALC 119 (H) 12/28/2021   LDLDIRECT 128.0 06/02/2022   TRIG 207.0 (H) 06/02/2022   CHOLHDL 5 06/02/2022    Medications Reviewed Today     Reviewed by Osker Mason, RPH-CPP (Pharmacist) on 10/06/22 at [phone]  Med List Status: <None>   Medication Order Taking? Sig Documenting Provider Last Dose Status Informant  amoxicillin (AMOXIL) 500 MG capsule [id number]  TAKE 4 CAPSULES ONE HOUR PRIOR TO [place] Jude Crecencio Mc, MD  Active   aspirin EC 81 MG tablet [id number]  Take 81 mg by mouth daily. [provider]  Active   benzonatate (TESSALON PERLES) 100 MG capsule [id number]  Take 1 capsule (100 mg total) by mouth 3 (three) times daily as needed. Evelina Dun A, FNP  Active   celecoxib (CELEBREX) 200 MG capsule [id number]  Take 1 capsule (200 mg total) by mouth 2 (two) times daily. Crecencio Mc, MD  Active   cetirizine (ZYRTEC) 10 MG tablet [id number]  Take 10 mg by mouth daily. [provider]  Active   cholecalciferol (VITAMIN D3) 25 MCG (1000 UNIT) tablet [id number]  Take 1,000 Units by mouth daily. [provider]  Active   conjugated estrogens (PREMARIN) vaginal cream [id number]  Place one applicatorful vaginally two to three times weekly Crecencio Mc, MD  Active  Med Note ([place]   Wed Nov 25, 2020  8:19 AM) 1-2 times weekly  metFORMIN (GLUCOPHAGE-XR) 500 MG 24 hr tablet [id number] Yes Take 1 tablet (500 mg total) by mouth 2 (two) times daily with a meal. Crecencio Mc, MD Taking Active   methocarbamol (ROBAXIN) 500 MG tablet [id number]  Take 1 tablet (500 mg total) by mouth 4 (four) times daily.  Patient not taking: Reported on 06/30/2022   Crecencio Mc, MD  Active   Omega-3 Fatty Acids (FISH OIL) 1000 MG CAPS [id number]  Take 1,000 mg by mouth daily.  [provider]  Active Self   omeprazole (PRILOSEC) 20 MG capsule [id number]  TAKE ONE CAPSULE BY MOUTH TWICE DAILY BEFORE A MEAL Crecencio Mc, MD  Active   simvastatin (ZOCOR) 20 MG tablet [id number]  TAKE 1 TABLET BY MOUTH AT BEDTIME Crecencio Mc, MD  Active   spironolactone (ALDACTONE) 50 MG tablet [id number] Yes TAKE 1 TABLET BY MOUTH DAILY. GENERIC EQUIVALENT FOR ALDACTONE Kennyth Arnold, FNP Taking Active            Med Note Jodi Mourning, Tillie Fantasia Jun 30, 2022  9:05 AM) Every other day  vitamin E 200 UNIT capsule [id number]  Take 200 Units by mouth daily. [provider]  Active               Assessment/Plan:   Pre-diabetes: - Currently controlled. Agree to reduce metformin to once daily.  - Reviewed dietary modifications including focus on whole grains, proteins, vegetables like her GO LO nutritional plan - Reviewed lifestyle modifications including: increasing physical activity.  - Encouraged to continue current regimen at this time  Follow Up Plan: follow up with PCP as scheduled.   Catie Hedwig Morton, PharmD, [place], [facility] [phone]

## 2022-11-10 DIAGNOSIS — H2512 Age-related nuclear cataract, left eye: Secondary | ICD-10-CM | POA: Diagnosis not present

## 2022-11-10 DIAGNOSIS — H35039 Hypertensive retinopathy, unspecified eye: Secondary | ICD-10-CM | POA: Diagnosis not present

## 2022-11-10 DIAGNOSIS — H2511 Age-related nuclear cataract, right eye: Secondary | ICD-10-CM | POA: Diagnosis not present

## 2022-12-02 IMAGING — MG MM DIGITAL SCREENING BILAT W/ TOMO AND CAD
6 of 10 series · 6 of 30 positions shown · non-contrast
Comparison: Previous exam(s).

ACR Breast Density Category a: The breast tissue is almost entirely
fatty.

CLINICAL DATA: Screening.

EXAM:
DIGITAL SCREENING BILATERAL MAMMOGRAM WITH TOMOSYNTHESIS AND CAD
TECHNIQUE: Bilateral screening digital craniocaudal and mediolateral oblique
mammograms were obtained. Bilateral screening digital breast
tomosynthesis was performed. The images were evaluated with
computer-aided detection.

[L CC synth-2D]
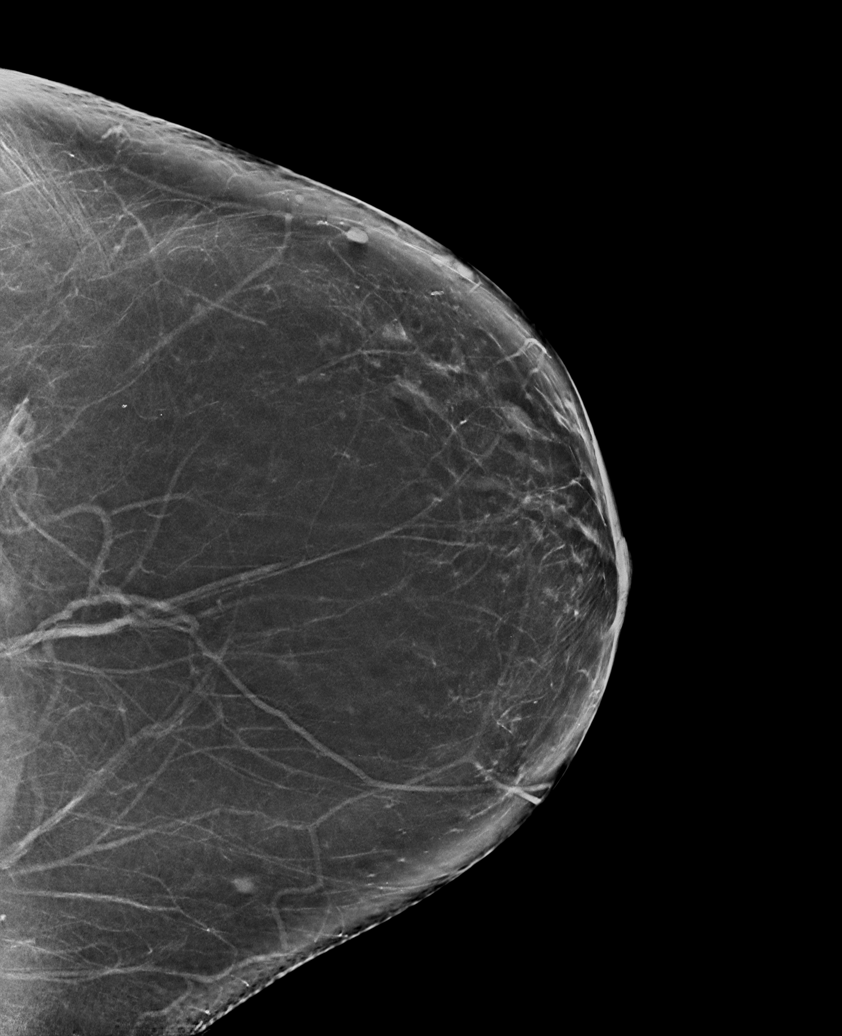

[R CC synth-2D]
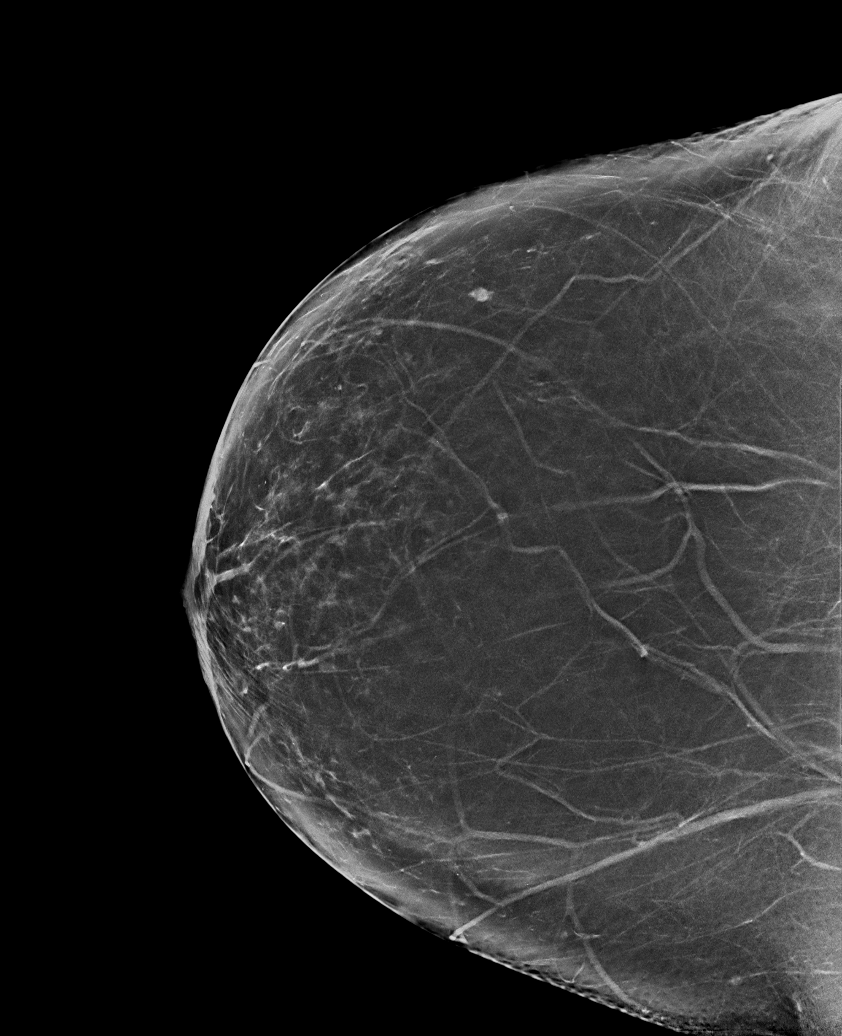

[L MLO synth-2D]
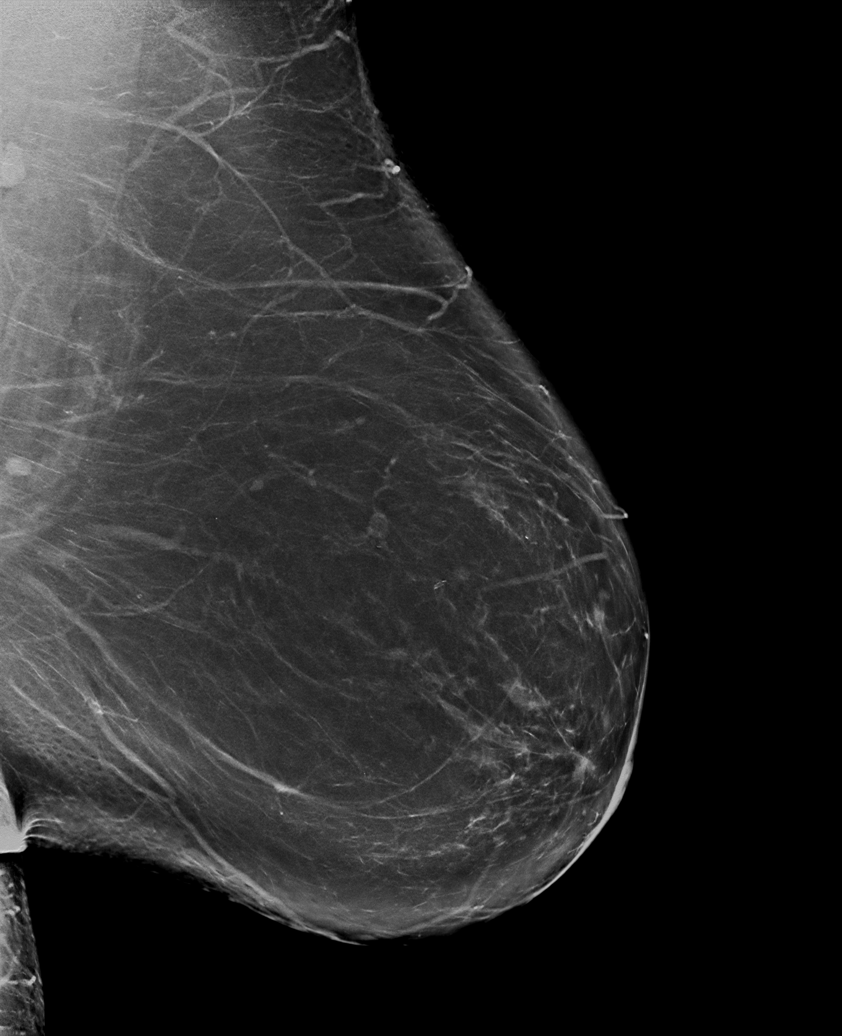

[R MLO synth-2D]
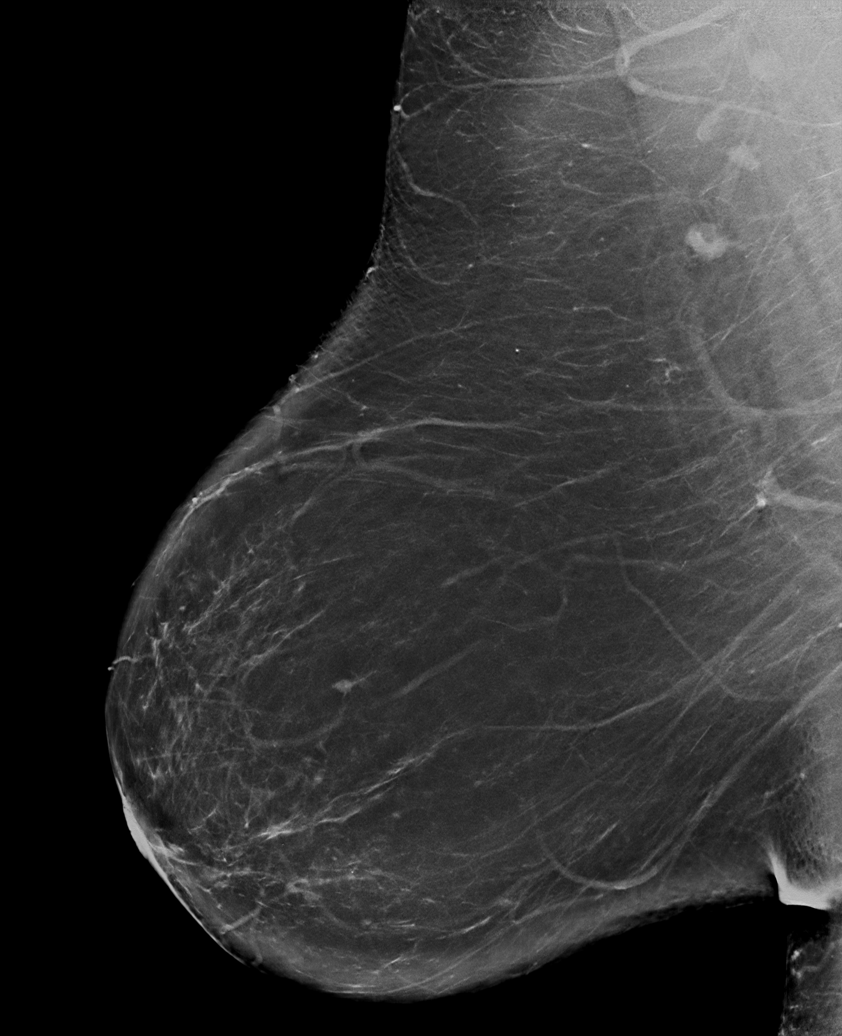

[R CV synth-2D]
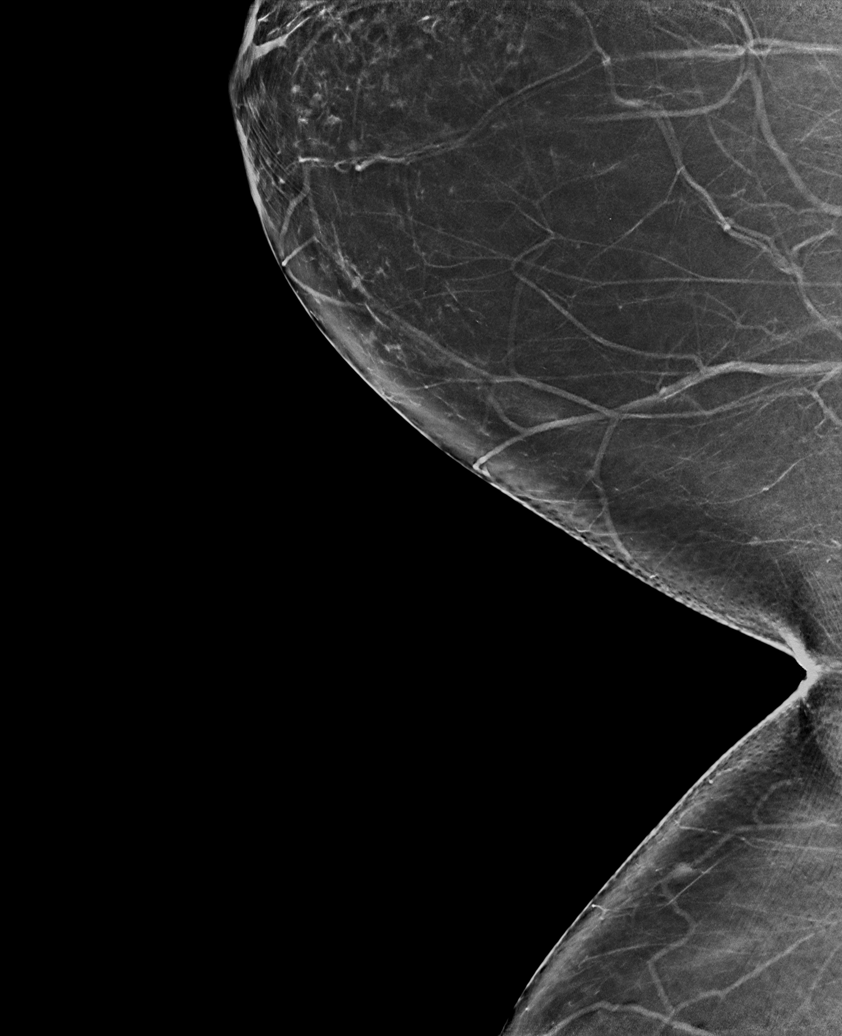

[R MLO tomo · tomo slice 51/100.0]
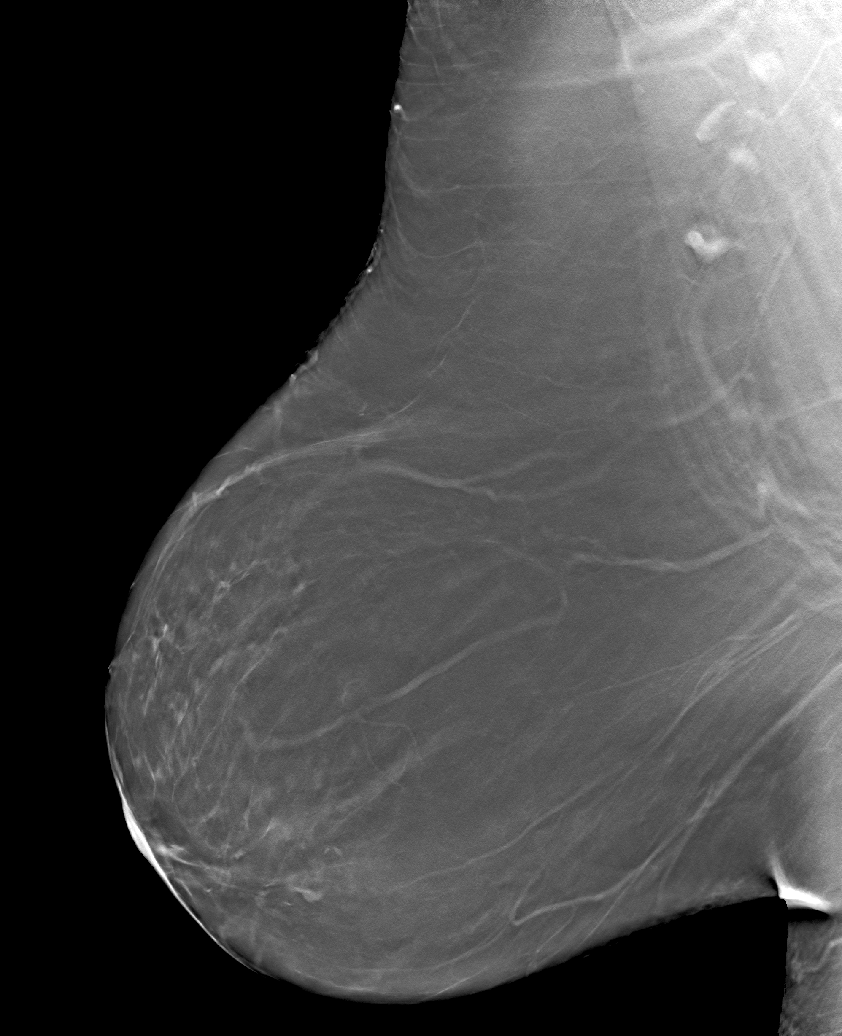

[6 of 30 positions shown; findings below may reference images not displayed]

FINDINGS: In the right breast, a possible focal asymmetry warrants further
evaluation. In the left breast, no findings suspicious for
malignancy.
IMPRESSION: Further evaluation is suggested for possible focal asymmetry in the
right breast.

RECOMMENDATION:
Diagnostic mammogram and possibly ultrasound of the right breast.
(Code:F9-S-99H)

The patient will be contacted regarding the findings, and additional
imaging will be scheduled.

BI-RADS CATEGORY  0: Incomplete. Need additional imaging evaluation
and/or prior mammograms for comparison.

## 2022-12-07 ENCOUNTER — Other Ambulatory Visit: Payer: Self-pay

## 2022-12-07 MED ORDER — SIMVASTATIN 20 MG PO TABS: 20.0000 mg | ORAL_TABLET | Freq: Every day | ORAL | 1 refills | Status: DC

## 2022-12-19 ENCOUNTER — Encounter: Payer: Self-pay | Admitting: Internal Medicine

## 2022-12-19 ENCOUNTER — Ambulatory Visit (INDEPENDENT_AMBULATORY_CARE_PROVIDER_SITE_OTHER): Payer: Medicare Other | Admitting: Internal Medicine

## 2022-12-19 VITALS — BP 134/84 | HR 76 | Temp 98.2°F | Ht 70.0 in | Wt 289.0 lb

## 2022-12-19 DIAGNOSIS — R490 Dysphonia: Secondary | ICD-10-CM | POA: Diagnosis not present

## 2022-12-19 DIAGNOSIS — R5383 Other fatigue: Secondary | ICD-10-CM

## 2022-12-19 DIAGNOSIS — R7303 Prediabetes: Secondary | ICD-10-CM | POA: Diagnosis not present

## 2022-12-19 DIAGNOSIS — K219 Gastro-esophageal reflux disease without esophagitis: Secondary | ICD-10-CM | POA: Diagnosis not present

## 2022-12-19 DIAGNOSIS — E782 Mixed hyperlipidemia: Secondary | ICD-10-CM

## 2022-12-19 DIAGNOSIS — D241 Benign neoplasm of right breast: Secondary | ICD-10-CM

## 2022-12-19 DIAGNOSIS — N6311 Unspecified lump in the right breast, upper outer quadrant: Secondary | ICD-10-CM

## 2022-12-19 DIAGNOSIS — E041 Nontoxic single thyroid nodule: Secondary | ICD-10-CM | POA: Diagnosis not present

## 2022-12-19 DIAGNOSIS — I10 Essential (primary) hypertension: Secondary | ICD-10-CM

## 2022-12-19 DIAGNOSIS — Z1231 Encounter for screening mammogram for malignant neoplasm of breast: Secondary | ICD-10-CM

## 2022-12-19 DIAGNOSIS — R197 Diarrhea, unspecified: Secondary | ICD-10-CM

## 2022-12-19 DIAGNOSIS — K760 Fatty (change of) liver, not elsewhere classified: Secondary | ICD-10-CM

## 2022-12-19 LAB — CBC WITH DIFFERENTIAL/PLATELET
Basophils Absolute: 0 10*3/uL (ref 0.0–0.1)
Basophils Relative: 0.6 % (ref 0.0–3.0)
Eosinophils Absolute: 0.1 10*3/uL (ref 0.0–0.7)
Eosinophils Relative: 1.7 % (ref 0.0–5.0)
HCT: 41 % (ref 36.0–46.0)
Hemoglobin: 13.5 g/dL (ref 12.0–15.0)
Lymphocytes Relative: 24.5 % (ref 12.0–46.0)
Lymphs Abs: 1.6 10*3/uL (ref 0.7–4.0)
MCHC: 32.8 g/dL (ref 30.0–36.0)
MCV: 84.2 fl (ref 78.0–100.0)
Monocytes Absolute: 0.4 10*3/uL (ref 0.1–1.0)
Monocytes Relative: 6.2 % (ref 3.0–12.0)
Neutro Abs: 4.3 10*3/uL (ref 1.4–7.7)
Neutrophils Relative %: 67 % (ref 43.0–77.0)
Platelets: 202 10*3/uL (ref 150.0–400.0)
RBC: 4.87 Mil/uL (ref 3.87–5.11)
RDW: 15.3 % (ref 11.5–15.5)
WBC: 6.4 10*3/uL (ref 4.0–10.5)

## 2022-12-19 LAB — MICROALBUMIN / CREATININE URINE RATIO
Creatinine,U: 42.3 mg/dL
Microalb Creat Ratio: 1.7 mg/g (ref 0.0–30.0)
Microalb, Ur: <0.7 mg/dL (ref 0.0–1.9)

## 2022-12-19 LAB — LIPID PANEL
Cholesterol: 161 mg/dL (ref 0–200)
HDL: 43.7 mg/dL (ref >39.00)
LDL Cholesterol: 79 mg/dL (ref 0–99)
NonHDL: 117.35
Total CHOL/HDL Ratio: 4
Triglycerides: 192 mg/dL — ABNORMAL HIGH (ref 0.0–149.0)
VLDL: 38.4 mg/dL (ref 0.0–40.0)

## 2022-12-19 LAB — COMPREHENSIVE METABOLIC PANEL
ALT: 18 U/L (ref 0–35)
AST: 16 U/L (ref 0–37)
Albumin: 4.3 g/dL (ref 3.5–5.2)
Alkaline Phosphatase: 44 U/L (ref 39–117)
BUN: 11 mg/dL (ref 6–23)
CO2: 31 mEq/L (ref 19–32)
Calcium: 9.5 mg/dL (ref 8.4–10.5)
Chloride: 101 mEq/L (ref 96–112)
Creatinine, Ser: 0.62 mg/dL (ref 0.40–1.20)
GFR: 95.75 mL/min (ref >60.00)
Glucose, Bld: 92 mg/dL (ref 70–99)
Potassium: 3.9 mEq/L (ref 3.5–5.1)
Sodium: 140 mEq/L (ref 135–145)
Total Bilirubin: 0.4 mg/dL (ref 0.2–1.2)
Total Protein: 7.4 g/dL (ref 6.0–8.3)

## 2022-12-19 LAB — TSH: TSH: 0.73 u[IU]/mL (ref 0.35–5.50)

## 2022-12-19 LAB — LDL CHOLESTEROL, DIRECT: Direct LDL: 106 mg/dL

## 2022-12-19 LAB — HEMOGLOBIN A1C: Hgb A1c MFr Bld: 6 % (ref 4.6–6.5)

## 2022-12-19 MED ORDER — CELECOXIB 200 MG PO CAPS
200.0000 mg | ORAL_CAPSULE | Freq: Two times a day (BID) | ORAL | 1 refills | Status: DC
Start: 2022-12-19 — End: 2023-06-22

## 2022-12-19 MED ORDER — SIMVASTATIN 20 MG PO TABS
20.0000 mg | ORAL_TABLET | Freq: Every day | ORAL | 1 refills | Status: DC
Start: 2022-12-19 — End: 2023-06-22

## 2022-12-19 MED ORDER — OMEPRAZOLE 20 MG PO CPDR: DELAYED_RELEASE_CAPSULE | ORAL | 3 refills | Status: DC

## 2022-12-19 MED ORDER — SPIRONOLACTONE 50 MG PO TABS
ORAL_TABLET | ORAL | 3 refills | Status: DC
Start: 2022-12-19 — End: 2024-02-19

## 2022-12-19 NOTE — Assessment & Plan Note (Addendum)
Statin therapy was advised and started in 2017.  She had stopped the medication  due to elevated liver enzymes ,  but has resumed since LFTs have normalized .   Lab Results  Component Value Date   CHOL 161 12/19/2022   HDL 43.70 12/19/2022   LDLCALC 79 12/19/2022   LDLDIRECT 106.0 12/19/2022   TRIG 192.0 (H) 12/19/2022   CHOLHDL 4 12/19/2022   Lab Results  Component Value Date   ALT 18 12/19/2022   AST 16 12/19/2022   ALKPHOS 44 12/19/2022   BILITOT 0.4 12/19/2022

## 2022-12-19 NOTE — Assessment & Plan Note (Addendum)
Her obesity is aggravating her back pain.  We Reviewed prior successful weight loss trials.  Lost weight with ozempic  but stopped due to insurance non payment.  Also developed neck enlargement ,  left sided , still smoking .  seeing ENT today.  Counselling on diet  given.

## 2022-12-19 NOTE — Assessment & Plan Note (Addendum)
Managed with statin, metformin,  and low GI diet  LFTs are normal.  Suspending metformin due to diarrhea   Lab Results  Component Value Date   ALT 18 12/19/2022   AST 16 12/19/2022   ALKPHOS 44 12/19/2022   BILITOT 0.4 12/19/2022

## 2022-12-19 NOTE — Assessment & Plan Note (Signed)
Advised to check BP at home and submit readings

## 2022-12-19 NOTE — Progress Notes (Signed)
Subjective:  Patient ID: Lisa Valencia, female    DOB: August 01, 1960  Age: 62 y.o. MRN: [id number]  CC: The primary encounter diagnosis was Encounter for screening mammogram for malignant neoplasm of breast. Diagnoses of Prediabetes, Mixed hyperlipidemia, Other fatigue, Morbid obesity (HCC), Papilloma of right breast, Diarrhea, unspecified type, Mass of upper outer quadrant of right breast, Essential hypertension, and Hepatic steatosis were also pertinent to this visit.   HPI Lisa Valencia presents for  Chief Complaint  Patient presents with   Medical Management of Chronic Issues    6 month follow up    1) Morbid obesity: not exercising,  not on ozempic.  Doesn't want meds.  Does yard work once a week. Jamal Maes the GOLO diet in March  but went off dof it ue to illness and to much diarreha oc urring after meals containing vegetables.    lost 7 lbs  while on it.    2) low back pain : chronic,  activity limiting. Managed with celebrex   3) right breast papilloma  due for follow up   diagnostic  mammogram.   4) elevated bp: :  home readings? Not checked   5) Prolonged URI in March lasted 3 weeks .  Took doxycyline  .  April:  had allergy symptoms   6) enlarging nodule? Thyroid left side,  voice raspy,  seeing Juengel this afternoon .  First noted when she took Ozempic for 6-7 months   7)  persistent loose stools occurring sporadically for the last 1.5 years.  Always occurring with 30 minutes of eating.  At times watery   Outpatient Medications Prior to Visit  Medication Sig Dispense Refill   amoxicillin (AMOXIL) 500 MG capsule TAKE 4 CAPSULES ONE HOUR PRIOR TO DENTALPROCEDURE 12 capsule 3   aspirin EC 81 MG tablet Take 81 mg by mouth daily.     cetirizine (ZYRTEC) 10 MG tablet Take 10 mg by mouth daily.     cholecalciferol (VITAMIN D3) 25 MCG (1000 UNIT) tablet Take 1,000 Units by mouth daily.     conjugated estrogens (PREMARIN) vaginal cream Place one applicatorful vaginally two to  three times weekly 90 g 3   metFORMIN (GLUCOPHAGE-XR) 500 MG 24 hr tablet Take 1 tablet (500 mg total) by mouth daily. 90 tablet 3   methocarbamol (ROBAXIN) 500 MG tablet Take 1 tablet (500 mg total) by mouth 4 (four) times daily. 90 tablet 0   Omega-3 Fatty Acids (FISH OIL) 1000 MG CAPS Take 1,000 mg by mouth daily.      vitamin E 200 UNIT capsule Take 200 Units by mouth daily.     celecoxib (CELEBREX) 200 MG capsule Take 1 capsule (200 mg total) by mouth 2 (two) times daily. 180 capsule 1   omeprazole (PRILOSEC) 20 MG capsule TAKE ONE CAPSULE BY MOUTH TWICE DAILY BEFORE A MEAL 180 capsule 3   simvastatin (ZOCOR) 20 MG tablet Take 1 tablet (20 mg total) by mouth at bedtime. 90 tablet 1   spironolactone (ALDACTONE) 50 MG tablet TAKE 1 TABLET BY MOUTH DAILY. GENERIC EQUIVALENT FOR ALDACTONE 90 tablet 3   benzonatate (TESSALON PERLES) 100 MG capsule Take 1 capsule (100 mg total) by mouth 3 (three) times daily as needed. (Patient not taking: Reported on 12/19/2022) 20 capsule 0   No facility-administered medications prior to visit.    Review of Systems;  Patient denies headache, fevers, malaise, unintentional weight loss, skin rash, eye pain, sinus congestion and sinus pain, sore throat,  dysphagia,  hemoptysis , cough, dyspnea, wheezing, chest pain, palpitations, orthopnea, edema, abdominal pain, nausea, melena, diarrhea, constipation, flank pain, dysuria, hematuria, urinary  Frequency, nocturia, numbness, tingling, seizures,  Focal weakness, Loss of consciousness,  Tremor, insomnia, depression, anxiety, and suicidal ideation.      Objective:  BP 134/84   Pulse 76   Temp 98.2 F (36.8 C) (Oral)   Ht 5\' 10"  (1.778 m)   Wt 289 lb (131.1 kg)   SpO2 94%   BMI 41.47 kg/m   BP Readings from Last 3 Encounters:  12/19/22 134/84  06/02/22 134/80  01/03/22 138/84    Wt Readings from Last 3 Encounters:  12/19/22 289 lb (131.1 kg)  10/06/22 287 lb (130.2 kg)  09/02/22 291 lb (132 kg)     Physical Exam  Lab Results  Component Value Date   HGBA1C 6.0 12/19/2022   HGBA1C 6.2 06/02/2022   HGBA1C 5.9 12/28/2021    Lab Results  Component Value Date   CREATININE 0.62 12/19/2022   CREATININE 0.66 06/02/2022   CREATININE 0.79 12/28/2021    Lab Results  Component Value Date   WBC 6.4 12/19/2022   HGB 13.5 12/19/2022   HCT 41.0 12/19/2022   PLT 202.0 12/19/2022   GLUCOSE 92 12/19/2022   CHOL 161 12/19/2022   TRIG 192.0 (H) 12/19/2022   HDL 43.70 12/19/2022   LDLDIRECT 106.0 12/19/2022   LDLCALC 79 12/19/2022   ALT 18 12/19/2022   AST 16 12/19/2022   NA 140 12/19/2022   K 3.9 12/19/2022   CL 101 12/19/2022   CREATININE 0.62 12/19/2022   BUN 11 12/19/2022   CO2 31 12/19/2022   TSH 0.73 12/19/2022   INR 0.90 04/19/2018   HGBA1C 6.0 12/19/2022   MICROALBUR <0.7 12/19/2022    [place] RT BREAST BX W LOC DEV 1ST LESION IMG BX SPEC US GUIDE  Addendum Date: 01/28/2022   ADDENDUM REPORT: 01/28/2022 13:43 ADDENDUM: PATHOLOGY revealed: A. BREAST, RIGHT AT 9:00, 6 CM FROM THE NIPPLE; ULTRASOUND-GUIDED CORE NEEDLE BIOPSY: - FRAGMENTS OF BENIGN INTRADUCTAL PAPILLOMA WITH FOCAL USUAL DUCTAL HYPERPLASIA. - NO DEFINITE EVIDENCE OF ATYPICAL PROLIFERATIVE BREAST DISEASE. Comment: Although definite atypia is not identified in the current sampling, the presence of an un-sampled atypical component cannot be entirely excluded. Definitive classification would require complete evaluation of the lesion. Pathology results are CONCORDANT with imaging findings, per Dr. Baird Lyons with excision recommended. Pathology results and recommendations were discussed with patient via telephone on 01/27/2022. Patient reported biopsy site doing well with no adverse symptoms, and only slight tenderness at the site. Post biopsy care instructions were reviewed, questions were answered and my direct phone number was provided. Patient was instructed to call [facility] for any additional questions or  concerns related to biopsy site. RECOMMENDATIONS: Surgical consultation for possible excision. Request for surgical consultation relayed to Irving Shows RN at [facility] by Randa Lynn RN on 01/28/2022. Pathology results reported by Randa Lynn RN on 01/28/2022. Electronically Signed   By: Baird Lyons M.D.   On: 01/28/2022 13:43   Result Date: 01/28/2022 CLINICAL DATA:  Right breast mass. EXAM: ULTRASOUND GUIDED RIGHT BREAST CORE NEEDLE BIOPSY COMPARISON:  Previous exam(s). PROCEDURE: I met with the patient and we discussed the procedure of ultrasound-guided biopsy, including benefits and alternatives. We discussed the high likelihood of a successful procedure. We discussed the risks of the procedure, including infection, bleeding, tissue injury, clip migration, and inadequate sampling. Informed written consent was given. The usual time-out  protocol was performed immediately prior to the procedure. Lesion quadrant: 9 o'clock Using sterile technique and 1% Lidocaine as local anesthetic, under direct ultrasound visualization, a 14 gauge spring-loaded device was used to perform biopsy of a mass in the 9 o'clock region of the right breast using a lateral to medial approach. At the conclusion of the procedure venous shaped tissue marker clip was deployed into the biopsy cavity. Follow up 2 view mammogram was performed and dictated separately. IMPRESSION: Ultrasound guided biopsy of the right breast. No apparent complications. Electronically Signed: By: Baird Lyons M.D. On: 01/26/2022 13:44  MM CLIP PLACEMENT RIGHT  Result Date: 01/26/2022 CLINICAL DATA:  Status post ultrasound-guided core biopsy of a right breast mass. EXAM: 3D DIAGNOSTIC RIGHT MAMMOGRAM POST ULTRASOUND BIOPSY COMPARISON:  Previous exam(s). FINDINGS: 3D Mammographic images were obtained following ultrasound guided biopsy of . The biopsy marking clip is in expected position at the site of biopsy. IMPRESSION: Appropriate positioning of  the venous shaped biopsy marking clip at the site of biopsy in the lateral slightly upper aspect of the right breast. Final Assessment: Post Procedure Mammograms for Marker Placement Electronically Signed   By: Baird Lyons M.D.   On: 01/26/2022 13:53   Assessment & Plan:  .Encounter for screening mammogram for malignant neoplasm of breast -     3D Screening Mammogram, Left and Right; Future  Prediabetes -     Microalbumin / creatinine urine ratio -     Hemoglobin A1c -     Comprehensive metabolic panel  Mixed hyperlipidemia Assessment & Plan: Statin therapy was advised and started in 2017.  She had stopped the medication  due to elevated liver enzymes ,  but has resumed since LFTs have normalized .   Lab Results  Component Value Date   CHOL 161 12/19/2022   HDL 43.70 12/19/2022   LDLCALC 79 12/19/2022   LDLDIRECT 106.0 12/19/2022   TRIG 192.0 (H) 12/19/2022   CHOLHDL 4 12/19/2022   Lab Results  Component Value Date   ALT 18 12/19/2022   AST 16 12/19/2022   ALKPHOS 44 12/19/2022   BILITOT 0.4 12/19/2022     Orders: -     Lipid panel -     LDL cholesterol, direct  Other fatigue -     CBC with Differential/Platelet -     TSH  Morbid obesity (HCC) Assessment & Plan: Her obesity is aggravating her back pain.  We Reviewed prior successful weight loss trials.  Lost weight with ozempic  but stopped due to insurance non payment.  Also developed neck enlargement ,  left sided , still smoking .  seeing ENT today.  Counselling on diet  given.    Papilloma of right breast -     MM 3D DIAGNOSTIC MAMMOGRAM BILATERAL BREAST; Future -     [place] LIMITED ULTRASOUND INCLUDING AXILLA RIGHT BREAST; Future  Diarrhea, unspecified type -     Ambulatory referral to Gastroenterology  Mass of upper outer quadrant of right breast Assessment & Plan: Papilloma right breast .  Follow up diagnostic mammogram is ue    Essential hypertension Assessment & Plan: Advised to check BP at home and  submit readings    Hepatic steatosis Assessment & Plan: Managed with statin, metformin,  and low GI diet  LFTs are normal.  Suspending metformin due to diarrhea   Lab Results  Component Value Date   ALT 18 12/19/2022   AST 16 12/19/2022   ALKPHOS 44 12/19/2022   BILITOT 0.4  12/19/2022      Other orders -     Celecoxib; Take 1 capsule (200 mg total) by mouth 2 (two) times daily.  Dispense: 180 capsule; Refill: 1 -     Omeprazole; TAKE ONE CAPSULE BY MOUTH TWICE DAILY BEFORE A MEAL  Dispense: 180 capsule; Refill: 3 -     Spironolactone; TAKE 1 TABLET BY MOUTH DAILY. GENERIC EQUIVALENT FOR ALDACTONE  Dispense: 90 tablet; Refill: 3 -     Simvastatin; Take 1 tablet (20 mg total) by mouth at bedtime.  Dispense: 90 tablet; Refill: 1     I provided 51 minutes of face-to-face time during this encounter reviewing patient's last visit with me, patient's  most recent visit with cardiology,  nephrology,  and neurology,  recent surgical and non surgical procedures, previous  labs and imaging studies, counseling on currently addressed issues,  and post visit ordering to diagnostics and therapeutics .   Follow-up: Return in about 3 months (around 03/21/2023).   Sherlene Shams, MD

## 2022-12-19 NOTE — Assessment & Plan Note (Signed)
Papilloma right breast .  Follow up diagnostic mammogram is ue

## 2022-12-19 NOTE — Patient Instructions (Addendum)
Please check blood pressure at home and send me readings via mychart  Gi referral made for the diarrhea:  if the wait is too long , please schedule a follow up with me to workup    You know how to lose weight.  It's not rocket science.   You need to get back on a sensible diet and exercise plan and get this weight off   There are low carb options for breakfast :   Veggies Made Great ! Makes a low carb spinach & egg white frittata :  70 cal,  eat 2 or 3   .      try the premixed protein drink called Premier Protein shake in the morning.  It is less $$$ and very low sugar.   160 cal  30 g protein  1 g sugar 50% calcium needs    For lunch or dinner:  [employer] "low carb power bowl"  entrees and  "Steamer" entrees are are great low carb entrees that microwave in 5 minutes     Make Fajitas!   use [place] low carb tortillas    Use a bulk forming laxative to fill up before your biggest meal.

## 2022-12-22 ENCOUNTER — Other Ambulatory Visit: Payer: Self-pay | Admitting: Otolaryngology

## 2022-12-22 DIAGNOSIS — E041 Nontoxic single thyroid nodule: Secondary | ICD-10-CM

## 2022-12-27 ENCOUNTER — Ambulatory Visit
Admission: RE | Admit: 2022-12-27 | Discharge: 2022-12-27 | Disposition: A | Discharge status: Home / Self Care | Payer: Medicare Other | Source: Ambulatory Visit | Attending: Otolaryngology | Admitting: Otolaryngology

## 2022-12-27 DIAGNOSIS — R221 Localized swelling, mass and lump, neck: Secondary | ICD-10-CM | POA: Diagnosis not present

## 2022-12-27 DIAGNOSIS — E041 Nontoxic single thyroid nodule: Secondary | ICD-10-CM | POA: Diagnosis not present

## 2022-12-29 ENCOUNTER — Other Ambulatory Visit: Payer: Self-pay | Admitting: Otolaryngology

## 2022-12-29 DIAGNOSIS — E041 Nontoxic single thyroid nodule: Secondary | ICD-10-CM

## 2023-01-02 ENCOUNTER — Ambulatory Visit: Payer: Medicare Other

## 2023-01-03 NOTE — Progress Notes (Signed)
Patient for [place] FNA Bil Thyroid Nodules Biopsy on Wed 01/04/2023, I called and spoke with the patient on the phone and gave pre-procedure instructions. Pt was made aware to be here at 12:30p and check in at the [facility] registration desk. Pt stated understanding.  Called 01/03/2023

## 2023-01-04 ENCOUNTER — Ambulatory Visit
Admission: RE | Admit: 2023-01-04 | Discharge: 2023-01-04 | Disposition: A | Discharge status: Home / Self Care | Payer: Medicare Other | Source: Ambulatory Visit | Attending: Otolaryngology | Admitting: Otolaryngology

## 2023-01-04 DIAGNOSIS — D34 Benign neoplasm of thyroid gland: Secondary | ICD-10-CM | POA: Insufficient documentation

## 2023-01-04 DIAGNOSIS — E041 Nontoxic single thyroid nodule: Secondary | ICD-10-CM | POA: Diagnosis not present

## 2023-01-04 DIAGNOSIS — E042 Nontoxic multinodular goiter: Secondary | ICD-10-CM | POA: Diagnosis not present

## 2023-01-04 MED ORDER — LIDOCAINE HCL (PF) 1 % IJ SOLN
15.0000 mL | Freq: Once | INTRAMUSCULAR | Status: AC
  Administered 2023-01-04: 15 mL via SUBCUTANEOUS
  Filled 2023-01-04: qty 15

## 2023-01-04 NOTE — Discharge Instructions (Signed)
Instructions discussed with patient. PCS 

## 2023-01-04 NOTE — Procedures (Signed)
Successful US guided FNA of R mid thyroid nodule Successful US guided FNA of L mid thyroid nodule No complications. See PACS for full report.    Alex Gardener, AGNP-BC 01/04/2023, 2:31 PM

## 2023-01-05 ENCOUNTER — Other Ambulatory Visit: Payer: Medicare Other

## 2023-01-12 ENCOUNTER — Ambulatory Visit
Admission: RE | Admit: 2023-01-12 | Discharge: 2023-01-12 | Disposition: A | Discharge status: Home / Self Care | Payer: Medicare Other | Source: Ambulatory Visit | Attending: Internal Medicine | Admitting: Internal Medicine

## 2023-01-12 DIAGNOSIS — D241 Benign neoplasm of right breast: Secondary | ICD-10-CM | POA: Insufficient documentation

## 2023-01-12 DIAGNOSIS — R92323 Mammographic fibroglandular density, bilateral breasts: Secondary | ICD-10-CM | POA: Diagnosis not present

## 2023-01-12 DIAGNOSIS — N6311 Unspecified lump in the right breast, upper outer quadrant: Secondary | ICD-10-CM | POA: Diagnosis not present

## 2023-01-12 DIAGNOSIS — D369 Benign neoplasm, unspecified site: Secondary | ICD-10-CM | POA: Diagnosis not present

## 2023-01-12 DIAGNOSIS — N6315 Unspecified lump in the right breast, overlapping quadrants: Secondary | ICD-10-CM | POA: Diagnosis not present

## 2023-01-14 NOTE — Assessment & Plan Note (Signed)
No change by follow up diagnostic mammogram/ultrasound.  Repeat diagnostic bilateral mammogram in June 2025

## 2023-02-27 ENCOUNTER — Other Ambulatory Visit: Payer: Self-pay | Admitting: Otolaryngology

## 2023-02-27 DIAGNOSIS — E041 Nontoxic single thyroid nodule: Secondary | ICD-10-CM

## 2023-03-01 ENCOUNTER — Ambulatory Visit
Admission: RE | Admit: 2023-03-01 | Discharge: 2023-03-01 | Disposition: A | Discharge status: Home / Self Care | Payer: Medicare Other | Source: Ambulatory Visit | Attending: Otolaryngology | Admitting: Otolaryngology

## 2023-03-01 DIAGNOSIS — E049 Nontoxic goiter, unspecified: Secondary | ICD-10-CM | POA: Diagnosis not present

## 2023-03-01 DIAGNOSIS — E041 Nontoxic single thyroid nodule: Secondary | ICD-10-CM

## 2023-03-01 MED ORDER — IOPAMIDOL (ISOVUE-300) INJECTION 61%
75.0000 mL | Freq: Once | INTRAVENOUS | Status: AC | PRN
  Administered 2023-03-01: 75 mL via INTRAVENOUS

## 2023-03-14 DIAGNOSIS — R490 Dysphonia: Secondary | ICD-10-CM | POA: Diagnosis not present

## 2023-03-14 DIAGNOSIS — E041 Nontoxic single thyroid nodule: Secondary | ICD-10-CM | POA: Diagnosis not present

## 2023-03-28 DIAGNOSIS — F17210 Nicotine dependence, cigarettes, uncomplicated: Secondary | ICD-10-CM | POA: Diagnosis not present

## 2023-03-28 DIAGNOSIS — M5136 Other intervertebral disc degeneration, lumbar region: Secondary | ICD-10-CM | POA: Diagnosis not present

## 2023-04-10 ENCOUNTER — Ambulatory Visit: Payer: Medicare Other | Admitting: Gastroenterology

## 2023-04-10 ENCOUNTER — Encounter: Payer: Self-pay | Admitting: Gastroenterology

## 2023-04-10 VITALS — BP 129/84 | HR 92 | Temp 98.5°F | Ht 70.0 in | Wt 292.0 lb

## 2023-04-10 DIAGNOSIS — R194 Change in bowel habit: Secondary | ICD-10-CM | POA: Diagnosis not present

## 2023-04-10 NOTE — Progress Notes (Signed)
Gastroenterology Consultation  Referring Provider:     Sherlene Shams, MD Primary Care Physician:  Sherlene Shams, MD Primary Gastroenterologist:  Dr. Servando Valencia     Reason for Consultation:     Loose stools        HPI:   Lisa Valencia is a 62 y.o. y/o female referred for consultation & management of loose stools by Dr. Darrick Huntsman, Mar Daring, MD. This patient comes to see me today after being seen by her primary care provider back in June.  At that time the patient was reporting loose stools that were sometimes watery.  This would occur approximately 30 minutes after eating at times.  Back in June the patient was reporting that the symptoms had been going on for a year and a half.  The patient had a colonoscopy by me back in 2016 with a hyperplastic polyp found.  She had also seen me in the past for fatty liver.  Back in June her liver enzymes were normal. The patient reports that she has been on a low-carb diet and her diarrhea has gotten better.  She also states that she had been found to have a very large thyroid and is being evaluated for having it removed.  The patient denies any rectal bleeding unexplained weight loss fevers chills nausea or vomiting.  Past Medical History:  Diagnosis Date   Anxiety    Arthritis    Depression    Diverticulosis of intestine 2010   iftikhar   Esophagitis, reflux 2010    EGD by iftkihar, managed wiht dexilant   GERD (gastroesophageal reflux disease)    Impaired fasting glucose 01/25/2012   Needs eye exam.      Past Surgical History:  Procedure Laterality Date   ABDOMINAL HYSTERECTOMY  2009   supracervical    APPENDECTOMY     BREAST BIOPSY Right 01/26/2022   papilloma   BREAST CYST ASPIRATION Right    rt fna-neg   COLONOSCOPY WITH PROPOFOL N/A 06/05/2015   Procedure: COLONOSCOPY WITH PROPOFOL;  Surgeon: Midge Minium, MD;  [facility];  Service: Endoscopy;  Laterality: N/A;  Pt requests early. Pt for antibiotic pre-procedure  CECAL  PROTRUSION BX. / WITH CLIP LOT # [id number] RESOLUTION 360 EXP 03/03/2016 SIGMOID POLYP   JOINT REPLACEMENT Right 2012   hip, [name]   TOTAL HIP ARTHROPLASTY Left 05/02/2018   Procedure: TOTAL HIP ARTHROPLASTY;  Surgeon: Donato Heinz, MD;  Location: [facility];  Service: Orthopedics;  Laterality: Left;    Prior to Admission medications   Medication Sig Start Date End Date Taking? Authorizing Provider  amoxicillin (AMOXIL) 500 MG capsule TAKE 4 CAPSULES ONE HOUR PRIOR TO [facility] 06/02/22   Sherlene Shams, MD  aspirin EC 81 MG tablet Take 81 mg by mouth daily.    [provider]  celecoxib (CELEBREX) 200 MG capsule Take 1 capsule (200 mg total) by mouth 2 (two) times daily. 12/19/22   Sherlene Shams, MD  cetirizine (ZYRTEC) 10 MG tablet Take 10 mg by mouth daily.    [provider]  cholecalciferol (VITAMIN D3) 25 MCG (1000 UNIT) tablet Take 1,000 Units by mouth daily.    [provider]  conjugated estrogens (PREMARIN) vaginal cream Place one applicatorful vaginally two to three times weekly 09/14/20   Sherlene Shams, MD  metFORMIN (GLUCOPHAGE-XR) 500 MG 24 hr tablet Take 1 tablet (500 mg total) by mouth daily. 10/06/22   Sherlene Shams, MD  methocarbamol (ROBAXIN)  500 MG tablet Take 1 tablet (500 mg total) by mouth 4 (four) times daily. 06/02/22   Sherlene Shams, MD  Omega-3 Fatty Acids (FISH OIL) 1000 MG CAPS Take 1,000 mg by mouth daily.     [provider]  omeprazole (PRILOSEC) 20 MG capsule TAKE ONE CAPSULE BY MOUTH TWICE DAILY BEFORE A MEAL 12/19/22   Sherlene Shams, MD  simvastatin (ZOCOR) 20 MG tablet Take 1 tablet (20 mg total) by mouth at bedtime. 12/19/22   Sherlene Shams, MD  spironolactone (ALDACTONE) 50 MG tablet TAKE 1 TABLET BY MOUTH DAILY. GENERIC EQUIVALENT FOR ALDACTONE 12/19/22   Sherlene Shams, MD  vitamin E 200 UNIT capsule Take 200 Units by mouth daily.    [provider]    Family History  Problem Relation Age of  Onset   Cancer Lisa Valencia        lung ca   Hyperlipidemia Lisa Valencia        PAD   Diabetes Sister    Cancer Paternal Aunt        breast   Breast cancer Paternal Aunt 95   Diabetes Maternal Grandfather    Cancer Maternal Grandfather        colon ca   Cancer Paternal Grandmother        colon   Cancer Paternal Grandfather        colon ca   Heart disease Neg Hx      Social History   Tobacco Use   Smoking status: Former    Current packs/day: 1.00    Average packs/day: 1 pack/day for 35.0 years (35.0 ttl pk-yrs)    Types: Cigarettes   Smokeless tobacco: Never  Vaping Use   Vaping status: Never Used  Substance Use Topics   Alcohol use: Not Currently   Drug use: No    Allergies as of 04/10/2023 - Review Complete 12/19/2022  Allergen Reaction Noted   Sulfa antibiotics Swelling and Other (See Comments) 01/25/2012   Chantix [varenicline] Nausea Only 02/14/2020   Codeine sulfate Nausea Only 05/19/2015   Fenofibrate Other (See Comments) 03/05/2014   Pseudoephedrine hcl Palpitations 12/06/2013   Wellbutrin [bupropion] Hives 01/25/2012    Review of Systems:    All systems reviewed and negative except where noted in HPI.   Physical Exam:  There were no vitals taken for this visit. No LMP recorded. Patient has had a hysterectomy. General:   Alert,  Well-developed, well-nourished, pleasant and cooperative in NAD Head:  Normocephalic and atraumatic. Eyes:  Sclera clear, no icterus.   Conjunctiva pink. Ears:  Normal auditory acuity. Neurologic:  Alert and oriented x3;  grossly normal neurologically. Psych:  Alert and cooperative. Normal mood and affect.  Imaging Studies: No results found.  Assessment and Plan:   CHASTINA Lisa Valencia is a 62 y.o. y/o female who comes in today with a history of diarrhea that has been better lately.  The patient states that she thought she was due for colonoscopy but was informed that her last colonoscopy was 2016.  The patient was wondering whether she  should have a colonoscopy now or wait until after her thyroid issues are resolved.  The patient has been told that since her bowel movements are better now and she has not had any rectal bleeding or unexplained weight loss she should follow-up with her thyroid issues and call for a colonoscopy appointment when the thyroid issues are resolved.  The patient has been explained the plan and agrees with it.  Midge Minium, MD. Clementeen Graham    Note: This dictation was prepared with Dragon dictation along with smaller phrase technology. Any transcriptional errors that result from this process are unintentional.

## 2023-04-18 DIAGNOSIS — E041 Nontoxic single thyroid nodule: Secondary | ICD-10-CM | POA: Diagnosis not present

## 2023-04-19 DIAGNOSIS — Z888 Allergy status to other drugs, medicaments and biological substances status: Secondary | ICD-10-CM | POA: Diagnosis not present

## 2023-04-19 DIAGNOSIS — K219 Gastro-esophageal reflux disease without esophagitis: Secondary | ICD-10-CM | POA: Diagnosis not present

## 2023-04-19 DIAGNOSIS — Z882 Allergy status to sulfonamides status: Secondary | ICD-10-CM | POA: Diagnosis not present

## 2023-04-19 DIAGNOSIS — Z79899 Other long term (current) drug therapy: Secondary | ICD-10-CM | POA: Diagnosis not present

## 2023-04-19 DIAGNOSIS — E041 Nontoxic single thyroid nodule: Secondary | ICD-10-CM | POA: Diagnosis not present

## 2023-04-19 DIAGNOSIS — F17210 Nicotine dependence, cigarettes, uncomplicated: Secondary | ICD-10-CM | POA: Diagnosis not present

## 2023-04-19 DIAGNOSIS — Z885 Allergy status to narcotic agent status: Secondary | ICD-10-CM | POA: Diagnosis not present

## 2023-04-19 DIAGNOSIS — Z7982 Long term (current) use of aspirin: Secondary | ICD-10-CM | POA: Diagnosis not present

## 2023-05-02 DIAGNOSIS — Z96643 Presence of artificial hip joint, bilateral: Secondary | ICD-10-CM | POA: Diagnosis not present

## 2023-05-02 DIAGNOSIS — M1711 Unilateral primary osteoarthritis, right knee: Secondary | ICD-10-CM | POA: Diagnosis not present

## 2023-05-06 DIAGNOSIS — M1711 Unilateral primary osteoarthritis, right knee: Secondary | ICD-10-CM | POA: Insufficient documentation

## 2023-05-23 DIAGNOSIS — K080 Exfoliation of teeth due to systemic causes: Secondary | ICD-10-CM | POA: Diagnosis not present

## 2023-05-29 DIAGNOSIS — Z96649 Presence of unspecified artificial hip joint: Secondary | ICD-10-CM | POA: Diagnosis not present

## 2023-05-29 DIAGNOSIS — I10 Essential (primary) hypertension: Secondary | ICD-10-CM | POA: Diagnosis not present

## 2023-05-29 DIAGNOSIS — Z6841 Body Mass Index (BMI) 40.0 and over, adult: Secondary | ICD-10-CM | POA: Diagnosis not present

## 2023-05-29 DIAGNOSIS — E041 Nontoxic single thyroid nodule: Secondary | ICD-10-CM | POA: Diagnosis not present

## 2023-05-29 DIAGNOSIS — Z87891 Personal history of nicotine dependence: Secondary | ICD-10-CM | POA: Diagnosis not present

## 2023-05-29 DIAGNOSIS — E042 Nontoxic multinodular goiter: Secondary | ICD-10-CM | POA: Diagnosis not present

## 2023-05-29 DIAGNOSIS — K219 Gastro-esophageal reflux disease without esophagitis: Secondary | ICD-10-CM | POA: Diagnosis not present

## 2023-06-02 DIAGNOSIS — Z4803 Encounter for change or removal of drains: Secondary | ICD-10-CM | POA: Diagnosis not present

## 2023-06-07 DIAGNOSIS — E890 Postprocedural hypothyroidism: Secondary | ICD-10-CM | POA: Diagnosis not present

## 2023-06-13 ENCOUNTER — Other Ambulatory Visit: Payer: Self-pay | Admitting: Otolaryngology

## 2023-06-13 DIAGNOSIS — R1310 Dysphagia, unspecified: Secondary | ICD-10-CM

## 2023-06-22 ENCOUNTER — Encounter: Payer: Self-pay | Admitting: Internal Medicine

## 2023-06-22 ENCOUNTER — Ambulatory Visit: Payer: Medicare Other | Admitting: Internal Medicine

## 2023-06-22 VITALS — BP 120/78 | HR 75 | Ht 70.0 in | Wt 299.6 lb

## 2023-06-22 DIAGNOSIS — N6311 Unspecified lump in the right breast, upper outer quadrant: Secondary | ICD-10-CM

## 2023-06-22 DIAGNOSIS — E890 Postprocedural hypothyroidism: Secondary | ICD-10-CM

## 2023-06-22 DIAGNOSIS — R7303 Prediabetes: Secondary | ICD-10-CM | POA: Diagnosis not present

## 2023-06-22 DIAGNOSIS — E538 Deficiency of other specified B group vitamins: Secondary | ICD-10-CM

## 2023-06-22 DIAGNOSIS — Z9889 Other specified postprocedural states: Secondary | ICD-10-CM

## 2023-06-22 DIAGNOSIS — I10 Essential (primary) hypertension: Secondary | ICD-10-CM

## 2023-06-22 DIAGNOSIS — D649 Anemia, unspecified: Secondary | ICD-10-CM | POA: Diagnosis not present

## 2023-06-22 DIAGNOSIS — R0683 Snoring: Secondary | ICD-10-CM

## 2023-06-22 DIAGNOSIS — Z90711 Acquired absence of uterus with remaining cervical stump: Secondary | ICD-10-CM

## 2023-06-22 DIAGNOSIS — R030 Elevated blood-pressure reading, without diagnosis of hypertension: Secondary | ICD-10-CM

## 2023-06-22 DIAGNOSIS — M5431 Sciatica, right side: Secondary | ICD-10-CM

## 2023-06-22 DIAGNOSIS — E782 Mixed hyperlipidemia: Secondary | ICD-10-CM | POA: Diagnosis not present

## 2023-06-22 LAB — CBC WITH DIFFERENTIAL/PLATELET
Basophils Absolute: 0 10*3/uL (ref 0.0–0.1)
Basophils Relative: 0.5 % (ref 0.0–3.0)
Eosinophils Absolute: 0.1 10*3/uL (ref 0.0–0.7)
Eosinophils Relative: 2.1 % (ref 0.0–5.0)
HCT: 39 % (ref 36.0–46.0)
Hemoglobin: 13.2 g/dL (ref 12.0–15.0)
Lymphocytes Relative: 27.1 % (ref 12.0–46.0)
Lymphs Abs: 1.8 10*3/uL (ref 0.7–4.0)
MCHC: 33.8 g/dL (ref 30.0–36.0)
MCV: 85.3 fL (ref 78.0–100.0)
Monocytes Absolute: 0.5 10*3/uL (ref 0.1–1.0)
Monocytes Relative: 7.4 % (ref 3.0–12.0)
Neutro Abs: 4.2 10*3/uL (ref 1.4–7.7)
Neutrophils Relative %: 62.9 % (ref 43.0–77.0)
Platelets: 211 10*3/uL (ref 150.0–400.0)
RBC: 4.57 Mil/uL (ref 3.87–5.11)
RDW: 15.4 % (ref 11.5–15.5)
WBC: 6.7 10*3/uL (ref 4.0–10.5)

## 2023-06-22 LAB — COMPREHENSIVE METABOLIC PANEL
ALT: 28 U/L (ref 0–35)
AST: 22 U/L (ref 0–37)
Albumin: 4.4 g/dL (ref 3.5–5.2)
Alkaline Phosphatase: 49 U/L (ref 39–117)
BUN: 15 mg/dL (ref 6–23)
CO2: 30 meq/L (ref 19–32)
Calcium: 8.5 mg/dL (ref 8.4–10.5)
Chloride: 100 meq/L (ref 96–112)
Creatinine, Ser: 0.72 mg/dL (ref 0.40–1.20)
GFR: 89.58 mL/min (ref >60.00)
Glucose, Bld: 94 mg/dL (ref 70–99)
Potassium: 4.1 meq/L (ref 3.5–5.1)
Sodium: 142 meq/L (ref 135–145)
Total Bilirubin: 0.5 mg/dL (ref 0.2–1.2)
Total Protein: 7.1 g/dL (ref 6.0–8.3)

## 2023-06-22 LAB — LIPID PANEL
Cholesterol: 192 mg/dL (ref 0–200)
HDL: 38.1 mg/dL — ABNORMAL LOW (ref >39.00)
LDL Cholesterol: 102 mg/dL — ABNORMAL HIGH (ref 0–99)
NonHDL: 153.89
Total CHOL/HDL Ratio: 5
Triglycerides: 258 mg/dL — ABNORMAL HIGH (ref 0.0–149.0)
VLDL: 51.6 mg/dL — ABNORMAL HIGH (ref 0.0–40.0)

## 2023-06-22 LAB — B12 AND FOLATE PANEL
Folate: 4.7 ng/mL — ABNORMAL LOW (ref >5.9)
Vitamin B-12: 200 pg/mL — ABNORMAL LOW (ref 211–911)

## 2023-06-22 LAB — LDL CHOLESTEROL, DIRECT: Direct LDL: 118 mg/dL

## 2023-06-22 LAB — HEMOGLOBIN A1C: Hgb A1c MFr Bld: 6.1 % (ref 4.6–6.5)

## 2023-06-22 MED ORDER — CYANOCOBALAMIN 1000 MCG/ML IJ SOLN: 1000.0000 ug | INTRAMUSCULAR | 0 refills | Status: DC

## 2023-06-22 MED ORDER — SYRINGE 25G X 1" 3 ML MISC: 0 refills | Status: DC

## 2023-06-22 MED ORDER — FOLIC ACID 1 MG PO TABS: 1.0000 mg | ORAL_TABLET | Freq: Every day | ORAL | 2 refills | Status: DC

## 2023-06-22 MED ORDER — CELECOXIB 200 MG PO CAPS: 200.0000 mg | ORAL_CAPSULE | Freq: Two times a day (BID) | ORAL | 1 refills | Status: DC

## 2023-06-22 MED ORDER — SIMVASTATIN 20 MG PO TABS: 20.0000 mg | ORAL_TABLET | Freq: Every day | ORAL | 3 refills | Status: DC

## 2023-06-22 NOTE — Patient Instructions (Addendum)
  You can add up to 2000 mg of acetominophen (tylenol) every day safely  In divided doses (500 mg every 6 hours  Or 1000 mg every 12 hours.)  ( Not 3000  because you  have fatty liver)   Try using tramadol without the muscle relaxer,  and  vice versa .  We may need to suspend celexa depending on today's labs   Alternative  is to use  1/2 oxycodone at night,  and use tylenol both doses during the day   Resume the GOLO diet

## 2023-06-22 NOTE — Assessment & Plan Note (Addendum)
Preoperative hgb was 13 in June ; however peroperative hgb at [facility] was 9.9 in November.  Repeat today is normal; however b12 and folate are low and need replacement   Lab Results  Component Value Date   WBC 6.7 06/22/2023   HGB 13.2 06/22/2023   HCT 39.0 06/22/2023   MCV 85.3 06/22/2023   PLT 211.0 06/22/2023   Lab Results  Component Value Date   IRON 86 07/03/2018   TIBC 372 07/03/2018   FERRITIN 89 07/03/2018

## 2023-06-22 NOTE — Assessment & Plan Note (Signed)
Her obesity is aggravating her back pain.  We Reviewed prior successful weight loss trials.  Lost weight with ozempic  but stopped due to insurance non payment ad development of neck mass. Encouraged to resume GOLO diet .  Dietary counselling given

## 2023-06-22 NOTE — Assessment & Plan Note (Signed)
She deferred the sleep study that was ordered and now has an elevated BP reading .  Encouraged to reconsider.

## 2023-06-22 NOTE — Assessment & Plan Note (Addendum)
Hoe readings have been normal

## 2023-06-22 NOTE — Assessment & Plan Note (Signed)
No change by follow up diagnostic mammogram/ultrasound.  Repeat diagnostic bilateral mammogram in June 2025

## 2023-06-22 NOTE — Progress Notes (Signed)
Subjective:  Patient ID: Lisa Valencia, female    DOB: 23-Jul-1960  Age: 62 y.o. MRN: [id number]  CC: The primary encounter diagnosis was Essential hypertension. Diagnoses of Prediabetes, Mixed hyperlipidemia, S/P complete thyroidectomy, S/P abdominal supracervical subtotal hysterectomy, Anemia, unspecified type, Snoring, Sciatica of right side, Elevated systolic blood pressure reading without diagnosis of hypertension, Mass of upper outer quadrant of right breast, Morbid obesity (HCC), and Postoperative anemia were also pertinent to this visit.   HPI MCKINLEIGH BELD presents for  Chief Complaint  Patient presents with   Medical Management of Chronic Issues    6 month follow up    1) back Pain:  she has been using celebrex 200 mg bid  and has added 3000 mg tylenol at the advise of Dr Ernest Pine during recent visit for right knee pain.   She has an active rx for tramadol ad  muscle relaxer (trobaxin) last night,  but the combination made her groggy.  Still has oxycodone from recent thyroid surgery,  did not use more than 3 or 4 tablets after her surgery   2)  postoperative anemia: hgb was 13.3 in June,  9,9 nov 12  no workup done  3) morbid obesity;  stopped ozempic due to discovery of neck mass,  now s/p total thryoidectomy, path report noted  follicular neoplasm right thyroid nodule.  Now on levothyroxine 112 mcg daily , tsh 9 on  nov 20 [facility] managing  4) Elevated BP:  due to pain,  no history of htn    Outpatient Medications Prior to Visit  Medication Sig Dispense Refill   aspirin EC 81 MG tablet Take 81 mg by mouth daily.     cetirizine (ZYRTEC) 10 MG tablet Take 10 mg by mouth daily.     cholecalciferol (VITAMIN D3) 25 MCG (1000 UNIT) tablet Take 1,000 Units by mouth daily.     levothyroxine (SYNTHROID) 112 MCG tablet Take 1 tablet by mouth daily.     methocarbamol (ROBAXIN) 500 MG tablet Take 500 mg by mouth every 6 (six) hours as needed for muscle spasms.     Omega-3 Fatty Acids  (FISH OIL) 1000 MG CAPS Take 1,000 mg by mouth daily.      omeprazole (PRILOSEC) 20 MG capsule TAKE ONE CAPSULE BY MOUTH TWICE DAILY BEFORE A MEAL 180 capsule 3   spironolactone (ALDACTONE) 50 MG tablet TAKE 1 TABLET BY MOUTH DAILY. GENERIC EQUIVALENT FOR ALDACTONE 90 tablet 3   vitamin E 200 UNIT capsule Take 200 Units by mouth daily.     celecoxib (CELEBREX) 200 MG capsule Take 1 capsule (200 mg total) by mouth 2 (two) times daily. 180 capsule 1   simvastatin (ZOCOR) 20 MG tablet Take 1 tablet (20 mg total) by mouth at bedtime. 90 tablet 1   amoxicillin (AMOXIL) 500 MG capsule TAKE 4 CAPSULES ONE HOUR PRIOR TO DENTALPROCEDURE (Patient not taking: Reported on 04/10/2023) 12 capsule 3   No facility-administered medications prior to visit.    Review of Systems;  Patient denies headache, fevers, malaise, unintentional weight loss, skin rash, eye pain, sinus congestion and sinus pain, sore throat, dysphagia,  hemoptysis , cough, dyspnea, wheezing, chest pain, palpitations, orthopnea, edema, abdominal pain, nausea, melena, diarrhea, constipation, flank pain, dysuria, hematuria, urinary  Frequency, nocturia, numbness, tingling, seizures,  Focal weakness, Loss of consciousness,  Tremor, insomnia, depression, anxiety, and suicidal ideation.      Objective:  BP 120/78   Pulse 75   Ht 5\' 10"  (1.778 m)  Wt 299 lb 9.6 oz (135.9 kg)   SpO2 97%   BMI 42.99 kg/m   BP Readings from Last 3 Encounters:  06/22/23 120/78  04/10/23 129/84  01/04/23 124/62    Wt Readings from Last 3 Encounters:  06/22/23 299 lb 9.6 oz (135.9 kg)  04/10/23 292 lb (132.5 kg)  12/19/22 289 lb (131.1 kg)    Physical Exam Vitals reviewed.  Constitutional:      General: She is not in acute distress.    Appearance: Normal appearance. She is obese. She is not ill-appearing, toxic-appearing or diaphoretic.  HENT:     Head: Normocephalic.  Eyes:     General: No scleral icterus.       Right eye: No discharge.         Left eye: No discharge.     Conjunctiva/sclera: Conjunctivae normal.  Cardiovascular:     Rate and Rhythm: Normal rate and regular rhythm.     Heart sounds: Normal heart sounds.  Pulmonary:     Effort: Pulmonary effort is normal. No respiratory distress.     Breath sounds: Normal breath sounds.  Musculoskeletal:        General: Normal range of motion.  Skin:    General: Skin is warm and dry.  Neurological:     General: No focal deficit present.     Mental Status: She is alert and oriented to person, place, and time. Mental status is at baseline.  Psychiatric:        Mood and Affect: Mood normal.        Behavior: Behavior normal.        Thought Content: Thought content normal.        Judgment: Judgment normal.    Lab Results  Component Value Date   HGBA1C 6.1 06/22/2023   HGBA1C 6.0 12/19/2022   HGBA1C 6.2 06/02/2022    Lab Results  Component Value Date   CREATININE 0.72 06/22/2023   CREATININE 0.62 12/19/2022   CREATININE 0.66 06/02/2022    Lab Results  Component Value Date   WBC 6.7 06/22/2023   HGB 13.2 06/22/2023   HCT 39.0 06/22/2023   PLT 211.0 06/22/2023   GLUCOSE 94 06/22/2023   CHOL 192 06/22/2023   TRIG 258.0 (H) 06/22/2023   HDL 38.10 (L) 06/22/2023   LDLDIRECT 118.0 06/22/2023   LDLCALC 102 (H) 06/22/2023   ALT 28 06/22/2023   AST 22 06/22/2023   NA 142 06/22/2023   K 4.1 06/22/2023   CL 100 06/22/2023   CREATININE 0.72 06/22/2023   BUN 15 06/22/2023   CO2 30 06/22/2023   TSH 0.73 12/19/2022   INR 0.90 04/19/2018   HGBA1C 6.1 06/22/2023   MICROALBUR <0.7 12/19/2022    CT SOFT TISSUE NECK W CONTRAST  Result Date: 03/14/2023 CLINICAL DATA:  62 year old female with left neck mass. History of enlarged and multinodular thyroid with thyroid nodules and status post ultrasound-guided bilateral thyroid lobe FNA on 01/04/2023. EXAM: CT NECK WITH CONTRAST TECHNIQUE: Multidetector CT imaging of the neck was performed using the standard protocol following  the bolus administration of intravenous contrast. RADIATION DOSE REDUCTION: This exam was performed according to the departmental dose-optimization program which includes automated exposure control, adjustment of the mA and/or kV according to patient size and/or use of iterative reconstruction technique. CONTRAST:  75mL ISOVUE-300 IOPAMIDOL (ISOVUE-300) INJECTION 61% COMPARISON:  Thyroid ultrasounds in June this year. FINDINGS: Pharynx and larynx: Rightward deviation of the trachea and larynx extending to the thoracic inlet is related  to asymmetric thyroid enlargement greater on the left, see details below. The glottis is closed. No discrete laryngeal mass or hyperenhancement. Pharyngeal soft tissue contours are within normal limits and are more midline. The superior parapharyngeal and retropharyngeal spaces are normal. Salivary glands: Negative.  Negative sublingual space. Thyroid: Heterogeneously enlarged thyroid. The left lobe encompasses 50 x 58 by 105 mm (AP by transverse by CC) for an estimated left low volume of 152 mL. Smaller but heterogeneous contralateral right thyroid lobe is approximately 35 x 22 by 58 mm (AP by transverse by CC) for an estimated right lobe volume of 22 mL. No adjacent soft tissue plane is evident. Regional mass effect including rightward shift of the larynx and subglottic trachea. There is mild narrowing of the trachea transverse diameter (such as series 3, image 109) at the thoracic inlet, but overall no significant airway stenosis. The thyroid has been evaluated on previous imaging. (ref: J Am Coll Radiol. 2015 Feb;12(2): 143-50). Lymph nodes: Marked area of clinical concern left lateral neck on series 3, image 64 and coronal image 54 corresponds to the cephalad most extent of left thyroid enlargement as seen on the coronal. But directly underlying the skin marker there is no discrete neck mass or lymphadenopathy. Bilateral cervical lymph nodes are subcentimeter and within normal  limits. No heterogeneous or partially calcified nodes. Vascular: Suboptimal vascular contrast bolus but the major vascular structures in the neck and at the skull base appear to remain patent. There is lateral displacement of both carotid spaces in the lower neck secondary to the thyroid enlargement. No carotid space invasion is evident. Limited intracranial: Negative. Visualized orbits: Negative. Mastoids and visualized paranasal sinuses: Well aerated throughout. Minor maxillary alveolar recess mucosal thickening. Tympanic cavities and mastoids are clear. Skeleton: Maxillary molar periapical lucency. Bilateral TMJ degeneration. Ordinary cervical spine degeneration. No acute or suspicious osseous lesion. Upper chest: The trachea and carina remain patent. Upper lungs are clear. Retrosternal extension of the left thyroid lobe into the anterior superior mediastinum on series 3, image 122. But otherwise negative visible mediastinum. Normal visible axillary lymph nodes. IMPRESSION: 1. Heterogeneously Enlarged Thyroid (evaluated on previous imaging- ref: J Am Coll Radiol. 2015 Feb;12(2): 143-50), with markedly enlarged left lobe; that lobe alone is estimated at 152 mL. Mild extension of the left lobe into the anterior superior mediastinum, and the cephalad margin of the enlarged left thyroid lobe extends to the level of marked clinical concern. 2. No other neck mass. No lymphadenopathy or other abnormality in the marked area of clinical concern. Electronically Signed   By: Odessa Fleming M.D.   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 Patient is now s/p total thyroidectomy on 05/29/23 done by [facility]    Anemia, unspecified type -     CBC with Differential/Platelet -  Iron, TIBC and Ferritin Panel -     B12 and Folate Panel -     Fecal occult blood, imunochemical; Future  Snoring Assessment & Plan: She deferred the sleep study that was ordered and now has an elevated BP reading .  Encouraged to reconsider.    Sciatica of right side Assessment & Plan: No longer managed with oxycodone.  Using celebrex, tylenol and MR's without relief.  Advised to add tramadol without muscle relaxer as a trial    Elevated systolic blood pressure reading without diagnosis of hypertension Assessment & Plan: Hoe readings have been normal     Mass of upper outer quadrant of right breast Assessment & Plan: No change by follow up diagnostic mammogram/ultrasound.  Repeat diagnostic bilateral mammogram in June 2025   Morbid obesity [facility]) Assessment & Plan: Her obesity is aggravating her back pain.  We Reviewed prior successful weight loss trials.  Lost weight with ozempic  but stopped due to insurance non payment ad development of neck mass. Encouraged to resume GOLO diet .  Dietary counselling given    Postoperative anemia Assessment & Plan: Preoperative hgb was 13 in June ; however peroperative hgb at [facility] was 9.9 in November.  Repeat today is normal  Lab Results  Component Value Date   WBC 6.7 06/22/2023   HGB 13.2 06/22/2023   HCT 39.0 06/22/2023   MCV 85.3 06/22/2023   PLT 211.0 06/22/2023   Lab Results  Component Value Date   IRON 86 07/03/2018   TIBC 372 07/03/2018   FERRITIN 89 07/03/2018       Other orders -     Celecoxib; Take 1 capsule (200 mg total) by mouth 2  (two) times daily.  Dispense: 180 capsule; Refill: 1 -     Simvastatin; Take 1 tablet (20 mg total) by mouth at bedtime.  Dispense: 90 tablet; Refill: 3     I provided 49 minutes of face-to-face time during this encounter reviewing patient's  recent [facility] hospitalization, recent surgical procedure, previous  labs and imaging studies, counseling on currently addressed issues,  and post visit ordering to diagnostics and therapeutics .   Follow-up: Return in about 4 weeks (around 07/20/2023) for chronic pain management.   Sherlene Shams, MD

## 2023-06-22 NOTE — Assessment & Plan Note (Signed)
No longer managed with oxycodone.  Using celebrex, tylenol and MR's without relief.  Advised to add tramadol without muscle relaxer as a trial

## 2023-06-22 NOTE — Assessment & Plan Note (Addendum)
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 Patient is now s/p total thyroidectomy on 05/29/23 done by [facility]NT

## 2023-06-24 LAB — IRON,TIBC AND FERRITIN PANEL
%SAT: 17 % (ref 16–45)
Ferritin: 34 ng/mL (ref 16–288)
Iron: 67 ug/dL (ref 45–160)
TIBC: 392 ug/dL (ref 250–450)

## 2023-06-26 ENCOUNTER — Ambulatory Visit
Admission: RE | Admit: 2023-06-26 | Discharge: 2023-06-26 | Disposition: A | Discharge status: Home / Self Care | Payer: Medicare Other | Source: Ambulatory Visit | Attending: Otolaryngology | Admitting: Otolaryngology

## 2023-06-26 DIAGNOSIS — R1310 Dysphagia, unspecified: Secondary | ICD-10-CM | POA: Diagnosis not present

## 2023-06-26 DIAGNOSIS — R059 Cough, unspecified: Secondary | ICD-10-CM | POA: Diagnosis not present

## 2023-06-26 NOTE — Therapy (Deleted)
Modified Barium Swallow Study  Patient Details  Name: Lisa Valencia MRN: [id number] Date of Birth: 1961-02-01  Today's Date: 06/26/2023  HPI/PMH: HPI: Pt is a 62 y.o. female who present for MBSS. Pt s/p total thyroidectomy on 05/29/23 at [facility]. Pt with c/o increased cough and "choking on saliva" and liquids since sx. Pt with hx of GERD, and anxiety, esophagitis.   Clinical Impression: Clinical Impression: Pt demonstrated an intact oropharyngeal swallow. Age-related swallowing changes appreciated. Esophageal screening was also unremarkable.  Factors that may increase risk of adverse event in presence of aspiration Rubye Oaks & Clearance Coots 2021): No data recorded  Recommendations/Plan: Swallowing Evaluation Recommendations Swallowing Evaluation Recommendations Recommendations: PO diet PO Diet Recommendation: Regular; Thin liquids (Level 0) Liquid Administration via: Spoon; Cup; Straw Medication Administration: -- (as tolerated) Supervision: Patient able to self-feed Postural changes: Position pt fully upright for meals (upright 60-90 minutes after meals; reflux precautions) Oral care recommendations: Oral care BID (2x/day); Pt independent with oral care    Treatment Plan Treatment Plan Treatment recommendations: No treatment recommended at this time Follow-up recommendations: No SLP follow up     Recommendations Recommendations for follow up therapy are one component of a multi-disciplinary discharge planning process, led by the attending physician.  Recommendations may be updated based on patient status, additional functional criteria and insurance authorization.  Assessment: Orofacial Exam: Orofacial Exam Oral Cavity: Oral Hygiene: WFL Oral Cavity - Dentition: Adequate natural dentition Orofacial Anatomy: WFL Oral Motor/Sensory Function: WFL    Anatomy:  Anatomy: WFL   Boluses Administered: Boluses Administered Boluses Administered: Thin liquids (Level 0); Mildly thick  liquids (Level 2, nectar thick); Moderately thick liquids (Level 3, honey thick); Puree; Solid     Oral Impairment Domain: Oral Impairment Domain Lip Closure: No labial escape Tongue control during bolus hold: Cohesive bolus between tongue to palatal seal Bolus preparation/mastication: Timely and efficient chewing and mashing Bolus transport/lingual motion: Brisk tongue motion Oral residue: Complete oral clearance Location of oral residue : N/A Initiation of pharyngeal swallow : Pyriform sinuses     Pharyngeal Impairment Domain: Pharyngeal Impairment Domain Soft palate elevation: No bolus between soft palate (SP)/pharyngeal wall (PW) Laryngeal elevation: Complete superior movement of thyroid cartilage with complete approximation of arytenoids to epiglottic petiole Anterior hyoid excursion: Complete anterior movement Epiglottic movement: Complete inversion Laryngeal vestibule closure: Complete, no air/contrast in laryngeal vestibule Pharyngeal stripping wave : Present - complete Pharyngeal contraction (A/P view only): N/A Pharyngoesophageal segment opening: Complete distension and complete duration, no obstruction of flow Tongue base retraction: No contrast between tongue base and posterior pharyngeal wall (PPW) Pharyngeal residue: Complete pharyngeal clearance Location of pharyngeal residue: N/A     Esophageal Impairment Domain: Esophageal Impairment Domain Esophageal clearance upright position: Complete clearance, esophageal coating    Pill: No data recorded  Penetration/Aspiration Scale Score: Penetration/Aspiration Scale Score 1.  Material does not enter airway: Thin liquids (Level 0); Mildly thick liquids (Level 2, nectar thick); Moderately thick liquids (Level 3, honey thick); Puree; Solid    Compensatory Strategies: No data recorded     General Information: No data recorded  Diet Prior to this Study: Regular; Thin liquids (Level 0)    No data recorded    No data recorded   Supplemental O2: None (Room air)    No data recorded  No data recorded No data recorded Baseline vocal quality/speech: Dysphonic  Volitional Cough: Able to elicit  Volitional Swallow: Able to elicit  No data recorded  Goal Planning: Prognosis for improved oropharyngeal function: Good  No data recorded No data recorded No data recorded No data recorded  Pain: No data recorded  End of Session: Start Time:No data recorded Stop Time: No data recorded Time Calculation:No data recorded Charges: SLP Evaluations $ SLP Speech Visit: 1 Visit  SLP Evaluations $Outpatient MBS Swallow: 1 Procedure   SLP visit diagnosis: SLP Visit Diagnosis: Dysphagia, unspecified (R13.10)    Past Medical History:  Past Medical History:  Diagnosis Date   Anxiety    Arthritis    Depression    Diverticulosis of intestine 2010   iftikhar   Esophagitis, reflux 2010    EGD by iftkihar, managed wiht dexilant   GERD (gastroesophageal reflux disease)    Impaired fasting glucose 01/25/2012   Needs eye exam.     Past Surgical History:  Past Surgical History:  Procedure Laterality Date   ABDOMINAL HYSTERECTOMY  2009   supracervical    APPENDECTOMY     BREAST BIOPSY Right 01/26/2022   papilloma   BREAST CYST ASPIRATION Right    rt fna-neg   COLONOSCOPY WITH PROPOFOL N/A 06/05/2015   Procedure: COLONOSCOPY WITH PROPOFOL;  Surgeon: Midge Minium, MD;  Location: [facility];  Service: Endoscopy;  Laterality: N/A;  Pt requests early. Pt for antibiotic pre-procedure  CECAL PROTRUSION BX. / WITH CLIP LOT # [id number] RESOLUTION 360 EXP 03/03/2016 SIGMOID POLYP   JOINT REPLACEMENT Right 2012   hip, [name]   TOTAL HIP ARTHROPLASTY Left 05/02/2018   Procedure: TOTAL HIP ARTHROPLASTY;  Surgeon: Donato Heinz, MD;  Location: [facility] OR[facility];  Service: Orthopedics;  Laterality: Left;    Woodroe Chen 06/26/2023, 3:04 PM

## 2023-06-26 NOTE — Therapy (Signed)
Modified Barium Swallow Study  Patient Details  Name: Lisa Valencia MRN: [id number] Date of Birth: 04/02/1961  Today's Date: 06/26/2023  Modified Barium Swallow completed.  Full report located under Chart Review in the Imaging Section.  History of Present Illness Pt is a 62 y.o. female who present for MBSS. Pt s/p total thyroidectomy on 05/29/23 at [facility]. Pt with c/o increased cough and "choking on saliva" and liquids since sx. Pt with hx of GERD, and anxiety, esophagitis.   Clinical Impression Pt demonstrated an intact oropharyngeal swallow. Age-related swallowing changes appreciated. Esophageal screening was also unremarkable. Factors that may increase risk of adverse event in presence of aspiration Lisa Valencia & Lisa Valencia 2021):  N/A  Swallow Evaluation Recommendations Recommendations: PO diet PO Diet Recommendation: Regular;Thin liquids (Level 0) Liquid Administration via: Spoon;Cup;Straw Medication Administration:  (as tolerated) Supervision: Patient able to self-feed Postural changes: Position pt fully upright for meals (upright 60-90 minutes after meals; reflux precautions) Oral care recommendations: Oral care BID (2x/day);Pt independent with oral care     Lisa Valencia, M.S., CCC-SLP Speech-Language Pathologist [facility] [phone] (ASCOM)  Lisa Valencia 06/26/2023,2:54 PM

## 2023-07-03 DIAGNOSIS — K080 Exfoliation of teeth due to systemic causes: Secondary | ICD-10-CM | POA: Diagnosis not present

## 2023-07-25 ENCOUNTER — Ambulatory Visit (INDEPENDENT_AMBULATORY_CARE_PROVIDER_SITE_OTHER): Payer: Medicare Other | Admitting: Internal Medicine

## 2023-07-25 VITALS — BP 140/86 | HR 94 | Ht 70.0 in | Wt 297.8 lb

## 2023-07-25 DIAGNOSIS — E538 Deficiency of other specified B group vitamins: Secondary | ICD-10-CM | POA: Diagnosis not present

## 2023-07-25 DIAGNOSIS — R030 Elevated blood-pressure reading, without diagnosis of hypertension: Secondary | ICD-10-CM | POA: Diagnosis not present

## 2023-07-25 DIAGNOSIS — E890 Postprocedural hypothyroidism: Secondary | ICD-10-CM | POA: Diagnosis not present

## 2023-07-25 DIAGNOSIS — R0683 Snoring: Secondary | ICD-10-CM

## 2023-07-25 DIAGNOSIS — E782 Mixed hyperlipidemia: Secondary | ICD-10-CM

## 2023-07-25 DIAGNOSIS — E039 Hypothyroidism, unspecified: Secondary | ICD-10-CM

## 2023-07-25 DIAGNOSIS — R609 Edema, unspecified: Secondary | ICD-10-CM

## 2023-07-25 MED ORDER — CYANOCOBALAMIN 1000 MCG/ML IJ SOLN
1000.0000 ug | INTRAMUSCULAR | 0 refills | Status: DC
Start: 2023-07-25 — End: 2023-10-06

## 2023-07-25 MED ORDER — LEVOTHYROXINE SODIUM 112 MCG PO TABS: 112.0000 ug | ORAL_TABLET | Freq: Every day | ORAL | 3 refills | Status: DC

## 2023-07-25 MED ORDER — SYRINGE 25G X 1" 3 ML MISC: 0 refills | Status: DC

## 2023-07-25 NOTE — Assessment & Plan Note (Signed)
 WITH SNORING ,  LOWER EXTREMITY EDEMA AND OBESITY

## 2023-07-25 NOTE — Assessment & Plan Note (Signed)
 She continue to def  he sletep study that was ordered and continues to have an  elevated BP reading.  SEncouraged to reconsider.

## 2023-07-25 NOTE — Patient Instructions (Signed)
 Continue 112 mcg levothyroxine  until I tell you if the dose needs changing based on today's TSH  Please reconsider sleep study prior to next general anesthesia    Blood pressure needs to be treated;  I will choose once I evaluate your urine for proteinuria   B12 now is  NEEDED MONTHLY  SENT TO MAIL ORDER    EVERYTHING WILL GO TO MAIL ORDER IF YOU LIKE

## 2023-07-25 NOTE — Assessment & Plan Note (Signed)
 Secondary to large goiter causing compression of trachea.   Benign pathology.  Symptoms improving

## 2023-07-25 NOTE — Assessment & Plan Note (Signed)
 Statin therapy was advised and started in 2017.  She had stopped the medication  due to elevated liver enzymes ,  but has resumed since LFTs have normalized .   Lab Results  Component Value Date   CHOL 192 06/22/2023   HDL 38.10 (L) 06/22/2023   LDLCALC 102 (H) 06/22/2023   LDLDIRECT 118.0 06/22/2023   TRIG 258.0 (H) 06/22/2023   CHOLHDL 5 06/22/2023   Lab Results  Component Value Date   ALT 28 06/22/2023   AST 22 06/22/2023   ALKPHOS 49 06/22/2023   BILITOT 0.5 06/22/2023

## 2023-07-25 NOTE — Progress Notes (Signed)
 Subjective:  Patient ID: Lisa Valencia, female    DOB: Jan 12, 1961  Age: 63 y.o. MRN: [id number]  CC: The primary encounter diagnosis was B12 deficiency. Diagnoses of Folic acid  deficiency, Snoring, Elevated blood pressure reading, S/P complete thyroidectomy, Edema, unspecified type, Elevated systolic blood pressure reading without diagnosis of hypertension, and Mixed hyperlipidemia were also pertinent to this visit.   HPI Lisa Valencia presents for  Chief Complaint  Patient presents with   Medical Management of Chronic Issues    1 month follow up on chronic pain    1) chronic back pain :  currently resolved   2) s/p thyroidectomy in , November for large gotier,  path benign on all adenomas.  Has been taking 112 mcg levothyroxine   since Nov 5.    3) b12/folate deficiency out of b12 injections. plete  4) chronic knee pain  Seeing [facility] for knee replacement   5) elevated BP last 2 visits  tking celebrex  for OA , bilateral hip pain,  knee pain.    Outpatient Medications Prior to Visit  Medication Sig Dispense Refill   aspirin  EC 81 MG tablet Take 81 mg by mouth daily.     celecoxib  (CELEBREX ) 200 MG capsule Take 1 capsule (200 mg total) by mouth 2 (two) times daily. 180 capsule 1   cetirizine (ZYRTEC) 10 MG tablet Take 10 mg by mouth daily.     cholecalciferol (VITAMIN D3) 25 MCG (1000 UNIT) tablet Take 1,000 Units by mouth daily.     folic acid  (FOLVITE ) 1 MG tablet Take 1 tablet (1 mg total) by mouth daily. 90 tablet 2   methocarbamol  (ROBAXIN ) 500 MG tablet Take 500 mg by mouth every 6 (six) hours as needed for muscle spasms.     Omega-3 Fatty Acids (FISH OIL) 1000 MG CAPS Take 1,000 mg by mouth daily.      omeprazole  (PRILOSEC) 20 MG capsule TAKE ONE CAPSULE BY MOUTH TWICE DAILY BEFORE A MEAL 180 capsule 3   simvastatin  (ZOCOR ) 20 MG tablet Take 1 tablet (20 mg total) by mouth at bedtime. 90 tablet 3   spironolactone  (ALDACTONE ) 50 MG tablet TAKE 1 TABLET BY MOUTH  DAILY. GENERIC EQUIVALENT FOR ALDACTONE  90 tablet 3   vitamin E 200 UNIT capsule Take 200 Units by mouth daily.     cyanocobalamin  (VITAMIN B12) 1000 MCG/ML injection Inject 1 mL (1,000 mcg total) into the muscle once a week. 4 mL 0   levothyroxine  (SYNTHROID ) 112 MCG tablet Take 1 tablet by mouth daily.     Syringe/Needle, Disp, (SYRINGE 3CC/25GX1) 25G X 1 3 ML MISC Use for b12 injections 50 each 0   amoxicillin  (AMOXIL ) 500 MG capsule TAKE 4 CAPSULES ONE HOUR PRIOR TO DENTALPROCEDURE (Patient not taking: Reported on 07/25/2023) 12 capsule 3   No facility-administered medications prior to visit.    Review of Systems;  Patient denies headache, fevers, malaise, unintentional weight loss, skin rash, eye pain, sinus congestion and sinus pain, sore throat, dysphagia,  hemoptysis , cough, dyspnea, wheezing, chest pain, palpitations, orthopnea, edema, abdominal pain, nausea, melena, diarrhea, constipation, flank pain, dysuria, hematuria, urinary  Frequency, nocturia, numbness, tingling, seizures,  Focal weakness, Loss of consciousness,  Tremor, insomnia, depression, anxiety, and suicidal ideation.      Objective:  BP (!) 140/86   Pulse 94   Ht 5' 10 (1.778 m)   Wt 297 lb 12.8 oz (135.1 kg)   SpO2 95%   BMI 42.73 kg/m   BP Readings  from Last 3 Encounters:  07/25/23 (!) 140/86  06/22/23 120/78  04/10/23 129/84    Wt Readings from Last 3 Encounters:  07/25/23 297 lb 12.8 oz (135.1 kg)  06/22/23 299 lb 9.6 oz (135.9 kg)  04/10/23 292 lb (132.5 kg)    Physical Exam Vitals reviewed.  Constitutional:      General: She is not in acute distress.    Appearance: Normal appearance. She is normal weight. She is not ill-appearing, toxic-appearing or diaphoretic.  HENT:     Head: Normocephalic.  Eyes:     General: No scleral icterus.       Right eye: No discharge.        Left eye: No discharge.     Conjunctiva/sclera: Conjunctivae normal.  Cardiovascular:     Rate and Rhythm: Normal  rate and regular rhythm.     Heart sounds: Normal heart sounds.  Pulmonary:     Effort: Pulmonary effort is normal. No respiratory distress.     Breath sounds: Normal breath sounds.  Musculoskeletal:        General: Normal range of motion.  Skin:    General: Skin is warm and dry.  Neurological:     General: No focal deficit present.     Mental Status: She is alert and oriented to person, place, and time. Mental status is at baseline.  Psychiatric:        Mood and Affect: Mood normal.        Behavior: Behavior normal.        Thought Content: Thought content normal.        Judgment: Judgment normal.    Lab Results  Component Value Date   HGBA1C 6.1 06/22/2023   HGBA1C 6.0 12/19/2022   HGBA1C 6.2 06/02/2022    Lab Results  Component Value Date   CREATININE 0.72 06/22/2023   CREATININE 0.62 12/19/2022   CREATININE 0.66 06/02/2022    Lab Results  Component Value Date   WBC 6.7 06/22/2023   HGB 13.2 06/22/2023   HCT 39.0 06/22/2023   PLT 211.0 06/22/2023   GLUCOSE 94 06/22/2023   CHOL 192 06/22/2023   TRIG 258.0 (H) 06/22/2023   HDL 38.10 (L) 06/22/2023   LDLDIRECT 118.0 06/22/2023   LDLCALC 102 (H) 06/22/2023   ALT 28 06/22/2023   AST 22 06/22/2023   NA 142 06/22/2023   K 4.1 06/22/2023   CL 100 06/22/2023   CREATININE 0.72 06/22/2023   BUN 15 06/22/2023   CO2 30 06/22/2023   TSH 0.73 12/19/2022   INR 0.90 04/19/2018   HGBA1C 6.1 06/22/2023   MICROALBUR <0.7 12/19/2022    DG SWALLOW FUNC OP MEDICARE SPEECH PATH Result Date: 06/26/2023 Table formatting from the original result was not included. CLINICAL DATA:  Provided history: Dysphagia, unspecified type. Additional history provided: prior thyroidectomy with post-operative coughing/aspiration. EXAM: MODIFIED BARIUM SWALLOW TECHNIQUE: Different consistencies of barium were administered orally to the patient by the speech pathologist. Imaging of the pharynx was acquired in the lateral projection. [name],  NP was present in the fluoroscopy room during this study, which was supervised and reviewed by Dr. Rockey Childs. Cine fluoroscopy/video radiography was acquired for this exam. FLUOROSCOPY: Radiation Exposure Index (as provided by the fluoroscopic device): 14.20 mGy Kerma COMPARISON:  Neck CT 03/01/2023. FINDINGS: Different consistencies of barium were administered orally to the patient by the speech pathologist with fluoroscopic imaging of the pharynx acquired in lateral projection. Limited fluoroscopic imaging of the esophagus was also acquired. The speech  pathologist did not observe aspiration. Please refer to the speech pathologist's report for complete details and recommendations. IMPRESSION: 1. Modified barium swallow as described. 2. No aspiration was observed. 3. Please refer to the speech pathologist's report for complete details and recommendations. Electronically Signed   By: Rockey Childs D.O.   On: 06/26/2023 14:27 Modified Barium Swallow Study Patient Details Name: ZULAY CORRIE MRN: [id number] Date of Birth: 1960-12-02 Today's Date: 06/26/2023 HPI/PMH: HPI: Pt is a 63 y.o. female who present for MBSS. Pt s/p total thyroidectomy on 05/29/23 at [facility]. Pt with c/o increased cough and choking on saliva and liquids since sx. Pt with hx of GERD, and anxiety, esophagitis. Clinical Impression: Clinical Impression: Pt demonstrated an intact oropharyngeal swallow. Age-related swallowing changes appreciated. Esophageal screening was also unremarkable. Factors that may increase risk of adverse event in presence of aspiration Noe & Lianne 2021): No data recorded Recommendations/Plan: Swallowing Evaluation Recommendations Swallowing Evaluation Recommendations Recommendations: PO diet PO Diet Recommendation: Regular; Thin liquids (Level 0) Liquid Administration via: Spoon; Cup; Straw Medication Administration: -- (as tolerated) Supervision: Patient able to self-feed Postural changes: Position pt fully upright for  meals (upright 60-90 minutes after meals; reflux precautions) Oral care recommendations: Oral care BID (2x/day); Pt independent with oral care Treatment Plan Treatment Plan Treatment recommendations: No treatment recommended at this time Follow-up recommendations: No SLP follow up Recommendations Recommendations for follow up therapy are one component of a multi-disciplinary discharge planning process, led by the attending physician.  Recommendations may be updated based on patient status, additional functional criteria and insurance authorization. Assessment: Orofacial Exam: Orofacial Exam Oral Cavity: Oral Hygiene: WFL Oral Cavity - Dentition: Adequate natural dentition Orofacial Anatomy: WFL Oral Motor/Sensory Function: WFL Anatomy: Anatomy: WFL Boluses Administered: Boluses Administered Boluses Administered: Thin liquids (Level 0); Mildly thick liquids (Level 2, nectar thick); Moderately thick liquids (Level 3, honey thick); Puree; Solid  Oral Impairment Domain: Oral Impairment Domain Lip Closure: No labial escape Tongue control during bolus hold: Cohesive bolus between tongue to palatal seal Bolus preparation/mastication: Timely and efficient chewing and mashing Bolus transport/lingual motion: Brisk tongue motion Oral residue: Complete oral clearance Location of oral residue : N/A Initiation of pharyngeal swallow : Pyriform sinuses  Pharyngeal Impairment Domain: Pharyngeal Impairment Domain Soft palate elevation: No bolus between soft palate (SP)/pharyngeal wall (PW) Laryngeal elevation: Complete superior movement of thyroid  cartilage with complete approximation of arytenoids to epiglottic petiole Anterior hyoid excursion: Complete anterior movement Epiglottic movement: Complete inversion Laryngeal vestibule closure: Complete, no air/contrast in laryngeal vestibule Pharyngeal stripping wave : Present - complete Pharyngeal contraction (A/P view only): N/A Pharyngoesophageal segment opening: Complete distension  and complete duration, no obstruction of flow Tongue base retraction: No contrast between tongue base and posterior pharyngeal wall (PPW) Pharyngeal residue: Complete pharyngeal clearance Location of pharyngeal residue: N/A  Esophageal Impairment Domain: Esophageal Impairment Domain Esophageal clearance upright position: Complete clearance, esophageal coating Pill: No data recorded Penetration/Aspiration Scale Score: Penetration/Aspiration Scale Score 1.  Material does not enter airway: Thin liquids (Level 0); Mildly thick liquids (Level 2, nectar thick); Moderately thick liquids (Level 3, honey thick); Puree; Solid Compensatory Strategies: No data recorded  General Information: No data recorded Diet Prior to this Study: Regular; Thin liquids (Level 0)   No data recorded  No data recorded  Supplemental O2: None (Room air)   No data recorded No data recorded No data recorded Baseline vocal quality/speech: Dysphonic Volitional Cough: Able to elicit Volitional Swallow: Able to elicit No data recorded Goal Planning: Prognosis for  improved oropharyngeal function: Good No data recorded No data recorded No data recorded No data recorded Pain: No data recorded End of Session: Start Time:No data recorded Stop Time: No data recorded Time Calculation:No data recorded Charges: SLP Evaluations $ SLP Speech Visit: 1 Visit SLP Evaluations $Outpatient MBS Swallow: 1 Procedure SLP visit diagnosis: SLP Visit Diagnosis: Dysphagia, unspecified (R13.10) Past Medical History: Past Medical History: Diagnosis Date  Anxiety   Arthritis   Depression   Diverticulosis of intestine 2010  iftikhar  Esophagitis, reflux 2010   EGD by iftkihar, managed wiht dexilant   GERD (gastroesophageal reflux disease)   Impaired fasting glucose 01/25/2012  Needs eye exam.   Past Surgical History: Past Surgical History: Procedure Laterality Date  ABDOMINAL HYSTERECTOMY  2009  supracervical   APPENDECTOMY    BREAST BIOPSY Right 01/26/2022  papilloma  BREAST CYST  ASPIRATION Right   rt fna-neg  COLONOSCOPY WITH PROPOFOL  N/A 06/05/2015  Procedure: COLONOSCOPY WITH PROPOFOL ;  Surgeon: Rogelia Copping, MD;  Location: [facility];  Service: Endoscopy;  Laterality: N/A;  Pt requests early. Pt for antibiotic pre-procedure CECAL PROTRUSION BX. / WITH CLIP LOT # [id number] RESOLUTION 360 EXP 03/03/2016 SIGMOID POLYP  JOINT REPLACEMENT Right 2012  hip, [name]  TOTAL HIP ARTHROPLASTY Left 05/02/2018  Procedure: TOTAL HIP ARTHROPLASTY;  Surgeon: Mardee Lynwood SQUIBB, MD;  Location: [facility];  Service: Orthopedics;  Laterality: Left; Delon Bangs, M.S., CCC-SLP Speech-Language Pathologist [facility] [phone] [place]) Delon CHRISTELLA Bangs 06/26/2023, 4:18 PM   Assessment & Plan:  .B12 deficiency -     B12 and Folate Panel -     Intrinsic Factor Antibodies -     Cyanocobalamin ; Inject 1 mL (1,000 mcg total) into the muscle every 30 (thirty) days.  Dispense: 3 mL; Refill: 0  Folic acid  deficiency  Snoring Assessment & Plan: She continue to def  he sletep study that was ordered and continues to have an  elevated BP reading.  SEncouraged to reconsider.    Elevated blood pressure reading -     Microalbumin / creatinine urine ratio -     Basic metabolic panel  S/P complete thyroidectomy Assessment & Plan: Secondary to large goiter causing compression of trachea.   Benign pathology.  Symptoms improving  Orders: -     TSH  Edema, unspecified type Assessment & Plan: USING SPIRONOLACTONE  EVERY OTHER DAY FOR ESTROGEN RELATED BREAST SWELLING    Elevated systolic blood pressure reading without diagnosis of hypertension Assessment & Plan: WITH SNORING ,  LOWER EXTREMITY EDEMA AND OBESITY   Mixed hyperlipidemia Assessment & Plan: Statin therapy was advised and started in 2017.  She had stopped the medication  due to elevated liver enzymes ,  but has resumed since LFTs have normalized .   Lab Results  Component Value Date    CHOL 192 06/22/2023   HDL 38.10 (L) 06/22/2023   LDLCALC 102 (H) 06/22/2023   LDLDIRECT 118.0 06/22/2023   TRIG 258.0 (H) 06/22/2023   CHOLHDL 5 06/22/2023   Lab Results  Component Value Date   ALT 28 06/22/2023   AST 22 06/22/2023   ALKPHOS 49 06/22/2023   BILITOT 0.5 06/22/2023      Other orders -     Levothyroxine  Sodium; Take 1 tablet (112 mcg total) by mouth daily.  Dispense: 90 tablet; Refill: 3 -     Syringe; Use for b12 injections  Dispense: 50 each; Refill: 0     I provided 30 minutes of face-to-face  time during this encounter reviewing patient's last visit with me, patient's  most recent visit with surgery, orthopedics, recent surgical and non surgical procedures, previous  labs and imaging studies, counseling on currently addressed issues,  and post visit ordering to diagnostics and therapeutics .   Follow-up: Return in about 6 months (around 01/22/2024) for hypertension.   Verneita LITTIE Kettering, MD

## 2023-07-25 NOTE — Assessment & Plan Note (Signed)
 USING SPIRONOLACTONE EVERY OTHER DAY FOR ESTROGEN RELATED BREAST SWELLING

## 2023-07-26 LAB — BASIC METABOLIC PANEL
BUN: 13 mg/dL (ref 6–23)
CO2: 35 meq/L — ABNORMAL HIGH (ref 19–32)
Calcium: 9.4 mg/dL (ref 8.4–10.5)
Chloride: 98 meq/L (ref 96–112)
Creatinine, Ser: 0.73 mg/dL (ref 0.40–1.20)
GFR: 88.06 mL/min (ref >60.00)
Glucose, Bld: 88 mg/dL (ref 70–99)
Potassium: 3.9 meq/L (ref 3.5–5.1)
Sodium: 142 meq/L (ref 135–145)

## 2023-07-26 LAB — B12 AND FOLATE PANEL
Folate: 6 ng/mL (ref >5.9)
Vitamin B-12: 507 pg/mL (ref 211–911)

## 2023-07-26 LAB — TSH: TSH: 5.59 u[IU]/mL — ABNORMAL HIGH (ref 0.35–5.50)

## 2023-07-26 LAB — MICROALBUMIN / CREATININE URINE RATIO
Creatinine,U: 80 mg/dL
Microalb Creat Ratio: 3.2 mg/g (ref 0.0–30.0)
Microalb, Ur: 2.6 mg/dL — ABNORMAL HIGH (ref 0.0–1.9)

## 2023-07-27 ENCOUNTER — Encounter: Payer: Self-pay | Admitting: Internal Medicine

## 2023-07-28 ENCOUNTER — Encounter: Payer: Self-pay | Admitting: Internal Medicine

## 2023-07-28 DIAGNOSIS — E039 Hypothyroidism, unspecified: Secondary | ICD-10-CM | POA: Insufficient documentation

## 2023-07-28 NOTE — Addendum Note (Signed)
 Addended by: Sherlene Shams on: 07/28/2023 10:27 AM   Modules accepted: Orders

## 2023-07-28 NOTE — Assessment & Plan Note (Signed)
 ADVISED TO INCREASE LEVOTHYROXINE  112 MCG TO 2 TABLETS ON SUNDAYS, 1 TABLET ALL OTHER DAYS     REPEAT TSH IN 6 WEEKS  Lab Results  Component Value Date   TSH 5.59 (H) 07/25/2023

## 2023-07-29 LAB — INTRINSIC FACTOR ANTIBODIES: Intrinsic Factor: NEGATIVE

## 2023-07-31 NOTE — Telephone Encounter (Unsigned)
 Copied from CRM [id number]. Topic: Clinical - Prescription Issue >> Jul 28, 2023 12:12 PM Mercedes MATSU wrote: Reason for CRM: Pharmacy called needing to verify quantity, day supply on prescription for needles/syringes. Please contact [facility] mail order pharmacy [phone].

## 2023-08-01 ENCOUNTER — Encounter: Payer: Self-pay | Admitting: Otolaryngology

## 2023-08-02 ENCOUNTER — Encounter: Payer: Self-pay | Admitting: Internal Medicine

## 2023-08-06 ENCOUNTER — Encounter: Payer: Self-pay | Admitting: Internal Medicine

## 2023-08-07 ENCOUNTER — Other Ambulatory Visit: Payer: Self-pay | Admitting: Internal Medicine

## 2023-08-07 DIAGNOSIS — I10 Essential (primary) hypertension: Secondary | ICD-10-CM

## 2023-08-07 MED ORDER — TELMISARTAN 40 MG PO TABS: 40.0000 mg | ORAL_TABLET | Freq: Every day | ORAL | 0 refills | Status: DC

## 2023-08-07 NOTE — Assessment & Plan Note (Signed)
Confirmed with home readings < 150/90. Starting telmisartan given new onset  microalbuminuria.  Lab Results  Component Value Date   MICROALBUR 2.6 (H) 07/25/2023   MICROALBUR <0.7 12/19/2022

## 2023-08-09 NOTE — Telephone Encounter (Signed)
Pharmacy has been updated.

## 2023-08-14 ENCOUNTER — Encounter: Payer: Self-pay | Admitting: Internal Medicine

## 2023-08-14 DIAGNOSIS — I10 Essential (primary) hypertension: Secondary | ICD-10-CM

## 2023-08-15 ENCOUNTER — Other Ambulatory Visit (INDEPENDENT_AMBULATORY_CARE_PROVIDER_SITE_OTHER): Payer: Medicare Other

## 2023-08-15 DIAGNOSIS — E890 Postprocedural hypothyroidism: Secondary | ICD-10-CM | POA: Diagnosis not present

## 2023-08-15 DIAGNOSIS — E039 Hypothyroidism, unspecified: Secondary | ICD-10-CM

## 2023-08-15 DIAGNOSIS — I10 Essential (primary) hypertension: Secondary | ICD-10-CM | POA: Diagnosis not present

## 2023-08-15 NOTE — Addendum Note (Signed)
Addended by: Jarvis Morgan D on: 08/15/2023 10:17 AM   Modules accepted: Orders

## 2023-08-16 ENCOUNTER — Encounter: Payer: Self-pay | Admitting: Internal Medicine

## 2023-08-16 DIAGNOSIS — K080 Exfoliation of teeth due to systemic causes: Secondary | ICD-10-CM | POA: Diagnosis not present

## 2023-08-16 LAB — BASIC METABOLIC PANEL
BUN: 12 mg/dL (ref 7–25)
CO2: 28 mmol/L (ref 20–32)
Calcium: 9.8 mg/dL (ref 8.6–10.4)
Chloride: 101 mmol/L (ref 98–110)
Creat: 0.79 mg/dL (ref 0.50–1.05)
Glucose, Bld: 97 mg/dL (ref 65–99)
Potassium: 4.1 mmol/L (ref 3.5–5.3)
Sodium: 141 mmol/L (ref 135–146)

## 2023-08-16 LAB — THYROID PANEL WITH TSH
Free Thyroxine Index: 3 (ref 1.4–3.8)
T3 Uptake: 31 % (ref 22–35)
T4, Total: 9.6 ug/dL (ref 5.1–11.9)
TSH: 2.52 m[IU]/L (ref 0.40–4.50)

## 2023-08-18 MED ORDER — TELMISARTAN 40 MG PO TABS: 40.0000 mg | ORAL_TABLET | Freq: Every day | ORAL | 1 refills | Status: DC

## 2023-08-22 MED ORDER — HYDROCHLOROTHIAZIDE 25 MG PO TABS: 25.0000 mg | ORAL_TABLET | Freq: Every day | ORAL | 0 refills | Status: DC

## 2023-08-22 NOTE — Addendum Note (Signed)
Addended by: Sherlene Shams on: 08/22/2023 12:58 PM   Modules accepted: Orders

## 2023-08-22 NOTE — Assessment & Plan Note (Addendum)
 At goal on telmisartan  and hydrochlorothiazide .  With  new home readings < 130/80.  Continue  dose increased to 40 mg  given new onset  microalbuminuria, and 25 mg hydrochlorothiazide   Lab Results  Component Value Date   MICROALBUR 2.6 (H) 07/25/2023   MICROALBUR <0.7 12/19/2022

## 2023-08-28 ENCOUNTER — Encounter: Payer: Self-pay | Admitting: Internal Medicine

## 2023-08-28 ENCOUNTER — Telehealth: Payer: Self-pay | Admitting: Internal Medicine

## 2023-08-28 NOTE — Telephone Encounter (Signed)
 Patient need lab orders.

## 2023-08-29 ENCOUNTER — Other Ambulatory Visit: Payer: Self-pay | Admitting: Internal Medicine

## 2023-08-29 MED ORDER — AMOXICILLIN 500 MG PO CAPS: 2000.0000 mg | ORAL_CAPSULE | Freq: Once | ORAL | 3 refills | Status: DC

## 2023-08-29 NOTE — Telephone Encounter (Signed)
Pt requesting for dental procedure

## 2023-09-04 ENCOUNTER — Other Ambulatory Visit: Payer: Medicare Other

## 2023-09-04 ENCOUNTER — Telehealth: Payer: Self-pay

## 2023-09-04 DIAGNOSIS — I10 Essential (primary) hypertension: Secondary | ICD-10-CM

## 2023-09-04 NOTE — Telephone Encounter (Signed)
 LMTCB

## 2023-09-04 NOTE — Telephone Encounter (Signed)
Called pt to cancel her lab appt per Dr. Darrick Huntsman. Pt stated that she would like to see if it would be okay to have a bmet checked before her next appt in July. Pt stated that she has become paranoid about taking the hydrochlorothiazide. She stated that her sister just spent 12 days in the hospital for very low sodium and potassium levels due to the hydrochlorothiazide. I have pended the lab for your approval.

## 2023-09-05 ENCOUNTER — Encounter: Payer: Self-pay | Admitting: Internal Medicine

## 2023-09-06 NOTE — Telephone Encounter (Signed)
 Sent pt a Well[facility] geologist.

## 2023-09-07 ENCOUNTER — Encounter: Payer: Self-pay | Admitting: Internal Medicine

## 2023-09-07 MED ORDER — AMOXICILLIN 500 MG PO CAPS: 2000.0000 mg | ORAL_CAPSULE | Freq: Once | ORAL | 1 refills | Status: AC

## 2023-09-11 ENCOUNTER — Encounter: Payer: Self-pay | Admitting: Internal Medicine

## 2023-09-19 DIAGNOSIS — M1711 Unilateral primary osteoarthritis, right knee: Secondary | ICD-10-CM | POA: Diagnosis not present

## 2023-09-21 ENCOUNTER — Other Ambulatory Visit: Payer: Self-pay | Admitting: Internal Medicine

## 2023-09-28 ENCOUNTER — Telehealth: Payer: Self-pay

## 2023-09-28 ENCOUNTER — Encounter: Payer: Self-pay | Admitting: Internal Medicine

## 2023-09-28 NOTE — Telephone Encounter (Signed)
 Is patient due for anything other than the BMET in March? I do believe that she was the one that requested to have the BMET drawn earlier because of what happened to her sister who was also taking hydrochlorothiazide.

## 2023-09-28 NOTE — Telephone Encounter (Signed)
 Copied from CRM [id number]. Topic: Clinical - Request for Lab/Test Order >> Sep 28, 2023  8:46 AM Theodis Sato wrote: Reason for CRM: Patient is requesting Dr. Elpidio Eric to order the labs tests the provider wants done. Patient states she wants to come in on March 19 around 8 AM - Please call patient back when orders are in so that she may schedule her lab appointment.

## 2023-09-29 ENCOUNTER — Encounter: Payer: Self-pay | Admitting: Internal Medicine

## 2023-09-29 DIAGNOSIS — I10 Essential (primary) hypertension: Secondary | ICD-10-CM

## 2023-10-01 ENCOUNTER — Encounter: Payer: Self-pay | Admitting: Internal Medicine

## 2023-10-01 NOTE — Assessment & Plan Note (Signed)
 Did not tolerate hydrochlorothiazide due to hypotension.  Spironolactone started.  BMET due

## 2023-10-03 NOTE — Telephone Encounter (Signed)
Lab appt has been scheduled.

## 2023-10-04 ENCOUNTER — Other Ambulatory Visit (INDEPENDENT_AMBULATORY_CARE_PROVIDER_SITE_OTHER)

## 2023-10-04 DIAGNOSIS — L814 Other melanin hyperpigmentation: Secondary | ICD-10-CM | POA: Diagnosis not present

## 2023-10-04 DIAGNOSIS — I10 Essential (primary) hypertension: Secondary | ICD-10-CM | POA: Diagnosis not present

## 2023-10-04 DIAGNOSIS — D2362 Other benign neoplasm of skin of left upper limb, including shoulder: Secondary | ICD-10-CM | POA: Diagnosis not present

## 2023-10-04 LAB — BASIC METABOLIC PANEL
BUN: 11 mg/dL (ref 6–23)
CO2: 34 meq/L — ABNORMAL HIGH (ref 19–32)
Calcium: 8.9 mg/dL (ref 8.4–10.5)
Chloride: 99 meq/L (ref 96–112)
Creatinine, Ser: 0.66 mg/dL (ref 0.40–1.20)
GFR: 93.8 mL/min (ref >60.00)
Glucose, Bld: 91 mg/dL (ref 70–99)
Potassium: 3.8 meq/L (ref 3.5–5.1)
Sodium: 142 meq/L (ref 135–145)

## 2023-10-04 NOTE — Telephone Encounter (Signed)
 Since pt is having the BMET done today does she need to keep her lab appointment for June?

## 2023-10-05 ENCOUNTER — Encounter: Payer: Self-pay | Admitting: Internal Medicine

## 2023-10-05 ENCOUNTER — Other Ambulatory Visit: Payer: Self-pay | Admitting: Internal Medicine

## 2023-10-06 ENCOUNTER — Encounter: Payer: Self-pay | Admitting: Internal Medicine

## 2023-10-06 NOTE — Discharge Instructions (Signed)
 Instructions after Total Knee Replacement   Lisa Valencia P. Angie Fava., M.D.    Dept. of Orthopaedics & Sports Medicine [facility] [address] [place], [place]  [address]  Phone: [phone]   Fax: [phone]       [email or web address]       DIET: Drink plenty of non-alcoholic fluids. Resume your normal diet. Include foods high in fiber.  ACTIVITY:  You may use crutches or a walker with weight-bearing as tolerated, unless instructed otherwise. You may be weaned off of the walker or crutches by your Physical Therapist.  Do NOT place pillows under the knee. Anything placed under the knee could limit your ability to straighten the knee.   Use the Bone Foam 3 times a day for 30 minutes each session to help straighten the knee. Continue doing gentle exercises. Exercising will reduce the pain and swelling, increase motion, and prevent muscle weakness.   Please continue to use the TED compression stockings for 6 weeks. You may remove the stockings at night, but should reapply them in the morning. Do not drive or operate any equipment until instructed.  WOUND CARE:  The initial dressing (Aquacel) can remain in place for 7 days (see separate instructions). Continue to use the PolarCare or ice packs periodically to reduce pain and swelling. You may bathe or shower after the staples are removed at the first office visit following surgery.  MEDICATIONS: You may resume your regular medications. Please take the pain medication as prescribed on the medication. Do not take pain medication on an empty stomach. Unless instructed otherwise, you should take an enteric-coated aspirin 81 mg. TWICE a day. (This along with elevation will help reduce the possibility of blood clots/phlebitis in your operated leg.) Use a stool softener (such as Senokot-S or Colace) daily and a laxative (such as Miralax or Dulcolax) as needed to prevent constipation.  Do not drive or drink alcoholic beverages when  taking pain medications.  CALL THE OFFICE FOR: Temperature above 101 degrees Excessive bleeding or drainage on the dressing. Excessive swelling, coldness, or paleness of the toes. Persistent nausea and vomiting.  FOLLOW-UP:  You should have an appointment to return to the office in 10-14 days after surgery. Arrangements have been made for continuation of Physical Therapy (either home therapy or outpatient therapy).     [facility] Department Directory         [email or web address]       [email or web address]          Cardiology  Appointments: [place] - [phone]  Endocrinology  Appointments: [place] [phone] [place] - [phone]  Gastroenterology  Appointments: [employer] [phone] [place] - [phone]        General Surgery   Appointments: [facility]  Internal Medicine/Family Medicine  Appointments: [facility] [place] - [phone] [place] - [phone]  Metabolic and Weigh Loss Surgery  Appointments: [facility]        Neurology  Appointments: [place] [phone] [place] - [phone]  Neurosurgery  Appointments: [place]  Obstetrics & Gynecology  Appointments: [place] [phone] [place] - [phone]        Pediatrics  Appointments: [place] [phone] [place] - [phone]  Physiatry  Appointments: [place] [phone]  Physical Therapy  Appointments: [place] - [phone]        Podiatry  Appointments: [place] [phone] [place] - [phone]  Pulmonology  Appointments: [place] [facility]  Rheumatology  Appointments: [place] [phone]  [place] Location: [facility]  [address] [place], [place]  [address]  [place] Location: [facility]. [address] [place], [place]  [address]  [place]  Location: [facility] [address] [place], [place]  [address]

## 2023-10-06 NOTE — H&P (Signed)
 ORTHOPAEDIC HISTORY & PHYSICAL Amonte Brookover, Adelina Mings., MD - 09/19/2023 10:00 AM EST Formatting of this note is different from the original. Images from the original note were not included. Chief Complaint: Chief Complaint Patient presents with Right knee degenerative arthrosis  Reason for Visit: The patient is a 63 y.o. female who presents today for reevaluation of her left knee. She reports a 6-8 month history of progressive right knee pain. She localizes most of the pain along the lateral aspect of the knee. She reports some swelling, no locking, and some giving way of the knee. She reports significant startup stiffness. The pain is aggravated by any weight bearing and rising after sitting. The knee pain limits the patient's ability to ambulate long distances. The patient has not appreciated any significant improvement despite Tylenol, NSAIDs, and activity modification. She is not using any ambulatory aids. The patient states that the knee pain has progressed to the point that it is significantly interfering with her activities of daily living.  Medications: Current Outpatient Medications Medication Sig Dispense Refill aspirin 81 MG EC tablet Take 81 mg by mouth once daily.  celecoxib (CELEBREX) 200 MG capsule Take 200 mg by mouth once daily cetirizine (ZYRTEC) 10 MG tablet Take 10 mg by mouth once daily conjugated estrogens (PREMARIN) 0.625 mg/gram vaginal cream Place one applicatorful vaginally two to three times weekly cyanocobalamin (VITAMIN B12) 1000 MCG tablet Take 1,000 mcg by mouth once daily ergocalciferol, vitamin D2, (VITAMIN D2) 1,250 mcg (50,000 unit) capsule Take 50,000 Units by mouth once a week omega-3 fatty acids/fish oil 340-1,000 mg capsule Take by mouth once daily.  omeprazole (PRILOSEC) 20 MG DR capsule Take 20 mg by mouth 2 (two) times daily before meals simvastatin (ZOCOR) 40 MG tablet Take 40 mg by mouth nightly spironolactone (ALDACTONE) 50 MG tablet Take 50 mg  by mouth once daily as needed.  telmisartan (MICARDIS) 40 MG tablet Take 40 mg by mouth at bedtime vitamin E acetate (VITAMIN E ORAL) Take 500 Int'l Units/day by mouth once daily  No current facility-administered medications for this visit.  Allergies: Allergies Allergen Reactions Bupropion Hives Sulfa (Sulfonamide Antibiotics) Swelling Mouth swelling Wellbutrin [Bupropion Hcl] Hives In eyes and mouth Chantix [Varenicline] Nausea Codeine Sulfate Nausea Fenofibrate Muscle Pain Sudafed [Pseudoephedrine Hcl] Palpitations  Past Medical History: Past Medical History: Diagnosis Date Anxiety Arthritis Bulging lumbar disc Depression Diabetes mellitus type 2, uncomplicated (CMS/HHS-HCC) Diverticulosis 2010 Esophagitis 2010 GERD (gastroesophageal reflux disease) Hyperlipidemia Thyroid nodule  Past Surgical History: Past Surgical History: Procedure Laterality Date TONSILLECTOMY 1978 APPENDECTOMY 1985 HYSTERECTOMY 2009 partial Right total hip arthroplasty 11/22/2010 COLONOSCOPY 06/05/2015 Left total hip arthroplasty 05/02/2018 Dr Ernest Pine ultrasound guided breast biopsy 01/26/2022 THYROIDECTOMY TOTAL 05/29/2023 ASPIRATION CYST BREAST Right  Social History: Social History  Socioeconomic History Marital status: Married Spouse name: Onalee Hua Number of children: 0 Years of education: 15 Occupational History Occupation: Retired -Charity fundraiser Tobacco Use Smoking status: Every Day Current packs/day: 0.50 Average packs/day: 0.5 packs/day for 45.0 years (22.5 ttl pk-yrs) Types: Cigarettes Smokeless tobacco: Never Tobacco comments: current some day smoke 2-3 cigerettes a day Vaping Use Vaping status: Never Used Substance and Sexual Activity Alcohol use: Yes Alcohol/week: 0.0 standard drinks of alcohol Comment: Rarely Drug use: No Sexual activity: Defer Partners: Male  Social Drivers of Health  Financial Resource Strain: Low Risk (07/25/2023) Received from [facility] Overall  Financial Resource Strain (CARDIA) Difficulty of Paying Living Expenses: Not hard at all Food Insecurity: No Food Insecurity (07/25/2023) Received from [facility] Hunger Vital Sign Worried About  Running Out of Food in the Last Year: Never true Ran Out of Food in the Last Year: Never true Transportation Needs: No Transportation Needs (07/25/2023) Received from [facility] - Transportation Lack of Transportation (Medical): No Lack of Transportation (Non-Medical): No Physical Activity: Insufficiently Active (07/25/2023) Received from [facility] Exercise Vital Sign Days of Exercise per Week: 1 day Minutes of Exercise per Session: 20 min Stress: No Stress Concern Present (07/25/2023) Received from [facility] of Occupational Health - Occupational Stress Questionnaire Feeling of Stress : Only a little Social Connections: Socially Integrated (07/25/2023) Received from [facility] Social Connection and Isolation Panel [NHANES] Frequency of Communication with Friends and Family: Three times a week Frequency of Social Gatherings with Friends and Family: Twice a week Attends Religious Services: More than 4 times per year Active Member of [employer] or Organizations: Yes Attends Engineer, structural: More than 4 times per year Marital Status: Married Housing Stability: Unknown (09/19/2023) Housing Stability Vital Sign Homeless in the Last Year: No  Family History: Family History Problem Relation Name Age of Onset Hyperlipidemia (Elevated cholesterol) Mother Hyperlipidemia (Elevated cholesterol) Father Coronary Artery Disease (Blocked arteries around heart) Father Lung cancer Father Diabetes type I Sister Diabetes Maternal Grandfather Colon cancer Maternal Grandfather Colon cancer Paternal Grandmother Colon cancer Paternal Grandfather Pancreatic cancer Maternal Aunt Breast cancer Paternal Aunt 30  Review of Systems: A comprehensive 14 point ROS was performed,  reviewed, and the pertinent orthopaedic findings are documented in the HPI.  Exam BP 118/84  Ht 180.3 cm (5\' 11" )  Wt (!) 133.9 kg (295 lb 3.2 oz)  BMI 41.17 kg/m  General: Well-developed, well-nourished female seen in no acute distress. Antalgic gait. Valgus thrust to the right knee.  HEENT: Atraumatic, normocephalic. Pupils are equal and reactive to light. Extraocular motion is intact. Sclera are clear. Oropharynx is clear with moist mucosa.  Neck: Supple, nontender, and with good ROM. No thyromegaly, adenopathy, JVD, or carotid bruits.  Lungs: Clear to auscultation bilaterally.  Cardiovascular: Regular rate and rhythm. Normal S1, S2. No murmur. No appreciable gallops or rubs. Peripheral pulses are palpable. No lower extremity edema. Homan`s test is negative.  Abdomen: Soft, nontender, nondistended. Bowel sounds are present.  Extremities: Good strength, stability, and range of motion of the upper extremities. Good range of motion of the hips and ankles.  Right Knee: Soft tissue swelling: mild Effusion: none Erythema: none Crepitance: mild Tenderness: medial, lateral Alignment: relative valgus Mediolateral laxity: lateral pseudolaxity Posterior sag: negative Patellar tracking: Good tracking without evidence of subluxation or tilt Atrophy: No significant atrophy. Quadriceps tone was fair to good. Range of motion: 0/2/103 degrees  Neurologic: Awake, alert, and oriented. Sensory function is intact to pinprick and light touch. Motor strength is judged to be 5/5. Motor coordination is within normal limits. No apparent clonus. No tremor.  X-rays: I ordered and interpreted standing AP, lateral, and sunrise radiographs of the right knee that were obtained in the office today. There is significant narrowing of the lateral cartilage space with bone-on-bone articulation and associated valgus alignment. Osteophyte formation is noted. Subchondral sclerosis is noted. No  evidence of fracture or dislocation.  Impression: Degenerative arthrosis of the right knee  Plan: The findings were discussed in detail with the patient. The patient was given informational material on total knee replacement. Conservative treatment options were reviewed with the patient. We discussed the risks and benefits of surgical intervention. The usual perioperative course was also discussed in detail. The patient expressed understanding of  the risks and benefits of surgical intervention and would like to proceed with plans for right total knee arthroplasty.  Body mass index is 41.17 kg/m. Obesity has been associated with hypertension, diabetes, dyslipidemia, obstructive sleep apnea, arthritis, back pain, and depression. Obesity is most commonly caused by sedentary lifestyle and increased calorie intake. The patient should increase physical activity (especially low impact exercise) and reduce dietary intake (e.g. small portion via small plate size, avoid starving yourself, avoid highly processed food). Body mass index greater than 40 has been associated with increased risk for perioperative complications including poor wound healing, surgical site infection, deep venous thrombosis, and cardiopulmonary complications. The patient needs to achieve a target weight of 287 pounds to reach a BMI of 40. Wt Readings from Last 3 Encounters: 09/19/23 (!) 133.9 kg (295 lb 3.2 oz) 05/02/23 (!) 132.6 kg (292 lb 6.4 oz) 04/28/22 (!) 127 kg (280 lb)  I spent a total of 45 minutes in both face-to-face and non-face-to-face activities, excluding procedures performed, for this visit on the date of this encounter.  MEDICAL CLEARANCE: Per anesthesiology. ACTIVITY: As tolerated. WORK STATUS: Not applicable. THERAPY: Preoperative physical therapy evaluation. MEDICATIONS: Requested Prescriptions  No prescriptions requested or ordered in this encounter  FOLLOW-UP: Return for preop History & Physical  pending surgery date.  Zebulon Gantt P. Angie Fava., M.D.  This note was generated in part with voice recognition software and I apologize for any typographical errors that were not detected and corrected.

## 2023-10-09 ENCOUNTER — Ambulatory Visit (INDEPENDENT_AMBULATORY_CARE_PROVIDER_SITE_OTHER): Payer: Medicare Other

## 2023-10-09 ENCOUNTER — Encounter
Admission: RE | Admit: 2023-10-09 | Discharge: 2023-10-09 | Disposition: A | Discharge status: Home / Self Care | Source: Ambulatory Visit | Attending: Orthopedic Surgery | Admitting: Orthopedic Surgery

## 2023-10-09 ENCOUNTER — Other Ambulatory Visit: Payer: Self-pay | Admitting: Internal Medicine

## 2023-10-09 ENCOUNTER — Other Ambulatory Visit: Payer: Self-pay

## 2023-10-09 ENCOUNTER — Encounter: Payer: Self-pay | Admitting: Internal Medicine

## 2023-10-09 VITALS — BP 130/90 | Ht 70.0 in | Wt 296.0 lb

## 2023-10-09 VITALS — BP 131/84 | HR 71 | Resp 12 | Ht 70.0 in | Wt 296.0 lb

## 2023-10-09 DIAGNOSIS — M1711 Unilateral primary osteoarthritis, right knee: Secondary | ICD-10-CM | POA: Insufficient documentation

## 2023-10-09 DIAGNOSIS — E782 Mixed hyperlipidemia: Secondary | ICD-10-CM | POA: Diagnosis not present

## 2023-10-09 DIAGNOSIS — K760 Fatty (change of) liver, not elsewhere classified: Secondary | ICD-10-CM | POA: Insufficient documentation

## 2023-10-09 DIAGNOSIS — Z0000 Encounter for general adult medical examination without abnormal findings: Secondary | ICD-10-CM | POA: Diagnosis not present

## 2023-10-09 DIAGNOSIS — Z01818 Encounter for other preprocedural examination: Secondary | ICD-10-CM | POA: Insufficient documentation

## 2023-10-09 DIAGNOSIS — I10 Essential (primary) hypertension: Secondary | ICD-10-CM

## 2023-10-09 DIAGNOSIS — R7303 Prediabetes: Secondary | ICD-10-CM | POA: Insufficient documentation

## 2023-10-09 HISTORY — DX: Hypothyroidism, unspecified: E03.9

## 2023-10-09 HISTORY — DX: Hyperlipidemia, unspecified: E78.5

## 2023-10-09 HISTORY — DX: Fatty (change of) liver, not elsewhere classified: K76.0

## 2023-10-09 HISTORY — DX: Essential (primary) hypertension: I10

## 2023-10-09 HISTORY — DX: Prediabetes: R73.03

## 2023-10-09 HISTORY — DX: Long term (current) use of aspirin: Z79.82

## 2023-10-09 LAB — HEMOGLOBIN A1C
Hgb A1c MFr Bld: 6.1 % — ABNORMAL HIGH (ref 4.8–5.6)
Mean Plasma Glucose: 128.37 mg/dL

## 2023-10-09 LAB — URINALYSIS, ROUTINE W REFLEX MICROSCOPIC
Bilirubin Urine: NEGATIVE
Glucose, UA: NEGATIVE mg/dL
Hgb urine dipstick: NEGATIVE
Ketones, ur: NEGATIVE mg/dL
Leukocytes,Ua: NEGATIVE
Nitrite: NEGATIVE
Protein, ur: NEGATIVE mg/dL
Specific Gravity, Urine: 1.013 (ref 1.005–1.030)
pH: 6 (ref 5.0–8.0)

## 2023-10-09 LAB — CBC
HCT: 39.8 % (ref 36.0–46.0)
Hemoglobin: 13.4 g/dL (ref 12.0–15.0)
MCH: 27.5 pg (ref 26.0–34.0)
MCHC: 33.7 g/dL (ref 30.0–36.0)
MCV: 81.6 fL (ref 80.0–100.0)
Platelets: 232 10*3/uL (ref 150–400)
RBC: 4.88 MIL/uL (ref 3.87–5.11)
RDW: 14.5 % (ref 11.5–15.5)
WBC: 6.9 10*3/uL (ref 4.0–10.5)
nRBC: 0 % (ref 0.0–0.2)

## 2023-10-09 LAB — C-REACTIVE PROTEIN: CRP: 0.6 mg/dL (ref <1.0)

## 2023-10-09 LAB — HEPATIC FUNCTION PANEL
ALT: 26 U/L (ref 0–44)
AST: 22 U/L (ref 15–41)
Albumin: 4.1 g/dL (ref 3.5–5.0)
Alkaline Phosphatase: 46 U/L (ref 38–126)
Bilirubin, Direct: 0.1 mg/dL (ref 0.0–0.2)
Indirect Bilirubin: 0.6 mg/dL (ref 0.3–0.9)
Total Bilirubin: 0.7 mg/dL (ref 0.0–1.2)
Total Protein: 7.6 g/dL (ref 6.5–8.1)

## 2023-10-09 LAB — SURGICAL PCR SCREEN
MRSA, PCR: NEGATIVE
Staphylococcus aureus: NEGATIVE

## 2023-10-09 LAB — SEDIMENTATION RATE: Sed Rate: 20 mm/h (ref 0–30)

## 2023-10-09 MED ORDER — TELMISARTAN 80 MG PO TABS: 80.0000 mg | ORAL_TABLET | Freq: Every day | ORAL | 1 refills | Status: DC

## 2023-10-09 NOTE — Patient Instructions (Signed)
 Lisa Valencia , Thank you for taking time to come for your Medicare Wellness Visit. I appreciate your ongoing commitment to your health goals. Please review the following plan we discussed and let me know if I can assist you in the future.   Referrals/Orders/Follow-Ups/Clinician Recommendations: Remember to get your Pneumonia vaccine at your next follow up appointment with your provider.   This is a list of the screening recommended for you and due dates:  Health Maintenance  Topic Date Due   Pneumococcal Vaccination (1 of 2 - PCV) Never done   HIV Screening  Never done   Screening for Lung Cancer  Never done   Flu Shot  10/16/2023*   COVID-19 Vaccine (4 - 2024-25 season) 10/24/2024*   Mammogram  01/12/2024   Hemoglobin A1C  04/10/2024   Yearly kidney health urinalysis for diabetes  07/24/2024   Yearly kidney function blood test for diabetes  10/03/2024   Medicare Annual Wellness Visit  10/08/2024   Colon Cancer Screening  06/13/2025   Pap with HPV screening  08/24/2025   DTaP/Tdap/Td vaccine (3 - Td or Tdap) 11/14/2029   Hepatitis C Screening  Completed   Zoster (Shingles) Vaccine  Completed   HPV Vaccine  Aged Out  *Topic was postponed. The date shown is not the original due date.    Advanced directives: (Copy Requested) Please bring a copy of your health care power of attorney and living will to the office to be added to your chart at your convenience. You can mail to [facility] [address] [place], [place] [address] or email to [email or web address]  Next Medicare Annual Wellness Visit scheduled for next year: Yes

## 2023-10-09 NOTE — Patient Instructions (Addendum)
 Your procedure is scheduled on: Wednesday 10/18/23 To find out your arrival time, please call [phone] between 1PM - 3PM on:  Tuesday 10/17/23  Report to the Registration Desk on the 1st floor of the [facility]. Free Valet parking is available.  If your arrival time is 6:00 am, do not arrive before that time as the [facility] entrance doors do not open until 6:00 am.  REMEMBER: Instructions that are not followed completely may result in serious medical risk, up to and including death; or upon the discretion of your surgeon and anesthesiologist your surgery may need to be rescheduled.  Do not eat food after midnight the night before surgery.  No gum chewing or hard candies.  You may however, drink CLEAR liquids up to 2 hours before you are scheduled to arrive for your surgery. Do not drink anything within 2 hours of your scheduled arrival time.  Clear liquids include: - water  - apple juice without pulp - gatorade (not RED colors) - black coffee or tea (Do NOT add milk or creamers to the coffee or tea) Do NOT drink anything that is not on this list.  Type 1 and Type 2 diabetics should only drink water.  In addition, your doctor has ordered for you to drink the provided:  Gatorade G2 Drinking this carbohydrate drink up to two hours before surgery helps to reduce insulin resistance and improve patient outcomes. Please complete drinking 2 hours before scheduled arrival time.  One week prior to surgery: Stop Anti-inflammatories (NSAIDS) such as Advil, Aleve, Ibuprofen, Motrin, Naproxen, Naprosyn and Aspirin based products such as Excedrin, Goody's Powder, BC Powder. Stop Aspirin on Wednesday 10/11/23 You may however, continue to take Tylenol if needed for pain up until the day of surgery.  Stop ANY OVER THE COUNTER supplements and vitamins until after surgery. Wednesday 10/11/23  Continue taking all prescribed medications.  TAKE ONLY THESE MEDICATIONS THE MORNING OF SURGERY WITH A  SIP OF WATER:  cetirizine (ZYRTEC) 10 MG tablet  levothyroxine (SYNTHROID) 112 MCG tablet  omeprazole (PRILOSEC) 20 MG capsule Antacid (take one the night before and one on the morning of surgery - helps to prevent nausea after surgery.)  No Alcohol for 24 hours before or after surgery.  No Smoking including e-cigarettes for 24 hours before surgery.  No chewable tobacco products for at least 6 hours before surgery.  No nicotine patches on the day of surgery.  Do not use any "recreational" drugs for at least a week (preferably 2 weeks) before your surgery.  Please be advised that the combination of cocaine and anesthesia may have negative outcomes, up to and including death. If you test positive for cocaine, your surgery will be cancelled.  On the morning of surgery brush your teeth with toothpaste and water, you may rinse your mouth with mouthwash if you wish. Do not swallow any toothpaste or mouthwash.  Use CHG Soap or wipes as directed on instruction sheet.  Do not wear lotions, powders, or perfumes.   Do not shave body hair from the neck down 48 hours before surgery.  Wear comfortable clothing (specific to your surgery type) to the hospital.  Do not wear jewelry, make-up, hairpins, clips or nail polish.  For welded (permanent) jewelry: bracelets, anklets, waist bands, etc.  Please have this removed prior to surgery.  If it is not removed, there is a chance that hospital personnel will need to cut it off on the day of surgery. Contact lenses, hearing aids and  dentures may not be worn into surgery.  Do not bring valuables to the hospital. [facility] is not responsible for any missing/lost belongings or valuables.   Notify your doctor if there is any change in your medical condition (cold, fever, infection).  If you are being discharged the day of surgery, you will not be allowed to drive home. You will need a responsible individual to drive you home and stay with you for 24  hours after surgery.   If you are taking public transportation, you will need to have a responsible individual with you.  If you are being admitted to the hospital overnight, leave your suitcase in the car. After surgery it may be brought to your room.  In case of increased patient census, it may be necessary for you, the patient, to continue your postoperative care in the Same Day Surgery department.  After surgery, you can help prevent lung complications by doing breathing exercises.  Take deep breaths and cough every 1-2 hours. Your doctor may order a device called an Incentive Spirometer to help you take deep breaths. When coughing or sneezing, hold a pillow firmly against your incision with both hands. This is called "splinting." Doing this helps protect your incision. It also decreases belly discomfort.  Surgery Visitation Policy:  Patients undergoing a surgery or procedure may have two family members or support persons with them as long as the person is not COVID-19 positive or experiencing its symptoms.   Inpatient Visitation:    Visiting hours are 7 a.m. to 8 p.m. Up to four visitors are allowed at one time in a patient room. The visitors may rotate out with other people during the day. One designated support person (adult) may remain overnight.  Due to an increase in RSV and influenza rates and associated hospitalizations, children ages 28 and under will not be able to visit patients in [facility]. Masks continue to be strongly recommended.  Please call the Pre-admissions Testing Dept. at [phone] if you have any questions about these instructions.    Pre-operative 5 CHG Bath Instructions   You can play a key role in reducing the risk of infection after surgery. Your skin needs to be as free of germs as possible. You can reduce the number of germs on your skin by washing with CHG (chlorhexidine gluconate) soap before surgery. CHG is an antiseptic soap that  kills germs and continues to kill germs even after washing.   DO NOT use if you have an allergy to chlorhexidine/CHG or antibacterial soaps. If your skin becomes reddened or irritated, stop using the CHG and notify one of our RNs at [phone].   Please shower with the CHG soap starting 4 days before surgery using the following schedule:   Saturday 10/14/23 - Wednesday 10/18/23    Please keep in mind the following:  DO NOT shave, including legs and underarms, starting the day of your first shower.   You may shave your face at any point before/day of surgery.  Place clean sheets on your bed the day you start using CHG soap. Use a clean washcloth (not used since being washed) for each shower. DO NOT sleep with pets once you start using the CHG.   CHG Shower Instructions:  If you choose to wash your hair and private area, wash first with your normal shampoo/soap.  After you use shampoo/soap, rinse your hair and body thoroughly to remove shampoo/soap residue.  Turn the water OFF and apply about 3  tablespoons (45 ml) of CHG soap to a CLEAN washcloth.  Apply CHG soap ONLY FROM YOUR NECK DOWN TO YOUR TOES (washing for 3-5 minutes)  DO NOT use CHG soap on face, private areas, open wounds, or sores.  Pay special attention to the area where your surgery is being performed.  If you are having back surgery, having someone wash your back for you may be helpful. Wait 2 minutes after CHG soap is applied, then you may rinse off the CHG soap.  Pat dry with a clean towel  Put on clean clothes/pajamas   If you choose to wear lotion, please use ONLY the CHG-compatible lotions on the back of this paper.     Additional instructions for the day of surgery: DO NOT APPLY any lotions, deodorants, cologne, or perfumes.   Put on clean/comfortable clothes.  Brush your teeth.  Ask your nurse before applying any prescription medications to the skin.      CHG Compatible Lotions   Aveeno Moisturizing  lotion  Cetaphil Moisturizing Cream  Cetaphil Moisturizing Lotion  Clairol Herbal Essence Moisturizing Lotion, Dry Skin  Clairol Herbal Essence Moisturizing Lotion, Extra Dry Skin  Clairol Herbal Essence Moisturizing Lotion, Normal Skin  Curel Age Defying Therapeutic Moisturizing Lotion with Alpha Hydroxy  Curel Extreme Care Body Lotion  Curel Soothing Hands Moisturizing Hand Lotion  Curel Therapeutic Moisturizing Cream, Fragrance-Free  Curel Therapeutic Moisturizing Lotion, Fragrance-Free  Curel Therapeutic Moisturizing Lotion, Original Formula  Eucerin Daily Replenishing Lotion  Eucerin Dry Skin Therapy Plus Alpha Hydroxy Crme  Eucerin Dry Skin Therapy Plus Alpha Hydroxy Lotion  Eucerin Original Crme  Eucerin Original Lotion  Eucerin Plus Crme Eucerin Plus Lotion  Eucerin TriLipid Replenishing Lotion  Keri Anti-Bacterial Hand Lotion  Keri Deep Conditioning Original Lotion Dry Skin Formula Softly Scented  Keri Deep Conditioning Original Lotion, Fragrance Free Sensitive Skin Formula  Keri Lotion Fast Absorbing Fragrance Free Sensitive Skin Formula  Keri Lotion Fast Absorbing Softly Scented Dry Skin Formula  Keri Original Lotion  Keri Skin Renewal Lotion Keri Silky Smooth Lotion  Keri Silky Smooth Sensitive Skin Lotion  Nivea Body Creamy Conditioning Oil  Nivea Body Extra Enriched Teacher, adult education Moisturizing Lotion Nivea Crme  Nivea Skin Firming Lotion  NutraDerm 30 Skin Lotion  NutraDerm Skin Lotion  NutraDerm Therapeutic Skin Cream  NutraDerm Therapeutic Skin Lotion  ProShield Protective Hand Cream  Provon moisturizing lotion

## 2023-10-09 NOTE — Assessment & Plan Note (Signed)
 Did not tolerate hydrochlorothiazide due to hypotension.  Spironolactone started.   BP still above goal.  Increase telmisartan to 80 mg daily

## 2023-10-09 NOTE — Progress Notes (Signed)
 Subjective:   Lisa Valencia is a 63 y.o. who presents for a Medicare Wellness preventive visit.  Visit Complete: Virtual I connected with  Lisa Valencia on 10/09/23 by a audio enabled telemedicine application and verified that I am speaking with the correct person using two identifiers.  Patient Location: Home  Provider Location: Home Office  I discussed the limitations of evaluation and management by telemedicine. The patient expressed understanding and agreed to proceed.  Vital Signs: Because this visit was a virtual/telehealth visit, some criteria may be missing or patient reported. Any vitals not documented were not able to be obtained and vitals that have been documented are patient reported.  VideoDeclined- This patient declined Librarian, academic. Therefore the visit was completed with audio only.  Persons Participating in Visit: Patient.  AWV Questionnaire: No: Patient Medicare AWV questionnaire was not completed prior to this visit.  Cardiac Risk Factors include: advanced age (>64men, >60 women);obesity (BMI >30kg/m2)     Objective:    Today's Vitals   10/09/23 1546 10/09/23 1549  BP: (!) 130/90   Weight: 296 lb (134.3 kg)   Height: 5\' 10"  (1.778 m)   PainSc:  4    Body mass index is 42.47 kg/m.     10/09/2023    3:57 PM 10/09/2023   10:30 AM 09/02/2022    3:52 PM 08/25/2021    9:15 AM 05/02/2018    4:21 PM 05/02/2018    9:39 AM 04/19/2018   11:56 AM  Advanced Directives  Does Patient Have a Medical Advance Directive? Yes Yes No Yes Yes Yes No  Type of Estate agent of [place];Living will   Healthcare Power of [place];Living will Living will Healthcare Power of Attorney   Does patient want to make changes to medical advance directive?  No - Patient declined  No - Patient declined No - Patient declined    Copy of Healthcare Power of Attorney in Chart? No - copy requested   Yes - validated most recent copy  scanned in chart (See row information) No - copy requested Yes   Would patient like information on creating a medical advance directive?   No - Patient declined  No - Patient declined  Yes (MAU/Ambulatory/Procedural Areas - Information given)    Current Medications (verified) Outpatient Encounter Medications as of 10/09/2023  Medication Sig   acetaminophen (TYLENOL) 500 MG tablet Take 1,000 mg by mouth every 6 (six) hours as needed for moderate pain (pain score 4-6).   aspirin EC 81 MG tablet Take 81 mg by mouth daily.   celecoxib (CELEBREX) 200 MG capsule Take 1 capsule (200 mg total) by mouth 2 (two) times daily.   cholecalciferol (VITAMIN D3) 25 MCG (1000 UNIT) tablet Take 2,000 Units by mouth once a week. Friday   cyanocobalamin (VITAMIN B12) 500 MCG tablet Take 500 mcg by mouth daily.   fexofenadine-pseudoephedrine (ALLEGRA-D 24) 180-240 MG 24 hr tablet Take 1 tablet by mouth at bedtime.   levothyroxine (SYNTHROID) 112 MCG tablet Take 1 tablet (112 mcg total) by mouth daily. (Patient taking differently: Take 112 mcg by mouth See admin instructions. Take 112 mcg by mouth daily, except on Sunday take 224 mcg)   methocarbamol (ROBAXIN) 500 MG tablet Take 500 mg by mouth every 6 (six) hours as needed for muscle spasms.   Omega-3 Fatty Acids (FISH OIL) 1000 MG CAPS Take 1,000 mg by mouth daily.    omeprazole (PRILOSEC) 20 MG capsule TAKE ONE CAPSULE BY  MOUTH TWICE DAILY BEFORE A MEAL   simvastatin (ZOCOR) 20 MG tablet Take 1 tablet (20 mg total) by mouth at bedtime.   spironolactone (ALDACTONE) 50 MG tablet TAKE 1 TABLET BY MOUTH DAILY. GENERIC EQUIVALENT FOR ALDACTONE   telmisartan (MICARDIS) 80 MG tablet Take 1 tablet (80 mg total) by mouth at bedtime.   vitamin E 200 UNIT capsule Take 200 Units by mouth daily.   cetirizine (ZYRTEC) 10 MG tablet Take 10 mg by mouth daily. (Patient not taking: Reported on 10/09/2023)   folic acid (FOLVITE) 1 MG tablet Take 1 tablet (1 mg total) by mouth daily.  (Patient not taking: Reported on 10/06/2023)   No facility-administered encounter medications on file as of 10/09/2023.    Allergies (verified) Sulfa antibiotics, Chantix [varenicline], Fexofenadine, Codeine sulfate, Fenofibrate, Pseudoephedrine hcl, and Wellbutrin [bupropion]   History: Past Medical History:  Diagnosis Date   Anxiety    Arthritis    Depression    Diverticulosis of intestine 2010   Esophagitis, reflux 2010   GERD (gastroesophageal reflux disease)    Hepatic steatosis    HLD (hyperlipidemia)    HTN (hypertension)    Hypothyroidism    Impaired fasting glucose 01/25/2012   Needs eye exam.     Long-term use of aspirin therapy    Pre-diabetes    Past Surgical History:  Procedure Laterality Date   ABDOMINAL HYSTERECTOMY  2009   supracervical    APPENDECTOMY     BREAST BIOPSY Right 01/26/2022   papilloma   BREAST CYST ASPIRATION Right    rt fna-neg   COLONOSCOPY WITH PROPOFOL N/A 06/05/2015   Procedure: COLONOSCOPY WITH PROPOFOL;  Surgeon: Midge Minium, MD;  Location: [facility];  Service: Endoscopy;  Laterality: N/A;  Pt requests early. Pt for antibiotic pre-procedure  CECAL PROTRUSION BX. / WITH CLIP LOT # [id number] RESOLUTION 360 EXP 03/03/2016 SIGMOID POLYP   JOINT REPLACEMENT Right 2012   hip, [name]   THYROIDECTOMY     TOTAL HIP ARTHROPLASTY Left 05/02/2018   Procedure: TOTAL HIP ARTHROPLASTY;  Surgeon: Donato Heinz, MD;  Location: [facility];  Service: Orthopedics;  Laterality: Left;   Family History  Problem Relation Age of Onset   Cancer Father        lung ca   Hyperlipidemia Father        PAD   Diabetes Sister    Cancer Paternal Aunt        breast   Breast cancer Paternal Aunt 30   Diabetes Maternal Grandfather    Cancer Maternal Grandfather        colon ca   Cancer Paternal Grandmother        colon   Cancer Paternal Grandfather        colon ca   Heart disease Neg Hx    Social History   Socioeconomic History   Marital  status: Married    Spouse name: david   Number of children: Not on file   Years of education: Not on file   Highest education level: Associate degree: academic program  Occupational History   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: [place]    Comment: has applied for disability  Tobacco Use   Smoking status: Former    Current packs/day: 1.00    Average packs/day: 1 pack/day for 35.0 years (35.0 ttl pk-yrs)    Types: Cigarettes   Smokeless tobacco: Never  Vaping Use   Vaping status: Never Used  Substance and Sexual  Activity   Alcohol use: Not Currently   Drug use: No   Sexual activity: Not on file  Other Topics Concern   Not on file  Social History Narrative   Not on file   Social Drivers of Health   Financial Resource Strain: Low Risk  (10/09/2023)   Overall Financial Resource Strain (CARDIA)    Difficulty of Paying Living Expenses: Not very hard  Food Insecurity: No Food Insecurity (10/09/2023)   Hunger Vital Sign    Worried About Running Out of Food in the Last Year: Never true    Ran Out of Food in the Last Year: Never true  Transportation Needs: No Transportation Needs (07/25/2023)   PRAPARE - Administrator, Civil Service (Medical): No    Lack of Transportation (Non-Medical): No  Physical Activity: Insufficiently Active (10/09/2023)   Exercise Vital Sign    Days of Exercise per Week: 3 days    Minutes of Exercise per Session: 20 min  Stress: No Stress Concern Present (10/09/2023)   [employer] of Occupational Health - Occupational Stress Questionnaire    Feeling of Stress : Not at all  Social Connections: Socially Integrated (10/09/2023)   Social Connection and Isolation Panel [NHANES]    Frequency of Communication with Friends and Family: Three times a week    Frequency of Social Gatherings with Friends and Family: Three times a week    Attends Religious Services: More than 4 times per year    Active Member of Clubs or Organizations: Yes     Attends Banker Meetings: 1 to 4 times per year    Marital Status: Married    Tobacco Counseling Counseling given: Yes    Clinical Intake:  Pre-visit preparation completed: Yes  Pain : 0-10 (R-knee pain) Pain Score: 4  Pain Type: Chronic pain Pain Location: Knee (R-knee) Pain Orientation: Right Pain Descriptors / Indicators: Sharp Pain Onset: Other (comment) (chronic) Pain Frequency: Constant Pain Relieving Factors: OTC Effect of Pain on Daily Activities: sometimes  Pain Relieving Factors: OTC  BMI - recorded: 42.47 Nutritional Status: BMI > 30  Obese Diabetes: No  Lab Results  Component Value Date   HGBA1C 6.1 (H) 10/09/2023   HGBA1C 6.1 06/22/2023   HGBA1C 6.0 12/19/2022     How often do you need to have someone help you when you read instructions, pamphlets, or other written materials from your doctor or pharmacy?: 1 - Never     Information entered by :: Alia T/CMA   Activities of Daily Living     10/09/2023    3:54 PM 10/09/2023   10:34 AM  In your present state of health, do you have any difficulty performing the following activities:  Hearing? 0   Vision? 0   Difficulty concentrating or making decisions? 0   Walking or climbing stairs? 0   Dressing or bathing? 0   Doing errands, shopping? 0 0  Preparing Food and eating ? N   Using the Toilet? N   In the past six months, have you accidently leaked urine? Y   Comment on going thing wears pad   Do you have problems with loss of bowel control? N   Managing your Medications? N   Managing your Finances? N   Housekeeping or managing your Housekeeping? N     Patient Care Team: Sherlene Shams, MD as PCP - General (Internal Medicine)  Indicate any recent Medical Services you may have received from other than [facility] providers in  the past year (date may be approximate).     Assessment:   This is a routine wellness examination for Lisa Valencia.  Hearing/Vision screen Hearing Screening -  Comments:: Pt denies any hearing def Vision Screening - Comments:: Denies w/vision even with glasses [facility]   Goals Addressed               This Visit's Progress     I would like to lose about 90lb (pt-stated)   On track     Walk for exercise Weight goal 200lb        Depression Screen     10/09/2023    3:53 PM 07/25/2023    1:39 PM 06/22/2023    9:41 AM 12/19/2022   10:12 AM 09/02/2022    3:42 PM 06/02/2022    9:09 AM 01/03/2022    8:57 AM  PHQ 2/9 Scores  PHQ - 2 Score 0 0 0 3 0 0 0  PHQ- 9 Score    13       Fall Risk     10/09/2023    3:52 PM 07/25/2023    1:39 PM 06/22/2023    9:41 AM 12/19/2022   10:12 AM 09/02/2022    3:44 PM  Fall Risk   Falls in the past year? 0 0 0 0 0  Number falls in past yr: 0 0 0 0 0  Injury with Fall? 0 0 0 0 0  Risk for fall due to : History of fall(s);Impaired balance/gait;Orthopedic patient No Fall Risks No Fall Risks No Fall Risks   Follow up Falls prevention discussed;Falls evaluation completed Falls evaluation completed Falls evaluation completed Falls evaluation completed Falls evaluation completed;Falls prevention discussed    MEDICARE RISK AT HOME:  Medicare Risk at Home Any stairs in or around the home?: No If so, are there any without handrails?: No Home free of loose throw rugs in walkways, pet beds, electrical cords, etc?: Yes Adequate lighting in your home to reduce risk of falls?: Yes Life alert?: No Use of a cane, walker or w/c?: No Grab bars in the bathroom?: Yes Shower chair or bench in shower?: Yes Elevated toilet seat or a handicapped toilet?: Yes  TIMED UP AND GO:  Was the test performed?  No  Cognitive Function: 6CIT completed        10/09/2023    3:57 PM 09/02/2022    3:52 PM  6CIT Screen  What Year? 0 points 0 points  What month? 0 points 0 points  What time? 0 points 0 points  Count back from 20 0 points 0 points  Months in reverse 0 points 0 points  Repeat phrase 0 points 0 points  Total Score  0 points 0 points    Immunizations Immunization History  Administered Date(s) Administered   Influenza Split 03/31/2014, 04/23/2015   Influenza,inj,Quad PF,6+ Mos 05/04/2018, 06/02/2022   Influenza-Unspecified 06/19/2017, 05/11/2019, 04/11/2020, 04/28/2021   Janssen (J&J) SARS-COV-2 Vaccination 10/24/2019, 05/15/2020   Moderna Covid-19 Vaccine Bivalent Booster 18yrs & up 04/28/2021   Moderna SARS-COV2 Booster Vaccination 12/25/2020   Td 11/15/2019   Tdap 07/01/2010   Zoster Recombinant(Shingrix) 04/28/2021, 08/04/2021    Screening Tests Health Maintenance  Topic Date Due   Pneumococcal Vaccine 23-72 Years old (1 of 2 - PCV) Never done   HIV Screening  Never done   Lung Cancer Screening  Never done   INFLUENZA VACCINE  10/16/2023 (Originally 02/16/2023)   COVID-19 Vaccine (4 - 2024-25 season) 10/24/2024 (Originally 03/19/2023)   MAMMOGRAM  01/12/2024   HEMOGLOBIN A1C  04/10/2024   Diabetic kidney evaluation - Urine ACR  07/24/2024   Diabetic kidney evaluation - eGFR measurement  10/03/2024   Medicare Annual Wellness (AWV)  10/08/2024   Colonoscopy  06/13/2025   Cervical Cancer Screening (HPV/Pap Cotest)  08/24/2025   DTaP/Tdap/Td (3 - Td or Tdap) 11/14/2029   Hepatitis C Screening  Completed   Zoster Vaccines- Shingrix  Completed   HPV VACCINES  Aged Out    Health Maintenance  Health Maintenance Due  Topic Date Due   Pneumococcal Vaccine 69-59 Years old (1 of 2 - PCV) Never done   HIV Screening  Never done   Lung Cancer Screening  Never done   Health Maintenance Items Addressed: See Nurse Notes  Additional Screening:  Vision Screening: Recommended annual ophthalmology exams for early detection of glaucoma and other disorders of the eye.  Dental Screening: Recommended annual dental exams for proper oral hygiene  Community Resource Referral / Chronic Care Management: CRR required this visit?  No   CCM required this visit?  No     Plan:     I have personally  reviewed and noted the following in the patient's chart:   Medical and social history Use of alcohol, tobacco or illicit drugs  Current medications and supplements including opioid prescriptions. Patient is not currently taking opioid prescriptions. Functional ability and status Nutritional status Physical activity Advanced directives List of other physicians Hospitalizations, surgeries, and ER visits in previous 12 months Vitals Screenings to include cognitive, depression, and falls Referrals and appointments  In addition, I have reviewed and discussed with patient certain preventive protocols, quality metrics, and best practice recommendations. A written personalized care plan for preventive services as well as general preventive health recommendations were provided to patient.     Arta Silence, CMA   10/09/2023   After Visit Summary: (MyChart) Due to this being a telephonic visit, the after visit summary with patients personalized plan was offered to patient via MyChart   Notes:  Per pt having Knee surgery on 10/18/23  Pt is also for her Pneumonia vaccine, HIV lab draw and Lung screening.

## 2023-10-15 ENCOUNTER — Encounter: Payer: Self-pay | Admitting: Orthopedic Surgery

## 2023-10-17 ENCOUNTER — Encounter: Payer: Self-pay | Admitting: Internal Medicine

## 2023-10-18 ENCOUNTER — Ambulatory Visit: Payer: Self-pay

## 2023-10-18 ENCOUNTER — Observation Stay
Admission: RE | Admit: 2023-10-18 | Discharge: 2023-10-19 | Disposition: A | Discharge status: Home / Self Care | Attending: Orthopedic Surgery | Admitting: Orthopedic Surgery

## 2023-10-18 ENCOUNTER — Observation Stay

## 2023-10-18 ENCOUNTER — Encounter
Admission: RE | Disposition: A | Discharge status: Home / Self Care | Payer: Self-pay | Source: Home / Self Care | Attending: Orthopedic Surgery

## 2023-10-18 ENCOUNTER — Ambulatory Visit: Payer: Self-pay | Admitting: Urgent Care

## 2023-10-18 ENCOUNTER — Other Ambulatory Visit: Payer: Self-pay

## 2023-10-18 ENCOUNTER — Encounter: Payer: Self-pay | Admitting: Orthopedic Surgery

## 2023-10-18 DIAGNOSIS — Z96651 Presence of right artificial knee joint: Secondary | ICD-10-CM | POA: Diagnosis not present

## 2023-10-18 DIAGNOSIS — Z79899 Other long term (current) drug therapy: Secondary | ICD-10-CM | POA: Diagnosis not present

## 2023-10-18 DIAGNOSIS — Z7982 Long term (current) use of aspirin: Secondary | ICD-10-CM | POA: Diagnosis not present

## 2023-10-18 DIAGNOSIS — Z96643 Presence of artificial hip joint, bilateral: Secondary | ICD-10-CM | POA: Diagnosis not present

## 2023-10-18 DIAGNOSIS — E119 Type 2 diabetes mellitus without complications: Secondary | ICD-10-CM | POA: Insufficient documentation

## 2023-10-18 DIAGNOSIS — R609 Edema, unspecified: Secondary | ICD-10-CM | POA: Diagnosis not present

## 2023-10-18 DIAGNOSIS — M1711 Unilateral primary osteoarthritis, right knee: Principal | ICD-10-CM

## 2023-10-18 DIAGNOSIS — R7303 Prediabetes: Secondary | ICD-10-CM

## 2023-10-18 DIAGNOSIS — F17210 Nicotine dependence, cigarettes, uncomplicated: Secondary | ICD-10-CM | POA: Insufficient documentation

## 2023-10-18 DIAGNOSIS — Z87891 Personal history of nicotine dependence: Secondary | ICD-10-CM | POA: Diagnosis not present

## 2023-10-18 DIAGNOSIS — Z01818 Encounter for other preprocedural examination: Secondary | ICD-10-CM

## 2023-10-18 DIAGNOSIS — I10 Essential (primary) hypertension: Secondary | ICD-10-CM | POA: Diagnosis not present

## 2023-10-18 HISTORY — PX: KNEE ARTHROPLASTY: SHX992

## 2023-10-18 LAB — GLUCOSE, CAPILLARY
Glucose-Capillary: 161 mg/dL — ABNORMAL HIGH (ref 70–99)
Glucose-Capillary: 174 mg/dL — ABNORMAL HIGH (ref 70–99)

## 2023-10-18 SURGERY — ARTHROPLASTY, KNEE, TOTAL, USING IMAGELESS COMPUTER-ASSISTED NAVIGATION
Anesthesia: Spinal | Site: Knee | Laterality: Right

## 2023-10-18 MED ORDER — CEFAZOLIN SODIUM-DEXTROSE 2-4 GM/100ML-% IV SOLN
INTRAVENOUS | Status: AC
  Filled 2023-10-18: qty 100

## 2023-10-18 MED ORDER — CHLORHEXIDINE GLUCONATE 4 % EX SOLN: 60.0000 mL | Freq: Once | CUTANEOUS | Status: DC

## 2023-10-18 MED ORDER — CHLORHEXIDINE GLUCONATE 0.12 % MT SOLN
15.0000 mL | Freq: Once | OROMUCOSAL | Status: DC
Start: 2023-10-18 — End: 2023-10-18

## 2023-10-18 MED ORDER — LEVOTHYROXINE SODIUM 112 MCG PO TABS
112.0000 ug | ORAL_TABLET | ORAL | Status: DC
  Administered 2023-10-19: 112 ug via ORAL

## 2023-10-18 MED ORDER — MIDAZOLAM HCL 5 MG/5ML IJ SOLN
INTRAMUSCULAR | Status: DC | PRN
Start: 2023-10-18 — End: 2023-10-18
  Administered 2023-10-18: 2 mg via INTRAVENOUS

## 2023-10-18 MED ORDER — SODIUM CHLORIDE (PF) 0.9 % IJ SOLN
INTRAMUSCULAR | Status: DC | PRN
  Administered 2023-10-18: 120 mL via INTRAMUSCULAR

## 2023-10-18 MED ORDER — DEXAMETHASONE SODIUM PHOSPHATE 10 MG/ML IJ SOLN
8.0000 mg | Freq: Once | INTRAMUSCULAR | Status: AC
  Administered 2023-10-18: 8 mg via INTRAVENOUS

## 2023-10-18 MED ORDER — ONDANSETRON HCL 4 MG/2ML IJ SOLN
INTRAMUSCULAR | Status: AC
  Filled 2023-10-18: qty 2

## 2023-10-18 MED ORDER — SIMVASTATIN 20 MG PO TABS
20.0000 mg | ORAL_TABLET | Freq: Every day | ORAL | Status: DC
  Administered 2023-10-18: 20 mg via ORAL
  Filled 2023-10-18: qty 1

## 2023-10-18 MED ORDER — ORAL CARE MOUTH RINSE
15.0000 mL | Freq: Once | OROMUCOSAL | Status: DC
Start: 2023-10-18 — End: 2023-10-18

## 2023-10-18 MED ORDER — GLYCOPYRROLATE 0.2 MG/ML IJ SOLN
INTRAMUSCULAR | Status: DC | PRN
Start: 2023-10-18 — End: 2023-10-18
  Administered 2023-10-18 (×2): .1 mg via INTRAVENOUS

## 2023-10-18 MED ORDER — LORATADINE 10 MG PO TABS
10.0000 mg | ORAL_TABLET | Freq: Every day | ORAL | Status: DC
  Administered 2023-10-19: 10 mg via ORAL
  Filled 2023-10-18: qty 1

## 2023-10-18 MED ORDER — OXYCODONE HCL 5 MG/5ML PO SOLN: 5.0000 mg | Freq: Once | ORAL | Status: DC | PRN

## 2023-10-18 MED ORDER — PROPOFOL 1000 MG/100ML IV EMUL
INTRAVENOUS | Status: AC
  Filled 2023-10-18: qty 100

## 2023-10-18 MED ORDER — TRANEXAMIC ACID-NACL 1000-0.7 MG/100ML-% IV SOLN
1000.0000 mg | Freq: Once | INTRAVENOUS | Status: AC
  Administered 2023-10-18: 1000 mg via INTRAVENOUS

## 2023-10-18 MED ORDER — KETAMINE HCL 50 MG/5ML IJ SOSY
PREFILLED_SYRINGE | INTRAMUSCULAR | Status: DC | PRN
  Administered 2023-10-18: 10 mg via INTRAVENOUS
  Administered 2023-10-18: 20 mg via INTRAVENOUS
  Administered 2023-10-18 (×2): 10 mg via INTRAVENOUS

## 2023-10-18 MED ORDER — FENTANYL CITRATE (PF) 100 MCG/2ML IJ SOLN
INTRAMUSCULAR | Status: DC | PRN
  Administered 2023-10-18: 50 ug via INTRAVENOUS

## 2023-10-18 MED ORDER — DEXMEDETOMIDINE HCL IN NACL 200 MCG/50ML IV SOLN
INTRAVENOUS | Status: DC | PRN
  Administered 2023-10-18 (×2): 4 ug via INTRAVENOUS
  Administered 2023-10-18 (×2): 8 ug via INTRAVENOUS
  Administered 2023-10-18: 4 ug via INTRAVENOUS
  Administered 2023-10-18: 8 ug via INTRAVENOUS
  Administered 2023-10-18: 4 ug via INTRAVENOUS

## 2023-10-18 MED ORDER — TRANEXAMIC ACID-NACL 1000-0.7 MG/100ML-% IV SOLN
INTRAVENOUS | Status: AC
  Filled 2023-10-18: qty 100

## 2023-10-18 MED ORDER — INSULIN ASPART 100 UNIT/ML IJ SOLN: 0.0000 [IU] | Freq: Every day | INTRAMUSCULAR | Status: DC

## 2023-10-18 MED ORDER — IRBESARTAN 150 MG PO TABS: 300.0000 mg | ORAL_TABLET | Freq: Every day | ORAL | Status: DC

## 2023-10-18 MED ORDER — ARTIFICIAL TEARS OPHTHALMIC OINT
TOPICAL_OINTMENT | OPHTHALMIC | Status: AC
  Filled 2023-10-18: qty 3.5

## 2023-10-18 MED ORDER — CELECOXIB 200 MG PO CAPS
400.0000 mg | ORAL_CAPSULE | Freq: Once | ORAL | Status: AC
  Administered 2023-10-18: 400 mg via ORAL

## 2023-10-18 MED ORDER — CEFAZOLIN SODIUM-DEXTROSE 3-4 GM/150ML-% IV SOLN
3.0000 g | INTRAVENOUS | Status: AC
  Administered 2023-10-18: 2 g via INTRAVENOUS
  Filled 2023-10-18: qty 150

## 2023-10-18 MED ORDER — PHENOL 1.4 % MT LIQD: 1.0000 | OROMUCOSAL | Status: DC | PRN

## 2023-10-18 MED ORDER — MAGNESIUM HYDROXIDE 400 MG/5ML PO SUSP
30.0000 mL | Freq: Every day | ORAL | Status: DC
  Administered 2023-10-18 – 2023-10-19 (×2): 30 mL via ORAL
  Filled 2023-10-18 (×2): qty 30

## 2023-10-18 MED ORDER — SODIUM CHLORIDE 0.9 % IR SOLN
Status: DC | PRN
  Administered 2023-10-18: 3000 mL

## 2023-10-18 MED ORDER — BUPIVACAINE HCL (PF) 0.5 % IJ SOLN
INTRAMUSCULAR | Status: AC
  Filled 2023-10-18: qty 10

## 2023-10-18 MED ORDER — LEVOTHYROXINE SODIUM 112 MCG PO TABS: 224.0000 ug | ORAL_TABLET | ORAL | Status: DC

## 2023-10-18 MED ORDER — FENTANYL CITRATE (PF) 100 MCG/2ML IJ SOLN
25.0000 ug | INTRAMUSCULAR | Status: DC | PRN
  Administered 2023-10-18 (×4): 25 ug via INTRAVENOUS

## 2023-10-18 MED ORDER — SENNOSIDES-DOCUSATE SODIUM 8.6-50 MG PO TABS
1.0000 | ORAL_TABLET | Freq: Two times a day (BID) | ORAL | Status: DC
  Administered 2023-10-18 – 2023-10-19 (×2): 1 via ORAL
  Filled 2023-10-18 (×2): qty 1

## 2023-10-18 MED ORDER — BISACODYL 10 MG RE SUPP: 10.0000 mg | Freq: Every day | RECTAL | Status: DC | PRN

## 2023-10-18 MED ORDER — IPRATROPIUM-ALBUTEROL 0.5-2.5 (3) MG/3ML IN SOLN
RESPIRATORY_TRACT | Status: AC
  Filled 2023-10-18: qty 3

## 2023-10-18 MED ORDER — FENTANYL CITRATE (PF) 100 MCG/2ML IJ SOLN
INTRAMUSCULAR | Status: AC
Start: 2023-10-18 — End: ?
  Filled 2023-10-18: qty 2

## 2023-10-18 MED ORDER — ONDANSETRON HCL 4 MG/2ML IJ SOLN: 4.0000 mg | Freq: Four times a day (QID) | INTRAMUSCULAR | Status: DC | PRN

## 2023-10-18 MED ORDER — DIPHENHYDRAMINE HCL 12.5 MG/5ML PO ELIX: 12.5000 mg | ORAL_SOLUTION | ORAL | Status: DC | PRN

## 2023-10-18 MED ORDER — FLEET ENEMA RE ENEM: 1.0000 | ENEMA | Freq: Once | RECTAL | Status: DC | PRN

## 2023-10-18 MED ORDER — SPIRONOLACTONE 25 MG PO TABS: 50.0000 mg | ORAL_TABLET | Freq: Every day | ORAL | Status: DC

## 2023-10-18 MED ORDER — GABAPENTIN 300 MG PO CAPS
300.0000 mg | ORAL_CAPSULE | Freq: Once | ORAL | Status: AC
  Administered 2023-10-18: 300 mg via ORAL

## 2023-10-18 MED ORDER — ACETAMINOPHEN 10 MG/ML IV SOLN
INTRAVENOUS | Status: DC | PRN
Start: 2023-10-18 — End: 2023-10-18
  Administered 2023-10-18: 1000 mg via INTRAVENOUS

## 2023-10-18 MED ORDER — DROPERIDOL 2.5 MG/ML IJ SOLN: 0.6250 mg | Freq: Once | INTRAMUSCULAR | Status: DC | PRN

## 2023-10-18 MED ORDER — METOCLOPRAMIDE HCL 10 MG PO TABS
10.0000 mg | ORAL_TABLET | Freq: Three times a day (TID) | ORAL | Status: DC
  Administered 2023-10-18 – 2023-10-19 (×3): 10 mg via ORAL
  Filled 2023-10-18 (×3): qty 1

## 2023-10-18 MED ORDER — INSULIN ASPART 100 UNIT/ML IJ SOLN: 0.0000 [IU] | Freq: Three times a day (TID) | INTRAMUSCULAR | Status: DC

## 2023-10-18 MED ORDER — LIDOCAINE HCL (PF) 2 % IJ SOLN
INTRAMUSCULAR | Status: AC
  Filled 2023-10-18: qty 5

## 2023-10-18 MED ORDER — DEXMEDETOMIDINE HCL IN NACL 80 MCG/20ML IV SOLN
INTRAVENOUS | Status: AC
  Filled 2023-10-18: qty 20

## 2023-10-18 MED ORDER — ONDANSETRON HCL 4 MG PO TABS: 4.0000 mg | ORAL_TABLET | Freq: Four times a day (QID) | ORAL | Status: DC | PRN

## 2023-10-18 MED ORDER — TRANEXAMIC ACID-NACL 1000-0.7 MG/100ML-% IV SOLN
1000.0000 mg | INTRAVENOUS | Status: AC
  Administered 2023-10-18: 1000 mg via INTRAVENOUS

## 2023-10-18 MED ORDER — BUPIVACAINE HCL (PF) 0.5 % IJ SOLN
INTRAMUSCULAR | Status: DC | PRN
  Administered 2023-10-18: 3 mL

## 2023-10-18 MED ORDER — ACETAMINOPHEN 10 MG/ML IV SOLN: 1000.0000 mg | Freq: Once | INTRAVENOUS | Status: DC | PRN

## 2023-10-18 MED ORDER — ACETAMINOPHEN 325 MG PO TABS: 325.0000 mg | ORAL_TABLET | Freq: Four times a day (QID) | ORAL | Status: DC | PRN

## 2023-10-18 MED ORDER — PROPOFOL 500 MG/50ML IV EMUL
INTRAVENOUS | Status: DC | PRN
  Administered 2023-10-18: 100 ug/kg/min via INTRAVENOUS

## 2023-10-18 MED ORDER — SURGIPHOR WOUND IRRIGATION SYSTEM - OPTIME: TOPICAL | Status: DC | PRN

## 2023-10-18 MED ORDER — GABAPENTIN 300 MG PO CAPS
ORAL_CAPSULE | ORAL | Status: AC
  Filled 2023-10-18: qty 1

## 2023-10-18 MED ORDER — CELECOXIB 200 MG PO CAPS
200.0000 mg | ORAL_CAPSULE | Freq: Two times a day (BID) | ORAL | Status: DC
  Administered 2023-10-18 – 2023-10-19 (×2): 200 mg via ORAL
  Filled 2023-10-18 (×2): qty 1

## 2023-10-18 MED ORDER — ALUM & MAG HYDROXIDE-SIMETH 200-200-20 MG/5ML PO SUSP: 30.0000 mL | ORAL | Status: DC | PRN

## 2023-10-18 MED ORDER — OXYCODONE HCL 5 MG PO TABS: 5.0000 mg | ORAL_TABLET | Freq: Once | ORAL | Status: DC | PRN

## 2023-10-18 MED ORDER — ACETAMINOPHEN 10 MG/ML IV SOLN
1000.0000 mg | Freq: Four times a day (QID) | INTRAVENOUS | Status: DC
  Administered 2023-10-18 – 2023-10-19 (×3): 1000 mg via INTRAVENOUS
  Filled 2023-10-18 (×2): qty 100

## 2023-10-18 MED ORDER — FENTANYL CITRATE (PF) 100 MCG/2ML IJ SOLN
INTRAMUSCULAR | Status: AC
  Filled 2023-10-18: qty 2

## 2023-10-18 MED ORDER — SODIUM CHLORIDE 0.9 % IV SOLN: INTRAVENOUS | Status: DC

## 2023-10-18 MED ORDER — ASPIRIN 81 MG PO CHEW
81.0000 mg | CHEWABLE_TABLET | Freq: Two times a day (BID) | ORAL | Status: DC
  Administered 2023-10-18 – 2023-10-19 (×2): 81 mg via ORAL
  Filled 2023-10-18 (×2): qty 1

## 2023-10-18 MED ORDER — OXYCODONE HCL 5 MG PO TABS
10.0000 mg | ORAL_TABLET | ORAL | Status: DC | PRN
  Administered 2023-10-18 (×2): 10 mg via ORAL
  Filled 2023-10-18: qty 2

## 2023-10-18 MED ORDER — MENTHOL 3 MG MT LOZG: 1.0000 | LOZENGE | OROMUCOSAL | Status: DC | PRN

## 2023-10-18 MED ORDER — OXYCODONE HCL 5 MG PO TABS
5.0000 mg | ORAL_TABLET | ORAL | Status: DC | PRN
  Administered 2023-10-19: 5 mg via ORAL

## 2023-10-18 MED ORDER — DEXAMETHASONE SODIUM PHOSPHATE 10 MG/ML IJ SOLN
INTRAMUSCULAR | Status: AC
  Filled 2023-10-18: qty 1

## 2023-10-18 MED ORDER — PANTOPRAZOLE SODIUM 40 MG PO TBEC
40.0000 mg | DELAYED_RELEASE_TABLET | Freq: Two times a day (BID) | ORAL | Status: DC
  Administered 2023-10-18 – 2023-10-19 (×2): 40 mg via ORAL
  Filled 2023-10-18 (×2): qty 1

## 2023-10-18 MED ORDER — TRAMADOL HCL 50 MG PO TABS: 50.0000 mg | ORAL_TABLET | ORAL | Status: DC | PRN

## 2023-10-18 MED ORDER — IPRATROPIUM-ALBUTEROL 0.5-2.5 (3) MG/3ML IN SOLN
3.0000 mL | Freq: Once | RESPIRATORY_TRACT | Status: AC
  Administered 2023-10-18: 3 mL via RESPIRATORY_TRACT

## 2023-10-18 MED ORDER — OXYCODONE HCL 5 MG PO TABS
ORAL_TABLET | ORAL | Status: AC
  Filled 2023-10-18: qty 2

## 2023-10-18 MED ORDER — CEFAZOLIN SODIUM-DEXTROSE 2-4 GM/100ML-% IV SOLN
2.0000 g | Freq: Four times a day (QID) | INTRAVENOUS | Status: AC
  Administered 2023-10-18 (×2): 2 g via INTRAVENOUS
  Filled 2023-10-18 (×3): qty 100

## 2023-10-18 MED ORDER — CELECOXIB 200 MG PO CAPS
ORAL_CAPSULE | ORAL | Status: AC
  Filled 2023-10-18: qty 2

## 2023-10-18 MED ORDER — CHLORHEXIDINE GLUCONATE 0.12 % MT SOLN
OROMUCOSAL | Status: AC
Start: 2023-10-18 — End: ?
  Filled 2023-10-18: qty 15

## 2023-10-18 MED ORDER — ONDANSETRON HCL 4 MG/2ML IJ SOLN
INTRAMUSCULAR | Status: DC | PRN
  Administered 2023-10-18: 4 mg via INTRAVENOUS

## 2023-10-18 MED ORDER — MIDAZOLAM HCL 2 MG/2ML IJ SOLN
INTRAMUSCULAR | Status: AC
  Filled 2023-10-18: qty 2

## 2023-10-18 MED ORDER — LACTATED RINGERS IV SOLN: INTRAVENOUS | Status: DC

## 2023-10-18 MED ORDER — LEVOTHYROXINE SODIUM 112 MCG PO TABS
112.0000 ug | ORAL_TABLET | Freq: Every day | ORAL | Status: DC
Start: 2023-10-19 — End: 2023-10-18

## 2023-10-18 MED ORDER — HYDROMORPHONE HCL 1 MG/ML IJ SOLN: 0.5000 mg | INTRAMUSCULAR | Status: DC | PRN

## 2023-10-18 MED ORDER — KETAMINE HCL 50 MG/5ML IJ SOSY
PREFILLED_SYRINGE | INTRAMUSCULAR | Status: AC
Start: 2023-10-18 — End: ?
  Filled 2023-10-18: qty 5

## 2023-10-18 MED ORDER — FERROUS SULFATE 325 (65 FE) MG PO TABS
325.0000 mg | ORAL_TABLET | Freq: Two times a day (BID) | ORAL | Status: DC
  Administered 2023-10-19: 325 mg via ORAL
  Filled 2023-10-18: qty 1

## 2023-10-18 SURGICAL SUPPLY — 67 items
ATTUNE MED DOME PAT 38 KNEE (Knees) IMPLANT
ATTUNE PS FEM RT SZ 5 CEM KNEE (Femur) IMPLANT
ATTUNE PSRP INSR SZ5 5 KNEE (Insert) IMPLANT
BASE TIBIA ATTUNE KNEE SYS SZ6 (Knees) IMPLANT
BATTERY INSTRU NAVIGATION (MISCELLANEOUS) ×4 IMPLANT
BIT DRILL QUICK REL 1/8 2PK SL (BIT) ×1 IMPLANT
BLADE CLIPPER SURG (BLADE) IMPLANT
BLADE SAW 70X12.5 (BLADE) ×1 IMPLANT
BLADE SAW 90X13X1.19 OSCILLAT (BLADE) ×1 IMPLANT
BLADE SAW 90X25X1.19 OSCILLAT (BLADE) ×1 IMPLANT
BONE CEMENT GENTAMICIN (Cement) ×2 IMPLANT
BRUSH SCRUB EZ PLAIN DRY (MISCELLANEOUS) ×1 IMPLANT
CEMENT BONE GENTAMICIN 40 (Cement) IMPLANT
COOLER POLAR GLACIER W/PUMP (MISCELLANEOUS) ×1 IMPLANT
CUFF TRNQT CYL 24X4X16.5-23 (TOURNIQUET CUFF) IMPLANT
CUFF TRNQT CYL 30X4X21-28X (TOURNIQUET CUFF) IMPLANT
DRAPE SHEET LG 3/4 BI-LAMINATE (DRAPES) ×1 IMPLANT
DRSG AQUACEL AG ADV 3.5X14 (GAUZE/BANDAGES/DRESSINGS) ×1 IMPLANT
DRSG MEPILEX SACRM 8.7X9.8 (GAUZE/BANDAGES/DRESSINGS) ×1 IMPLANT
DRSG TEGADERM 4X4.75 (GAUZE/BANDAGES/DRESSINGS) ×1 IMPLANT
DURAPREP 26ML APPLICATOR (WOUND CARE) ×2 IMPLANT
ELECT CAUTERY BLADE 6.4 (BLADE) ×1 IMPLANT
ELECT REM PT RETURN 9FT ADLT (ELECTROSURGICAL) ×1 IMPLANT
ELECTRODE REM PT RTRN 9FT ADLT (ELECTROSURGICAL) ×1 IMPLANT
EVACUATOR 1/8 PVC DRAIN (DRAIN) ×1 IMPLANT
EX-PIN ORTHOLOCK NAV 4X150 (PIN) ×2 IMPLANT
GAUZE XEROFORM 1X8 LF (GAUZE/BANDAGES/DRESSINGS) ×1 IMPLANT
GLOVE BIOGEL M STRL SZ7.5 (GLOVE) ×6 IMPLANT
GLOVE SURG UNDER POLY LF SZ8 (GLOVE) ×2 IMPLANT
GOWN STRL REUS W/ TWL LRG LVL3 (GOWN DISPOSABLE) ×1 IMPLANT
GOWN STRL REUS W/ TWL XL LVL3 (GOWN DISPOSABLE) ×1 IMPLANT
GOWN TOGA ZIPPER T7+ PEEL AWAY (MISCELLANEOUS) ×1 IMPLANT
HOLDER FOLEY CATH W/STRAP (MISCELLANEOUS) ×1 IMPLANT
HOOD PEEL AWAY T7 (MISCELLANEOUS) ×1 IMPLANT
KIT TURNOVER KIT A (KITS) ×1 IMPLANT
KNIFE SCULPS 14X20 (INSTRUMENTS) ×1 IMPLANT
MANIFOLD NEPTUNE II (INSTRUMENTS) ×2 IMPLANT
NDL SPNL 20GX3.5 QUINCKE YW (NEEDLE) ×2 IMPLANT
NEEDLE SPNL 20GX3.5 QUINCKE YW (NEEDLE) ×2 IMPLANT
PACK TOTAL KNEE (MISCELLANEOUS) ×1 IMPLANT
PAD ABD DERMACEA PRESS 5X9 (GAUZE/BANDAGES/DRESSINGS) ×2 IMPLANT
PAD ARMBOARD POSITIONER FOAM (MISCELLANEOUS) ×3 IMPLANT
PAD WRAPON POLAR KNEE (MISCELLANEOUS) ×1 IMPLANT
PENCIL SMOKE EVACUATOR COATED (MISCELLANEOUS) ×1 IMPLANT
PIN DRILL FIX HALF THREAD (BIT) ×2 IMPLANT
PIN FIXATION 1/8DIA X 3INL (PIN) ×1 IMPLANT
PULSAVAC PLUS IRRIG FAN TIP (DISPOSABLE) ×1 IMPLANT
SOL .9 NS 3000ML IRR UROMATIC (IV SOLUTION) ×1 IMPLANT
SOLUTION IRRIG SURGIPHOR (IV SOLUTION) ×1 IMPLANT
SPONGE DRAIN TRACH 4X4 STRL 2S (GAUZE/BANDAGES/DRESSINGS) ×1 IMPLANT
STAPLER SKIN PROX 35W (STAPLE) ×1 IMPLANT
STOCKINETTE IMPERV 14X48 (MISCELLANEOUS) ×1 IMPLANT
STOCKINETTE STRL BIAS CUT 8X4 (MISCELLANEOUS) ×1 IMPLANT
STRAP TIBIA SHORT (MISCELLANEOUS) ×1 IMPLANT
SUCTION TUBE FRAZIER 10FR DISP (SUCTIONS) ×1 IMPLANT
SUT VIC AB 0 CT1 36 (SUTURE) ×1 IMPLANT
SUT VIC AB 1 CT1 36 (SUTURE) ×2 IMPLANT
SUT VIC AB 2-0 CT2 27 (SUTURE) ×1 IMPLANT
SYR 30ML LL (SYRINGE) ×2 IMPLANT
TIBIA ATTUNE KNEE SYS BASE SZ6 (Knees) ×1 IMPLANT
TIP FAN IRRIG PULSAVAC PLUS (DISPOSABLE) ×1 IMPLANT
TOWEL OR 17X26 4PK STRL BLUE (TOWEL DISPOSABLE) ×1 IMPLANT
TOWER CARTRIDGE SMART MIX (DISPOSABLE) ×1 IMPLANT
TRAP FLUID SMOKE EVACUATOR (MISCELLANEOUS) ×1 IMPLANT
TRAY FOLEY MTR SLVR 16FR STAT (SET/KITS/TRAYS/PACK) ×1 IMPLANT
WATER STERILE IRR 1000ML POUR (IV SOLUTION) ×1 IMPLANT
WRAPON POLAR PAD KNEE (MISCELLANEOUS) ×1 IMPLANT

## 2023-10-18 NOTE — Plan of Care (Signed)
 Patient verbalizes understanding of procedure, discharge planning

## 2023-10-18 NOTE — Anesthesia Preprocedure Evaluation (Signed)
 Anesthesia Evaluation  Patient identified by MRN, date of birth, ID band Patient awake    Reviewed: Allergy & Precautions, H&P , NPO status , Patient's Chart, lab work & pertinent test results, reviewed documented beta blocker date and time   Airway Mallampati: III   Neck ROM: full    Dental  (+) Poor Dentition, Teeth Intact   Pulmonary neg pulmonary ROS, Patient abstained from smoking., former smoker   Pulmonary exam normal        Cardiovascular Exercise Tolerance: Poor hypertension, On Medications negative cardio ROS Normal cardiovascular exam Rhythm:regular Rate:Normal     Neuro/Psych  PSYCHIATRIC DISORDERS Anxiety Depression     Neuromuscular disease    GI/Hepatic Neg liver ROS,GERD  Medicated,,  Endo/Other  Hypothyroidism  Class 3 obesity  Renal/GU negative Renal ROS  negative genitourinary   Musculoskeletal   Abdominal   Peds  Hematology  (+) Blood dyscrasia, anemia   Anesthesia Other Findings Past Medical History: No date: Anxiety No date: Arthritis No date: Depression 2010: Diverticulosis of intestine 2010: Esophagitis, reflux No date: GERD (gastroesophageal reflux disease) No date: Hepatic steatosis No date: HLD (hyperlipidemia) No date: HTN (hypertension) No date: Hypothyroidism 01/25/2012: Impaired fasting glucose     Comment:  Needs eye exam.   No date: Long-term use of aspirin therapy No date: Pre-diabetes Past Surgical History: 2009: ABDOMINAL HYSTERECTOMY     Comment:  supracervical  No date: APPENDECTOMY 01/26/2022: BREAST BIOPSY; Right     Comment:  papilloma No date: BREAST CYST ASPIRATION; Right     Comment:  rt fna-neg 06/05/2015: COLONOSCOPY WITH PROPOFOL; N/A     Comment:  Procedure: COLONOSCOPY WITH PROPOFOL;  Surgeon: Midge Minium, MD;  Location: [facility];  Service:               Endoscopy;  Laterality: N/A;  Pt requests early. Pt for                antibiotic pre-procedure  CECAL PROTRUSION BX. / WITH               CLIP LOT # [id number] RESOLUTION 360 EXP               03/03/2016 SIGMOID POLYP 2012: JOINT REPLACEMENT; Right     Comment:  hip, [name] No date: THYROIDECTOMY 05/02/2018: TOTAL HIP ARTHROPLASTY; Left     Comment:  Procedure: TOTAL HIP ARTHROPLASTY;  Surgeon: Donato Heinz, MD;  Location: [facility];  Service: Orthopedics;               Laterality: Left; BMI    Body Mass Index: 42.47 kg/m     Reproductive/Obstetrics negative OB ROS                             Anesthesia Physical Anesthesia Plan  ASA: 3  Anesthesia Plan: Spinal   Post-op Pain Management:    Induction:   PONV Risk Score and Plan: 4 or greater  Airway Management Planned:   Additional Equipment:   Intra-op Plan:   Post-operative Plan:   Informed Consent: I have reviewed the patients History and Physical, chart, labs and discussed the procedure including the risks, benefits and alternatives for the proposed anesthesia with the patient or authorized representative who has indicated his/her understanding and acceptance.  Dental Advisory Given  Plan Discussed with: CRNA  Anesthesia Plan Comments:        Anesthesia Quick Evaluation

## 2023-10-18 NOTE — Transfer of Care (Signed)
 Immediate Anesthesia Transfer of Care Note  Patient: Lisa Valencia  Procedure(s) Performed: ARTHROPLASTY, KNEE, TOTAL, USING IMAGELESS COMPUTER-ASSISTED NAVIGATION (Right: Knee)  Patient Location: PACU  Anesthesia Type:MAC and Spinal  Level of Consciousness: awake and alert   Airway & Oxygen Therapy: Patient Spontanous Breathing and Patient connected to face mask oxygen  Post-op Assessment: Report given to RN and Post -op Vital signs reviewed and unstable, Anesthesiologist notified; SpO2 89-90%. MD at beside. Duoneb ordered.   Post vital signs: Reviewed and unstable  Last Vitals:  Vitals Value Taken Time  BP 106/83 10/18/23 1645  Temp    Pulse 100 10/18/23 1655  Resp 24 10/18/23 1655  SpO2 90 % 10/18/23 1655  Vitals shown include unfiled device data.  Last Pain:  Vitals:   10/18/23 1052  TempSrc: Temporal  PainSc: 10-Worst pain ever      Patients Stated Pain Goal: 0 (10/18/23 1052)  Complications: No notable events documented.

## 2023-10-18 NOTE — Interval H&P Note (Signed)
 History and Physical Interval Note:  10/18/2023 12:05 PM  Lisa Valencia  has presented today for surgery, with the diagnosis of PRIMARY OSTEOARTHRITIS OF RIGHT KNEE..  The various methods of treatment have been discussed with the patient and family. After consideration of risks, benefits and other options for treatment, the patient has consented to  Procedure(s): ARTHROPLASTY, KNEE, TOTAL, USING IMAGELESS COMPUTER-ASSISTED NAVIGATION (Right) as a surgical intervention.  The patient's history has been reviewed, patient examined, no change in status, stable for surgery.  I have reviewed the patient's chart and labs.  Questions were answered to the patient's satisfaction.     Saxton Chain P Advika Mclelland

## 2023-10-18 NOTE — Anesthesia Procedure Notes (Signed)
 Spinal  Patient location during procedure: OR Start time: 10/18/2023 12:40 PM End time: 10/18/2023 12:44 PM Reason for block: surgical anesthesia Staffing Anesthesiologist: Yevette Edwards, MD Resident/CRNA: Otho Perl, CRNA Performed by: Otho Perl, CRNA Authorized by: Yevette Edwards, MD   Preanesthetic Checklist Completed: patient identified, IV checked, site marked, risks and benefits discussed, surgical consent, monitors and equipment checked, pre-op evaluation and timeout performed Spinal Block Patient position: sitting Prep: ChloraPrep Patient monitoring: heart rate, continuous pulse ox, blood pressure and cardiac monitor Approach: midline Location: L4-5 Injection technique: single-shot Needle Needle type: Quincke  Needle gauge: 22 G Needle length: 9 cm Assessment Events: CSF return Additional Notes Negative paresthesia. Negative blood return. Positive free-flowing CSF. Expiration date of kit checked and confirmed. Patient tolerated procedure well, without complications.

## 2023-10-18 NOTE — Op Note (Signed)
 OPERATIVE NOTE  DATE OF SURGERY:  10/18/2023  PATIENT NAME:  STUART GUILLEN   DOB: 07/24/1960  MRN: [id number]  PRE-OPERATIVE DIAGNOSIS: Degenerative arthrosis of the right knee, primary  POST-OPERATIVE DIAGNOSIS:  Same  PROCEDURE:  Right total knee arthroplasty using computer-assisted navigation  SURGEON:  Jena Gauss. M.D.  ASSISTANT:  Gean Birchwood, PA-C (present and scrubbed throughout the case, critical for assistance with exposure, retraction, instrumentation, and closure)  ANESTHESIA: spinal  ESTIMATED BLOOD LOSS: 50 mL  FLUIDS REPLACED: 900 mL of crystalloid  TOURNIQUET TIME: 115 minutes  DRAINS: 2 medium Hemovac drains  SOFT TISSUE RELEASES: Anterior cruciate ligament, posterior cruciate ligament, deep medial collateral ligament, patellofemoral ligament, posterolateral corner, and pie crusting of the IT band  IMPLANTS UTILIZED: DePuy Attune size 5 posterior stabilized femoral component (cemented), size 6 rotating platform tibial component (cemented), 38 mm medialized dome patella (cemented), and a 5 mm stabilized rotating platform polyethylene insert.  INDICATIONS FOR SURGERY: GERRIE CASTIGLIA is a 63 y.o. year old female with a long history of progressive knee pain. X-rays demonstrated severe degenerative changes in tricompartmental fashion. The patient had not seen any significant improvement despite conservative nonsurgical intervention. After discussion of the risks and benefits of surgical intervention, the patient expressed understanding of the risks benefits and agree with plans for total knee arthroplasty.   The risks, benefits, and alternatives were discussed at length including but not limited to the risks of infection, bleeding, nerve injury, stiffness, blood clots, the need for revision surgery, cardiopulmonary complications, among others, and they were willing to proceed.  PROCEDURE IN DETAIL: The patient was brought into the operating room and, after  adequate spinal anesthesia was achieved, a tourniquet was placed on the patient's upper thigh. The patient's knee and leg were cleaned and prepped with alcohol and DuraPrep and draped in the usual sterile fashion. A "timeout" was performed as per usual protocol. The lower extremity was exsanguinated using an Esmarch, and the tourniquet was inflated to 300 mmHg. An anterior longitudinal incision was made followed by a standard mid vastus approach. The deep fibers of the medial collateral ligament were elevated in a subperiosteal fashion off of the medial flare of the tibia so as to maintain a continuous soft tissue sleeve. The patella was subluxed laterally and the patellofemoral ligament was incised. Inspection of the knee demonstrated severe degenerative changes with full-thickness loss of articular cartilage. Osteophytes were debrided using a rongeur. Anterior and posterior cruciate ligaments were excised. Two 4.0 mm Schanz pins were inserted in the femur and into the tibia for attachment of the array of trackers used for computer-assisted navigation. Hip center was identified using a circumduction technique. Distal landmarks were mapped using the computer. The distal femur and proximal tibia were mapped using the computer. The distal femoral cutting guide was positioned using computer-assisted navigation so as to achieve a 5 distal valgus cut. The femur was sized and it was felt that a size 5 femoral component was appropriate. A size 5 femoral cutting guide was positioned and the anterior cut was performed and verified using the computer. This was followed by completion of the posterior and chamfer cuts. Femoral cutting guide for the central box was then positioned in the center box cut was performed.  Attention was then directed to the proximal tibia. Medial and lateral menisci were excised. The extramedullary tibial cutting guide was positioned using computer-assisted navigation so as to achieve a 0  varus-valgus alignment and 3 posterior slope. The cut was  performed and verified using the computer. The proximal tibia was sized and it was felt that a size 6 tibial tray was appropriate. Tibial and femoral trials were inserted followed by insertion of a 5 mm polyethylene insert. The knee was felt to be tight laterally.  The trial components were removed and the knee was brought into full extension and distracted using the Moreland retractors.  The posterolateral corner was carefully released using a combination of electrocautery and Metzenbaum scissors.  The iliotibial band was still felt to the tight so it was elected to piecrust the IT band so this allowed for appropriate lengthening.  Trial components were reinserted followed by placement of a 5 mm polyethylene trial.  This allowed for excellent mediolateral soft tissue balancing both in flexion and in full extension. Finally, the patella was cut and prepared so as to accommodate a 38 mm medialized dome patella. A patella trial was placed and the knee was placed through a range of motion with excellent patellar tracking appreciated. The femoral trial was removed after debridement of posterior osteophytes. The central post-hole for the tibial component was reamed followed by insertion of a keel punch. Tibial trials were then removed. Cut surfaces of bone were irrigated with copious amounts of normal saline using pulsatile lavage and then suctioned dry. Polymethylmethacrylate cement with gentamicin was prepared in the usual fashion using a vacuum mixer. Cement was applied to the cut surface of the proximal tibia as well as along the undersurface of a size 6 rotating platform tibial component. Tibial component was positioned and impacted into place. Excess cement was removed using Personal assistant. Cement was then applied to the cut surfaces of the femur as well as along the posterior flanges of the size 5 femoral component. The femoral component was positioned  and impacted into place. Excess cement was removed using Personal assistant. A 5 mm polyethylene trial was inserted and the knee was brought into full extension with steady axial compression applied. Finally, cement was applied to the backside of a 38 mm medialized dome patella and the patellar component was positioned and patellar clamp applied. Excess cement was removed using Personal assistant. After adequate curing of the cement, the tourniquet was deflated after a total tourniquet time of 115 minutes. Hemostasis was achieved using electrocautery. The knee was irrigated with copious amounts of normal saline using pulsatile lavage followed by 450 ml of Surgiphor and then suctioned dry. 20 mL of 1.3% Exparel and 60 mL of 0.25% Marcaine in 40 mL of normal saline was injected along the posterior capsule, medial and lateral gutters, and along the arthrotomy site. A 5 mm stabilized rotating platform polyethylene insert was inserted and the knee was placed through a range of motion with excellent mediolateral soft tissue balancing appreciated and excellent patellar tracking noted. 2 medium drains were placed in the wound bed and brought out through separate stab incisions. The medial parapatellar portion of the incision was reapproximated using interrupted sutures of #1 Vicryl. Subcutaneous tissue was approximated in layers using first #0 Vicryl followed #2-0 Vicryl. The skin was approximated with skin staples. A sterile dressing was applied.  The patient tolerated the procedure well and was transported to the recovery room in stable condition.    Shuayb Schepers P. Angie Fava., M.D.

## 2023-10-19 ENCOUNTER — Encounter: Payer: Self-pay | Admitting: Orthopedic Surgery

## 2023-10-19 DIAGNOSIS — M1711 Unilateral primary osteoarthritis, right knee: Secondary | ICD-10-CM | POA: Diagnosis not present

## 2023-10-19 DIAGNOSIS — Z79899 Other long term (current) drug therapy: Secondary | ICD-10-CM | POA: Diagnosis not present

## 2023-10-19 DIAGNOSIS — E119 Type 2 diabetes mellitus without complications: Secondary | ICD-10-CM | POA: Diagnosis not present

## 2023-10-19 DIAGNOSIS — Z7982 Long term (current) use of aspirin: Secondary | ICD-10-CM | POA: Diagnosis not present

## 2023-10-19 DIAGNOSIS — F17210 Nicotine dependence, cigarettes, uncomplicated: Secondary | ICD-10-CM | POA: Diagnosis not present

## 2023-10-19 DIAGNOSIS — Z96643 Presence of artificial hip joint, bilateral: Secondary | ICD-10-CM | POA: Diagnosis not present

## 2023-10-19 LAB — GLUCOSE, CAPILLARY
Glucose-Capillary: 116 mg/dL — ABNORMAL HIGH (ref 70–99)
Glucose-Capillary: 109 mg/dL — ABNORMAL HIGH (ref 70–99)

## 2023-10-19 MED ORDER — ASPIRIN EC 81 MG PO TBEC: 81.0000 mg | DELAYED_RELEASE_TABLET | Freq: Every day | ORAL | Status: AC

## 2023-10-19 MED ORDER — TRAMADOL HCL 50 MG PO TABS
50.0000 mg | ORAL_TABLET | ORAL | 0 refills | Status: DC | PRN
Start: 2023-10-19 — End: 2024-01-25

## 2023-10-19 MED ORDER — CELECOXIB 200 MG PO CAPS: 200.0000 mg | ORAL_CAPSULE | Freq: Two times a day (BID) | ORAL | 1 refills | Status: DC

## 2023-10-19 MED ORDER — LEVOTHYROXINE SODIUM 112 MCG PO TABS
ORAL_TABLET | ORAL | Status: AC
  Filled 2023-10-19: qty 1

## 2023-10-19 MED ORDER — ACETAMINOPHEN 10 MG/ML IV SOLN
INTRAVENOUS | Status: AC
  Filled 2023-10-19: qty 100

## 2023-10-19 MED ORDER — OXYCODONE HCL 5 MG PO TABS: 5.0000 mg | ORAL_TABLET | ORAL | 0 refills | Status: DC | PRN

## 2023-10-19 MED ORDER — OXYCODONE HCL 5 MG PO TABS
ORAL_TABLET | ORAL | Status: AC
  Filled 2023-10-19: qty 2

## 2023-10-19 NOTE — Discharge Summary (Signed)
 Physician Discharge Summary  Subjective: 1 Day Post-Op Procedure(s) (LRB): ARTHROPLASTY, KNEE, TOTAL, USING IMAGELESS COMPUTER-ASSISTED NAVIGATION (Right) Patient reports pain as mild.   Patient seen in rounds with Dr. Ernest Pine. Patient is well, and has had no acute complaints or problems. Denies any CP, SOB, N/V, fevers or chills Patient is ready to go home  Physician Discharge Summary  Patient ID: Lisa Valencia MRN: [id number] DOB/AGE: 1960/09/28 63 y.o.  Admit date: 10/18/2023 Discharge date: 10/19/2023  Admission Diagnoses:  Discharge Diagnoses:  Principal Problem:   History of total knee arthroplasty, right   Discharged Condition: good  Hospital Course: Patient presented to the hospital on 10/18/2023 for an elective right total knee arthroplasty. Patient was given 1g of TXA and 2g of Ancef prior to the procedure. she tolerated the procedure well without any complications. See procedural note below for details. Postoperatively, the patient did very well. she was able to pass PT protocols on post-op day one without any issues. JP drain had slightly increased output, was held during the AM and was removed in the afternoon without any difficulty and was intact. she was able to void her bladder without any difficulty. Physical exam was unremarkable. she denies any SOB, CP, N/V, fevers or chills. Vital signs are stable. Patient is stable to discharge home.  PROCEDURE:  Right total knee arthroplasty using computer-assisted navigation   SURGEON:  Jena Gauss. M.D.   ASSISTANT:  Gean Birchwood, PA-C (present and scrubbed throughout the case, critical for assistance with exposure, retraction, instrumentation, and closure)   ANESTHESIA: spinal   ESTIMATED BLOOD LOSS: 50 mL   FLUIDS REPLACED: 900 mL of crystalloid   TOURNIQUET TIME: 115 minutes   DRAINS: 2 medium Hemovac drains   SOFT TISSUE RELEASES: Anterior cruciate ligament, posterior cruciate ligament, deep medial collateral  ligament, patellofemoral ligament, posterolateral corner, and pie crusting of the IT band   IMPLANTS UTILIZED: DePuy Attune size 5 posterior stabilized femoral component (cemented), size 6 rotating platform tibial component (cemented), 38 mm medialized dome patella (cemented), and a 5 mm stabilized rotating platform polyethylene insert.    Treatments: none  Discharge Exam: Blood pressure (!) 104/54, pulse 73, temperature 98.5 F (36.9 C), temperature source Oral, resp. rate 16, height 5\' 10"  (1.778 m), weight 134.3 kg, SpO2 92%.   Disposition: home   Allergies as of 10/19/2023       Reactions   Sulfa Antibiotics Swelling, Other (See Comments)   Angioedema THROAT AND TONGUE HAD SWELLING   Chantix [varenicline] Nausea Only   Fexofenadine Other (See Comments)   Codeine Sulfate Nausea Only   Fenofibrate Other (See Comments)   Muscle aches   Pseudoephedrine Hcl Palpitations   Wellbutrin [bupropion] Hives        Medication List     TAKE these medications    acetaminophen 500 MG tablet Commonly known as: TYLENOL Take 1,000 mg by mouth every 6 (six) hours as needed for moderate pain (pain score 4-6).   aspirin EC 81 MG tablet Take 1 tablet (81 mg total) by mouth daily.   celecoxib 200 MG capsule Commonly known as: CELEBREX Take 1 capsule (200 mg total) by mouth 2 (two) times daily.   cetirizine 10 MG tablet Commonly known as: ZYRTEC Take 10 mg by mouth daily.   cholecalciferol 25 MCG (1000 UNIT) tablet Commonly known as: VITAMIN D3 Take 2,000 Units by mouth once a week. Friday   cyanocobalamin 500 MCG tablet Commonly known as: VITAMIN B12 Take 500 mcg by mouth  daily.   fexofenadine-pseudoephedrine 180-240 MG 24 hr tablet Commonly known as: ALLEGRA-D 24 Take 1 tablet by mouth at bedtime.   Fish Oil 1000 MG Caps Take 1,000 mg by mouth daily.   folic acid 1 MG tablet Commonly known as: FOLVITE Take 1 tablet (1 mg total) by mouth daily.   levothyroxine 112 MCG  tablet Commonly known as: SYNTHROID Take 1 tablet (112 mcg total) by mouth daily. What changed:  when to take this additional instructions   methocarbamol 500 MG tablet Commonly known as: ROBAXIN Take 500 mg by mouth every 6 (six) hours as needed for muscle spasms.   omeprazole 20 MG capsule Commonly known as: PRILOSEC TAKE ONE CAPSULE BY MOUTH TWICE DAILY BEFORE A MEAL   oxyCODONE 5 MG immediate release tablet Commonly known as: Oxy IR/ROXICODONE Take 1 tablet (5 mg total) by mouth every 4 (four) hours as needed for moderate pain (pain score 4-6) (pain score 4-6).   simvastatin 20 MG tablet Commonly known as: ZOCOR Take 1 tablet (20 mg total) by mouth at bedtime.   spironolactone 50 MG tablet Commonly known as: ALDACTONE TAKE 1 TABLET BY MOUTH DAILY. GENERIC EQUIVALENT FOR ALDACTONE   telmisartan 80 MG tablet Commonly known as: MICARDIS Take 1 tablet (80 mg total) by mouth at bedtime.   traMADol 50 MG tablet Commonly known as: ULTRAM Take 1-2 tablets (50-100 mg total) by mouth every 4 (four) hours as needed for moderate pain (pain score 4-6).   vitamin E 200 UNIT capsule Take 200 Units by mouth daily.               Durable Medical Equipment  (From admission, onward)           Start     Ordered   10/18/23 1643  DME Walker rolling  Once       Question:  Patient needs a walker to treat with the following condition  Answer:  Total knee replacement status   10/18/23 1642   10/18/23 1643  DME Bedside commode  Once       Comments: Patient is not able to walk the distance required to go the bathroom, or he/she is unable to safely negotiate stairs required to access the bathroom.  A 3in1 BSC will alleviate this problem  Question:  Patient needs a bedside commode to treat with the following condition  Answer:  Total knee replacement status   10/18/23 1642            Follow-up Information     Rayburn Go, PA-C Follow up on 11/02/2023.   Specialty:  Orthopedic Surgery Why: at 1:30pm Contact information: [address] [place] [address] [phone]         Donato Heinz, MD Follow up on 11/30/2023.   Specialty: Orthopedic Surgery Why: at 9:45am Contact information: [address] [facility] [place] [address] [phone]                 Signed: Gean Birchwood 10/19/2023, 8:28 AM   Objective: Vital signs in last 24 hours: Temp:  [97.2 F (36.2 C)-98.8 F (37.1 C)] 98.5 F (36.9 C) (04/03 0600) Pulse Rate:  [73-120] 73 (04/03 0600) Resp:  [14-16] 16 (04/03 0600) BP: (98-164)/(54-85) 104/54 (04/03 0600) SpO2:  [87 %-97 %] 92 % (04/03 0619) Weight:  [134.3 kg] 134.3 kg (04/02 1052)  Intake/Output from previous day:  Intake/Output Summary (Last 24 hours) at 10/19/2023 0828 Last data filed at 10/19/2023 0600 Gross  per 24 hour  Intake 1981.98 ml  Output 931 ml  Net 1050.98 ml    Intake/Output this shift: No intake/output data recorded.  Labs: No results for input(s): "HGB" in the last 72 hours. No results for input(s): "WBC", "RBC", "HCT", "PLT" in the last 72 hours. No results for input(s): "NA", "K", "CL", "CO2", "BUN", "CREATININE", "GLUCOSE", "CALCIUM" in the last 72 hours. No results for input(s): "LABPT", "INR" in the last 72 hours.  EXAM: General - Patient is Alert, Appropriate, and Oriented Extremity - Neurologically intact Neurovascular intact Sensation intact distally Intact pulses distally Dorsiflexion/Plantar flexion intact No cellulitis present Compartment soft Dressing - dressing C/D/I and no drainage Motor Function - intact, moving foot and toes well on exam. JP Drain removed without any issue, intact  Assessment/Plan: 1 Day Post-Op Procedure(s) (LRB): ARTHROPLASTY, KNEE, TOTAL, USING IMAGELESS COMPUTER-ASSISTED NAVIGATION (Right) Procedure(s) (LRB): ARTHROPLASTY, KNEE, TOTAL, USING IMAGELESS COMPUTER-ASSISTED NAVIGATION (Right) Past Medical History:   Diagnosis Date   Anxiety    Arthritis    Depression    Diverticulosis of intestine 2010   Esophagitis, reflux 2010   GERD (gastroesophageal reflux disease)    Hepatic steatosis    HLD (hyperlipidemia)    HTN (hypertension)    Hypothyroidism    Impaired fasting glucose 01/25/2012   Needs eye exam.     Long-term use of aspirin therapy    Pre-diabetes    Principal Problem:   History of total knee arthroplasty, right  Estimated body mass index is 42.47 kg/m as calculated from the following:   Height as of this encounter: 5\' 10"  (1.778 m).   Weight as of this encounter: 134.3 kg.  Patient will continue to work with physical therapy.   Discussed with the patient continuing to utilize Polar Care   Patient will use bone foam in 20-30 minute intervals   Patient will wear TED hose bilaterally to help prevent DVT and clot formation   Discussed the Aquacel bandage.  This bandage will stay in place 7 days postoperatively.  Can be replaced with honeycomb bandages that will be sent home with the patient   Discussed sending the patient home with tramadol and oxycodone for as needed pain management.  Patient will also be sent home with Celebrex to help with swelling and inflammation.  Patient will take an 81 mg aspirin twice daily for DVT prophylaxis   JP drain removed without difficulty, intact   Weight-Bearing as tolerated to right leg   Patient will follow-up with [facility] orthopedics in 2 weeks for staple removal and reevaluation  Diet - Regular diet Follow up - in 2 weeks Activity - WBAT Disposition - Home Condition Upon Discharge - Good DVT Prophylaxis - Aspirin and TED hose  Danise Edge, PA-C Orthopaedic Surgery 10/19/2023, 8:28 AM

## 2023-10-19 NOTE — Progress Notes (Signed)
 Occupational Therapy Evaluation Patient Details Name: Lisa Valencia MRN: [id number] DOB: 1960/08/23 Today's Date: 10/19/2023   History of Present Illness   63 y/o female s/p R TKA on 10/18/23. PMH: hx B THA, T2DM, HTN, anxiety, HTN     Clinical Impressions Pt seen for OT evaluation this date, POD#1 from above surgery. Pt was independent in all ADLs prior to surgery, however occasionally using DME/AE for MOBILITY/ADL due to R knee pain. Pt is eager to return to PLOF with less pain and improved safety and independence. Pt currently requires MAXA assist for LB dressing while in seated position due to pain and limited AROM of R knee, pt reports her husband will assist in all LB dressing needs and she typically uses a reacher when she is experiencing pain. Pt amb to BR with RW + CGA, Pt reported no increase levels of pain required no physical assistance for STS from multiple surfaces. Noted posterior LOB during handwashing when pt turning to reach out of BOS. Pt able to self correct with RW and states it happens sometimes but she is usually able to correct balance. Pt returned to recliner with husband entering room mid session. Pt instructed in polar care mgt, falls prevention strategies, home/routines modifications, DME/AE for LB bathing and dressing tasks, and compression stocking mgt. Post op drain/dressing intact pre/post session. Handout provided and all OT questions answered. Do not currently anticipate any OT needs following this hospitalization.    RN in room to assess BP. (No report of symptoms during session) Sitting BP:87/53 (MAP 69), HR 78bpm Standing BP: 118/89(MAP 97), HR 122bpm    If plan is discharge home, recommend the following:   A little help with bathing/dressing/bathroom;A little help with walking and/or transfers;Help with stairs or ramp for entrance     Functional Status Assessment   Patient has not had a recent decline in their functional status     Equipment  Recommendations   None recommended by OT     Recommendations for Other Services         Precautions/Restrictions   Precautions Precautions: Fall;Knee Precaution Booklet Issued: Yes (comment) Restrictions Weight Bearing Restrictions Per Provider Order: Yes RLE Weight Bearing Per Provider Order: Weight bearing as tolerated     Mobility Bed Mobility               General bed mobility comments: NT pt sitting in recliner pre/post session    Transfers Overall transfer level: Needs assistance Equipment used: Rolling walker (2 wheels) Transfers: Sit to/from Stand Sit to Stand: Supervision           General transfer comment: No physical assistance during STS from multiple surfaces      Balance Overall balance assessment: Needs assistance Sitting-balance support: No upper extremity supported, Feet supported Sitting balance-Leahy Scale: Good     Standing balance support: Single extremity supported, During functional activity, Reliant on assistive device for balance Standing balance-Leahy Scale: Fair Standing balance comment: Noted posterior LOB during handwashing when pt turning to reach out of BOS. Pt able to self correct and states it happens sometimes but she is usually able to correct balance.                           ADL either performed or assessed with clinical judgement   ADL Overall ADL's : Needs assistance/impaired Eating/Feeding: Independent;Sitting   Grooming: Wash/dry hands;Standing;Contact guard assist (Sink level)  Lower Body Dressing: Maximal assistance;Sit to/from stand (Don socks, limited ROM due to post of dressing, pt reports her husband will assist her in all dressing task at d/c.)   Toilet Transfer: Contact guard assist;Rolling walker (2 wheels);Comfort height toilet   Toileting- Clothing Manipulation and Hygiene: Independent;Sitting/lateral lean       Functional mobility during ADLs: Contact guard  assist;Rolling walker (2 wheels) General ADL Comments: Pt completed pericare indep, hand washing at sink CGA     Vision Baseline Vision/History: 1 Wears glasses                         Pertinent Vitals/Pain Pain Assessment Pain Assessment: 0-10 Pain Score: 2  Pain Location: R knee Pain Descriptors / Indicators: Sore Pain Intervention(s): Limited activity within patient's tolerance     Extremity/Trunk Assessment Upper Extremity Assessment Upper Extremity Assessment: Overall WFL for tasks assessed   Lower Extremity Assessment Lower Extremity Assessment: RLE deficits/detail RLE Deficits / Details: Deficits consistent with post op pain/weakness.   Cervical / Trunk Assessment Cervical / Trunk Assessment: Normal   Communication Communication Communication: No apparent difficulties   Cognition Arousal: Alert Behavior During Therapy: WFL for tasks assessed/performed               OT - Cognition Comments: A/O x4 throughout                 Following commands: Intact       Cueing  General Comments   Cueing Techniques: Verbal cues  Post op dressing/drain in tact pre/post session   Exercises Exercises: Other exercises Other Exercises Other Exercises: Edu: Role of OT, d/c planning, safe ADL completion, review of polar care, LB dressing techniques   Shoulder Instructions      Home Living Family/patient expects to be discharged to:: Private residence Living Arrangements: Spouse/significant other Available Help at Discharge: Family;Available 24 hours/day Type of Home: House Home Access: Stairs to enter [employer] of Steps: 2 Entrance Stairs-Rails: Right;Left;Can reach both Home Layout: One level     Bathroom Shower/Tub: Producer, television/film/video: Handicapped height Bathroom Accessibility: Yes How Accessible: Accessible via walker Home Equipment: Agricultural consultant (2 wheels);BSC/3in1          Prior Functioning/Environment  Prior Level of Function : Independent/Modified Independent;Driving                      OT Goals(Current goals can be found in the care plan section)   Acute Rehab OT Goals Patient Stated Goal: to return home OT Goal Formulation: With patient/family Time For Goal Achievement: 11/02/23 Potential to Achieve Goals: Good   AM-PAC OT "6 Clicks" Daily Activity     Outcome Measure Help from another person eating meals?: None Help from another person taking care of personal grooming?: A Little Help from another person toileting, which includes using toliet, bedpan, or urinal?: None Help from another person bathing (including washing, rinsing, drying)?: A Little Help from another person to put on and taking off regular upper body clothing?: None Help from another person to put on and taking off regular lower body clothing?: A Little 6 Click Score: 21   End of Session Equipment Utilized During Treatment: Gait belt;Rolling walker (2 wheels) Nurse Communication: Mobility status;Other (comment) (RN in room to assess BP)  Activity Tolerance: Patient tolerated treatment well Patient left: in chair;with chair alarm set;with family/visitor present  Time: 1610-9604 OT Time Calculation (min): 26 min Charges:  OT General Charges $OT Visit: 1 Visit OT Evaluation $OT Eval Low Complexity: 1 Low OT Treatments $Self Care/Home Management : 8-22 mins  Glenard Haring M.S. OTR/L  10/19/23, 11:01 AM

## 2023-10-19 NOTE — Progress Notes (Signed)
 Pts BP 93/46, pt asymptomatic.  MD [name] aware, no new orders received

## 2023-10-19 NOTE — Plan of Care (Signed)
   Problem: Activity: Goal: Ability to avoid complications of mobility impairment will improve Outcome: Progressing   Problem: Pain Management: Goal: Pain level will decrease with appropriate interventions Outcome: Progressing   Problem: Skin Integrity: Goal: Will show signs of wound healing Outcome: Progressing

## 2023-10-19 NOTE — Progress Notes (Signed)
 DISCHARGE NOTE:  Pt given discharge instructions, scripts and 2 honeycomb dressings. TED hose on both legs. Pt wheeled to car by staff, family providing transportation.

## 2023-10-19 NOTE — Evaluation (Signed)
 Physical Therapy Evaluation Patient Details Name: Lisa Valencia MRN: [id number] DOB: [birth year] Today's Date: 10/19/2023  History of Present Illness  63 y/o female s/p R TKA on 10/18/23. PMH: hx B THA, T2DM, HTN, anxiety, HTN  Clinical Impression  Patient admitted following above procedure. PTA, patient lives with husband who is able to assist her. Typically, patient is independent with no AD. Patient presents with decreased R knee ROM, weakness, impaired balance, and decreased activity tolerance. Able to achieve ~65 degrees of R knee flexion, however limited by ACE wrap and polar care. Patient is currently at supervision level for sit stand, extended hallway ambulation with RW, and stair negotiation with B handrails. Instructed patient on HEP and provided handout. Patient will benefit from skilled PT services during acute stay to address listed deficits. Patient will benefit from ongoing therapy at discharge to maximize functional independence and safety.       If plan is discharge home, recommend the following: Assistance with cooking/housework;Assist for transportation;Help with stairs or ramp for entrance   Can travel by private vehicle        Equipment Recommendations None recommended by PT  Recommendations for Other Services       Functional Status Assessment Patient has had a recent decline in their functional status and demonstrates the ability to make significant improvements in function in a reasonable and predictable amount of time.     Precautions / Restrictions Precautions Precautions: Fall;Knee Precaution Booklet Issued: Yes (comment) Restrictions Weight Bearing Restrictions Per Provider Order: Yes RLE Weight Bearing Per Provider Order: Weight bearing as tolerated      Mobility  Bed Mobility               General bed mobility comments: sitting in recliner on arrival    Transfers Overall transfer level: Needs assistance Equipment used: Rolling Naoko Diperna (2  wheels) Transfers: Sit to/from Stand Sit to Stand: Supervision                Ambulation/Gait Ambulation/Gait assistance: Supervision Gait Distance (Feet): 300 Feet Assistive device: Rolling Manley Fason (2 wheels) Gait Pattern/deviations: Step-through pattern, Decreased stride length Gait velocity: decreased     General Gait Details: cues for heel strike with good follow through  Stairs Stairs: Yes Stairs assistance: Supervision Stair Management: Two rails, Step to pattern, Forwards Number of Stairs: 4 General stair comments: good recall of stair negotiation techinque from previous THAs  Wheelchair Mobility     Tilt Bed    Modified Rankin (Stroke Patients Only)       Balance Overall balance assessment: Mild deficits observed, not formally tested                                           Pertinent Vitals/Pain Pain Assessment Pain Assessment: 0-10 Pain Score: 4  Pain Location: R knee Pain Descriptors / Indicators: Sore Pain Intervention(s): Limited activity within patient's tolerance, Monitored during session, Repositioned    Home Living Family/patient expects to be discharged to:: Private residence Living Arrangements: Spouse/significant other Available Help at Discharge: Family;Available 24 hours/day Type of Home: House Home Access: Stairs to enter Entrance Stairs-Rails: Right;Left;Can reach both Entrance Stairs-Number of Steps: 2   Home Layout: One level Home Equipment: Agricultural consultant (2 wheels);BSC/3in1      Prior Function Prior Level of Function : Independent/Modified Independent;Driving  Extremity/Trunk Assessment   Upper Extremity Assessment Upper Extremity Assessment: Overall WFL for tasks assessed    Lower Extremity Assessment Lower Extremity Assessment: RLE deficits/detail RLE Deficits / Details: deficits consistent with post op pain and weakness. R knee flexion: ~65 degrees but limited due to  ACE wrap and polar care       Communication   Communication Communication: No apparent difficulties    Cognition Arousal: Alert Behavior During Therapy: WFL for tasks assessed/performed   PT - Cognitive impairments: No apparent impairments                         Following commands: Intact       Cueing       General Comments      Exercises Other Exercises Other Exercises: reviewed HEP with patient   Assessment/Plan    PT Assessment Patient needs continued PT services  PT Problem List Decreased strength;Decreased activity tolerance;Decreased range of motion;Decreased balance;Decreased mobility;Decreased knowledge of precautions;Pain       PT Treatment Interventions DME instruction;Gait training;Functional mobility training;Stair training;Therapeutic activities;Therapeutic exercise;Balance training;Patient/family education    PT Goals (Current goals can be found in the Care Plan section)  Acute Rehab PT Goals Patient Stated Goal: to go home no matter what PT Goal Formulation: With patient Time For Goal Achievement: 11/02/23 Potential to Achieve Goals: Good    Frequency BID     Co-evaluation               AM-PAC PT "6 Clicks" Mobility  Outcome Measure Help needed turning from your back to your side while in a flat bed without using bedrails?: None Help needed moving from lying on your back to sitting on the side of a flat bed without using bedrails?: A Little Help needed moving to and from a bed to a chair (including a wheelchair)?: A Little Help needed standing up from a chair using your arms (e.g., wheelchair or bedside chair)?: A Little Help needed to walk in hospital room?: A Little Help needed climbing 3-5 steps with a railing? : A Little 6 Click Score: 19    End of Session   Activity Tolerance: Patient tolerated treatment well Patient left: in chair;with call bell/phone within reach Nurse Communication: Mobility status PT Visit  Diagnosis: Unsteadiness on feet (R26.81);Muscle weakness (generalized) (M62.81);Other abnormalities of gait and mobility (R26.89)    Time: 1610-9604 PT Time Calculation (min) (ACUTE ONLY): 23 min   Charges:   PT Evaluation $PT Eval Moderate Complexity: 1 Mod PT Treatments $Therapeutic Exercise: 8-22 mins PT General Charges $$ ACUTE PT VISIT: 1 Visit         Maylon Peppers, PT, DPT Physical Therapist - [facility]   Tequia Wolman A Zenobia Kuennen 10/19/2023, 8:30 AM

## 2023-10-19 NOTE — Progress Notes (Signed)
 Subjective: 1 Day Post-Op Procedure(s) (LRB): ARTHROPLASTY, KNEE, TOTAL, USING IMAGELESS COMPUTER-ASSISTED NAVIGATION (Right) Patient reports pain as mild.   Patient seen in rounds with Dr. Ernest Pine. Patient is well, and has had no acute complaints or problems. Denies any CP, SOB, N/V, fevers or chills We will start therapy today.  Plan is to go Home after hospital stay.  Objective: Vital signs in last 24 hours: Temp:  [97.2 F (36.2 C)-98.8 F (37.1 C)] 98.5 F (36.9 C) (04/03 0600) Pulse Rate:  [73-120] 73 (04/03 0600) Resp:  [14-16] 16 (04/03 0600) BP: (98-164)/(54-85) 104/54 (04/03 0600) SpO2:  [87 %-97 %] 92 % (04/03 0619) Weight:  [134.3 kg] 134.3 kg (04/02 1052)  Intake/Output from previous day:  Intake/Output Summary (Last 24 hours) at 10/19/2023 0822 Last data filed at 10/19/2023 0600 Gross per 24 hour  Intake 1981.98 ml  Output 931 ml  Net 1050.98 ml    Intake/Output this shift: No intake/output data recorded.  Labs: No results for input(s): "HGB" in the last 72 hours. No results for input(s): "WBC", "RBC", "HCT", "PLT" in the last 72 hours. No results for input(s): "NA", "K", "CL", "CO2", "BUN", "CREATININE", "GLUCOSE", "CALCIUM" in the last 72 hours. No results for input(s): "LABPT", "INR" in the last 72 hours.  EXAM General - Patient is Alert, Appropriate, and Oriented Extremity - Neurologically intact Neurovascular intact Sensation intact distally Intact pulses distally Dorsiflexion/Plantar flexion intact No cellulitis present Compartment soft Dressing - dressing C/D/I and no drainage Motor Function - intact, moving foot and toes well on exam. JP Drain remains in place, will look to pull later today  Past Medical History:  Diagnosis Date   Anxiety    Arthritis    Depression    Diverticulosis of intestine 2010   Esophagitis, reflux 2010   GERD (gastroesophageal reflux disease)    Hepatic steatosis    HLD (hyperlipidemia)    HTN (hypertension)     Hypothyroidism    Impaired fasting glucose 01/25/2012   Needs eye exam.     Long-term use of aspirin therapy    Pre-diabetes     Assessment/Plan: 1 Day Post-Op Procedure(s) (LRB): ARTHROPLASTY, KNEE, TOTAL, USING IMAGELESS COMPUTER-ASSISTED NAVIGATION (Right) Principal Problem:   History of total knee arthroplasty, right  Estimated body mass index is 42.47 kg/m as calculated from the following:   Height as of this encounter: 5\' 10"  (1.778 m).   Weight as of this encounter: 134.3 kg. Advance diet Up with therapy  Patient will continue to work with physical therapy to pass postoperative PT protocols, ROM and strengthening  Discussed with the patient continuing to utilize Polar Care  Patient will use bone foam in 20-30 minute intervals  Patient will wear TED hose bilaterally to help prevent DVT and clot formation  Discussed the Aquacel bandage.  This bandage will stay in place 7 days postoperatively.  Can be replaced with honeycomb bandages that will be sent home with the patient  Discussed sending the patient home with tramadol and oxycodone for as needed pain management.  Patient will also be sent home with Celebrex to help with swelling and inflammation.  Patient will take an 81 mg aspirin twice daily for DVT prophylaxis  JP drain removed without difficulty, intact  Weight-Bearing as tolerated to right leg  Patient will follow-up with [facility]ics in 2 weeks for staple removal and reevaluation  Rayburn Go, PA-C [facility] 10/19/2023, 8:22 AM

## 2023-10-19 NOTE — Anesthesia Postprocedure Evaluation (Signed)
 Anesthesia Post Note  Patient: SHYNIECE SCRIPTER  Procedure(s) Performed: ARTHROPLASTY, KNEE, TOTAL, USING IMAGELESS COMPUTER-ASSISTED NAVIGATION (Right: Knee)  Patient location during evaluation: Nursing Unit Anesthesia Type: Spinal Level of consciousness: oriented and awake and alert Pain management: pain level controlled Vital Signs Assessment: post-procedure vital signs reviewed and stable Respiratory status: spontaneous breathing and respiratory function stable Cardiovascular status: blood pressure returned to baseline and stable Postop Assessment: no headache, no backache, no apparent nausea or vomiting and patient able to bend at knees Anesthetic complications: no   No notable events documented.   Last Vitals:  Vitals:   10/19/23 0618 10/19/23 0619  BP:    Pulse:    Resp:    Temp:    SpO2: (!) 87% 92%    Last Pain:  Vitals:   10/19/23 0700  TempSrc:   PainSc: 1                  Starling Manns

## 2023-10-19 NOTE — Plan of Care (Signed)
   Problem: Activity: Goal: Ability to avoid complications of mobility impairment will improve Outcome: Progressing   Problem: Clinical Measurements: Goal: Postoperative complications will be avoided or minimized Outcome: Progressing   Problem: Pain Management: Goal: Pain level will decrease with appropriate interventions Outcome: Progressing

## 2023-10-19 NOTE — Final Progress Note (Signed)
 Patient is not able to walk the distance required to go the bathroom, or he/she is unable to safely negotiate stairs required to access the bathroom.  A 3in1 BSC will alleviate this problem   Amenda Duclos P. Angie Fava M.D.

## 2023-10-20 DIAGNOSIS — E669 Obesity, unspecified: Secondary | ICD-10-CM | POA: Diagnosis not present

## 2023-10-20 DIAGNOSIS — F419 Anxiety disorder, unspecified: Secondary | ICD-10-CM | POA: Diagnosis not present

## 2023-10-20 DIAGNOSIS — Z96651 Presence of right artificial knee joint: Secondary | ICD-10-CM | POA: Diagnosis not present

## 2023-10-20 DIAGNOSIS — M5126 Other intervertebral disc displacement, lumbar region: Secondary | ICD-10-CM | POA: Diagnosis not present

## 2023-10-20 DIAGNOSIS — E041 Nontoxic single thyroid nodule: Secondary | ICD-10-CM | POA: Diagnosis not present

## 2023-10-20 DIAGNOSIS — K219 Gastro-esophageal reflux disease without esophagitis: Secondary | ICD-10-CM | POA: Diagnosis not present

## 2023-10-20 DIAGNOSIS — E039 Hypothyroidism, unspecified: Secondary | ICD-10-CM | POA: Diagnosis not present

## 2023-10-20 DIAGNOSIS — E119 Type 2 diabetes mellitus without complications: Secondary | ICD-10-CM | POA: Diagnosis not present

## 2023-10-20 DIAGNOSIS — E785 Hyperlipidemia, unspecified: Secondary | ICD-10-CM | POA: Diagnosis not present

## 2023-10-20 DIAGNOSIS — Z7982 Long term (current) use of aspirin: Secondary | ICD-10-CM | POA: Diagnosis not present

## 2023-10-20 DIAGNOSIS — Z6841 Body Mass Index (BMI) 40.0 and over, adult: Secondary | ICD-10-CM | POA: Diagnosis not present

## 2023-10-20 DIAGNOSIS — G4733 Obstructive sleep apnea (adult) (pediatric): Secondary | ICD-10-CM | POA: Diagnosis not present

## 2023-10-20 DIAGNOSIS — Z791 Long term (current) use of non-steroidal anti-inflammatories (NSAID): Secondary | ICD-10-CM | POA: Diagnosis not present

## 2023-10-20 DIAGNOSIS — F17210 Nicotine dependence, cigarettes, uncomplicated: Secondary | ICD-10-CM | POA: Diagnosis not present

## 2023-10-20 DIAGNOSIS — K760 Fatty (change of) liver, not elsewhere classified: Secondary | ICD-10-CM | POA: Diagnosis not present

## 2023-10-20 DIAGNOSIS — K5790 Diverticulosis of intestine, part unspecified, without perforation or abscess without bleeding: Secondary | ICD-10-CM | POA: Diagnosis not present

## 2023-10-20 DIAGNOSIS — Z471 Aftercare following joint replacement surgery: Secondary | ICD-10-CM | POA: Diagnosis not present

## 2023-10-20 DIAGNOSIS — F32A Depression, unspecified: Secondary | ICD-10-CM | POA: Diagnosis not present

## 2023-10-20 DIAGNOSIS — F1090 Alcohol use, unspecified, uncomplicated: Secondary | ICD-10-CM | POA: Diagnosis not present

## 2023-10-20 DIAGNOSIS — Z90710 Acquired absence of both cervix and uterus: Secondary | ICD-10-CM | POA: Diagnosis not present

## 2023-10-20 DIAGNOSIS — I10 Essential (primary) hypertension: Secondary | ICD-10-CM | POA: Diagnosis not present

## 2023-10-20 DIAGNOSIS — Z96643 Presence of artificial hip joint, bilateral: Secondary | ICD-10-CM | POA: Diagnosis not present

## 2023-10-22 DIAGNOSIS — E119 Type 2 diabetes mellitus without complications: Secondary | ICD-10-CM | POA: Diagnosis not present

## 2023-10-22 DIAGNOSIS — K5790 Diverticulosis of intestine, part unspecified, without perforation or abscess without bleeding: Secondary | ICD-10-CM | POA: Diagnosis not present

## 2023-10-22 DIAGNOSIS — G4733 Obstructive sleep apnea (adult) (pediatric): Secondary | ICD-10-CM | POA: Diagnosis not present

## 2023-10-22 DIAGNOSIS — E669 Obesity, unspecified: Secondary | ICD-10-CM | POA: Diagnosis not present

## 2023-10-22 DIAGNOSIS — F17210 Nicotine dependence, cigarettes, uncomplicated: Secondary | ICD-10-CM | POA: Diagnosis not present

## 2023-10-22 DIAGNOSIS — F1090 Alcohol use, unspecified, uncomplicated: Secondary | ICD-10-CM | POA: Diagnosis not present

## 2023-10-22 DIAGNOSIS — Z96651 Presence of right artificial knee joint: Secondary | ICD-10-CM | POA: Diagnosis not present

## 2023-10-22 DIAGNOSIS — K760 Fatty (change of) liver, not elsewhere classified: Secondary | ICD-10-CM | POA: Diagnosis not present

## 2023-10-22 DIAGNOSIS — E039 Hypothyroidism, unspecified: Secondary | ICD-10-CM | POA: Diagnosis not present

## 2023-10-22 DIAGNOSIS — M5126 Other intervertebral disc displacement, lumbar region: Secondary | ICD-10-CM | POA: Diagnosis not present

## 2023-10-22 DIAGNOSIS — Z471 Aftercare following joint replacement surgery: Secondary | ICD-10-CM | POA: Diagnosis not present

## 2023-10-22 DIAGNOSIS — K219 Gastro-esophageal reflux disease without esophagitis: Secondary | ICD-10-CM | POA: Diagnosis not present

## 2023-10-22 DIAGNOSIS — F32A Depression, unspecified: Secondary | ICD-10-CM | POA: Diagnosis not present

## 2023-10-22 DIAGNOSIS — Z6841 Body Mass Index (BMI) 40.0 and over, adult: Secondary | ICD-10-CM | POA: Diagnosis not present

## 2023-10-22 DIAGNOSIS — F419 Anxiety disorder, unspecified: Secondary | ICD-10-CM | POA: Diagnosis not present

## 2023-10-22 DIAGNOSIS — I10 Essential (primary) hypertension: Secondary | ICD-10-CM | POA: Diagnosis not present

## 2023-10-24 ENCOUNTER — Other Ambulatory Visit: Payer: Self-pay | Admitting: Internal Medicine

## 2023-10-24 DIAGNOSIS — Z471 Aftercare following joint replacement surgery: Secondary | ICD-10-CM | POA: Diagnosis not present

## 2023-10-24 DIAGNOSIS — G4733 Obstructive sleep apnea (adult) (pediatric): Secondary | ICD-10-CM | POA: Diagnosis not present

## 2023-10-24 DIAGNOSIS — F17210 Nicotine dependence, cigarettes, uncomplicated: Secondary | ICD-10-CM | POA: Diagnosis not present

## 2023-10-24 DIAGNOSIS — E039 Hypothyroidism, unspecified: Secondary | ICD-10-CM | POA: Diagnosis not present

## 2023-10-24 DIAGNOSIS — E119 Type 2 diabetes mellitus without complications: Secondary | ICD-10-CM | POA: Diagnosis not present

## 2023-10-24 DIAGNOSIS — I10 Essential (primary) hypertension: Secondary | ICD-10-CM | POA: Diagnosis not present

## 2023-10-24 DIAGNOSIS — K219 Gastro-esophageal reflux disease without esophagitis: Secondary | ICD-10-CM | POA: Diagnosis not present

## 2023-10-24 DIAGNOSIS — F419 Anxiety disorder, unspecified: Secondary | ICD-10-CM | POA: Diagnosis not present

## 2023-10-24 DIAGNOSIS — K760 Fatty (change of) liver, not elsewhere classified: Secondary | ICD-10-CM | POA: Diagnosis not present

## 2023-10-24 DIAGNOSIS — F32A Depression, unspecified: Secondary | ICD-10-CM | POA: Diagnosis not present

## 2023-10-24 DIAGNOSIS — E669 Obesity, unspecified: Secondary | ICD-10-CM | POA: Diagnosis not present

## 2023-10-24 DIAGNOSIS — M5126 Other intervertebral disc displacement, lumbar region: Secondary | ICD-10-CM | POA: Diagnosis not present

## 2023-10-24 DIAGNOSIS — K5790 Diverticulosis of intestine, part unspecified, without perforation or abscess without bleeding: Secondary | ICD-10-CM | POA: Diagnosis not present

## 2023-10-24 DIAGNOSIS — Z96651 Presence of right artificial knee joint: Secondary | ICD-10-CM | POA: Diagnosis not present

## 2023-10-24 DIAGNOSIS — Z6841 Body Mass Index (BMI) 40.0 and over, adult: Secondary | ICD-10-CM | POA: Diagnosis not present

## 2023-10-24 DIAGNOSIS — F1090 Alcohol use, unspecified, uncomplicated: Secondary | ICD-10-CM | POA: Diagnosis not present

## 2023-10-25 DIAGNOSIS — Z6841 Body Mass Index (BMI) 40.0 and over, adult: Secondary | ICD-10-CM | POA: Diagnosis not present

## 2023-10-25 DIAGNOSIS — I10 Essential (primary) hypertension: Secondary | ICD-10-CM | POA: Diagnosis not present

## 2023-10-25 DIAGNOSIS — E669 Obesity, unspecified: Secondary | ICD-10-CM | POA: Diagnosis not present

## 2023-10-25 DIAGNOSIS — E039 Hypothyroidism, unspecified: Secondary | ICD-10-CM | POA: Diagnosis not present

## 2023-10-25 DIAGNOSIS — M5126 Other intervertebral disc displacement, lumbar region: Secondary | ICD-10-CM | POA: Diagnosis not present

## 2023-10-25 DIAGNOSIS — G4733 Obstructive sleep apnea (adult) (pediatric): Secondary | ICD-10-CM | POA: Diagnosis not present

## 2023-10-25 DIAGNOSIS — F419 Anxiety disorder, unspecified: Secondary | ICD-10-CM | POA: Diagnosis not present

## 2023-10-25 DIAGNOSIS — K219 Gastro-esophageal reflux disease without esophagitis: Secondary | ICD-10-CM | POA: Diagnosis not present

## 2023-10-25 DIAGNOSIS — K5790 Diverticulosis of intestine, part unspecified, without perforation or abscess without bleeding: Secondary | ICD-10-CM | POA: Diagnosis not present

## 2023-10-25 DIAGNOSIS — F32A Depression, unspecified: Secondary | ICD-10-CM | POA: Diagnosis not present

## 2023-10-25 DIAGNOSIS — K760 Fatty (change of) liver, not elsewhere classified: Secondary | ICD-10-CM | POA: Diagnosis not present

## 2023-10-25 DIAGNOSIS — E119 Type 2 diabetes mellitus without complications: Secondary | ICD-10-CM | POA: Diagnosis not present

## 2023-10-25 DIAGNOSIS — Z96651 Presence of right artificial knee joint: Secondary | ICD-10-CM | POA: Diagnosis not present

## 2023-10-25 DIAGNOSIS — F17210 Nicotine dependence, cigarettes, uncomplicated: Secondary | ICD-10-CM | POA: Diagnosis not present

## 2023-10-25 DIAGNOSIS — Z471 Aftercare following joint replacement surgery: Secondary | ICD-10-CM | POA: Diagnosis not present

## 2023-10-25 DIAGNOSIS — F1090 Alcohol use, unspecified, uncomplicated: Secondary | ICD-10-CM | POA: Diagnosis not present

## 2023-10-27 ENCOUNTER — Encounter: Payer: Self-pay | Admitting: Internal Medicine

## 2023-10-27 DIAGNOSIS — K219 Gastro-esophageal reflux disease without esophagitis: Secondary | ICD-10-CM | POA: Diagnosis not present

## 2023-10-27 DIAGNOSIS — K5790 Diverticulosis of intestine, part unspecified, without perforation or abscess without bleeding: Secondary | ICD-10-CM | POA: Diagnosis not present

## 2023-10-27 DIAGNOSIS — M5126 Other intervertebral disc displacement, lumbar region: Secondary | ICD-10-CM | POA: Diagnosis not present

## 2023-10-27 DIAGNOSIS — Z471 Aftercare following joint replacement surgery: Secondary | ICD-10-CM | POA: Diagnosis not present

## 2023-10-27 DIAGNOSIS — E119 Type 2 diabetes mellitus without complications: Secondary | ICD-10-CM | POA: Diagnosis not present

## 2023-10-27 DIAGNOSIS — F1090 Alcohol use, unspecified, uncomplicated: Secondary | ICD-10-CM | POA: Diagnosis not present

## 2023-10-27 DIAGNOSIS — E669 Obesity, unspecified: Secondary | ICD-10-CM | POA: Diagnosis not present

## 2023-10-27 DIAGNOSIS — Z6841 Body Mass Index (BMI) 40.0 and over, adult: Secondary | ICD-10-CM | POA: Diagnosis not present

## 2023-10-27 DIAGNOSIS — G4733 Obstructive sleep apnea (adult) (pediatric): Secondary | ICD-10-CM | POA: Diagnosis not present

## 2023-10-27 DIAGNOSIS — F419 Anxiety disorder, unspecified: Secondary | ICD-10-CM | POA: Diagnosis not present

## 2023-10-27 DIAGNOSIS — K760 Fatty (change of) liver, not elsewhere classified: Secondary | ICD-10-CM | POA: Diagnosis not present

## 2023-10-27 DIAGNOSIS — Z96651 Presence of right artificial knee joint: Secondary | ICD-10-CM | POA: Diagnosis not present

## 2023-10-27 DIAGNOSIS — I10 Essential (primary) hypertension: Secondary | ICD-10-CM | POA: Diagnosis not present

## 2023-10-27 DIAGNOSIS — F17210 Nicotine dependence, cigarettes, uncomplicated: Secondary | ICD-10-CM | POA: Diagnosis not present

## 2023-10-27 DIAGNOSIS — E039 Hypothyroidism, unspecified: Secondary | ICD-10-CM | POA: Diagnosis not present

## 2023-10-27 DIAGNOSIS — F32A Depression, unspecified: Secondary | ICD-10-CM | POA: Diagnosis not present

## 2023-10-30 DIAGNOSIS — Z6841 Body Mass Index (BMI) 40.0 and over, adult: Secondary | ICD-10-CM | POA: Diagnosis not present

## 2023-10-30 DIAGNOSIS — K760 Fatty (change of) liver, not elsewhere classified: Secondary | ICD-10-CM | POA: Diagnosis not present

## 2023-10-30 DIAGNOSIS — K219 Gastro-esophageal reflux disease without esophagitis: Secondary | ICD-10-CM | POA: Diagnosis not present

## 2023-10-30 DIAGNOSIS — Z96651 Presence of right artificial knee joint: Secondary | ICD-10-CM | POA: Diagnosis not present

## 2023-10-30 DIAGNOSIS — F419 Anxiety disorder, unspecified: Secondary | ICD-10-CM | POA: Diagnosis not present

## 2023-10-30 DIAGNOSIS — G4733 Obstructive sleep apnea (adult) (pediatric): Secondary | ICD-10-CM | POA: Diagnosis not present

## 2023-10-30 DIAGNOSIS — M5126 Other intervertebral disc displacement, lumbar region: Secondary | ICD-10-CM | POA: Diagnosis not present

## 2023-10-30 DIAGNOSIS — E669 Obesity, unspecified: Secondary | ICD-10-CM | POA: Diagnosis not present

## 2023-10-30 DIAGNOSIS — F32A Depression, unspecified: Secondary | ICD-10-CM | POA: Diagnosis not present

## 2023-10-30 DIAGNOSIS — F1090 Alcohol use, unspecified, uncomplicated: Secondary | ICD-10-CM | POA: Diagnosis not present

## 2023-10-30 DIAGNOSIS — K5790 Diverticulosis of intestine, part unspecified, without perforation or abscess without bleeding: Secondary | ICD-10-CM | POA: Diagnosis not present

## 2023-10-30 DIAGNOSIS — I10 Essential (primary) hypertension: Secondary | ICD-10-CM | POA: Diagnosis not present

## 2023-10-30 DIAGNOSIS — E119 Type 2 diabetes mellitus without complications: Secondary | ICD-10-CM | POA: Diagnosis not present

## 2023-10-30 DIAGNOSIS — Z471 Aftercare following joint replacement surgery: Secondary | ICD-10-CM | POA: Diagnosis not present

## 2023-10-30 DIAGNOSIS — F17210 Nicotine dependence, cigarettes, uncomplicated: Secondary | ICD-10-CM | POA: Diagnosis not present

## 2023-10-30 DIAGNOSIS — E039 Hypothyroidism, unspecified: Secondary | ICD-10-CM | POA: Diagnosis not present

## 2023-10-31 NOTE — Telephone Encounter (Signed)
 Noted.

## 2023-11-01 DIAGNOSIS — E669 Obesity, unspecified: Secondary | ICD-10-CM | POA: Diagnosis not present

## 2023-11-01 DIAGNOSIS — K760 Fatty (change of) liver, not elsewhere classified: Secondary | ICD-10-CM | POA: Diagnosis not present

## 2023-11-01 DIAGNOSIS — F32A Depression, unspecified: Secondary | ICD-10-CM | POA: Diagnosis not present

## 2023-11-01 DIAGNOSIS — K5790 Diverticulosis of intestine, part unspecified, without perforation or abscess without bleeding: Secondary | ICD-10-CM | POA: Diagnosis not present

## 2023-11-01 DIAGNOSIS — M5126 Other intervertebral disc displacement, lumbar region: Secondary | ICD-10-CM | POA: Diagnosis not present

## 2023-11-01 DIAGNOSIS — F1090 Alcohol use, unspecified, uncomplicated: Secondary | ICD-10-CM | POA: Diagnosis not present

## 2023-11-01 DIAGNOSIS — I10 Essential (primary) hypertension: Secondary | ICD-10-CM | POA: Diagnosis not present

## 2023-11-01 DIAGNOSIS — Z6841 Body Mass Index (BMI) 40.0 and over, adult: Secondary | ICD-10-CM | POA: Diagnosis not present

## 2023-11-01 DIAGNOSIS — Z471 Aftercare following joint replacement surgery: Secondary | ICD-10-CM | POA: Diagnosis not present

## 2023-11-01 DIAGNOSIS — F419 Anxiety disorder, unspecified: Secondary | ICD-10-CM | POA: Diagnosis not present

## 2023-11-01 DIAGNOSIS — F17210 Nicotine dependence, cigarettes, uncomplicated: Secondary | ICD-10-CM | POA: Diagnosis not present

## 2023-11-01 DIAGNOSIS — G4733 Obstructive sleep apnea (adult) (pediatric): Secondary | ICD-10-CM | POA: Diagnosis not present

## 2023-11-01 DIAGNOSIS — E119 Type 2 diabetes mellitus without complications: Secondary | ICD-10-CM | POA: Diagnosis not present

## 2023-11-01 DIAGNOSIS — Z96651 Presence of right artificial knee joint: Secondary | ICD-10-CM | POA: Diagnosis not present

## 2023-11-01 DIAGNOSIS — E039 Hypothyroidism, unspecified: Secondary | ICD-10-CM | POA: Diagnosis not present

## 2023-11-01 DIAGNOSIS — K219 Gastro-esophageal reflux disease without esophagitis: Secondary | ICD-10-CM | POA: Diagnosis not present

## 2023-11-02 DIAGNOSIS — M25561 Pain in right knee: Secondary | ICD-10-CM | POA: Diagnosis not present

## 2023-11-02 DIAGNOSIS — Z96651 Presence of right artificial knee joint: Secondary | ICD-10-CM | POA: Diagnosis not present

## 2023-11-07 DIAGNOSIS — M25561 Pain in right knee: Secondary | ICD-10-CM | POA: Diagnosis not present

## 2023-11-07 DIAGNOSIS — Z96651 Presence of right artificial knee joint: Secondary | ICD-10-CM | POA: Diagnosis not present

## 2023-11-07 DIAGNOSIS — Z471 Aftercare following joint replacement surgery: Secondary | ICD-10-CM | POA: Diagnosis not present

## 2023-11-09 DIAGNOSIS — Z96651 Presence of right artificial knee joint: Secondary | ICD-10-CM | POA: Diagnosis not present

## 2023-11-09 DIAGNOSIS — M25561 Pain in right knee: Secondary | ICD-10-CM | POA: Diagnosis not present

## 2023-11-13 DIAGNOSIS — M25561 Pain in right knee: Secondary | ICD-10-CM | POA: Diagnosis not present

## 2023-11-13 DIAGNOSIS — Z96651 Presence of right artificial knee joint: Secondary | ICD-10-CM | POA: Diagnosis not present

## 2023-11-15 DIAGNOSIS — Z96651 Presence of right artificial knee joint: Secondary | ICD-10-CM | POA: Diagnosis not present

## 2023-11-15 DIAGNOSIS — M25561 Pain in right knee: Secondary | ICD-10-CM | POA: Diagnosis not present

## 2023-11-20 DIAGNOSIS — M25561 Pain in right knee: Secondary | ICD-10-CM | POA: Diagnosis not present

## 2023-11-20 DIAGNOSIS — Z96651 Presence of right artificial knee joint: Secondary | ICD-10-CM | POA: Diagnosis not present

## 2023-11-22 DIAGNOSIS — M25561 Pain in right knee: Secondary | ICD-10-CM | POA: Diagnosis not present

## 2023-11-22 DIAGNOSIS — Z96651 Presence of right artificial knee joint: Secondary | ICD-10-CM | POA: Diagnosis not present

## 2023-11-27 DIAGNOSIS — Z96651 Presence of right artificial knee joint: Secondary | ICD-10-CM | POA: Diagnosis not present

## 2023-11-27 DIAGNOSIS — M25561 Pain in right knee: Secondary | ICD-10-CM | POA: Diagnosis not present

## 2023-11-30 DIAGNOSIS — Z96651 Presence of right artificial knee joint: Secondary | ICD-10-CM | POA: Diagnosis not present

## 2023-11-30 DIAGNOSIS — M25561 Pain in right knee: Secondary | ICD-10-CM | POA: Diagnosis not present

## 2023-12-04 DIAGNOSIS — Z96651 Presence of right artificial knee joint: Secondary | ICD-10-CM | POA: Diagnosis not present

## 2023-12-04 DIAGNOSIS — M25561 Pain in right knee: Secondary | ICD-10-CM | POA: Diagnosis not present

## 2023-12-08 ENCOUNTER — Encounter: Payer: Self-pay | Admitting: Internal Medicine

## 2023-12-21 ENCOUNTER — Other Ambulatory Visit: Payer: Self-pay | Admitting: Internal Medicine

## 2024-01-09 ENCOUNTER — Telehealth: Payer: Self-pay | Admitting: Internal Medicine

## 2024-01-09 DIAGNOSIS — I10 Essential (primary) hypertension: Secondary | ICD-10-CM

## 2024-01-09 DIAGNOSIS — E782 Mixed hyperlipidemia: Secondary | ICD-10-CM

## 2024-01-09 DIAGNOSIS — R7303 Prediabetes: Secondary | ICD-10-CM

## 2024-01-09 DIAGNOSIS — E039 Hypothyroidism, unspecified: Secondary | ICD-10-CM

## 2024-01-09 NOTE — Telephone Encounter (Signed)
 Patient need lab orders.

## 2024-01-16 ENCOUNTER — Other Ambulatory Visit (INDEPENDENT_AMBULATORY_CARE_PROVIDER_SITE_OTHER): Payer: Medicare Other

## 2024-01-16 DIAGNOSIS — I10 Essential (primary) hypertension: Secondary | ICD-10-CM

## 2024-01-16 DIAGNOSIS — E782 Mixed hyperlipidemia: Secondary | ICD-10-CM | POA: Diagnosis not present

## 2024-01-16 DIAGNOSIS — R7303 Prediabetes: Secondary | ICD-10-CM | POA: Diagnosis not present

## 2024-01-16 LAB — LIPID PANEL
Cholesterol: 175 mg/dL (ref 0–200)
HDL: 44.5 mg/dL (ref >39.00)
LDL Cholesterol: 93 mg/dL (ref 0–99)
NonHDL: 130.84
Total CHOL/HDL Ratio: 4
Triglycerides: 189 mg/dL — ABNORMAL HIGH (ref 0.0–149.0)
VLDL: 37.8 mg/dL (ref 0.0–40.0)

## 2024-01-16 LAB — COMPREHENSIVE METABOLIC PANEL WITH GFR
ALT: 21 U/L (ref 0–35)
AST: 22 U/L (ref 0–37)
Albumin: 4.6 g/dL (ref 3.5–5.2)
Alkaline Phosphatase: 51 U/L (ref 39–117)
BUN: 16 mg/dL (ref 6–23)
CO2: 33 meq/L — ABNORMAL HIGH (ref 19–32)
Calcium: 9.9 mg/dL (ref 8.4–10.5)
Chloride: 99 meq/L (ref 96–112)
Creatinine, Ser: 0.75 mg/dL (ref 0.40–1.20)
GFR: 84.96 mL/min (ref >60.00)
Glucose, Bld: 95 mg/dL (ref 70–99)
Potassium: 4.2 meq/L (ref 3.5–5.1)
Sodium: 140 meq/L (ref 135–145)
Total Bilirubin: 0.4 mg/dL (ref 0.2–1.2)
Total Protein: 7.8 g/dL (ref 6.0–8.3)

## 2024-01-16 LAB — HEMOGLOBIN A1C: Hgb A1c MFr Bld: 6.3 % (ref 4.6–6.5)

## 2024-01-16 LAB — MICROALBUMIN / CREATININE URINE RATIO
Creatinine,U: 66 mg/dL
Microalb Creat Ratio: 14 mg/g (ref 0.0–30.0)
Microalb, Ur: 0.9 mg/dL (ref 0.0–1.9)

## 2024-01-16 NOTE — Addendum Note (Signed)
 Addended by: TANDA HARVEY D on: 01/16/2024 08:48 AM   Modules accepted: Orders

## 2024-01-20 ENCOUNTER — Ambulatory Visit: Payer: Self-pay | Admitting: Internal Medicine

## 2024-01-25 ENCOUNTER — Ambulatory Visit (INDEPENDENT_AMBULATORY_CARE_PROVIDER_SITE_OTHER): Payer: Medicare Other | Admitting: Internal Medicine

## 2024-01-25 ENCOUNTER — Encounter: Payer: Self-pay | Admitting: Internal Medicine

## 2024-01-25 VITALS — BP 120/78 | HR 81 | Temp 97.7°F | Resp 20 | Ht 70.0 in | Wt 295.5 lb

## 2024-01-25 DIAGNOSIS — Z1231 Encounter for screening mammogram for malignant neoplasm of breast: Secondary | ICD-10-CM | POA: Diagnosis not present

## 2024-01-25 DIAGNOSIS — Z720 Tobacco use: Secondary | ICD-10-CM

## 2024-01-25 DIAGNOSIS — R030 Elevated blood-pressure reading, without diagnosis of hypertension: Secondary | ICD-10-CM

## 2024-01-25 DIAGNOSIS — Z96651 Presence of right artificial knee joint: Secondary | ICD-10-CM

## 2024-01-25 DIAGNOSIS — I10 Essential (primary) hypertension: Secondary | ICD-10-CM

## 2024-01-25 DIAGNOSIS — E782 Mixed hyperlipidemia: Secondary | ICD-10-CM

## 2024-01-25 DIAGNOSIS — E538 Deficiency of other specified B group vitamins: Secondary | ICD-10-CM

## 2024-01-25 DIAGNOSIS — K760 Fatty (change of) liver, not elsewhere classified: Secondary | ICD-10-CM | POA: Diagnosis not present

## 2024-01-25 DIAGNOSIS — R7303 Prediabetes: Secondary | ICD-10-CM

## 2024-01-25 DIAGNOSIS — E039 Hypothyroidism, unspecified: Secondary | ICD-10-CM

## 2024-01-25 DIAGNOSIS — E559 Vitamin D deficiency, unspecified: Secondary | ICD-10-CM

## 2024-01-25 DIAGNOSIS — D649 Anemia, unspecified: Secondary | ICD-10-CM

## 2024-01-25 NOTE — Progress Notes (Signed)
 Subjective:  Patient ID: Lisa Valencia, female    DOB: 1960-08-01  Age: 63 y.o. MRN: [id number]  CC: The primary encounter diagnosis was Encounter for screening mammogram for malignant neoplasm of breast. Diagnoses of Elevated blood-pressure reading, without diagnosis of hypertension, Essential hypertension, Hepatic steatosis, Hypothyroidism (acquired), Prediabetes, Vitamin D  deficiency, B12 deficiency, Mixed hyperlipidemia, History of total knee arthroplasty, right, Postoperative anemia, and Tobacco abuse were also pertinent to this visit.   HPI Lisa Valencia presents for  Chief Complaint  Patient presents with   Medical Management of Chronic Issues    6 months follow up   1) s/p R TKR  in April 2025 by [name].  Surgery was not complicated by infection.  She is progressing with PT.  Has some  numbness on lateral side of knee   and mild knee pain aggravated by going down stairs.  2nd post op visit was last week.  She was able to extend to 128 degrees.  ( goal was 120)   Has completed PT  2) HTN:  she had hypotension post surgery,  telmisartan  was suspended for severe hypotension (60/40) but sent home same day bc she was  asymptomatic . Home readings  jhave improved but remained below treatable threshold   3) folic acid  deficiency  4) obesity/prediabetes:  has been doing intermittent fasting  after dinner.  Craving sweets  5) tobacco abuse;  still smoking 1/2 pack daily,  , resumed after  surgery .  She declines lung ca screening CT    Outpatient Medications Prior to Visit  Medication Sig Dispense Refill   acetaminophen  (TYLENOL ) 500 MG tablet Take 1,000 mg by mouth every 6 (six) hours as needed for moderate pain (pain score 4-6).     aspirin  EC 81 MG tablet Take 1 tablet (81 mg total) by mouth daily.     celecoxib  (CELEBREX ) 200 MG capsule Take 1 capsule (200 mg total) by mouth 2 (two) times daily. 60 capsule 1   cetirizine (ZYRTEC) 10 MG tablet Take 10 mg by mouth daily. (Patient not  taking: Reported on 10/09/2023)     cholecalciferol (VITAMIN D3) 25 MCG (1000 UNIT) tablet Take 2,000 Units by mouth once a week. Friday     cyanocobalamin  (VITAMIN B12) 500 MCG tablet Take 500 mcg by mouth daily.     fexofenadine-pseudoephedrine (ALLEGRA-D 24) 180-240 MG 24 hr tablet Take 1 tablet by mouth at bedtime.     folic acid  (FOLVITE ) 1 MG tablet Take 1 tablet (1 mg total) by mouth daily. 90 tablet 2   levothyroxine  (SYNTHROID ) 112 MCG tablet Take 1 tablet (112 mcg total) by mouth daily. (Patient taking differently: Take 112 mcg by mouth See admin instructions. Take 112 mcg by mouth daily, except on Sunday take 224 mcg) 90 tablet 3   methocarbamol  (ROBAXIN ) 500 MG tablet Take 500 mg by mouth every 6 (six) hours as needed for muscle spasms.     Omega-3 Fatty Acids (FISH OIL) 1000 MG CAPS Take 1,000 mg by mouth daily.      omeprazole  (PRILOSEC) 20 MG capsule TAKE 1 CAPSULE BY MOUTH TWICE DAILY BEFORE MEALS. 180 capsule 3   oxyCODONE  (OXY IR/ROXICODONE ) 5 MG immediate release tablet Take 1 tablet (5 mg total) by mouth every 4 (four) hours as needed for moderate pain (pain score 4-6) (pain score 4-6). 30 tablet 0   simvastatin  (ZOCOR ) 20 MG tablet Take 1 tablet (20 mg total) by mouth at bedtime. 90 tablet 3   spironolactone  (  ALDACTONE ) 50 MG tablet TAKE 1 TABLET BY MOUTH DAILY. GENERIC EQUIVALENT FOR ALDACTONE  90 tablet 3   vitamin E 200 UNIT capsule Take 200 Units by mouth daily.     telmisartan  (MICARDIS ) 80 MG tablet Take 1 tablet (80 mg total) by mouth at bedtime. 90 tablet 1   traMADol  (ULTRAM ) 50 MG tablet Take 1-2 tablets (50-100 mg total) by mouth every 4 (four) hours as needed for moderate pain (pain score 4-6). 30 tablet 0   No facility-administered medications prior to visit.    Review of Systems;  Patient denies headache, fevers, malaise, unintentional weight loss, skin rash, eye pain, sinus congestion and sinus pain, sore throat, dysphagia,  hemoptysis , cough, dyspnea,  wheezing, chest pain, palpitations, orthopnea, edema, abdominal pain, nausea, melena, diarrhea, constipation, flank pain, dysuria, hematuria, urinary  Frequency, nocturia, numbness, tingling, seizures,  Focal weakness, Loss of consciousness,  Tremor, insomnia, depression, anxiety, and suicidal ideation.      Objective:  BP 120/78   Pulse 81   Temp 97.7 F (36.5 C)   Resp 20   Ht 5' 10 (1.778 m)   Wt 295 lb 8 oz (134 kg)   SpO2 97%   BMI 42.40 kg/m   BP Readings from Last 3 Encounters:  01/25/24 120/78  10/19/23 (!) 93/48  10/09/23 131/84    Wt Readings from Last 3 Encounters:  01/25/24 295 lb 8 oz (134 kg)  10/18/23 296 lb (134.3 kg)  10/09/23 296 lb (134.3 kg)    Physical Exam Vitals reviewed.  Constitutional:      General: She is not in acute distress.    Appearance: Normal appearance. She is obese. She is not ill-appearing, toxic-appearing or diaphoretic.  HENT:     Head: Normocephalic.  Eyes:     General: No scleral icterus.       Right eye: No discharge.        Left eye: No discharge.     Conjunctiva/sclera: Conjunctivae normal.  Cardiovascular:     Rate and Rhythm: Normal rate and regular rhythm.     Heart sounds: Normal heart sounds.  Pulmonary:     Effort: Pulmonary effort is normal. No respiratory distress.     Breath sounds: Normal breath sounds.  Musculoskeletal:        General: Normal range of motion.  Skin:    General: Skin is warm and dry.  Neurological:     General: No focal deficit present.     Mental Status: She is alert and oriented to person, place, and time. Mental status is at baseline.  Psychiatric:        Mood and Affect: Mood normal.        Behavior: Behavior normal.        Thought Content: Thought content normal.        Judgment: Judgment normal.     Lab Results  Component Value Date   HGBA1C 6.3 01/16/2024   HGBA1C 6.1 (H) 10/09/2023   HGBA1C 6.1 06/22/2023    Lab Results  Component Value Date   CREATININE 0.75  01/16/2024   CREATININE 0.66 10/04/2023   CREATININE 0.79 08/15/2023    Lab Results  Component Value Date   WBC 6.9 10/09/2023   HGB 13.4 10/09/2023   HCT 39.8 10/09/2023   PLT 232 10/09/2023   GLUCOSE 95 01/16/2024   CHOL 175 01/16/2024   TRIG 189.0 (H) 01/16/2024   HDL 44.50 01/16/2024   LDLDIRECT 118.0 06/22/2023   LDLCALC 93 01/16/2024  ALT 21 01/16/2024   AST 22 01/16/2024   NA 140 01/16/2024   K 4.2 01/16/2024   CL 99 01/16/2024   CREATININE 0.75 01/16/2024   BUN 16 01/16/2024   CO2 33 (H) 01/16/2024   TSH 2.52 08/15/2023   INR 0.90 04/19/2018   HGBA1C 6.3 01/16/2024   MICROALBUR 0.9 01/16/2024    DG Knee Right Port Result Date: 10/18/2023 CLINICAL DATA:  Total knee replacement EXAM: PORTABLE RIGHT KNEE - 1-2 VIEW COMPARISON:  None Available. FINDINGS: Right knee arthroplasty in expected alignment. No periprosthetic lucency or fracture. There has been patellar resurfacing. Recent postsurgical change includes air and edema in the soft tissues and joint space. Anterior skin staples in place. Probable drain in place. IMPRESSION: Right knee arthroplasty without immediate postoperative complication. Electronically Signed   By: Andrea Gasman M.D.   On: 10/18/2023 18:23    Assessment & Plan:  .Encounter for screening mammogram for malignant neoplasm of breast -     3D Screening Mammogram, Left and Right; Future  Elevated blood-pressure reading, without diagnosis of hypertension  Essential hypertension Assessment & Plan: Currently treated only with spironolactone  since discharge due to hypotenision. . Advised to resume 20mg  telmisartan  for readings persistenetly > 130/80   Orders: -     Comprehensive metabolic panel with GFR; Future -     Lipid Panel w/reflex Direct LDL; Future  Hepatic steatosis Assessment & Plan: Managed with statin,  and low GI diet  LFTs are normal.  She is no longer taking  metformin  due to diarrhea   Lab Results  Component Value Date    ALT 21 01/16/2024   AST 22 01/16/2024   ALKPHOS 51 01/16/2024   BILITOT 0.4 01/16/2024     Orders: -     Lipid Panel w/reflex Direct LDL; Future  Hypothyroidism (acquired) -     Thyroid  Panel With TSH; Future  Prediabetes Assessment & Plan: a1c has risen slightly since stopping Ozempic  and gaining weight  Lab Results  Component Value Date   HGBA1C 6.3 01/16/2024     Orders: -     Hemoglobin A1c; Future  Vitamin D  deficiency -     VITAMIN D  25 Hydroxy (Vit-D Deficiency, Fractures); Future  B12 deficiency -     B12 and Folate Panel; Future  Mixed hyperlipidemia -     Lipid Panel w/reflex Direct LDL; Future  History of total knee arthroplasty, right Assessment & Plan: She continues to improve wit repest to mobility.  Encouraged to resume exercising    Postoperative anemia Assessment & Plan: PResolved .  Continue b12 and folate supplementations   Lab Results  Component Value Date   WBC 6.9 10/09/2023   HGB 13.4 10/09/2023   HCT 39.8 10/09/2023   MCV 81.6 10/09/2023   PLT 232 10/09/2023   Lab Results  Component Value Date   IRON 67 06/22/2023   TIBC 392 06/22/2023   FERRITIN 34 06/22/2023       Tobacco abuse Assessment & Plan: She has a history of intolerance to  Chantix  due to persistent nausea and weight gain . Prior trial of wellbutrin was  not tolerated either . encouraaged to weekly taper  use by one reducing one cig per day         I spent 40 minutes on the day of this face to face encounter reviewing patient's  most recent visit with orthopedics.   prior relevant surgical and non surgical procedures, recent  labs and imaging studies, counseling on weight  management and tobacco cessation.   reviewing the assessment and plan with patient, and post visit ordering and reviewing of  diagnostics and therapeutics with patient  .   Follow-up: Return in about 6 months (around 07/27/2024).   Lisa LITTIE Kettering, MD

## 2024-01-25 NOTE — Patient Instructions (Addendum)
 You can  start taking  1/2 of a 40 mg telmisartan  daily once your systolic numbers  reach 130 and stay at 130 or higher   Use  the pool /aerobics for your exercise .  You're retired!  Just do it!!!  For your sugar/chocolate cravings, keep these items handy  so you don't blow it:  Healthy Choice Fudge bars Carb Smart ice cream bars Premier protein chocolate shakes   Reduce your daily CIGARETTE QUOTA by 1 per MONTH

## 2024-01-27 NOTE — Assessment & Plan Note (Signed)
 She has a history of intolerance to  Chantix  due to persistent nausea and weight gain . Prior trial of wellbutrin was  not tolerated either . encouraaged to weekly taper  use by one reducing one cig per day

## 2024-01-27 NOTE — Assessment & Plan Note (Signed)
 Managed with statin,  and low GI diet  LFTs are normal.  She is no longer taking  metformin  due to diarrhea   Lab Results  Component Value Date   ALT 21 01/16/2024   AST 22 01/16/2024   ALKPHOS 51 01/16/2024   BILITOT 0.4 01/16/2024

## 2024-01-27 NOTE — Assessment & Plan Note (Signed)
 She continues to improve wit repest to mobility.  Encouraged to resume exercising

## 2024-01-27 NOTE — Assessment & Plan Note (Signed)
 PResolved .  Continue b12 and folate supplementations   Lab Results  Component Value Date   WBC 6.9 10/09/2023   HGB 13.4 10/09/2023   HCT 39.8 10/09/2023   MCV 81.6 10/09/2023   PLT 232 10/09/2023   Lab Results  Component Value Date   IRON 67 06/22/2023   TIBC 392 06/22/2023   FERRITIN 34 06/22/2023

## 2024-01-27 NOTE — Assessment & Plan Note (Signed)
 a1c has risen slightly since stopping Ozempic  and gaining weight  Lab Results  Component Value Date   HGBA1C 6.3 01/16/2024

## 2024-01-27 NOTE — Assessment & Plan Note (Signed)
 Currently treated only with spironolactone  since discharge due to hypotenision. . Advised to resume 20mg  telmisartan  for readings persistenetly > 130/80

## 2024-02-13 ENCOUNTER — Encounter: Payer: Self-pay | Admitting: Internal Medicine

## 2024-02-15 ENCOUNTER — Other Ambulatory Visit: Payer: Self-pay | Admitting: Internal Medicine

## 2024-02-15 ENCOUNTER — Encounter: Payer: Self-pay | Admitting: Internal Medicine

## 2024-02-15 DIAGNOSIS — N6311 Unspecified lump in the right breast, upper outer quadrant: Secondary | ICD-10-CM

## 2024-02-17 ENCOUNTER — Other Ambulatory Visit: Payer: Self-pay | Admitting: Internal Medicine

## 2024-02-19 ENCOUNTER — Other Ambulatory Visit: Payer: Self-pay

## 2024-02-19 MED ORDER — CELECOXIB 200 MG PO CAPS: 200.0000 mg | ORAL_CAPSULE | Freq: Two times a day (BID) | ORAL | 0 refills | Status: DC

## 2024-02-19 NOTE — Telephone Encounter (Signed)
 Historical medication Refilled: 10/19/2023 Last OV: 7/101/2025 Next OV: 07/31/2024

## 2024-02-22 ENCOUNTER — Ambulatory Visit
Admission: RE | Admit: 2024-02-22 | Discharge: 2024-02-22 | Disposition: A | Discharge status: Home / Self Care | Source: Ambulatory Visit | Attending: Internal Medicine | Admitting: Internal Medicine

## 2024-02-22 DIAGNOSIS — N6314 Unspecified lump in the right breast, lower inner quadrant: Secondary | ICD-10-CM | POA: Diagnosis not present

## 2024-02-22 DIAGNOSIS — N6311 Unspecified lump in the right breast, upper outer quadrant: Secondary | ICD-10-CM

## 2024-02-22 DIAGNOSIS — N6324 Unspecified lump in the left breast, lower inner quadrant: Secondary | ICD-10-CM | POA: Diagnosis not present

## 2024-02-22 DIAGNOSIS — N6315 Unspecified lump in the right breast, overlapping quadrants: Secondary | ICD-10-CM | POA: Diagnosis not present

## 2024-02-22 DIAGNOSIS — D369 Benign neoplasm, unspecified site: Secondary | ICD-10-CM | POA: Diagnosis not present

## 2024-02-27 ENCOUNTER — Other Ambulatory Visit: Payer: Self-pay

## 2024-03-21 ENCOUNTER — Encounter: Payer: Self-pay | Admitting: Internal Medicine

## 2024-03-26 NOTE — Telephone Encounter (Signed)
 Pt is requesting a letter to get her out of jury duty.

## 2024-03-27 NOTE — Telephone Encounter (Signed)
 LMTCB to let pt know that letter is ready and can be picked up. I have placed up front in accordion folder.

## 2024-04-01 NOTE — Telephone Encounter (Signed)
 Noted

## 2024-04-23 DIAGNOSIS — Z0120 Encounter for dental examination and cleaning without abnormal findings: Secondary | ICD-10-CM | POA: Diagnosis not present

## 2024-05-07 ENCOUNTER — Encounter: Payer: Self-pay | Admitting: Internal Medicine

## 2024-05-08 ENCOUNTER — Other Ambulatory Visit: Payer: Self-pay

## 2024-05-08 MED ORDER — LEVOTHYROXINE SODIUM 112 MCG PO TABS: 112.0000 ug | ORAL_TABLET | ORAL | 0 refills | Status: DC

## 2024-05-14 ENCOUNTER — Other Ambulatory Visit: Payer: Self-pay | Admitting: Internal Medicine

## 2024-05-24 DIAGNOSIS — L720 Epidermal cyst: Secondary | ICD-10-CM | POA: Diagnosis not present

## 2024-05-24 DIAGNOSIS — L728 Other follicular cysts of the skin and subcutaneous tissue: Secondary | ICD-10-CM | POA: Diagnosis not present

## 2024-06-17 ENCOUNTER — Emergency Department
Admission: EM | Admit: 2024-06-17 | Discharge: 2024-06-17 | Disposition: A | Discharge status: Home / Self Care | Attending: Emergency Medicine | Admitting: Emergency Medicine

## 2024-06-17 ENCOUNTER — Other Ambulatory Visit: Payer: Self-pay

## 2024-06-17 ENCOUNTER — Emergency Department

## 2024-06-17 ENCOUNTER — Encounter: Payer: Self-pay | Admitting: Emergency Medicine

## 2024-06-17 ENCOUNTER — Inpatient Hospital Stay
Admission: RE | Admit: 2024-06-17 | Discharge: 2024-06-17 | Payer: Self-pay | Attending: Emergency Medicine | Admitting: Emergency Medicine

## 2024-06-17 VITALS — BP 146/74 | HR 73 | Temp 98.2°F | Resp 18 | Wt 300.0 lb

## 2024-06-17 DIAGNOSIS — Z87891 Personal history of nicotine dependence: Secondary | ICD-10-CM | POA: Insufficient documentation

## 2024-06-17 DIAGNOSIS — R600 Localized edema: Secondary | ICD-10-CM | POA: Insufficient documentation

## 2024-06-17 DIAGNOSIS — R079 Chest pain, unspecified: Secondary | ICD-10-CM

## 2024-06-17 DIAGNOSIS — R0602 Shortness of breath: Secondary | ICD-10-CM

## 2024-06-17 DIAGNOSIS — M7989 Other specified soft tissue disorders: Secondary | ICD-10-CM | POA: Diagnosis not present

## 2024-06-17 DIAGNOSIS — R0609 Other forms of dyspnea: Secondary | ICD-10-CM

## 2024-06-17 DIAGNOSIS — I10 Essential (primary) hypertension: Secondary | ICD-10-CM | POA: Insufficient documentation

## 2024-06-17 DIAGNOSIS — J9801 Acute bronchospasm: Secondary | ICD-10-CM | POA: Insufficient documentation

## 2024-06-17 LAB — BASIC METABOLIC PANEL WITH GFR
Anion gap: 11 (ref 5–15)
BUN: 12 mg/dL (ref 8–23)
CO2: 31 mmol/L (ref 22–32)
Calcium: 9.4 mg/dL (ref 8.9–10.3)
Chloride: 101 mmol/L (ref 98–111)
Creatinine, Ser: 0.63 mg/dL (ref 0.44–1.00)
GFR, Estimated: >60 mL/min (ref >60)
Glucose, Bld: 99 mg/dL (ref 70–99)
Potassium: 3.8 mmol/L (ref 3.5–5.1)
Sodium: 143 mmol/L (ref 135–145)

## 2024-06-17 LAB — CBC
HCT: 42.3 % (ref 36.0–46.0)
Hemoglobin: 13.8 g/dL (ref 12.0–15.0)
MCH: 28 pg (ref 26.0–34.0)
MCHC: 32.6 g/dL (ref 30.0–36.0)
MCV: 85.8 fL (ref 80.0–100.0)
Platelets: 176 K/uL (ref 150–400)
RBC: 4.93 MIL/uL (ref 3.87–5.11)
RDW: 14.6 % (ref 11.5–15.5)
WBC: 6.1 K/uL (ref 4.0–10.5)
nRBC: 0 % (ref 0.0–0.2)

## 2024-06-17 LAB — TROPONIN T, HIGH SENSITIVITY: Troponin T High Sensitivity: <15 ng/L (ref 0–19)

## 2024-06-17 LAB — PRO BRAIN NATRIURETIC PEPTIDE: Pro Brain Natriuretic Peptide: 58.6 pg/mL (ref <300.0)

## 2024-06-17 MED ORDER — PREDNISONE 50 MG PO TABS: 50.0000 mg | ORAL_TABLET | Freq: Every day | ORAL | 0 refills | Status: DC

## 2024-06-17 MED ORDER — IPRATROPIUM-ALBUTEROL 0.5-2.5 (3) MG/3ML IN SOLN
3.0000 mL | Freq: Once | RESPIRATORY_TRACT | Status: AC
  Administered 2024-06-17: 3 mL via RESPIRATORY_TRACT
  Filled 2024-06-17: qty 3

## 2024-06-17 MED ORDER — ALBUTEROL SULFATE HFA 108 (90 BASE) MCG/ACT IN AERS: 2.0000 | INHALATION_SPRAY | Freq: Four times a day (QID) | RESPIRATORY_TRACT | 2 refills | Status: AC | PRN

## 2024-06-17 NOTE — ED Provider Notes (Signed)
 HPI  SUBJECTIVE:  Lisa Valencia is a 63 y.o. female who presents with 10 days of left lower extremity edema that resolves with elevation and is worse with standing at the end of the day.  She reports some edema on her right lower extremity as well, but not as severe as the left.  No ankle pain.  She reports an episode of cramping in her posterior left calf, but this has resolved.  No recent immobilization, surgery in the past 12 weeks, hemoptysis, change in her medications or diet.   She also reports shortness of breath for the past month accompanied with dyspnea on exertion starting last week and shortness of breath with lying down flat..  She reports 1 to 2 weeks of substernal chest pressure described as congestion for the past 1 to 2 weeks that lasts about 15 minutes.  There is no exertional positional component to this.  No radiation of this pressure up her neck, down her arm or through to her back, accompanying nausea, diaphoresis, belching, waterbrash, burning chest pain.  She has never had symptoms like this before.  No unintentional weight gain, change in her baseline nocturia, abdominal pain, orthopnea, PND, chest pain, heaviness.  She has not tried anything for her symptoms.  It resolves on its own.  No aggravating factors  She has a past medical history of GERD, hyperlipidemia, hypertension, hypothyroidism, long history of smoking, BMI above 30.  No history of DVT/PE, cancer, pulmonary disease, diabetes, congestive heart failure, coronary disease, MI, CVA, PAD/PVD.  Family history negative for early MI.  PCP [place] [facility]..  Past Medical History:  Diagnosis Date   Allergy 2003   Anxiety    Arthritis    Depression 2001   Diverticulosis of intestine 2010   Esophagitis, reflux 2010   GERD (gastroesophageal reflux disease)    Hepatic steatosis    HLD (hyperlipidemia)    HTN (hypertension)    Hypothyroidism    Impaired fasting glucose 01/25/2012   Needs eye exam.      Long-term use of aspirin  therapy    Pre-diabetes     Past Surgical History:  Procedure Laterality Date   ABDOMINAL HYSTERECTOMY  2009   supracervical    APPENDECTOMY  1985   BREAST BIOPSY Right 01/26/2022   papilloma   BREAST CYST ASPIRATION Right    rt [id number]   COLONOSCOPY WITH PROPOFOL  N/A 06/05/2015   Procedure: COLONOSCOPY WITH PROPOFOL ;  Surgeon: Rogelia Copping, MD;  Location: [facility];  Service: Endoscopy;  Laterality: N/A;  Pt requests early. Pt for antibiotic pre-procedure  CECAL PROTRUSION BX. / WITH CLIP LOT # [id number] RESOLUTION 360 EXP 03/03/2016 SIGMOID POLYP   JOINT REPLACEMENT Right 2012 2019   hip, [name]   KNEE ARTHROPLASTY Right 10/18/2023   Procedure: ARTHROPLASTY, KNEE, TOTAL, USING IMAGELESS COMPUTER-ASSISTED NAVIGATION;  Surgeon: Mardee Lynwood SQUIBB, MD;  Location: [facility];  Service: Orthopedics;  Laterality: Right;   THYROIDECTOMY     TOTAL HIP ARTHROPLASTY Left 05/02/2018   Procedure: TOTAL HIP ARTHROPLASTY;  Surgeon: Mardee Lynwood SQUIBB, MD;  Location: [facility];  Service: Orthopedics;  Laterality: Left;    Family History  Problem Relation Age of Onset   Cancer Father        lung ca   Hyperlipidemia Father        PAD   Diabetes Sister    Cancer Paternal Aunt        breast   Breast cancer Paternal Aunt 30   Diabetes  Maternal Grandfather    Cancer Maternal Grandfather        colon ca   Cancer Paternal Grandmother        colon   Cancer Paternal Grandfather        colon ca   Heart disease Neg Hx     Social History   Tobacco Use   Smoking status: Former    Current packs/day: 1.00    Average packs/day: 1 pack/day for 35.0 years (35.0 ttl pk-yrs)    Types: Cigarettes   Smokeless tobacco: Never  Vaping Use   Vaping status: Never Used  Substance Use Topics   Alcohol use: Not Currently   Drug use: No    No current facility-administered medications for this encounter.  Current Outpatient Medications:    acetaminophen  (TYLENOL )  500 MG tablet, Take 1,000 mg by mouth every 6 (six) hours as needed for moderate pain (pain score 4-6)., Disp: , Rfl:    aspirin  EC 81 MG tablet, Take 1 tablet (81 mg total) by mouth daily., Disp: , Rfl:    celecoxib  (CELEBREX ) 200 MG capsule, TAKE 1 CAPSULE BY MOUTH TWICE DAILY, Disp: 180 capsule, Rfl: 0   cetirizine (ZYRTEC) 10 MG tablet, Take 10 mg by mouth daily. (Patient not taking: Reported on 10/09/2023), Disp: , Rfl:    cholecalciferol (VITAMIN D3) 25 MCG (1000 UNIT) tablet, Take 2,000 Units by mouth once a week. Friday, Disp: , Rfl:    cyanocobalamin  (VITAMIN B12) 500 MCG tablet, Take 500 mcg by mouth daily., Disp: , Rfl:    fexofenadine-pseudoephedrine (ALLEGRA-D 24) 180-240 MG 24 hr tablet, Take 1 tablet by mouth at bedtime., Disp: , Rfl:    folic acid  (FOLVITE ) 1 MG tablet, Take 1 tablet (1 mg total) by mouth daily., Disp: 90 tablet, Rfl: 2   levothyroxine  (SYNTHROID ) 112 MCG tablet, Take 1 tablet (112 mcg total) by mouth See admin instructions. Take 112 mcg by mouth daily, except on Sunday take 224 mcg, Disp: 102 tablet, Rfl: 0   methocarbamol  (ROBAXIN ) 500 MG tablet, Take 500 mg by mouth every 6 (six) hours as needed for muscle spasms., Disp: , Rfl:    Omega-3 Fatty Acids (FISH OIL) 1000 MG CAPS, Take 1,000 mg by mouth daily. , Disp: , Rfl:    omeprazole  (PRILOSEC) 20 MG capsule, TAKE 1 CAPSULE BY MOUTH TWICE DAILY BEFORE MEALS., Disp: 180 capsule, Rfl: 3   oxyCODONE  (OXY IR/ROXICODONE ) 5 MG immediate release tablet, Take 1 tablet (5 mg total) by mouth every 4 (four) hours as needed for moderate pain (pain score 4-6) (pain score 4-6)., Disp: 30 tablet, Rfl: 0   simvastatin  (ZOCOR ) 20 MG tablet, Take 1 tablet (20 mg total) by mouth at bedtime., Disp: 90 tablet, Rfl: 3   spironolactone  (ALDACTONE ) 50 MG tablet, TAKE 1 TABLET BY MOUTH DAILY GENERIC EQUIVALENT FOR ALDACTONE , Disp: 90 tablet, Rfl: 3   vitamin E 200 UNIT capsule, Take 200 Units by mouth daily., Disp: , Rfl:   Allergies   Allergen Reactions   Sulfa Antibiotics Swelling and Other (See Comments)    Angioedema THROAT AND TONGUE HAD SWELLING   Chantix  [Varenicline ] Nausea Only   Fexofenadine Other (See Comments)   Tramadol  Itching   Codeine Sulfate Nausea Only   Fenofibrate  Other (See Comments)    Muscle aches   Pseudoephedrine Hcl Palpitations   Wellbutrin [Bupropion] Hives     ROS  As noted in HPI.   Physical Exam  BP (!) 146/74 (BP Location: Left Arm)  Pulse 73   Temp 98.2 F (36.8 C) (Oral)   Resp 18   Wt 136.1 kg   SpO2 95%   BMI 43.05 kg/m   Constitutional: Well developed, well nourished, no acute distress Eyes: PERRL, EOMI, conjunctiva normal bilaterally HENT: Normocephalic, atraumatic,mucus membranes moist Respiratory: Clear to auscultation bilaterally, no rales, no wheezing, no rhonchi.  No reproducible chest wall tenderness. Cardiovascular: Normal rate and rhythm, no murmurs, no gallops, no rubs.  No appreciable JVD, but patient has difficulty lying down flat due to back spasms. GI: nondistended skin: No rash, skin intact Musculoskeletal: Trace bilateral lower extremity edema.  Calf symmetric 45 cm bilaterally.  No calf tenderness bilaterally.  No palpable cords, negative Homans sign left calf.  DP 2+.  Left ankle nontender. Neurologic: Alert & oriented x 3, CN III-XII grossly intact, no motor deficits, sensation grossly intact Psychiatric: Speech and behavior appropriate   ED Course   Medications - No data to display  Orders Placed This Encounter  Procedures   ED EKG    Standing Status:   Standing    Number of Occurrences:   1    Reason for Exam:   Shortness of breath   EKG 12-Lead    Standing Status:   Standing    Number of Occurrences:   1   No results found for this or any previous visit (from the past 24 hours). No results found.  ED Clinical Impression  1. Dyspnea on exertion   2. Lower extremity edema   3. Chest pain, unspecified type      ED  Assessment/Plan     Patient presents with 1 month of shortness of breath and 10 days of bilateral lower extremity edema, worse on the left, and 1 to 2 weeks of substernal chest pressure lasting 15 minutes.  Differential includes angina, congestive heart failure, deconditioning, pulmonary disease, DVT/PE.  Checking EKG.  EKG: Normal sinus rhythm, rate 73.  Normal axis, normal levels.  No hypertrophy.  No ST-T wave changes.  Patient asymptomatic while EKG was obtained.  HEART score:   History: Moderately suspicious +1. EKG: Normal 0 Age: 50.  +1 Risk factors: BMI above 30, hypercholesterolemia, smoking.  +2 Troponin: Not available  Total heart score +4.  Patient is at moderate risk of major cardiac event within the next 30 days.  Wells score for DVT 0.  Patient at low risk for DVT.  Discussed case with Dr. Marylynn, pt's PCP.  She agrees that ER evaluation is the appropriate disposition to rule out PE, ACS, CHF.  Patient's vitals are normal, her EKG is normal, I feel that she is stable to go to the ED via private vehicle.  Discussed rationale for transfer to the emergency department with the patient's and emphasized importance of going.  She agrees to go.  She will be going to [facility].  No orders of the defined types were placed in this encounter.     *This clinic note was created using Dragon dictation software. Therefore, there may be occasional mistakes despite careful proofreading. ?    Van Knee, MD 06/17/24 [phone]

## 2024-06-17 NOTE — ED Notes (Signed)
 Patient is being discharged from the Urgent Care and sent to the Emergency Department via POV . Per Dr.Mortenson, patient is in need of higher level of care due to DOE. Patient is aware and verbalizes understanding of plan of care.  Vitals:   06/17/24 0842  BP: (!) 146/74  Pulse: 73  Resp: 18  Temp: 98.2 F (36.8 C)  SpO2: 95%

## 2024-06-17 NOTE — ED Triage Notes (Signed)
 Pt presents with SOB with exertion for several weeks. She also has left ankle pain and swelling x 5 days. Pt denies any injury.

## 2024-06-17 NOTE — Discharge Instructions (Addendum)
 I talked with Dr. Marylynn, she is also worried about you.  She agrees that you need to go to the emergency department to rule out heart attack, acute coronary syndrome, angina, pulmonary embolus, congestive heart failure.  You are stable to go by private vehicle because you have a normal EKG, but you need to have these things ruled out emergently.

## 2024-06-17 NOTE — ED Triage Notes (Signed)
 Pt to ED via POV. Pt states that she was seen at urgent care this morning. Pt states that she went in because she was having swelling in her left ankle and leg and also shortness of breath. Pt denies history of blood clots or recent travel. Pt states that she is only short of breath when walking. Pt denies cardiac history.

## 2024-06-18 NOTE — ED Provider Notes (Signed)
 [facility] Provider Note    Event Date/Time   First MD Initiated Contact with Patient 06/17/24 1056     (approximate)   History   Shortness of Breath and Leg Swelling   HPI  Lisa Valencia is a 63 y.o. female with a history of smoking, GERD, hypertension sent from urgent care for evaluation of dyspnea, lower extremity edema.  Reported to urgent care for chest discomfort although declines chest discomfort here     Physical Exam   Triage Vital Signs: ED Triage Vitals  Encounter Vitals Group     BP 06/17/24 1030 (!) 141/83     Girls Systolic BP Percentile --      Girls Diastolic BP Percentile --      Boys Systolic BP Percentile --      Boys Diastolic BP Percentile --      Pulse Rate 06/17/24 1030 75     Resp 06/17/24 1030 16     Temp 06/17/24 1030 98.5 F (36.9 C)     Temp Source 06/17/24 1030 Oral     SpO2 06/17/24 1030 97 %     Weight 06/17/24 1028 136.1 kg (300 lb)     Height 06/17/24 1028 1.803 m (5' 11)     Head Circumference --      Peak Flow --      Pain Score 06/17/24 1027 0     Pain Loc --      Pain Education --      Exclude from Growth Chart --     Most recent vital signs: Vitals:   06/17/24 1030  BP: (!) 141/83  Pulse: 75  Resp: 16  Temp: 98.5 F (36.9 C)  SpO2: 97%     General: Awake, no distress.  CV:  Good peripheral perfusion.  Regular rate and rhythm Resp:  Normal effort.  Scattered wheezing Abd:  No distention.  Other:  Mild edema bilaterally, appears equal   ED Results / Procedures / Treatments   Labs (all labs ordered are listed, but only abnormal results are displayed) Labs Reviewed  BASIC METABOLIC PANEL WITH GFR  CBC  PRO BRAIN NATRIURETIC PEPTIDE  TROPONIN T, HIGH SENSITIVITY     EKG  ED ECG REPORT I, Lamar Price, the attending physician, personally viewed and interpreted this ECG.  Date: 06/18/2024  Rhythm: normal sinus rhythm QRS Axis: normal Intervals: normal ST/T Wave  abnormalities: normal Narrative Interpretation: no evidence of acute ischemia    RADIOLOGY Chest x-ray viewed interpret by me, no acute abnormality    PROCEDURES:  Critical Care performed:   Procedures   MEDICATIONS ORDERED IN ED: Medications  ipratropium-albuterol  (DUONEB) 0.5-2.5 (3) MG/3ML nebulizer solution 3 mL (3 mLs Nebulization Given 06/17/24 1110)  ipratropium-albuterol  (DUONEB) 0.5-2.5 (3) MG/3ML nebulizer solution 3 mL (3 mLs Nebulization Given 06/17/24 1110)     IMPRESSION / MDM / ASSESSMENT AND PLAN / ED COURSE  I reviewed the triage vital signs and the nursing notes. Patient's presentation is most consistent with acute presentation with potential threat to life or bodily function.  Patient presents with dyspnea, primarily with exertion, lower extremity edema possibly left greater than right.  Differential includes CHF, COPD, pneumonia, doubt PE  On exam she has some scattered wheezing, will treat with DuoNebs.  Chest x-ray without evidence of pneumonia or pulmonary edema.  BNP is reassuring.  Ultrasound negative for DVT, her edema appears bilateral, and mild  Discussed obtaining CT angiography but the patient declined she is  ready for discharge.  Treat with steroids, albuterol , smoking cessation recommended, close outpatient follow-up, return precautions discussed, she agrees with this plan.        FINAL CLINICAL IMPRESSION(S) / ED DIAGNOSES   Final diagnoses:  Shortness of breath  Bronchospasm  Bilateral lower extremity edema     Rx / DC Orders   ED Discharge Orders          Ordered    predniSONE  (DELTASONE ) 50 MG tablet  Daily with breakfast        06/17/24 1313    albuterol  (VENTOLIN  HFA) 108 (90 Base) MCG/ACT inhaler  Every 6 hours PRN        06/17/24 1313             Note:  This document was prepared using Dragon voice recognition software and may include unintentional dictation errors.   Arlander Charleston, MD 06/18/24 [phone]

## 2024-06-27 NOTE — Progress Notes (Signed)
 RENITA BROCKS                                          MRN: [id number]   06/27/2024   The VBCI Quality Team Specialist reviewed this patient medical record for the purposes of chart review for care gap closure. The following were reviewed: abstraction for care gap closure-kidney health evaluation for diabetes:eGFR  and uACR.    VBCI Quality Team

## 2024-07-14 ENCOUNTER — Encounter: Payer: Self-pay | Admitting: Internal Medicine

## 2024-07-15 ENCOUNTER — Other Ambulatory Visit: Payer: Self-pay | Admitting: Internal Medicine

## 2024-07-15 MED ORDER — FUROSEMIDE 20 MG PO TABS: 20.0000 mg | ORAL_TABLET | Freq: Every day | ORAL | 0 refills | Status: DC | PRN

## 2024-07-15 NOTE — Telephone Encounter (Signed)
 Spoke with patient to get her scheduled for an appointment. Patient was offered our office with Rollene Northern, FNP on 07/17/24. Patient declines, as she states that is the only time she is available to spend time with her mother for a few hours. Patient was then offered to be seen at our sister practice [facility]. Patient declined stating she only wanted to see a provider that knows her and her situation, as this has been going on since around Thanksgiving. Patient was informed that Dr Marylynn has sent over the prescription Lasix. Patient states she will not be able to pick that up until Wednesday due to that live in [facility] and aren't near anything. Stressed to patient she really needed to be seen especially since she feels as if her visit to the ED wasn't handle or taken care. Patient is scheduled with Rollene Northern, FNP on 07/17/24 at 10:00 am. Verbalized understanding.

## 2024-07-17 ENCOUNTER — Encounter: Payer: Self-pay | Admitting: Family

## 2024-07-17 ENCOUNTER — Ambulatory Visit: Payer: Self-pay | Admitting: Family

## 2024-07-17 ENCOUNTER — Ambulatory Visit
Admission: RE | Admit: 2024-07-17 | Discharge: 2024-07-17 | Disposition: A | Discharge status: Home / Self Care | Source: Ambulatory Visit | Attending: Family | Admitting: Family

## 2024-07-17 ENCOUNTER — Ambulatory Visit: Admitting: Family

## 2024-07-17 VITALS — BP 130/72 | HR 80 | Temp 97.9°F | Ht 71.0 in | Wt 304.8 lb

## 2024-07-17 DIAGNOSIS — R0609 Other forms of dyspnea: Secondary | ICD-10-CM

## 2024-07-17 DIAGNOSIS — E039 Hypothyroidism, unspecified: Secondary | ICD-10-CM | POA: Diagnosis not present

## 2024-07-17 DIAGNOSIS — R0600 Dyspnea, unspecified: Secondary | ICD-10-CM | POA: Insufficient documentation

## 2024-07-17 LAB — BASIC METABOLIC PANEL WITH GFR
BUN: 14 mg/dL (ref 6–23)
CO2: 35 meq/L — ABNORMAL HIGH (ref 19–32)
Calcium: 9.4 mg/dL (ref 8.4–10.5)
Chloride: 97 meq/L (ref 96–112)
Creatinine, Ser: 0.72 mg/dL (ref 0.40–1.20)
GFR: 88.91 mL/min
Glucose, Bld: 95 mg/dL (ref 70–99)
Potassium: 3.8 meq/L (ref 3.5–5.1)
Sodium: 142 meq/L (ref 135–145)

## 2024-07-17 LAB — TSH: TSH: 2.91 u[IU]/mL (ref 0.35–5.50)

## 2024-07-17 MED ORDER — IOHEXOL 350 MG/ML SOLN
85.0000 mL | Freq: Once | INTRAVENOUS | Status: AC | PRN
  Administered 2024-07-17: 85 mL via INTRAVENOUS

## 2024-07-17 NOTE — Progress Notes (Signed)
 "  Assessment & Plan:  DOE (dyspnea on exertion) Assessment & Plan: D/D PE, COPD, deconditioning.  discussed risk for ASCVD Walking SaO2 remained 96%, heart rate 88-95 when walking.  She was not labored in speech as we walked in talk. left lower extremity edema nearly resolved.  Continue Lasix  20 mg daily for 2-3 more days and then advised to use prn.  CT angio ordered to r/u PE. Pending echocardiogram.  Referral to cardiology for ASCVD risk stratification.  Referral to pulmonology due to smoking history, concern for COPD, DOE, underlying sleep apnea. Consider trial of symbicort while we await cardiology, pulmonology evaluation.    Orders: -     TSH -     Pulmonary Visit -     ECHOCARDIOGRAM COMPLETE; Future -     Basic metabolic panel with GFR -     CT Angio Chest Pulmonary Embolism (PE) W or WO Contrast; Future -     Ambulatory referral to Cardiology  Hypothyroidism (acquired) -     TSH     Return precautions given.   Risks, benefits, and alternatives of the medications and treatment plan prescribed today were discussed, and patient expressed understanding.   Education regarding symptom management and diagnosis given to patient on AVS either electronically or printed.  No follow-ups on file.  Lisa Northern, FNP  Subjective:    Patient ID: Lisa Valencia, female    DOB: [birth year], 63 y.o.   MRN: [id number]  CC: Lisa Valencia is a 63 y.o. female who presents today for an acute visit.    HPI: HPI Discussed the use of AI scribe software for clinical note transcription with the patient, who gave verbal consent to proceed.  History of Present Illness   Lisa Valencia is a 63 year old female who presents for follow-up after an emergency room visit for shortness of breath and leg swelling.  She initially went to urgent care where an EKG was performed, and she was subsequently sent to the ER. In the ER, she received breathing treatments and a prednisone  taper of 50 mg for  five days. A chest x-ray showed no pneumonia, and an ultrasound of the left leg was negative for DVT  The swelling in her left leg began a couple of days before Thanksgiving 5 weeks ago and was significant enough to feel on the bottom of her foot when walking. The swelling was accompanied by a shiny appearance and tightness, with a bruised feeling around the ankles. She started taking Lasix  on December 30th, which has significantly reduced the swelling. Denies recent travel, history of blood clots, h/o  pulmonary embolism, cancer, or recent immobilization surgery. She had a knee replacement in April.  Her shortness of breath began around the same time as the leg swelling, right after Thanksgiving. It occurs with minimal exertion, such as rolling over in bed, bending over, walking short distances, bending over,  and after showering. She is a current smoker, having smoked since she was about 57 or 63 years old, and is working on quitting with the help of a patch. She reports a family history of heart disease, with her sister having had a heart catheterization and stent placement. Her father died from lung cancer.  She has a history of back and hip issues, having had both hips and a knee replaced, which has limited her physical activity. She describes herself as mostly sedentary, with some activity in her yard. She has been on spironolactone  as needed for blood  pressure. She uses albuterol  as needed for wheezing, which helps temporarily, but she has not needed it frequently in the past month.      Completed prednisone  50mg  5 days.  She uses albuterol  inhaler as needed for wheezing.  No wheezing today.  Denies increased cough, sputum production  No regular exercise  Reassuring  stress test , Dr darron 10/20/210  Seen the emergency room 06/17/2024 for shortness of breath , BLE edema Given duoneb nebulizer US  negative for DVT in left leg CXR without acute findings Troponin < 15 ProBNP 58 WBC 6.1 Crt  0.63    She started the lasix  20mg  yesterday and compression stockings with some relief  She is a smoker   history of hypertension, hypothyroidism Allergies: Sulfa antibiotics, Chantix  [varenicline ], Fexofenadine, Tramadol , Codeine sulfate, Fenofibrate , Pseudoephedrine hcl, and Wellbutrin [bupropion] Medications Ordered Prior to Encounter[1]  Review of Systems  Constitutional:  Negative for chills and fever.  Respiratory:  Positive for shortness of breath. Negative for cough.   Cardiovascular:  Positive for leg swelling (left leg). Negative for chest pain and palpitations.  Gastrointestinal:  Negative for nausea and vomiting.      Objective:    BP 130/72   Pulse 80   Temp 97.9 F (36.6 C) (Oral)   Ht 5' 11 (1.803 m)   Wt (!) 304 lb 12.8 oz (138.3 kg)   SpO2 96%   BMI 42.51 kg/m   BP Readings from Last 3 Encounters:  07/17/24 130/72  06/17/24 (!) 141/83  06/17/24 (!) 146/74   Wt Readings from Last 3 Encounters:  07/17/24 (!) 304 lb 12.8 oz (138.3 kg)  06/17/24 300 lb (136.1 kg)  06/17/24 300 lb (136.1 kg)    Physical Exam Vitals reviewed.  Constitutional:      Appearance: She is well-developed.  Eyes:     Conjunctiva/sclera: Conjunctivae normal.  Cardiovascular:     Rate and Rhythm: Normal rate and regular rhythm.     Pulses: Normal pulses.     Heart sounds: Normal heart sounds.  Pulmonary:     Effort: Pulmonary effort is normal.     Breath sounds: Normal breath sounds. No wheezing, rhonchi or rales.  Skin:    General: Skin is warm and dry.  Neurological:     Mental Status: She is alert.  Psychiatric:        Speech: Speech normal.        Behavior: Behavior normal.        Thought Content: Thought content normal.           [1]  Current Outpatient Medications on File Prior to Visit  Medication Sig Dispense Refill   acetaminophen  (TYLENOL ) 500 MG tablet Take 1,000 mg by mouth every 6 (six) hours as needed for moderate pain (pain score 4-6).      albuterol  (VENTOLIN  HFA) 108 (90 Base) MCG/ACT inhaler Inhale 2 puffs into the lungs every 6 (six) hours as needed for wheezing or shortness of breath. 8 g 2   aspirin  EC 81 MG tablet Take 1 tablet (81 mg total) by mouth daily.     celecoxib  (CELEBREX ) 200 MG capsule TAKE 1 CAPSULE BY MOUTH TWICE DAILY 180 capsule 0   cholecalciferol (VITAMIN D3) 25 MCG (1000 UNIT) tablet Take 2,000 Units by mouth once a week. Friday     cyanocobalamin  (VITAMIN B12) 500 MCG tablet Take 500 mcg by mouth daily.     fexofenadine-pseudoephedrine (ALLEGRA-D 24) 180-240 MG 24 hr tablet Take 1 tablet by mouth at  bedtime.     folic acid  (FOLVITE ) 1 MG tablet Take 1 tablet (1 mg total) by mouth daily. 90 tablet 2   furosemide  (LASIX ) 20 MG tablet Take 1 tablet (20 mg total) by mouth daily as needed. For fluid retention 30 tablet 0   levothyroxine  (SYNTHROID ) 112 MCG tablet Take 1 tablet (112 mcg total) by mouth See admin instructions. Take 112 mcg by mouth daily, except on Sunday take 224 mcg 102 tablet 0   methocarbamol  (ROBAXIN ) 500 MG tablet Take 500 mg by mouth every 6 (six) hours as needed for muscle spasms.     Omega-3 Fatty Acids (FISH OIL) 1000 MG CAPS Take 1,000 mg by mouth daily.      omeprazole  (PRILOSEC) 20 MG capsule TAKE 1 CAPSULE BY MOUTH TWICE DAILY BEFORE MEALS. 180 capsule 3   oxyCODONE  (OXY IR/ROXICODONE ) 5 MG immediate release tablet Take 1 tablet (5 mg total) by mouth every 4 (four) hours as needed for moderate pain (pain score 4-6) (pain score 4-6). 30 tablet 0   simvastatin  (ZOCOR ) 20 MG tablet Take 1 tablet (20 mg total) by mouth at bedtime. 90 tablet 3   spironolactone  (ALDACTONE ) 50 MG tablet TAKE 1 TABLET BY MOUTH DAILY GENERIC EQUIVALENT FOR ALDACTONE  90 tablet 3   vitamin E 200 UNIT capsule Take 200 Units by mouth daily.     No current facility-administered medications on file prior to visit.   "

## 2024-07-17 NOTE — Assessment & Plan Note (Addendum)
 D/D PE, COPD, deconditioning.  discussed risk for ASCVD Walking SaO2 remained 96%, heart rate 88-95 when walking.  She was not labored in speech as we walked in talk. left lower extremity edema nearly resolved.  Continue Lasix  20 mg daily for 2-3 more days and then advised to use prn.  CT angio ordered to r/u PE. Pending echocardiogram.  Referral to cardiology for ASCVD risk stratification.  Referral to pulmonology due to smoking history, concern for COPD, DOE, underlying sleep apnea. Consider trial of symbicort while we await cardiology, pulmonology evaluation.

## 2024-07-17 NOTE — Patient Instructions (Signed)
 Ct angio to rule out pulmonary embolism  Referral to pulmonology, cardiology  Continue lasix for 2-3 more days.  You do not need to take spironolactone  as needed while taking Lasix  Nice to meet you.

## 2024-07-29 ENCOUNTER — Other Ambulatory Visit: Payer: Self-pay | Admitting: Internal Medicine

## 2024-07-29 ENCOUNTER — Other Ambulatory Visit (INDEPENDENT_AMBULATORY_CARE_PROVIDER_SITE_OTHER)

## 2024-07-29 DIAGNOSIS — E782 Mixed hyperlipidemia: Secondary | ICD-10-CM

## 2024-07-29 DIAGNOSIS — K760 Fatty (change of) liver, not elsewhere classified: Secondary | ICD-10-CM

## 2024-07-29 DIAGNOSIS — R7303 Prediabetes: Secondary | ICD-10-CM

## 2024-07-29 DIAGNOSIS — E559 Vitamin D deficiency, unspecified: Secondary | ICD-10-CM | POA: Diagnosis not present

## 2024-07-29 DIAGNOSIS — I10 Essential (primary) hypertension: Secondary | ICD-10-CM | POA: Diagnosis not present

## 2024-07-29 DIAGNOSIS — E538 Deficiency of other specified B group vitamins: Secondary | ICD-10-CM | POA: Diagnosis not present

## 2024-07-29 DIAGNOSIS — E039 Hypothyroidism, unspecified: Secondary | ICD-10-CM

## 2024-07-29 LAB — B12 AND FOLATE PANEL
Folate: >23.7 ng/mL
Vitamin B-12: 516 pg/mL (ref 211–911)

## 2024-07-29 LAB — COMPREHENSIVE METABOLIC PANEL WITH GFR
ALT: 31 U/L (ref 3–35)
AST: 25 U/L (ref 5–37)
Albumin: 4.5 g/dL (ref 3.5–5.2)
Alkaline Phosphatase: 46 U/L (ref 39–117)
BUN: 14 mg/dL (ref 6–23)
CO2: 32 meq/L (ref 19–32)
Calcium: 9.6 mg/dL (ref 8.4–10.5)
Chloride: 99 meq/L (ref 96–112)
Creatinine, Ser: 0.7 mg/dL (ref 0.40–1.20)
GFR: 91.95 mL/min
Glucose, Bld: 102 mg/dL — ABNORMAL HIGH (ref 70–99)
Potassium: 4.1 meq/L (ref 3.5–5.1)
Sodium: 141 meq/L (ref 135–145)
Total Bilirubin: 0.5 mg/dL (ref 0.2–1.2)
Total Protein: 7.2 g/dL (ref 6.0–8.3)

## 2024-07-29 LAB — HEMOGLOBIN A1C: Hgb A1c MFr Bld: 6.4 % (ref 4.6–6.5)

## 2024-07-29 LAB — VITAMIN D 25 HYDROXY (VIT D DEFICIENCY, FRACTURES): VITD: 19.54 ng/mL — ABNORMAL LOW (ref 30.00–100.00)

## 2024-07-30 ENCOUNTER — Ambulatory Visit: Admitting: Student in an Organized Health Care Education/Training Program

## 2024-07-30 ENCOUNTER — Encounter: Payer: Self-pay | Admitting: Student in an Organized Health Care Education/Training Program

## 2024-07-30 VITALS — BP 134/86 | HR 86 | Temp 97.8°F | Ht 71.0 in | Wt 310.2 lb

## 2024-07-30 DIAGNOSIS — F17210 Nicotine dependence, cigarettes, uncomplicated: Secondary | ICD-10-CM

## 2024-07-30 DIAGNOSIS — E669 Obesity, unspecified: Secondary | ICD-10-CM

## 2024-07-30 DIAGNOSIS — Z6841 Body Mass Index (BMI) 40.0 and over, adult: Secondary | ICD-10-CM | POA: Diagnosis not present

## 2024-07-30 DIAGNOSIS — F17200 Nicotine dependence, unspecified, uncomplicated: Secondary | ICD-10-CM

## 2024-07-30 DIAGNOSIS — R0602 Shortness of breath: Secondary | ICD-10-CM | POA: Diagnosis not present

## 2024-07-30 LAB — NITRIC OXIDE: Nitric Oxide: 6

## 2024-07-30 MED ORDER — NICOTINE POLACRILEX 2 MG MT LOZG: 2.0000 mg | LOZENGE | OROMUCOSAL | 3 refills | Status: AC | PRN

## 2024-07-30 MED ORDER — NICOTINE 7 MG/24HR TD PT24: 7.0000 mg | MEDICATED_PATCH | TRANSDERMAL | 0 refills | Status: AC

## 2024-07-30 MED ORDER — NICOTINE 14 MG/24HR TD PT24: 14.0000 mg | MEDICATED_PATCH | TRANSDERMAL | 0 refills | Status: AC

## 2024-07-30 NOTE — Patient Instructions (Addendum)
 " VISIT SUMMARY: During your visit, we discussed your ongoing shortness of breath, wheezing, chest pressure, and leg swelling that have been present for the past two months. We reviewed your recent medical history, including the tests and treatments you have undergone. We also discussed your smoking history, weight gain, and overall health.  YOUR PLAN: -EVALUATION OF DYSPNEA AND LOWER EXTREMITY EDEMA: Your shortness of breath and leg swelling may be related to heart issues, even though initial tests did not show clear results. We have referred you to a cardiologist for further evaluation and are awaiting their recommendation on whether an echocardiogram is needed.  -SUSPECTED CHRONIC OBSTRUCTIVE PULMONARY DISEASE (COPD): Given your smoking history and symptoms, we suspect you may have COPD, a lung condition that makes it hard to breathe. We have ordered a pulmonary function test to assess your lung function and will follow up with you after the test results are available.  -NICOTINE  DEPENDENCE: You have a long history of smoking and are currently using nicotine  patches and have reduced your smoking. Quitting smoking is crucial for your lung health. We have prescribed a step-down plan for nicotine  patches and nicotine  lozenges to help manage cravings.  -OBESITY: Your recent weight gain may be contributing to your breathing difficulties by affecting your diaphragm and lung expansion. We will continue to monitor your weight and discuss potential strategies for weight management.  -SUSPECTED SLEEP APNEA: Your elevated CO2 levels suggest you may have sleep apnea, a condition where breathing repeatedly stops and starts during sleep. We have recommended a sleep study and referred you to a sleep specialist for further evaluation and management.  INSTRUCTIONS: Please follow up with the cardiologist as scheduled and await their recommendation on the echocardiogram. Complete the pulmonary function test as  ordered and attend the follow-up appointment to discuss the results. Continue using the nicotine  patches and lozenges as prescribed to help with smoking cessation. Monitor your weight and consider lifestyle changes to manage it. Schedule and complete the sleep study as recommended and follow up with the sleep specialist for further evaluation.    The [place]  Quitline: Call 1-800-QUIT-NOW (712-815-8817). The [place] [facility]uitline is a free service for [place]  residents. Trained counselors are available from 8 am until 3 am, 365 days per year. Services are available in both English and Spanish.   Web Resources Free online support programs can help you track your progress and share experiences with others who are quitting. These are examples: www.becomeanex.org www.trytostop.org  www.smokefree.gov  www.https://www.vargas.com/.aspx  [facility]: offers comprehensive in-person tobacco treatment counseling at [facility] ([address], [place] [address]).  Open to everyone. Virtual appointments available. Free parking. Call [phone] to schedule an appointment or [phone] for general information.    Tobacco Cessation Medications  Nicotine  Replacement Therapy (NRT)  Nicotine  is the addictive part of tobacco smoke, but not the most dangerous part. There are 7000 other toxins in cigarettes, including carbon monoxide, that cause disease. People do not generally become addicted to medication. Common problems: People don't use enough medication or stop too early. Medications are safe and effective. Overdose is very uncommon. Use medications as long as needed (3 months minimum). Some combinations work better than single medications. Long acting medications like the NRT patch and bupropion provide continuous treatment for withdrawal symptoms.  PLUS  Short acting medications like the NRT gum, lozenge, inhaler, and nasal spray  help people to cope with breakthrough cravings.  ? Nicotine  Patch  Place patch  on hairless skin on upper body, including arms and back. Each day: discard old patch, shower, apply new patch to a different site. Apply hydrocortisone cream to mildly red/irritated areas. Call provider if rash develops. If patch causes sleep disturbance, remove patch at bedtime and replace each morning after shower. Side effects may include: skin irritation, headache, insomnia, abnormal/vivid dreams.  ? Nicotine  Gum  Chew gum slowly, park in cheek when peppery taste or tingling sensation begins (about 15-30 chews). When taste or tingling goes away, begin chewing again. Use until nicotine  is gone (taste or tingle does not return, usually 30 minutes). Park in different areas of mouth. Nicotine  is absorbed through the lining of the mouth. Use enough to control cravings, up to 24 pieces per day (if used alone). Avoid eating or drinking for 15 minutes before using and during use. Side effects may include: mouth/jaw soreness, hiccups, indigestion, hypersalivation.  If gum is not chewed correctly, additional side effects may include lightheadedness, nausea/vomiting, throat and mouth irritation.  ? Nicotine  Lozenge  Allow to dissolve slowly in mouth (20-30 minutes). Do not chew or swallow. Nicotine  release may cause a warm tingling sensation. Occasionally rotate to different areas of the mouth. Use enough to control cravings, up to 20 lozenges per day (if used alone). Avoid eating or drinking for 15 minutes before using and during use. Side effects may include: nausea, hiccups, cough, heartburn, headache, gas, insomnia.  ? Nicotine  Nasal Spray Use 1 spray in each nostril (1 dose) and tilt head back for 1 minute. Do not sniff, swallow, or inhale through nose.  Use at least 8 doses (1 spray in each nostril) , up to 40 doses per day (if used alone). To reduce nasal irritation, spray on cotton swab and insert into  nose. Side effects may include: nasal and/or throat irritation (hot, peppery, or burning sensation), nasal irritation, tearing, sneezing, cough, headache.  ? Nicotine  Oral Inhaler (puffer) Inhale into the back of the throat or puff in short breaths. Do not inhale into the lungs.  Puff continuously for 20 minutes (about 80 puffs) until cartridge is empty. Change cartridge when it loses the burning in throat sensation (feels like air only). Open cartridges can be saved and used again within 24 hours. Use at least 6 and up to 16 cartridges per day (if used alone).  Avoid eating or drinking for 15 minutes before using and during use. Side effects may include: mouth and/or throat irritation, unpleasant taste, cough, nasal irritation, indigestion, hiccups, headache.  ? Chantix  (varenicline ) Days 1-3: Take one 0.5 mg white pill each morning for 3 days, one week before quit date. Days 4-7: Increase to one 0.5 mg white pill twice a day in morning and evening for 4 days.  On Day 8 (target quit date), increase to one 1 mg blue pill twice a day. Maintain this dose for a minimum of 3 months. Take with food and a full glass of water  to reduce nausea. Be sure that the two doses are at least 8 hours apart, but try to take second dose early in the evening (i.e. 6 pm) to avoid sleep problems. Common side effects include: nausea, insomnia, headache, abnormal/vivid dreams. Tell your doctor if you have any history of psychiatric illness prior to starting Chantix .  STOP taking CHANTIX  and contact a healthcare provider immediately if you experience agitation, hostility, depressed mood, changes in thoughts or behavior that are not typical for you, thinking about or attempting suicide, allergic or skin reactions including swelling,  rash, redness, or peeling of the skin.  For patients who have heart disease: Smoking is a major risk factor for cardiovascular disease, and Chantix  can help you quit smoking. Chantix  may  be associated with a small, increased risk of certain heart events in patients who have heart disease. If you have any new or worsening symptoms of heart disease while taking Chantix , such as shortness of breath or trouble breathing, new or worsening chest pain, or new or worsening pain in your legs when walking, call your doctor or get emergency medical help immediately.  ? Wellbutrin / Zyban (bupropion) Take one 150 mg pill each morning for 3 days, one week before target quit date. On Day 4, increase to one 150 mg pill twice a day, morning and evening.  Maintain this dose for a minimum of 3 months. Be sure that the two doses are at least 8 hours apart, but try to take second dose early in the evening (i.e. 6 pm) to avoid sleep problems. Avoid or minimize use of alcohol when taking this medication. Common side effects include: dry mouth, headache, insomnia, nausea, weight loss.  Risk of seizure is 07/998. STOP taking BUPROPION and contact a healthcare provider immediately if you experience agitation, hostility, depressed mood, changes in thoughts or behavior that are not typical for you, thinking about or attempting suicide, allergic or skin reactions including swelling, rash, redness, or peeling of the skin.                   Contains text generated by [employer].                                 Contains text generated by [employer].   "

## 2024-07-30 NOTE — Progress Notes (Signed)
 "  Synopsis: Referred in for shortness of breath by Lisa Rollene MATSU, FNP Assessment & Plan  #Shortness of Breath  Presents for the evaluation of shortness of breath, mostly with exertion, that is associated with lower extremity edema as well as report of cough and wheeze. A CT scan of the chest has ruled out PE or ILD, and symptoms appear to have only mildly improved with use of furosemide . She does have a significant smoking history, and COPD is on the differential. Other considerations include HFpEF as well as obesity. FENO in clinic today does not suggest t-helper cell 2 response, and overall presentation is of low suspicion for asthma. Previous CBC's did not show any anemia (hemoglobin of 13.8 on 06/17/24). TSH was 2.91 on 12/31, and re-ordered yesterday and result pending    Will obtain PFT's to assess the degree of obstruction, air trapping, and defect in diffusion capacity. She is also scheduled to be evaluated by cardiology and awaits their input prior to proceeding with echocardiogram.  Lab Results  Component Value Date   NITRICOXIDE 6 07/30/2024   This result suggests low (<25) Type 2 (T2) airway inflammation indicating a low likelihood of active T2-driven airway inflammation; reduced probability of response to inhaled corticosteroids.    - Pulmonary Function Test; Future - Follow up with cardiology as scheduled  #Tobacco use disorder  Long history of smoking, currently using nicotine  patches and reduced to six cigarettes per day. Previous use of Chantix  resulted in irritability. Smoking cessation is crucial for lung health. Counseled regarding the importance of smoking cessation. Will prescribe nicotine  lozenges to help with her effort.  - nicotine  polacrilex (NICOTINE  MINI) 2 MG lozenge; Take 1 lozenge (2 mg total) by mouth every 2 (two) hours as needed for smoking cessation.  Dispense: 72 lozenge; Refill: 3 - nicotine  (NICODERM CQ  - DOSED IN MG/24 HR) 7 mg/24hr patch; Place 1  patch (7 mg total) onto the skin daily for 14 days.  Dispense: 14 patch; Refill: 0 - nicotine  (NICODERM CQ  - DOSED IN MG/24 HOURS) 14 mg/24hr patch; Place 1 patch (14 mg total) onto the skin daily for 14 days.  Dispense: 14 patch; Refill: 0  #Obesity  Weight gain of 13 pounds over two weeks, possibly contributing to dyspnea. Previous weight loss with Ozempic , but weight regained after discontinuation (with thyroid  follicular neoplasm noted). Counseled on weight loss.  #Suspected sleep apnea  Patient certainly at risk for sleep apnea, and presence of elevated bicarb on BMP could suggest OSA/OHS. Will refer to sleep medicine for further evaluation.   - Referred to sleep specialist for further evaluation and management.   Return in about 2 months (around 09/27/2024).  Lisa November, MD [facility]  I spent 60 minutes caring for this patient today, including preparing to see the patient, obtaining a medical history , reviewing a separately obtained history, performing a medically appropriate examination and/or evaluation, counseling and educating the patient/family/caregiver, ordering medications, tests, or procedures, documenting clinical information in the electronic health record, and independently interpreting results (not separately reported/billed) and communicating results to the patient/family/caregiver  End of visit medications:  Meds ordered this encounter  Medications   nicotine  polacrilex (NICOTINE  MINI) 2 MG lozenge    Sig: Take 1 lozenge (2 mg total) by mouth every 2 (two) hours as needed for smoking cessation.    Dispense:  72 lozenge    Refill:  3   nicotine  (NICODERM CQ  - DOSED IN MG/24 HR) 7 mg/24hr patch  Sig: Place 1 patch (7 mg total) onto the skin daily for 14 days.    Dispense:  14 patch    Refill:  0   nicotine  (NICODERM CQ  - DOSED IN MG/24 HOURS) 14 mg/24hr patch    Sig: Place 1 patch (14 mg total) onto the skin daily for 14 days.     Dispense:  14 patch    Refill:  0    Current Medications[1]   Subjective:   PATIENT ID: Lisa Valencia GENDER: female DOB: 24-Apr-1961, MRN: [id number]  Chief Complaint  Patient presents with   Shortness of Breath    DOE. Edema. Wheezing. Occasional dry cough. ED on 06/17/2024 for SOB. Symptoms since Thanksgiving.  Albuterol - PRN    HPI  Discussed the use of AI scribe software for clinical note transcription with the patient, who gave verbal consent to proceed.  History of Present Illness  Lisa Valencia is a 64 year old female who presents with shortness of breath.  She has been experiencing shortness of breath for approximately two months, starting just before Thanksgiving. This is accompanied by wheezing, chest pressure, and lower extremity edema, particularly in her left leg. The symptoms are more pronounced at night. She occasionally uses a Ventolin  (albuterol ) inhaler, which provides short-term relief. She has also experienced a significant weight gain of 10-13 pounds over one to two weeks.  She initially sought care at urgent care on December 1st and was referred to the emergency room, where she underwent a series of tests including a duplex of the lower extremities and a chest xray. She was prescribed a five-day course of prednisone  and Ventolin  inhaler, but did not notice any improvement with prednisone . She later contacted her primary care provider, who prescribed Lasix  20 mg once daily, which helps reduce the edema when taken in the morning.  She was seen by her PCP on 12/31 and a CT scan of the chest was ordered, ruling out PE. She was then referred to our clinic for further workup of respiratory symptoms.  Her past medical history includes a knee replacement in April of 2025, after which she has not regained her energy levels. She has also had both hips replaced and thyroid  nodules (follicular neoplasm) s/p thyroidectomy Valencia of 2024. She has a history of using Ozempic   for weight loss, which she discontinued after the thyroid  growth was noted.  Socially, she is retired and spends most of her time at home, attending Bible study and playing bingo monthly. She has a smoking history starting at age 73, with a current reduction to six cigarettes per day while using a 21 mg nicotine  patch for the past three weeks. She has no personal or family history of asthma or significant respiratory illnesses. She has around 35 pack years of smoking history.  In the review of symptoms, she denies any recent illness, cold, upper respiratory infection, COVID, or flu. She reports being tired since her knee replacement and has not experienced shortness of breath with significant activity prior to the onset of her current symptoms. No history of asthma or wheezing in her past.   Ancillary information including prior medications, full medical/surgical/family/social histories, and PFTs (when available) are listed below and have been reviewed.   Review of Systems  Constitutional:  Positive for malaise/fatigue. Negative for chills, fever and weight loss.  Respiratory:  Positive for cough, shortness of breath and wheezing. Negative for hemoptysis and sputum production.   Cardiovascular:  Negative for chest pain.  Objective:   Vitals:   07/30/24 0836  BP: 134/86  Pulse: 86  Temp: 97.8 F (36.6 C)  SpO2: 97%  Weight: (!) 310 lb 3.2 oz (140.7 kg)  Height: 5' 11 (1.803 m)   97% on RA  BMI Readings from Last 3 Encounters:  07/30/24 43.26 kg/m  07/17/24 42.51 kg/m  06/17/24 41.84 kg/m   Wt Readings from Last 3 Encounters:  07/30/24 (!) 310 lb 3.2 oz (140.7 kg)  07/17/24 (!) 304 lb 12.8 oz (138.3 kg)  06/17/24 300 lb (136.1 kg)    .vitalsmbmi  Physical Exam Constitutional:      Appearance: Normal appearance. She is well-developed. She is obese.  Cardiovascular:     Rate and Rhythm: Normal rate and regular rhythm.     Pulses: Normal pulses.     Heart sounds:  Normal heart sounds.  Pulmonary:     Effort: Pulmonary effort is normal.     Breath sounds: Normal breath sounds.  Neurological:     General: No focal deficit present.     Mental Status: She is alert and oriented to person, place, and time. Mental status is at baseline.       Ancillary Information    Past Medical History:  Diagnosis Date   Allergy 2003   Anxiety    Arthritis    Depression 2001   Diverticulosis of intestine 2010   Esophagitis, reflux 2010   GERD (gastroesophageal reflux disease)    Hepatic steatosis    HLD (hyperlipidemia)    HTN (hypertension)    Hypothyroidism    Impaired fasting glucose 01/25/2012   Needs eye exam.     Long-term use of aspirin  therapy    Pre-diabetes      Family History  Problem Relation Age of Onset   Cancer Father        lung ca   Hyperlipidemia Father        PAD   Diabetes Sister    Coronary artery disease Sister 32   Diabetes Maternal Grandfather    Cancer Maternal Grandfather        colon ca   Cancer Paternal Grandmother        colon   Cancer Paternal Grandfather        colon ca   Cancer Paternal Aunt        breast   Breast cancer Paternal Aunt 30   Heart disease Neg Hx      Past Surgical History:  Procedure Laterality Date   ABDOMINAL HYSTERECTOMY  2009   supracervical    APPENDECTOMY  1985   BREAST BIOPSY Right 01/26/2022   papilloma   BREAST CYST ASPIRATION Right    rt fna-neg   COLONOSCOPY WITH PROPOFOL  N/A 06/05/2015   Procedure: COLONOSCOPY WITH PROPOFOL ;  Surgeon: Rogelia Copping, MD;  Location: [facility];  Service: Endoscopy;  Laterality: N/A;  Pt requests early. Pt for antibiotic pre-procedure  CECAL PROTRUSION BX. / WITH CLIP LOT # [id number] RESOLUTION 360 EXP 03/03/2016 SIGMOID POLYP   JOINT REPLACEMENT Right 2012 2019   hip, [name]   KNEE ARTHROPLASTY Right 10/18/2023   Procedure: ARTHROPLASTY, KNEE, TOTAL, USING IMAGELESS COMPUTER-ASSISTED NAVIGATION;  Surgeon: Mardee Lynwood SQUIBB, MD;   Location: [facility];  Service: Orthopedics;  Laterality: Right;   THYROIDECTOMY     TOTAL HIP ARTHROPLASTY Left 05/02/2018   Procedure: TOTAL HIP ARTHROPLASTY;  Surgeon: Mardee Lynwood SQUIBB, MD;  Location: [facility];  Service: Orthopedics;  Laterality: Left;    Social History  Socioeconomic History   Marital status: Married    Spouse name: david   Number of children: Not on file   Years of education: Not on file   Highest education level: Associate degree: academic program  Occupational History   Occupation:  [employer] .was in Licensed Conveyancer: [place]    Comment: has applied for disability  Tobacco Use   Smoking status: Every Day    Current packs/day: 0.25    Average packs/day: 0.3 packs/day for 35.0 years (8.8 ttl pk-yrs)    Types: Cigarettes   Smokeless tobacco: Never   Tobacco comments:    Smokes 6 cigarettes daily- khj 07/30/2024        Started smoking at 64 years old.    Smoked 1 PPD at her heaviest.  Vaping Use   Vaping status: Never Used  Substance and Sexual Activity   Alcohol use: Not Currently   Drug use: No   Sexual activity: Not on file  Other Topics Concern   Not on file  Social History Narrative   Not on file   Social Drivers of Health   Tobacco Use: High Risk (07/30/2024)   Patient History    Smoking Tobacco Use: Every Day    Smokeless Tobacco Use: Never    Passive Exposure: Not on file  Financial Resource Strain: Low Risk  (01/16/2024)   [facility]   Overall Financial Resource Strain (CARDIA)    Difficulty of Paying Living Expenses: Not hard at all  Food Insecurity: No Food Insecurity (01/16/2024)   Received from [facility]   Epic    Within the past 12 months, you worried that your food would run out before you got the money to buy more.: Never true    Within the past 12 months, the food you bought just didn't last and you didn't have money to get more.: Never true  Transportation Needs: No  Transportation Needs (01/16/2024)   Received from [facility] - Transportation    In the past 12 months, has lack of transportation kept you from medical appointments or from getting medications?: No    Lack of Transportation (Non-Medical): No  Physical Activity: Insufficiently Active (10/09/2023)   Exercise Vital Sign    Days of Exercise per Week: 3 days    Minutes of Exercise per Session: 20 min  Stress: No Stress Concern Present (10/09/2023)   [employer] of Occupational [employer] - Occupational Stress Questionnaire    Feeling of Stress : Not at all  Social Connections: Socially Integrated (10/09/2023)   Social Connection and Isolation Panel    Frequency of Communication with Friends and Family: Three times a week    Frequency of Social Gatherings with Friends and Family: Three times a week    Attends Religious Services: More than 4 times per year    Active Member of Clubs or Organizations: Yes    Attends Banker Meetings: 1 to 4 times per year    Marital Status: Married  Catering Manager Violence: Not At Risk (10/18/2023)   Humiliation, Afraid, Rape, and Kick questionnaire    Fear of Current or Ex-Partner: No    Emotionally Abused: No    Physically Abused: No    Sexually Abused: No  Depression (PHQ2-9): Low Risk (07/17/2024)   Depression (PHQ2-9)    PHQ-2 Score: 0  Alcohol Screen: Low Risk (10/09/2023)   Alcohol Screen    Last Alcohol Screening Score (  AUDIT): 0  Housing: Low Risk  (01/16/2024)   Received from [facility]   Epic    In the last 12 months, was there a time when you were not able to pay the mortgage or rent on time?: No    In the past 12 months, how many times have you moved where you were living?: 0    At any time in the past 12 months, were you homeless or living in a shelter (including now)?: No  Recent Concern: Housing - High Risk (10/18/2023)   Housing Stability Vital Sign    Unable to Pay for Housing in  the Last Year: Yes    Number of Times Moved in the Last Year: 0    Homeless in the Last Year: No  Utilities: Not At Risk (01/16/2024)   Received from [facility]   Epic    In the past 12 months has the electric, gas, oil, or water  company threatened to shut off services in your home?: No  Health Literacy: Adequate Health Literacy (10/09/2023)   B1300 Health Literacy    Frequency of need for help with medical instructions: Never     Allergies[2]   CBC    Component Value Date/Time   WBC 6.1 06/17/2024 1029   RBC 4.93 06/17/2024 1029   HGB 13.8 06/17/2024 1029   HCT 42.3 06/17/2024 1029   PLT 176 06/17/2024 1029   MCV 85.8 06/17/2024 1029   MCH 28.0 06/17/2024 1029   MCHC 32.6 06/17/2024 1029   RDW 14.6 06/17/2024 1029   LYMPHSABS 1.8 06/22/2023 1021   MONOABS 0.5 06/22/2023 1021   EOSABS 0.1 06/22/2023 1021   BASOSABS 0.0 06/22/2023 1021    Pulmonary Functions Testing Results:     No data to display          Outpatient Medications Prior to Visit  Medication Sig Dispense Refill   acetaminophen  (TYLENOL ) 500 MG tablet Take 1,000 mg by mouth every 6 (six) hours as needed for moderate pain (pain score 4-6).     albuterol  (VENTOLIN  HFA) 108 (90 Base) MCG/ACT inhaler Inhale 2 puffs into the lungs every 6 (six) hours as needed for wheezing or shortness of breath. 8 g 2   aspirin  EC 81 MG tablet Take 1 tablet (81 mg total) by mouth daily.     celecoxib  (CELEBREX ) 200 MG capsule TAKE 1 CAPSULE BY MOUTH TWICE DAILY 180 capsule 0   cholecalciferol (VITAMIN D3) 25 MCG (1000 UNIT) tablet Take 2,000 Units by mouth once a week. Friday     cyanocobalamin  (VITAMIN B12) 500 MCG tablet Take 500 mcg by mouth daily.     fexofenadine-pseudoephedrine (ALLEGRA-D 24) 180-240 MG 24 hr tablet Take 1 tablet by mouth at bedtime.     folic acid  (FOLVITE ) 1 MG tablet Take 1 tablet (1 mg total) by mouth daily. 90 tablet 2   furosemide  (LASIX ) 20 MG tablet Take 1 tablet (20 mg total) by  mouth daily as needed. For fluid retention 30 tablet 0   levothyroxine  (SYNTHROID ) 112 MCG tablet Take 1 tablet (112 mcg total) by mouth See admin instructions. Take 112 mcg by mouth daily, except on Sunday take 224 mcg 102 tablet 0   methocarbamol  (ROBAXIN ) 500 MG tablet Take 500 mg by mouth every 6 (six) hours as needed for muscle spasms.     Omega-3 Fatty Acids (FISH OIL) 1000 MG CAPS Take 1,000 mg by mouth daily.      omeprazole  (PRILOSEC) 20  MG capsule TAKE 1 CAPSULE BY MOUTH TWICE DAILY BEFORE MEALS. 180 capsule 3   oxyCODONE  (OXY IR/ROXICODONE ) 5 MG immediate release tablet Take 1 tablet (5 mg total) by mouth every 4 (four) hours as needed for moderate pain (pain score 4-6) (pain score 4-6). 30 tablet 0   simvastatin  (ZOCOR ) 20 MG tablet TAKE 1 TABLET BY MOUTH AT BEDTIME 90 tablet 3   spironolactone  (ALDACTONE ) 50 MG tablet TAKE 1 TABLET BY MOUTH DAILY GENERIC EQUIVALENT FOR ALDACTONE  90 tablet 3   vitamin E 200 UNIT capsule Take 200 Units by mouth daily.     No facility-administered medications prior to visit.      [1]  Current Outpatient Medications:    acetaminophen  (TYLENOL ) 500 MG tablet, Take 1,000 mg by mouth every 6 (six) hours as needed for moderate pain (pain score 4-6)., Disp: , Rfl:    albuterol  (VENTOLIN  HFA) 108 (90 Base) MCG/ACT inhaler, Inhale 2 puffs into the lungs every 6 (six) hours as needed for wheezing or shortness of breath., Disp: 8 g, Rfl: 2   aspirin  EC 81 MG tablet, Take 1 tablet (81 mg total) by mouth daily., Disp: , Rfl:    celecoxib  (CELEBREX ) 200 MG capsule, TAKE 1 CAPSULE BY MOUTH TWICE DAILY, Disp: 180 capsule, Rfl: 0   cholecalciferol (VITAMIN D3) 25 MCG (1000 UNIT) tablet, Take 2,000 Units by mouth once a week. Friday, Disp: , Rfl:    cyanocobalamin  (VITAMIN B12) 500 MCG tablet, Take 500 mcg by mouth daily., Disp: , Rfl:    fexofenadine-pseudoephedrine (ALLEGRA-D 24) 180-240 MG 24 hr tablet, Take 1 tablet by mouth at bedtime., Disp: , Rfl:    folic  acid (FOLVITE ) 1 MG tablet, Take 1 tablet (1 mg total) by mouth daily., Disp: 90 tablet, Rfl: 2   furosemide  (LASIX ) 20 MG tablet, Take 1 tablet (20 mg total) by mouth daily as needed. For fluid retention, Disp: 30 tablet, Rfl: 0   levothyroxine  (SYNTHROID ) 112 MCG tablet, Take 1 tablet (112 mcg total) by mouth See admin instructions. Take 112 mcg by mouth daily, except on Sunday take 224 mcg, Disp: 102 tablet, Rfl: 0   methocarbamol  (ROBAXIN ) 500 MG tablet, Take 500 mg by mouth every 6 (six) hours as needed for muscle spasms., Disp: , Rfl:    nicotine  (NICODERM CQ  - DOSED IN MG/24 HOURS) 14 mg/24hr patch, Place 1 patch (14 mg total) onto the skin daily for 14 days., Disp: 14 patch, Rfl: 0   nicotine  (NICODERM CQ  - DOSED IN MG/24 HR) 7 mg/24hr patch, Place 1 patch (7 mg total) onto the skin daily for 14 days., Disp: 14 patch, Rfl: 0   nicotine  polacrilex (NICOTINE  MINI) 2 MG lozenge, Take 1 lozenge (2 mg total) by mouth every 2 (two) hours as needed for smoking cessation., Disp: 72 lozenge, Rfl: 3   Omega-3 Fatty Acids (FISH OIL) 1000 MG CAPS, Take 1,000 mg by mouth daily. , Disp: , Rfl:    omeprazole  (PRILOSEC) 20 MG capsule, TAKE 1 CAPSULE BY MOUTH TWICE DAILY BEFORE MEALS., Disp: 180 capsule, Rfl: 3   oxyCODONE  (OXY IR/ROXICODONE ) 5 MG immediate release tablet, Take 1 tablet (5 mg total) by mouth every 4 (four) hours as needed for moderate pain (pain score 4-6) (pain score 4-6)., Disp: 30 tablet, Rfl: 0   simvastatin  (ZOCOR ) 20 MG tablet, TAKE 1 TABLET BY MOUTH AT BEDTIME, Disp: 90 tablet, Rfl: 3   spironolactone  (ALDACTONE ) 50 MG tablet, TAKE 1 TABLET BY MOUTH DAILY GENERIC EQUIVALENT FOR  ALDACTONE , Disp: 90 tablet, Rfl: 3   vitamin E 200 UNIT capsule, Take 200 Units by mouth daily., Disp: , Rfl:  [2]  Allergies Allergen Reactions   Sulfa Antibiotics Swelling and Other (See Comments)    Angioedema THROAT AND TONGUE HAD SWELLING   Chantix  [Varenicline ] Nausea Only   Tramadol  Itching   Codeine  Sulfate Nausea Only   Fenofibrate  Other (See Comments)    Muscle aches   Pseudoephedrine Hcl Palpitations   Wellbutrin [Bupropion] Hives   "

## 2024-07-31 ENCOUNTER — Encounter: Payer: Self-pay | Admitting: Cardiovascular Disease

## 2024-07-31 ENCOUNTER — Ambulatory Visit: Attending: Cardiovascular Disease | Admitting: Cardiovascular Disease

## 2024-07-31 ENCOUNTER — Ambulatory Visit: Admitting: Internal Medicine

## 2024-07-31 VITALS — BP 118/68 | HR 91 | Temp 97.9°F | Ht 71.0 in | Wt 308.8 lb

## 2024-07-31 VITALS — BP 118/74 | HR 73 | Ht 71.0 in | Wt 308.5 lb

## 2024-07-31 DIAGNOSIS — I10 Essential (primary) hypertension: Secondary | ICD-10-CM

## 2024-07-31 DIAGNOSIS — E039 Hypothyroidism, unspecified: Secondary | ICD-10-CM

## 2024-07-31 DIAGNOSIS — E785 Hyperlipidemia, unspecified: Secondary | ICD-10-CM | POA: Diagnosis not present

## 2024-07-31 DIAGNOSIS — Z720 Tobacco use: Secondary | ICD-10-CM

## 2024-07-31 DIAGNOSIS — R0600 Dyspnea, unspecified: Secondary | ICD-10-CM | POA: Diagnosis not present

## 2024-07-31 DIAGNOSIS — E119 Type 2 diabetes mellitus without complications: Secondary | ICD-10-CM | POA: Insufficient documentation

## 2024-07-31 DIAGNOSIS — R0609 Other forms of dyspnea: Secondary | ICD-10-CM | POA: Diagnosis not present

## 2024-07-31 DIAGNOSIS — J449 Chronic obstructive pulmonary disease, unspecified: Secondary | ICD-10-CM

## 2024-07-31 DIAGNOSIS — R0689 Other abnormalities of breathing: Secondary | ICD-10-CM | POA: Diagnosis not present

## 2024-07-31 DIAGNOSIS — R072 Precordial pain: Secondary | ICD-10-CM | POA: Diagnosis not present

## 2024-07-31 DIAGNOSIS — J441 Chronic obstructive pulmonary disease with (acute) exacerbation: Secondary | ICD-10-CM

## 2024-07-31 MED ORDER — LEVOTHYROXINE SODIUM 125 MCG PO TABS: 125.0000 ug | ORAL_TABLET | Freq: Every day | ORAL | 3 refills | Status: DC

## 2024-07-31 MED ORDER — ERGOCALCIFEROL 1.25 MG (50000 UT) PO CAPS: 50000.0000 [IU] | ORAL_CAPSULE | ORAL | 0 refills | Status: DC

## 2024-07-31 MED ORDER — FUROSEMIDE 20 MG PO TABS: 20.0000 mg | ORAL_TABLET | Freq: Every day | ORAL | 0 refills | Status: DC | PRN

## 2024-07-31 MED ORDER — CELECOXIB 200 MG PO CAPS: 200.0000 mg | ORAL_CAPSULE | Freq: Two times a day (BID) | ORAL | 0 refills | Status: DC

## 2024-07-31 MED ORDER — FOLIC ACID 1 MG PO TABS: 1.0000 mg | ORAL_TABLET | Freq: Every day | ORAL | 2 refills | Status: AC

## 2024-07-31 MED ORDER — METOPROLOL TARTRATE 50 MG PO TABS: ORAL_TABLET | ORAL | 0 refills | Status: AC

## 2024-07-31 NOTE — Patient Instructions (Addendum)
 Do not resume spironolactone . BP IS FINE  GOAL IS 130/80 OR LESS   Consider resuming  OZEMPIC   OR MOUNJARO.     THYROID :   Continue  112 mcg levothyroxine  dose 6 days per week and 224 1 day per week until you are done with that supply .  New rx for 125  mcg daily  AFTER YOU FINISH  THE 112 MCG SUPPLY  LOW VITAMIN D :  I AM PRESCRIBING A MEGADOSE TO TAKE ONCE A WEEK FOR THE NEXT 3 MONTHS. AFTER THAT,,  RESUME 2000 IUS daily

## 2024-07-31 NOTE — Assessment & Plan Note (Signed)
 Still smoking despite experiencing shortness of breath

## 2024-07-31 NOTE — Progress Notes (Signed)
 "  Subjective:  Patient ID: Lisa Valencia, female    DOB: 1960/11/05  Age: 64 y.o. MRN: [id number]  CC: The primary encounter diagnosis was Tobacco abuse. Diagnoses of Type 2 diabetes mellitus without complication, without long-term current use of insulin  (HCC), Dyspnea and respiratory abnormalities, COPD exacerbation (HCC), Hypothyroidism (acquired), and Morbid obesity (HCC) were also pertinent to this visit.   HPI Lisa Valencia presents for  Chief Complaint  Patient presents with   Hypothyroidism   Hypertension   Hyperlipidemia   Medication Management    Recently evaluated in ER and by NP Arnett for dyspnea with exertion.  PE ruled out.  Saw pulmonology yesterday,  PFTs ordered.  Sees cardiology today for rule out diastolic dysfunction given elevated blood pressure.   She has had episodes of rapid heart beat. Sleep study also ordered/ in progress with  Dr Jess referral   HTN: telmisartan  was suspended after her hospitalization for TKR right knee  in April due to hypotension,  has been taking only spironolactone .  Was advised to resume telmisartan  in July if needed but home readings reviewed today have been < 140/90 . Diastolics have been > 80.  Has suspended spironolactone  daily ,  using furosemide  daily for persistent LE edema. Left > right   Tobacco abuse  she has reduced  cigs to 6 daily from 1pack daily  over the last 2 week form 1 pack daily using nicotine  patches 21 mcg .    Edema:  resolved in ankle with use of lasix   left side .  History of hip replacement 2019    Outpatient Medications Prior to Visit  Medication Sig Dispense Refill   acetaminophen  (TYLENOL ) 500 MG tablet Take 1,000 mg by mouth every 6 (six) hours as needed for moderate pain (pain score 4-6).     albuterol  (VENTOLIN  HFA) 108 (90 Base) MCG/ACT inhaler Inhale 2 puffs into the lungs every 6 (six) hours as needed for wheezing or shortness of breath. 8 g 2   aspirin  EC 81 MG tablet Take 1 tablet (81 mg total) by  mouth daily.     cholecalciferol (VITAMIN D3) 25 MCG (1000 UNIT) tablet Take 2,000 Units by mouth once a week. Friday (Patient taking differently: Take 2,000 Units by mouth once a week.)     cyanocobalamin  (VITAMIN B12) 500 MCG tablet Take 500 mcg by mouth daily.     fexofenadine-pseudoephedrine (ALLEGRA-D 24) 180-240 MG 24 hr tablet Take 1 tablet by mouth at bedtime. (Patient taking differently: Take 1 tablet by mouth in the morning.)     methocarbamol  (ROBAXIN ) 500 MG tablet Take 500 mg by mouth every 6 (six) hours as needed for muscle spasms.     nicotine  (NICODERM CQ  - DOSED IN MG/24 HOURS) 14 mg/24hr patch Place 1 patch (14 mg total) onto the skin daily for 14 days. 14 patch 0   nicotine  (NICODERM CQ  - DOSED IN MG/24 HR) 7 mg/24hr patch Place 1 patch (7 mg total) onto the skin daily for 14 days. 14 patch 0   nicotine  polacrilex (NICOTINE  MINI) 2 MG lozenge Take 1 lozenge (2 mg total) by mouth every 2 (two) hours as needed for smoking cessation. 72 lozenge 3   Omega-3 Fatty Acids (FISH OIL) 1000 MG CAPS Take 1,000 mg by mouth daily.      omeprazole  (PRILOSEC) 20 MG capsule TAKE 1 CAPSULE BY MOUTH TWICE DAILY BEFORE MEALS. 180 capsule 3   simvastatin  (ZOCOR ) 20 MG tablet TAKE 1 TABLET BY MOUTH  AT BEDTIME 90 tablet 3   vitamin E 200 UNIT capsule Take 200 Units by mouth daily.     levothyroxine  (SYNTHROID ) 112 MCG tablet Take 1 tablet (112 mcg total) by mouth See admin instructions. Take 112 mcg by mouth daily, except on Sunday take 224 mcg 102 tablet 0   oxyCODONE  (OXY IR/ROXICODONE ) 5 MG immediate release tablet Take 1 tablet (5 mg total) by mouth every 4 (four) hours as needed for moderate pain (pain score 4-6) (pain score 4-6). 30 tablet 0   spironolactone  (ALDACTONE ) 50 MG tablet TAKE 1 TABLET BY MOUTH DAILY GENERIC EQUIVALENT FOR ALDACTONE  90 tablet 3   celecoxib  (CELEBREX ) 200 MG capsule TAKE 1 CAPSULE BY MOUTH TWICE DAILY 180 capsule 0   folic acid  (FOLVITE ) 1 MG tablet Take 1 tablet (1 mg  total) by mouth daily. 90 tablet 2   furosemide  (LASIX ) 20 MG tablet Take 1 tablet (20 mg total) by mouth daily as needed. For fluid retention 30 tablet 0   No facility-administered medications prior to visit.    Review of Systems;  Patient denies headache, fevers, malaise, unintentional weight loss, skin rash, eye pain, sinus congestion and sinus pain, sore throat, dysphagia,  hemoptysis , cough, dyspnea, wheezing, chest pain, palpitations, orthopnea, edema, abdominal pain, nausea, melena, diarrhea, constipation, flank pain, dysuria, hematuria, urinary  Frequency, nocturia, numbness, tingling, seizures,  Focal weakness, Loss of consciousness,  Tremor, insomnia, depression, anxiety, and suicidal ideation.      Objective:  BP 118/68 (BP Location: Left Arm, Patient Position: Sitting)   Pulse 91   Temp 97.9 F (36.6 C) (Oral)   Ht 5' 11 (1.803 m)   Wt (!) 308 lb 12.8 oz (140.1 kg)   SpO2 93%   BMI 43.07 kg/m   BP Readings from Last 3 Encounters:  07/31/24 118/74  07/31/24 118/68  07/30/24 134/86    Wt Readings from Last 3 Encounters:  07/31/24 (!) 308 lb 8 oz (139.9 kg)  07/31/24 (!) 308 lb 12.8 oz (140.1 kg)  07/30/24 (!) 310 lb 3.2 oz (140.7 kg)    Physical Exam Vitals reviewed.  Constitutional:      General: She is not in acute distress.    Appearance: Normal appearance. She is obese. She is not ill-appearing, toxic-appearing or diaphoretic.  HENT:     Head: Normocephalic.  Eyes:     General: No scleral icterus.       Right eye: No discharge.        Left eye: No discharge.     Conjunctiva/sclera: Conjunctivae normal.  Cardiovascular:     Rate and Rhythm: Normal rate and regular rhythm.     Heart sounds: Normal heart sounds.  Pulmonary:     Effort: Pulmonary effort is normal. No respiratory distress.     Breath sounds: Normal breath sounds.  Musculoskeletal:        General: Normal range of motion.  Skin:    General: Skin is warm and dry.  Neurological:      General: No focal deficit present.     Mental Status: She is alert and oriented to person, place, and time. Mental status is at baseline.  Psychiatric:        Mood and Affect: Mood normal.        Behavior: Behavior normal.        Thought Content: Thought content normal.        Judgment: Judgment normal.     Lab Results  Component Value Date  HGBA1C 6.4 07/29/2024   HGBA1C 6.3 01/16/2024   HGBA1C 6.1 (H) 10/09/2023    Lab Results  Component Value Date   CREATININE 0.70 07/29/2024   CREATININE 0.72 07/17/2024   CREATININE 0.63 06/17/2024    Lab Results  Component Value Date   WBC 6.1 06/17/2024   HGB 13.8 06/17/2024   HCT 42.3 06/17/2024   PLT 176 06/17/2024   GLUCOSE 102 (H) 07/29/2024   CHOL 175 01/16/2024   TRIG 189.0 (H) 01/16/2024   HDL 44.50 01/16/2024   LDLDIRECT 118.0 06/22/2023   LDLCALC 93 01/16/2024   ALT 31 07/29/2024   AST 25 07/29/2024   NA 141 07/29/2024   K 4.1 07/29/2024   CL 99 07/29/2024   CREATININE 0.70 07/29/2024   BUN 14 07/29/2024   CO2 32 07/29/2024   TSH 2.91 07/17/2024   INR 0.90 04/19/2018   HGBA1C 6.4 07/29/2024   MICROALBUR 0.9 01/16/2024    CT Angio Chest W/Cm &/Or Wo Cm Result Date: 07/17/2024 EXAM: CTA CHEST 07/17/2024 02:15:32 PM TECHNIQUE: CTA of the chest was performed without and with the administration of 85 mL of iohexol  (OMNIPAQUE ) 350 MG/ML injection. Multiplanar reformatted images are provided for review. MIP images are provided for review. Automated exposure control, iterative reconstruction, and/or weight based adjustment of the mA/kV was utilized to reduce the radiation dose to as low as reasonably achievable. COMPARISON: Chest radiograph 06/17/2024. CLINICAL HISTORY: Dyspnea, chronic, chest wall or pleura disease suspected; acute SOB, DOE x 4 weeks, eval PE. FINDINGS: PULMONARY ARTERIES: Pulmonary arteries are adequately opacified for evaluation. No acute pulmonary embolus. Main pulmonary artery is normal in caliber.  MEDIASTINUM: The heart and pericardium demonstrate no acute abnormality. There is no acute abnormality of the thoracic aorta. LYMPH NODES: No mediastinal, hilar or axillary lymphadenopathy. LUNGS AND PLEURA: The lungs are without acute process. No focal consolidation or pulmonary edema. No evidence of pleural effusion or pneumothorax. UPPER ABDOMEN: Fatty liver. SOFT TISSUES AND BONES: Degenerative changes of the spine. No acute soft tissue abnormality. IMPRESSION: 1. No pulmonary embolism or acute pulmonary abnormality. Electronically signed by: Vanetta Chou MD 07/17/2024 02:43 PM EST RP Workstation: [id number]    Assessment & Plan:  .Tobacco abuse Assessment & Plan: Still smoking despite experiencing shortness of breath    Type 2 diabetes mellitus without complication, without long-term current use of insulin  (HCC)  Dyspnea and respiratory abnormalities Assessment & Plan: LIKELY MULTIFACTORIAL:  WORKUP FOR ISCHEMIA /CARDIOMYOPATHYTO COMMENCE WITH DR ARIDA ; PFTS AND SLEEP STUDY ORDERED  . IF OSA DIAGNOSED,  SHOULD HAVE ZIO MONITOR TO RULE OUT PAF.  SABRA FINALLY,  MORBID OBESITY LIKELY CONTRIBUTING.   COPD exacerbation (HCC) Assessment & Plan: Recent episode resolved.  She has quit smoking.  PFTs are pending    Hypothyroidism (acquired) Assessment & Plan: Thyroid  function is normal on current regimen of 112 mcg x 6 days and . 224 mcg x 1.     Lab Results  Component Value Date   TSH 2.91 07/17/2024      Morbid obesity (HCC) Assessment & Plan: Advised to consider restarting GLP 1 agonist. Her thyroid  biopsy was benign.    Other orders -     Celecoxib ; Take 1 capsule (200 mg total) by mouth 2 (two) times daily.  Dispense: 180 capsule; Refill: 0 -     Folic Acid ; Take 1 tablet (1 mg total) by mouth daily.  Dispense: 90 tablet; Refill: 2 -     Furosemide ; Take 1 tablet (20 mg total) by  mouth daily as needed. For fluid retention (Patient taking differently: Take 20 mg by mouth  daily. For fluid retention)  Dispense: 30 tablet; Refill: 0 -     Ergocalciferol ; Take 1 capsule (50,000 Units total) by mouth once a week. (Patient not taking: Reported on 07/31/2024)  Dispense: 12 capsule; Refill: 0 -     Levothyroxine  Sodium; Take 1 tablet (125 mcg total) by mouth daily.  Dispense: 90 tablet; Refill: 3     I spent 34 minutes on the day of this face to face encounter reviewing patient's  most recent visit with cardiology,  pulmonology, and orthopedics ,  relevant surgical and non surgical procedures, recent  labs and imaging studies, counseling on weight management,  reviewing the assessment and plan with patient, and post visit ordering and reviewing of  diagnostics and therapeutics with patient  .   Follow-up: Return in about 3 months (around 10/29/2024) for follow up diabetes.   Verneita LITTIE Kettering, MD "

## 2024-07-31 NOTE — Progress Notes (Signed)
 "    Cardiology Office Note   Date:  07/31/2024   ID:  Lisa Valencia, DOB 17-Jul-1961, MRN [id number]  PCP:  [name], MD  Cardiologist:   [name], MD   Chief Complaint  Patient presents with   New Patient (Initial Visit)    DOE/ wheezing and LE edema. Meds reviewed verbally with pt.      History of Present Illness: Lisa Valencia is a 64 y.o. female who was referred for evaluation of exertional dyspnea and possible heart failure. She has no prior cardiac history.  She has known history of hyperlipidemia, prediabetes, tobacco use and obesity.  She developed progressive shortness of breath, wheezing and edema starting in November she was placed on small dose of furosemide  and was seen by pulmonary.  She was given prednisone  and she gained 13 pounds and has not been able to lose that weight.  She had negative workup for lower extremity DVT.  She was seen in the ED and had CTA that was negative for pulmonary embolism.  She is scheduled for pulmonary function testing.  She describes substernal burning sensation in the chest with activities.  She is trying to quit smoking.  She suffers from low back pain and hip discomfort.    Past Medical History:  Diagnosis Date   Allergy 2003   Anxiety    Arthritis    Depression 2001   Diverticulosis of intestine 2010   Esophagitis, reflux 2010   GERD (gastroesophageal reflux disease)    Hepatic steatosis    HLD (hyperlipidemia)    HTN (hypertension)    Hypothyroidism    Impaired fasting glucose 01/25/2012   Needs eye exam.     Long-term use of aspirin  therapy    Pre-diabetes     Past Surgical History:  Procedure Laterality Date   ABDOMINAL HYSTERECTOMY  2009   supracervical    APPENDECTOMY  1985   BREAST BIOPSY Right 01/26/2022   papilloma   BREAST CYST ASPIRATION Right    rt [id number]   COLONOSCOPY WITH PROPOFOL  N/A 06/05/2015   Procedure: COLONOSCOPY WITH PROPOFOL ;  Surgeon: Rogelia Copping, MD;  Location: [facility];  Service: Endoscopy;  Laterality: N/A;  Pt requests early. Pt for antibiotic pre-procedure  CECAL PROTRUSION BX. / WITH CLIP LOT # [id number] RESOLUTION 360 EXP 03/03/2016 SIGMOID POLYP   JOINT REPLACEMENT Right 2012 2019   hip, [name]   KNEE ARTHROPLASTY Right 10/18/2023   Procedure: ARTHROPLASTY, KNEE, TOTAL, USING IMAGELESS COMPUTER-ASSISTED NAVIGATION;  Surgeon: Mardee Lynwood SQUIBB, MD;  Location: [facility];  Service: Orthopedics;  Laterality: Right;   THYROIDECTOMY     TOTAL HIP ARTHROPLASTY Left 05/02/2018   Procedure: TOTAL HIP ARTHROPLASTY;  Surgeon: Mardee Lynwood SQUIBB, MD;  Location: [facility];  Service: Orthopedics;  Laterality: Left;     Current Outpatient Medications  Medication Sig Dispense Refill   acetaminophen  (TYLENOL ) 500 MG tablet Take 1,000 mg by mouth every 6 (six) hours as needed for moderate pain (pain score 4-6).     albuterol  (VENTOLIN  HFA) 108 (90 Base) MCG/ACT inhaler Inhale 2 puffs into the lungs every 6 (six) hours as needed for wheezing or shortness of breath. 8 g 2   aspirin  EC 81 MG tablet Take 1 tablet (81 mg total) by mouth daily.     celecoxib  (CELEBREX ) 200 MG capsule Take 1 capsule (200 mg total) by mouth 2 (two) times daily. 180 capsule 0   cholecalciferol (VITAMIN D3) 25 MCG (1000 UNIT) tablet Take  2,000 Units by mouth once a week. Friday (Patient taking differently: Take 2,000 Units by mouth once a week.)     cyanocobalamin  (VITAMIN B12) 500 MCG tablet Take 500 mcg by mouth daily.     fexofenadine-pseudoephedrine (ALLEGRA-D 24) 180-240 MG 24 hr tablet Take 1 tablet by mouth at bedtime. (Patient taking differently: Take 1 tablet by mouth in the morning.)     folic acid  (FOLVITE ) 1 MG tablet Take 1 tablet (1 mg total) by mouth daily. 90 tablet 2   furosemide  (LASIX ) 20 MG tablet Take 1 tablet (20 mg total) by mouth daily as needed. For fluid retention (Patient taking differently: Take 20 mg by mouth daily. For fluid retention) 30 tablet 0   levothyroxine   (SYNTHROID ) 125 MCG tablet Take 1 tablet (125 mcg total) by mouth daily. 90 tablet 3   methocarbamol  (ROBAXIN ) 500 MG tablet Take 500 mg by mouth every 6 (six) hours as needed for muscle spasms.     nicotine  (NICODERM CQ  - DOSED IN MG/24 HOURS) 14 mg/24hr patch Place 1 patch (14 mg total) onto the skin daily for 14 days. 14 patch 0   nicotine  (NICODERM CQ  - DOSED IN MG/24 HR) 7 mg/24hr patch Place 1 patch (7 mg total) onto the skin daily for 14 days. 14 patch 0   nicotine  polacrilex (NICOTINE  MINI) 2 MG lozenge Take 1 lozenge (2 mg total) by mouth every 2 (two) hours as needed for smoking cessation. 72 lozenge 3   Omega-3 Fatty Acids (FISH OIL) 1000 MG CAPS Take 1,000 mg by mouth daily.      omeprazole  (PRILOSEC) 20 MG capsule TAKE 1 CAPSULE BY MOUTH TWICE DAILY BEFORE MEALS. 180 capsule 3   simvastatin  (ZOCOR ) 20 MG tablet TAKE 1 TABLET BY MOUTH AT BEDTIME 90 tablet 3   vitamin E 200 UNIT capsule Take 200 Units by mouth daily.     ergocalciferol  (DRISDOL ) 1.25 MG (50000 UT) capsule Take 1 capsule (50,000 Units total) by mouth once a week. (Patient not taking: Reported on 07/31/2024) 12 capsule 0   No current facility-administered medications for this visit.    Allergies:   Sulfa antibiotics, Chantix  [varenicline ], Tramadol , Codeine sulfate, Fenofibrate , Pseudoephedrine hcl, and Wellbutrin [bupropion]    Social History:  The patient  reports that she has been smoking cigarettes. She has a 8.8 pack-year smoking history. She has never used smokeless tobacco. She reports that she does not currently use alcohol. She reports that she does not use drugs.   Family History:  The patient's family history includes Breast cancer (age of onset: 2) in her paternal aunt; Cancer in her father, maternal grandfather, paternal aunt, paternal grandfather, and paternal grandmother; Coronary artery disease (age of onset: 31) in her sister; Diabetes in her maternal grandfather and sister; Hyperlipidemia in her father.     ROS:  Please see the history of present illness.   Otherwise, review of systems are positive for none.   All other systems are reviewed and negative.    PHYSICAL EXAM: VS:  BP 118/74 (BP Location: Right Arm, Cuff Size: Large)   Pulse 73   Ht 5' 11 (1.803 m)   Wt (!) 308 lb 8 oz (139.9 kg)   SpO2 98%   BMI 43.03 kg/m  , BMI Body mass index is 43.03 kg/m. GEN: Well nourished, well developed, in no acute distress  HEENT: normal  Neck: no  carotid bruits, or masses.  Mild JVD Cardiac: RRR; no murmurs, rubs, or gallops, mild bilateral lower  extremity edema Respiratory:  clear to auscultation bilaterally, normal work of breathing GI: soft, nontender, nondistended, + BS MS: no deformity or atrophy  Skin: warm and dry, no rash Neuro:  Strength and sensation are intact Psych: euthymic mood, full affect   EKG:  EKG is ordered today. The ekg ordered today demonstrates : Normal sinus rhythm Normal ECG When compared with ECG of 17-Jun-2024 10:27, No significant change was found    Recent Labs: 06/17/2024: Hemoglobin 13.8; Platelets 176; Pro Brain Natriuretic Peptide 58.6 07/17/2024: TSH 2.91 07/29/2024: ALT 31; BUN 14; Creatinine, Ser 0.70; Potassium 4.1; Sodium 141    Lipid Panel    Component Value Date/Time   CHOL 175 01/16/2024 0835   CHOL 248 (H) 05/17/2012 0804   TRIG 189.0 (H) 01/16/2024 0835   TRIG 238 (H) 05/17/2012 0804   HDL 44.50 01/16/2024 0835   HDL 40 05/17/2012 0804   CHOLHDL 4 01/16/2024 0835   VLDL 37.8 01/16/2024 0835   VLDL 48 (H) 05/17/2012 0804   LDLCALC 93 01/16/2024 0835   LDLCALC 119 (H) 12/28/2021 0937   LDLCALC 160 (H) 05/17/2012 0804   LDLDIRECT 118.0 06/22/2023 1021      Wt Readings from Last 3 Encounters:  07/31/24 (!) 308 lb 8 oz (139.9 kg)  07/31/24 (!) 308 lb 12.8 oz (140.1 kg)  07/30/24 (!) 310 lb 3.2 oz (140.7 kg)          07/31/2024   10:50 AM 07/31/2024    6:18 AM  PAD Screen  Previous PAD dx? No No   Previous surgical  procedure? No No   Pain with walking? No Yes   Subsides with rest? No Yes   Feet/toe relief with dangling? No Yes   Painful, non-healing ulcers? No No   Extremities discolored? No No      Manually entered by patient      ASSESSMENT AND PLAN:  1.  Shortness of breath and possible component of diastolic heart failure: There is evidence of mild volume overload.  Continue small dose of furosemide  for now.  I requested an echocardiogram for evaluation.  2.  Exertional chest pain described as burning sensation with minimal exertion.  She has multiple risk factors for coronary artery disease.  I requested cardiac CTA.  3.  Tobacco use: I discussed with her the importance of smoking cessation and she is trying to quit.  4.  Hyperlipidemia: Currently on simvastatin .  If established diagnosis of coronary artery disease, we will have to switch to a more potent statin.    Disposition:   FU with me in 2 months  Signed,  [name], MD  07/31/2024 11:02 AM    [facility] "

## 2024-07-31 NOTE — Patient Instructions (Addendum)
 Medication Instructions:  No changes *If you need a refill on your cardiac medications before your next appointment, please call your pharmacy*  Lab Work: None ordered If you have labs (blood work) drawn today and your tests are completely normal, you will receive your results only by: MyChart Message (if you have MyChart) OR A paper copy in the mail If you have any lab test that is abnormal or we need to change your treatment, we will call you to review the results.  Testing/Procedures: Your physician has requested that you have an echocardiogram. Echocardiography is a painless test that uses sound waves to create images of your heart. It provides your doctor with information about the size and shape of your heart and how well your hearts chambers and valves are working.   You may receive an ultrasound enhancing agent through an IV if needed to better visualize your heart during the echo. This procedure takes approximately one hour.  There are no restrictions for this procedure.  This will take place at [address] [facility], [place] [address]  Please note: We ask at that you not bring children with you during ultrasound (echo/ vascular) testing. Due to room size and safety concerns, children are not allowed in the ultrasound rooms during exams. Our front office staff cannot provide observation of children in our lobby area while testing is being conducted. An adult accompanying a patient to their appointment will only be allowed in the ultrasound room at the discretion of the ultrasound technician under special circumstances. We apologize for any inconvenience.   Follow-Up: At [facility], you and your health needs are our priority.  As part of our continuing mission to provide you with exceptional heart care, our providers are all part of one team.  This team includes your primary Cardiologist (physician) and Advanced Practice Providers or APPs  (Physician Assistants and Nurse Practitioners) who all work together to provide you with the care you need, when you need it.  Your next appointment:   2 month(s)  Provider:   You may see Dr. Darron or one of the following Advanced Practice Providers on your designated Care Team:   Lonni Meager, NP Lesley Maffucci, PA-C Bernardino Bring, PA-C Cadence Haralson, PA-C Tylene Lunch, NP Barnie Hila, NP    We recommend signing up for the patient portal called MyChart.  Sign up information is provided on this After Visit Summary.  MyChart is used to connect with patients for Virtual Visits (Telemedicine).  Patients are able to view lab/test results, encounter notes, upcoming appointments, etc.  Non-urgent messages can be sent to your provider as well.   To learn more about what you can do with MyChart, go to [email or web address].   Other Instructions   Your cardiac CT will be scheduled at one of the below locations:   [facility] [address] [place], [place] [address] [phone] (Severe contrast allergies only)  OR   [facility] [address] [place], [place] [address] [phone]  OR   [facility] [address] [place], [place] [address] [phone]  OR   [facility] V[facility] Tower [address]  [place], [place] [address]  OR   [facility] [address] [place], [place] [phone]  If scheduled at [facility], please arrive at the [facility] and Children's Entrance (Entrance C2) of [facility] 30 minutes prior to test start time. You can  use the FREE valet parking offered at entrance C (encouraged to control the heart rate for the test)  Proceed to the [facility] Radiology Department (first floor) to check-in and test prep.  All radiology patients and guests should use entrance C2 at [facility], accessed from [facility], even though the  hospital's physical address listed is [address]  If scheduled at the Heart and Vascular Tower at [address], please enter the parking lot using the [address] entrance and use the FREE valet service at the patient drop-off area. Enter the building and check-in with registration on the main floor.  If scheduled at [facility], please arrive to the Heart and Vascular Center 15 mins early for check-in and test prep.  There is spacious parking and easy access to the radiology department from the [facility] Heart and Vascular entrance. Please enter here and check-in with the desk attendant.   If scheduled at [facility], please arrive 30 minutes early for check-in and test prep.  Please follow these instructions carefully (unless otherwise directed):  An IV will be required for this test and Nitroglycerin will be given.   On the Night Before the Test: Be sure to Drink plenty of water . Do not consume any caffeinated/decaffeinated beverages or chocolate 12 hours prior to your test. Do not take any antihistamines 12 hours prior to your test.   On the Day of the Test: Drink plenty of water  until 1 hour prior to the test. Do not eat any food 1 hour prior to test. You may take your regular medications prior to the test.  Take metoprolol  (Lopressor ) two hours prior to test. If you take Furosemide /Hydrochlorothiazide /Spironolactone /Chlorthalidone, please HOLD on the morning of the test. Patients who wear a continuous glucose monitor MUST remove the device prior to scanning. FEMALES- please wear underwire-free bra if available, avoid dresses & tight clothing  After the Test: Drink plenty of water . After receiving IV contrast, you may experience a mild flushed feeling. This is normal. On occasion, you may experience a mild rash up to 24 hours after the test. This is not dangerous. If this occurs, you can take Benadryl  25 mg, Zyrtec, Claritin , or  Allegra and increase your fluid intake. (Patients taking Tikosyn should avoid Benadryl , and may take Zyrtec, Claritin , or Allegra) If you experience trouble breathing, this can be serious. If it is severe call 911 IMMEDIATELY. If it is mild, please call our office.  We will call to schedule your test 2-4 weeks out understanding that some insurance companies will need an authorization prior to the service being performed.   For more information and frequently asked questions, please visit our website : [email or web address]  For non-scheduling related questions, please contact the cardiac imaging nurse navigator should you have any questions/concerns: Cardiac Imaging Nurse Navigators Direct Office Dial: [phone]   For scheduling needs, including cancellations and rescheduling, please call Brittany, [phone].

## 2024-07-31 NOTE — Assessment & Plan Note (Signed)
 LIKELY MULTIFACTORIAL:  WORKUP FOR ISCHEMIA /CARDIOMYOPATHYTO COMMENCE WITH DR ARIDA ; PFTS AND SLEEP STUDY ORDERED  . IF OSA DIAGNOSED,  SHOULD HAVE ZIO MONITOR TO RULE OUT PAF.  SABRA FINALLY,  MORBID OBESITY LIKELY CONTRIBUTING.

## 2024-08-01 DIAGNOSIS — J441 Chronic obstructive pulmonary disease with (acute) exacerbation: Secondary | ICD-10-CM | POA: Insufficient documentation

## 2024-08-01 NOTE — Assessment & Plan Note (Signed)
 Advised to consider restarting GLP 1 agonist. Her thyroid  biopsy was benign.

## 2024-08-01 NOTE — Assessment & Plan Note (Signed)
 Thyroid  function is normal on current regimen of 112 mcg x 6 days and . 224 mcg x 1.     Lab Results  Component Value Date   TSH 2.91 07/17/2024

## 2024-08-01 NOTE — Assessment & Plan Note (Addendum)
 Recent episode resolved.  She has quit smoking.  PFTs are pending

## 2024-08-02 LAB — LIPID PANEL WITH LDL/HDL RATIO

## 2024-08-03 LAB — LIPID PANEL WITH LDL/HDL RATIO
Cholesterol, Total: 201 mg/dL — ABNORMAL HIGH (ref 100–199)
HDL: 48 mg/dL
LDL Chol Calc (NIH): 124 mg/dL — ABNORMAL HIGH (ref 0–99)
LDL/HDL Ratio: 2.6 ratio (ref 0.0–3.2)
Triglycerides: 162 mg/dL — ABNORMAL HIGH (ref 0–149)
VLDL Cholesterol Cal: 29 mg/dL (ref 5–40)

## 2024-08-03 LAB — THYROID PANEL WITH TSH
Free Thyroxine Index: 2.8 (ref 1.2–4.9)
T3 Uptake Ratio: 29 % (ref 24–39)
T4, Total: 9.8 ug/dL (ref 4.5–12.0)
TSH: 3.76 u[IU]/mL (ref 0.450–4.500)

## 2024-08-03 LAB — SPECIMEN STATUS REPORT

## 2024-08-04 ENCOUNTER — Ambulatory Visit: Payer: Self-pay | Admitting: Internal Medicine

## 2024-08-04 MED ORDER — SIMVASTATIN 40 MG PO TABS: 20.0000 mg | ORAL_TABLET | Freq: Every day | ORAL | 1 refills | Status: DC

## 2024-08-04 MED ORDER — ERGOCALCIFEROL 1.25 MG (50000 UT) PO CAPS: 50000.0000 [IU] | ORAL_CAPSULE | ORAL | 0 refills | Status: AC

## 2024-08-05 ENCOUNTER — Other Ambulatory Visit: Payer: Self-pay | Admitting: Internal Medicine

## 2024-08-06 ENCOUNTER — Encounter: Payer: Self-pay | Admitting: Internal Medicine

## 2024-08-06 ENCOUNTER — Ambulatory Visit: Admitting: Sleep Medicine

## 2024-08-06 ENCOUNTER — Encounter: Payer: Self-pay | Admitting: Sleep Medicine

## 2024-08-06 VITALS — BP 142/92 | HR 79 | Temp 97.8°F | Ht 71.0 in | Wt 309.4 lb

## 2024-08-06 DIAGNOSIS — G4710 Hypersomnia, unspecified: Secondary | ICD-10-CM

## 2024-08-06 DIAGNOSIS — G4733 Obstructive sleep apnea (adult) (pediatric): Secondary | ICD-10-CM

## 2024-08-06 DIAGNOSIS — Z6841 Body Mass Index (BMI) 40.0 and over, adult: Secondary | ICD-10-CM | POA: Diagnosis not present

## 2024-08-06 DIAGNOSIS — F17210 Nicotine dependence, cigarettes, uncomplicated: Secondary | ICD-10-CM | POA: Diagnosis not present

## 2024-08-06 NOTE — Patient Instructions (Signed)
 SABRA

## 2024-08-06 NOTE — Progress Notes (Signed)
 "      Name:Audriella LEZLEY BEDGOOD MRN: [id number] DOB: 1961/03/14   CHIEF COMPLAINT:  EXCESSIVE DAYTIME SLEEPINESS   HISTORY OF PRESENT ILLNESS: Ms. Mahadeo is a 64 y.o. w/ a h/o hypothyroidism, hyperlipidemia and morbid obesity who present for c/o loud snoring and excessive daytime sleepiness which has been present for several years. Reports nocturnal awakenings due to nocturia and has difficulty falling back to sleep. Reports significant weight changes. Admits to morning headaches, dry mouth and night sweats. Denies RLS symptoms, dream enactment, cataplexy, hypnagogic or hypnapompic hallucinations. Reports a family history of sleep apnea. Denies drowsy driving. Drinks 1-2 coffee and 2 glasses of tea daily, denies alcohol or illicit drug use. Smokes 4 cigarettes daily.   Bedtime 10-11 pm Sleep onset 10 mins Rise time 3:30-4 am   EPWORTH SLEEP SCORE 15    08/06/2024    9:20 AM  Results of the Epworth flowsheet  Sitting and reading 3  Watching TV 3  Sitting, inactive in a public place (e.g. a theatre or a meeting) 1  As a passenger in a car for an hour without a break 2  Lying down to rest in the afternoon when circumstances permit 3  Sitting and talking to someone 0  Sitting quietly after a lunch without alcohol 3  In a car, while stopped for a few minutes in traffic 0  Total score 15    PAST MEDICAL HISTORY :   has a past medical history of Allergy (2003), Anxiety, Arthritis, Depression (2001), Diverticulosis of intestine (2010), Esophagitis, reflux (2010), GERD (gastroesophageal reflux disease), Hepatic steatosis, HLD (hyperlipidemia), HTN (hypertension), Hypothyroidism, Impaired fasting glucose (01/25/2012), Long-term use of aspirin  therapy, and Pre-diabetes.  has a past surgical history that includes Appendectomy (1985); Abdominal hysterectomy (2009); Colonoscopy with propofol  (N/A, 06/05/2015); Joint replacement (Right, 2012 2019); Total hip arthroplasty (Left, 05/02/2018); Breast  cyst aspiration (Right); Breast biopsy (Right, 01/26/2022); Thyroidectomy; and Knee Arthroplasty (Right, 10/18/2023). Prior to Admission medications  Medication Sig Start Date End Date Taking? Authorizing Provider  acetaminophen  (TYLENOL ) 500 MG tablet Take 1,000 mg by mouth every 6 (six) hours as needed for moderate pain (pain score 4-6).   Yes [provider]  albuterol  (VENTOLIN  HFA) 108 (90 Base) MCG/ACT inhaler Inhale 2 puffs into the lungs every 6 (six) hours as needed for wheezing or shortness of breath. 06/17/24  Yes Arlander Charleston, MD  aspirin  EC 81 MG tablet Take 1 tablet (81 mg total) by mouth daily. 10/19/23  Yes Drake Chew, PA-C  celecoxib  (CELEBREX ) 200 MG capsule Take 1 capsule (200 mg total) by mouth 2 (two) times daily. 07/31/24  Yes [name], MD  cholecalciferol (VITAMIN D3) 25 MCG (1000 UNIT) tablet Take 2,000 Units by mouth once a week. Friday Patient taking differently: Take 2,000 Units by mouth once a week.   Yes [provider]  cyanocobalamin  (VITAMIN B12) 500 MCG tablet Take 500 mcg by mouth daily.   Yes [provider]  ergocalciferol  (DRISDOL ) 1.25 MG (50000 UT) capsule Take 1 capsule (50,000 Units total) by mouth once a week. 08/04/24  Yes Tullo, Teresa L, MD  fexofenadine-pseudoephedrine (ALLEGRA-D 24) 180-240 MG 24 hr tablet Take 1 tablet by mouth at bedtime. Patient taking differently: Take 1 tablet by mouth in the morning.   Yes [provider]  folic acid  (FOLVITE ) 1 MG tablet Take 1 tablet (1 mg total) by mouth daily. 07/31/24  Yes [name], MD  furosemide  (LASIX ) 20 MG tablet Take 1 tablet (20  mg total) by mouth daily as needed. For fluid retention Patient taking differently: Take 20 mg by mouth daily. For fluid retention 07/31/24  Yes [name], MD  levothyroxine  (SYNTHROID ) 125 MCG tablet Take 1 tablet (125 mcg total) by mouth daily. 07/31/24  Yes [name], MD  methocarbamol  (ROBAXIN ) 500 MG tablet Take  500 mg by mouth every 6 (six) hours as needed for muscle spasms.   Yes [provider]  metoprolol  tartrate (LOPRESSOR ) 50 MG tablet Take one tablet two hours prior to the test 07/31/24  Yes Darron Deatrice LABOR, MD  nicotine  (NICODERM CQ  - DOSED IN MG/24 HOURS) 14 mg/24hr patch Place 1 patch (14 mg total) onto the skin daily for 14 days. 07/30/24 08/13/24 Yes Dgayli, Belva, MD  nicotine  (NICODERM CQ  - DOSED IN MG/24 HR) 7 mg/24hr patch Place 1 patch (7 mg total) onto the skin daily for 14 days. 07/30/24 08/13/24 Yes Dgayli, Belva, MD  nicotine  polacrilex (NICOTINE  MINI) 2 MG lozenge Take 1 lozenge (2 mg total) by mouth every 2 (two) hours as needed for smoking cessation. 07/30/24 10/28/24 Yes Dgayli, Belva, MD  Omega-3 Fatty Acids (FISH OIL) 1000 MG CAPS Take 1,000 mg by mouth daily.    Yes [provider]  omeprazole  (PRILOSEC) 20 MG capsule TAKE 1 CAPSULE BY MOUTH TWICE DAILY BEFORE MEALS. 12/21/23  Yes [name], MD  simvastatin  (ZOCOR ) 40 MG tablet Take 0.5 tablets (20 mg total) by mouth at bedtime. 08/04/24  Yes Tullo, Teresa L, MD  vitamin E 200 UNIT capsule Take 200 Units by mouth daily.   Yes [provider]   Allergies[1]  FAMILY HISTORY:  family history includes Breast cancer (age of onset: 28) in her paternal aunt; Cancer in her father, maternal grandfather, paternal aunt, paternal grandfather, and paternal grandmother; Coronary artery disease (age of onset: 2) in her sister; Diabetes in her maternal grandfather and sister; Hyperlipidemia in her father. SOCIAL HISTORY:  reports that she has been smoking cigarettes. She has a 8.8 pack-year smoking history. She has never used smokeless tobacco. She reports that she does not currently use alcohol. She reports that she does not use drugs.   Review of Systems:  Gen:  Denies  fever, sweats, chills weight loss  HEENT: Denies blurred vision, double vision, ear pain, eye pain, hearing loss, nose bleeds, sore  throat Cardiac:  No dizziness, chest pain or heaviness, chest tightness,edema, No JVD Resp:   No cough, -sputum production, -shortness of breath,-wheezing, -hemoptysis,  Gi: Denies swallowing difficulty, stomach pain, nausea or vomiting, diarrhea, constipation, bowel incontinence Gu:  Denies bladder incontinence, burning urine Ext:   Denies Joint pain, stiffness or swelling Skin: Denies  skin rash, easy bruising or bleeding or hives Endoc:  Denies polyuria, polydipsia , polyphagia or weight change Psych:   Denies depression, insomnia or hallucinations  Other:  All other systems negative  VITAL SIGNS: BP (!) 142/92   Pulse 79   Temp 97.8 F (36.6 C)   Ht 5' 11 (1.803 m)   Wt (!) 309 lb 6.4 oz (140.3 kg)   SpO2 96%   BMI 43.15 kg/m    Physical Examination:   General Appearance: No distress  EYES PERRLA, EOM intact.   NECK Supple, No JVD Pulmonary: normal breath sounds, No wheezing.  CardiovascularNormal S1,S2.  No m/r/g.   Abdomen: Benign, Soft, non-tender. Skin:   warm, no rashes, no ecchymosis  Extremities: normal, no cyanosis, clubbing. Neuro:without focal findings,  speech normal  PSYCHIATRIC:  Mood, affect within normal limits.   ASSESSMENT AND PLAN  OSA I suspect that OSA is likely present due to clinical presentation. Discussed the consequences of untreated sleep apnea. Advised not to drive drowsy for safety of patient and others. Will complete further evaluation with a home sleep study and follow up to review results.    Morbid obesity Counseled patient on diet and lifestyle modification.    MEDICATION ADJUSTMENTS/LABS AND TESTS ORDERED: Recommend Sleep Study   Patient  satisfied with Plan of action and management. All questions answered  Follow up to review HST results and treatment plan.   I spent a total of 45 minutes reviewing chart data, face-to-face evaluation with the patient, counseling and coordination of care as detailed above.    Shantara Goosby, M.D.  Sleep Medicine [facility]           [1]  Allergies Allergen Reactions   Sulfa Antibiotics Swelling and Other (See Comments)    Angioedema THROAT AND TONGUE HAD SWELLING   Chantix  [Varenicline ] Nausea Only   Tramadol  Itching   Codeine Sulfate Nausea Only   Fenofibrate  Other (See Comments)    Muscle aches   Pseudoephedrine Hcl Palpitations   Wellbutrin [Bupropion] Hives   "

## 2024-08-08 ENCOUNTER — Encounter (HOSPITAL_COMMUNITY): Payer: Self-pay

## 2024-08-08 MED ORDER — FUROSEMIDE 20 MG PO TABS: 20.0000 mg | ORAL_TABLET | Freq: Every day | ORAL | 1 refills | Status: AC | PRN

## 2024-08-08 MED ORDER — SIMVASTATIN 40 MG PO TABS: 40.0000 mg | ORAL_TABLET | Freq: Every day | ORAL | 1 refills | Status: DC

## 2024-08-09 ENCOUNTER — Telehealth (HOSPITAL_COMMUNITY): Payer: Self-pay | Admitting: *Deleted

## 2024-08-09 NOTE — Telephone Encounter (Signed)
 Attempted to call patient regarding upcoming cardiac CT appointment. Left message on voicemail with name and callback number Sid Seats RN Navigator Cardiac Imaging [facility] Heart and Vascular Services [phone] Office

## 2024-08-09 NOTE — Telephone Encounter (Signed)
 Noted.

## 2024-08-12 ENCOUNTER — Ambulatory Visit: Admission: RE | Admit: 2024-08-12 | Source: Ambulatory Visit

## 2024-08-15 ENCOUNTER — Encounter: Payer: Self-pay | Admitting: Cardiovascular Disease

## 2024-08-15 ENCOUNTER — Ambulatory Visit: Attending: Cardiovascular Disease

## 2024-08-15 ENCOUNTER — Ambulatory Visit: Payer: Self-pay | Admitting: Cardiovascular Disease

## 2024-08-15 DIAGNOSIS — R0689 Other abnormalities of breathing: Secondary | ICD-10-CM

## 2024-08-15 DIAGNOSIS — Z79899 Other long term (current) drug therapy: Secondary | ICD-10-CM

## 2024-08-15 DIAGNOSIS — R0609 Other forms of dyspnea: Secondary | ICD-10-CM

## 2024-08-15 DIAGNOSIS — E782 Mixed hyperlipidemia: Secondary | ICD-10-CM

## 2024-08-15 LAB — ECHOCARDIOGRAM COMPLETE
AR max vel: 2.43 cm2
AV Area VTI: 2.52 cm2
AV Area mean vel: 2.35 cm2
AV Mean grad: 7 mmHg
AV Peak grad: 13.4 mmHg
Ao pk vel: 1.83 m/s
Area-P 1/2: 3.17 cm2

## 2024-08-15 NOTE — Telephone Encounter (Signed)
 Yes, okay to increase Lasix  to 40 mg once daily.  Check basic metabolic profile in 1 week to ensure stable renal function and electrolytes. Her echo looked fine.

## 2024-08-20 ENCOUNTER — Encounter: Payer: Self-pay | Admitting: Internal Medicine

## 2024-08-20 MED ORDER — LEVOTHYROXINE SODIUM 125 MCG PO TABS: 125.0000 ug | ORAL_TABLET | Freq: Every day | ORAL | 3 refills | Status: AC

## 2024-08-20 MED ORDER — CELECOXIB 200 MG PO CAPS: 200.0000 mg | ORAL_CAPSULE | Freq: Two times a day (BID) | ORAL | 0 refills | Status: AC

## 2024-08-21 ENCOUNTER — Telehealth (HOSPITAL_COMMUNITY): Payer: Self-pay | Admitting: Emergency Medicine

## 2024-08-21 ENCOUNTER — Encounter (HOSPITAL_COMMUNITY): Payer: Self-pay

## 2024-08-21 NOTE — Telephone Encounter (Signed)
 Reaching out to patient to offer assistance regarding upcoming cardiac imaging study; pt verbalizes understanding of appt date/time, parking situation and where to check in, pre-test NPO status and medications ordered, and verified current allergies; name and call back number provided for further questions should they arise Rockwell Alexandria RN Navigator Cardiac Imaging [facility] [phone] office [phone] cell

## 2024-08-22 ENCOUNTER — Ambulatory Visit

## 2024-08-22 ENCOUNTER — Encounter: Payer: Self-pay | Admitting: Cardiovascular Disease

## 2024-08-22 ENCOUNTER — Other Ambulatory Visit: Payer: Self-pay

## 2024-08-22 DIAGNOSIS — Z79899 Other long term (current) drug therapy: Secondary | ICD-10-CM

## 2024-08-22 DIAGNOSIS — R072 Precordial pain: Secondary | ICD-10-CM

## 2024-08-22 MED ORDER — IOHEXOL 350 MG/ML SOLN
100.0000 mL | Freq: Once | INTRAVENOUS | Status: AC | PRN
  Administered 2024-08-22: 100 mL via INTRAVENOUS

## 2024-08-22 MED ORDER — NITROGLYCERIN 0.4 MG SL SUBL
0.8000 mg | SUBLINGUAL_TABLET | Freq: Once | SUBLINGUAL | Status: AC
  Administered 2024-08-22: 0.8 mg via SUBLINGUAL
  Filled 2024-08-22: qty 25

## 2024-08-22 NOTE — Progress Notes (Signed)
 Patient tolerated procedure well. W/C to lobby.  Ambulate w/o difficulty. Denies light headedness or being dizzy. Encouraged to drink extra water today and reasoning explained. Verbalized understanding. All questions answered. ABC intact. No further needs. Discharge from procedure area w/o issues.

## 2024-08-23 LAB — BASIC METABOLIC PANEL WITH GFR
BUN/Creatinine Ratio: 18 (ref 12–28)
BUN: 13 mg/dL (ref 8–27)
CO2: 27 mmol/L (ref 20–29)
Calcium: 9.2 mg/dL (ref 8.7–10.3)
Chloride: 101 mmol/L (ref 96–106)
Creatinine, Ser: 0.73 mg/dL (ref 0.57–1.00)
Glucose: 93 mg/dL (ref 70–99)
Potassium: 4.6 mmol/L (ref 3.5–5.2)
Sodium: 144 mmol/L (ref 134–144)
eGFR: 92 mL/min/{1.73_m2}

## 2024-08-23 MED ORDER — ROSUVASTATIN CALCIUM 20 MG PO TABS: 20.0000 mg | ORAL_TABLET | Freq: Every day | ORAL | 2 refills | Status: AC

## 2024-08-23 NOTE — Assessment & Plan Note (Signed)
 cardiac CTA showed moderately elevated calcium  score at 312;  cardiology has recommend change in statin to rosuvastatin   and follow up with Arida as planned

## 2024-09-23 ENCOUNTER — Encounter

## 2024-10-01 ENCOUNTER — Ambulatory Visit: Admitting: Cardiovascular Disease

## 2024-10-09 ENCOUNTER — Encounter

## 2024-10-21 ENCOUNTER — Ambulatory Visit: Admitting: Student in an Organized Health Care Education/Training Program

## 2024-10-29 ENCOUNTER — Ambulatory Visit: Admitting: Internal Medicine
